# Patient Record
Sex: Male | Born: 1950 | Race: White | Hispanic: No | Marital: Married | Smoking: Former smoker
Health system: Southern US, Community
[De-identification: ages and names within clinical notes are randomized; demographics above are authoritative.]

## PROBLEM LIST (undated history)

## (undated) DIAGNOSIS — Z86010 Personal history of colonic polyps: Secondary | ICD-10-CM

## (undated) DIAGNOSIS — E785 Hyperlipidemia, unspecified: Secondary | ICD-10-CM

## (undated) DIAGNOSIS — N281 Cyst of kidney, acquired: Secondary | ICD-10-CM

## (undated) DIAGNOSIS — M1990 Unspecified osteoarthritis, unspecified site: Secondary | ICD-10-CM

## (undated) DIAGNOSIS — N401 Enlarged prostate with lower urinary tract symptoms: Secondary | ICD-10-CM

## (undated) DIAGNOSIS — R0600 Dyspnea, unspecified: Secondary | ICD-10-CM

## (undated) DIAGNOSIS — F329 Major depressive disorder, single episode, unspecified: Secondary | ICD-10-CM

## (undated) DIAGNOSIS — Q681 Congenital deformity of finger(s) and hand: Secondary | ICD-10-CM

## (undated) DIAGNOSIS — F32A Depression, unspecified: Secondary | ICD-10-CM

## (undated) DIAGNOSIS — N138 Other obstructive and reflux uropathy: Secondary | ICD-10-CM

## (undated) DIAGNOSIS — R3914 Feeling of incomplete bladder emptying: Secondary | ICD-10-CM

## (undated) DIAGNOSIS — J449 Chronic obstructive pulmonary disease, unspecified: Secondary | ICD-10-CM

## (undated) DIAGNOSIS — K5140 Inflammatory polyps of colon without complications: Secondary | ICD-10-CM

## (undated) DIAGNOSIS — Z973 Presence of spectacles and contact lenses: Secondary | ICD-10-CM

## (undated) DIAGNOSIS — F419 Anxiety disorder, unspecified: Secondary | ICD-10-CM

## (undated) DIAGNOSIS — R319 Hematuria, unspecified: Secondary | ICD-10-CM

## (undated) DIAGNOSIS — N183 Chronic kidney disease, stage 3 unspecified: Secondary | ICD-10-CM

## (undated) DIAGNOSIS — D649 Anemia, unspecified: Secondary | ICD-10-CM

## (undated) DIAGNOSIS — I10 Essential (primary) hypertension: Secondary | ICD-10-CM

## (undated) DIAGNOSIS — Z860101 Personal history of adenomatous and serrated colon polyps: Secondary | ICD-10-CM

## (undated) DIAGNOSIS — C679 Malignant neoplasm of bladder, unspecified: Secondary | ICD-10-CM

## (undated) HISTORY — DX: Depression, unspecified: F32.A

## (undated) HISTORY — DX: Benign prostatic hyperplasia with lower urinary tract symptoms: N13.8

## (undated) HISTORY — PX: NO PAST SURGERIES: SHX2092

## (undated) HISTORY — DX: Hyperlipidemia, unspecified: E78.5

## (undated) HISTORY — PX: COLONOSCOPY WITH PROPOFOL: SHX5780

## (undated) HISTORY — DX: Benign prostatic hyperplasia with lower urinary tract symptoms: N40.1

## (undated) HISTORY — DX: Anxiety disorder, unspecified: F41.9

## (undated) HISTORY — DX: Inflammatory polyps of colon without complications: K51.40

## (undated) HISTORY — DX: Major depressive disorder, single episode, unspecified: F32.9

## (undated) HISTORY — DX: Essential (primary) hypertension: I10

---

## 1998-09-09 ENCOUNTER — Emergency Department (HOSPITAL_COMMUNITY): Admission: EM | Admit: 1998-09-09 | Discharge: 1998-09-09 | Payer: Self-pay | Admitting: Emergency Medicine

## 2004-10-16 ENCOUNTER — Ambulatory Visit: Payer: Self-pay | Admitting: Family Medicine

## 2004-12-17 ENCOUNTER — Ambulatory Visit: Payer: Self-pay | Admitting: Family Medicine

## 2004-12-19 ENCOUNTER — Ambulatory Visit: Payer: Self-pay | Admitting: Internal Medicine

## 2005-01-08 ENCOUNTER — Ambulatory Visit: Payer: Self-pay | Admitting: Internal Medicine

## 2005-01-17 ENCOUNTER — Ambulatory Visit: Payer: Self-pay | Admitting: Internal Medicine

## 2005-01-18 ENCOUNTER — Ambulatory Visit: Payer: Self-pay | Admitting: Internal Medicine

## 2005-02-21 ENCOUNTER — Ambulatory Visit: Payer: Self-pay | Admitting: Family Medicine

## 2016-11-08 ENCOUNTER — Ambulatory Visit: Payer: Self-pay | Admitting: Adult Health

## 2016-11-15 ENCOUNTER — Encounter: Payer: Self-pay | Admitting: Internal Medicine

## 2016-11-15 ENCOUNTER — Encounter: Payer: Self-pay | Admitting: Adult Health

## 2016-11-15 ENCOUNTER — Ambulatory Visit (INDEPENDENT_AMBULATORY_CARE_PROVIDER_SITE_OTHER): Payer: Medicare Other | Admitting: Adult Health

## 2016-11-15 VITALS — Temp 98.6°F | Wt 130.7 lb

## 2016-11-15 DIAGNOSIS — L918 Other hypertrophic disorders of the skin: Secondary | ICD-10-CM

## 2016-11-15 DIAGNOSIS — I10 Essential (primary) hypertension: Secondary | ICD-10-CM | POA: Diagnosis not present

## 2016-11-15 DIAGNOSIS — Z23 Encounter for immunization: Secondary | ICD-10-CM | POA: Diagnosis not present

## 2016-11-15 DIAGNOSIS — F17210 Nicotine dependence, cigarettes, uncomplicated: Secondary | ICD-10-CM

## 2016-11-15 DIAGNOSIS — Z1211 Encounter for screening for malignant neoplasm of colon: Secondary | ICD-10-CM

## 2016-11-15 DIAGNOSIS — N400 Enlarged prostate without lower urinary tract symptoms: Secondary | ICD-10-CM | POA: Diagnosis not present

## 2016-11-15 DIAGNOSIS — Z7689 Persons encountering health services in other specified circumstances: Secondary | ICD-10-CM | POA: Diagnosis not present

## 2016-11-15 DIAGNOSIS — Z720 Tobacco use: Secondary | ICD-10-CM | POA: Insufficient documentation

## 2016-11-15 MED ORDER — VARENICLINE TARTRATE 1 MG PO TABS: 1.0000 mg | ORAL_TABLET | Freq: Two times a day (BID) | ORAL | 3 refills | Status: DC

## 2016-11-15 MED ORDER — TAMSULOSIN HCL 0.4 MG PO CAPS: 0.4000 mg | ORAL_CAPSULE | Freq: Every day | ORAL | 3 refills | Status: DC

## 2016-11-15 MED ORDER — FLUTICASONE FUROATE-VILANTEROL 100-25 MCG/INH IN AEPB: 1.0000 | INHALATION_SPRAY | Freq: Every day | RESPIRATORY_TRACT | 11 refills | Status: DC

## 2016-11-15 MED ORDER — LISINOPRIL 20 MG PO TABS: 20.0000 mg | ORAL_TABLET | Freq: Every day | ORAL | 3 refills | Status: DC

## 2016-11-15 MED ORDER — VARENICLINE TARTRATE 0.5 MG X 11 & 1 MG X 42 PO MISC: ORAL | 0 refills | Status: DC

## 2016-11-15 NOTE — Progress Notes (Addendum)
Patient presents to clinic today to establish care. He is a pleasant 66 year old male who  has a past medical history of Depression and Inflammatory polyps of colon ([place]).  He has not been seen by a medical profession for the last 12 years  Acute Concerns: Establish Care   He has a skin tag on his right index finger that he would like removed as it is irritated when he is dressing, bathing and working   Chronic Issues: Depression - he has never taken anything for depression. He does not feel like it is well controlled. He reports that he does not care about the things he used to care about it. He denies any SI.   LUTS associated with BPH - feels as though he is getting up to go the bathroom throughout the night. His stream is not strong and he is having incontinence    Hypertension - He has never been diagnosed with hypertension in the past. Blood pressure is 172/92 on two separate checkes today   Tobacco Use - He smokes about 1/4 pack per day. He is interested in using Chantix. He also complains of feeling short of breath due to smoking. Shortness of breath is more pronounced at night   Health Maintenance: Dental -- Does not do routine care  Vision -- Does not do routine care  Immunizations -- Not UTD  Colonoscopy -- 2006     Past Medical History:  Diagnosis Date  . Depression   . Inflammatory polyps of colon ([place])     No past surgical history on file.  No current outpatient prescriptions on file prior to visit.   No current facility-administered medications on file prior to visit.     Allergies  Allergen Reactions  . Penicillins     Family History  Problem Relation Age of Onset  . Hypertension Mother   . Stroke Mother   . Heart disease Father   . Cancer Sister   . Cancer Brother     Social History   Social History  . Marital status: Married    Spouse name: N/A  . Number of children: N/A  . Years of education: N/A   Occupational History  . Not on  file.   Social History Main Topics  . Smoking status: Current Every Day Smoker  . Smokeless tobacco: Never Used  . Alcohol use No  . Drug use: No  . Sexual activity: Not on file   Other Topics Concern  . Not on file   Social History Narrative  . No narrative on file    Review of Systems  Constitutional: Negative.   HENT: Negative.   Eyes: Negative.   Respiratory: Positive for shortness of breath and wheezing.   Cardiovascular: Negative.   Gastrointestinal: Negative.   Genitourinary: Positive for urgency.       Incontinence and decreased stream    Musculoskeletal: Negative.   Skin:       Skin tag on right index finger   Neurological: Negative.   Endo/Heme/Allergies: Negative.   Psychiatric/Behavioral: Positive for depression.    Temp 98.6 F (37 C) (Oral)   Wt 130 lb 11.2 oz (59.3 kg)   Physical Exam  Constitutional: He is oriented to person, place, and time and well-developed, well-nourished, and in no distress. No distress.  HENT:  Head: Normocephalic and atraumatic.  Right Ear: External ear normal.  Left Ear: External ear normal.  Nose: Nose normal.  Mouth/Throat: Oropharynx is clear and moist.  No oropharyngeal exudate.  Eyes: Conjunctivae and EOM are normal. Pupils are equal, round, and reactive to light. Right eye exhibits no discharge. Left eye exhibits no discharge. No scleral icterus.  Neck: Normal range of motion. Neck supple. No JVD present. No tracheal deviation present. No thyromegaly present.  Cardiovascular: Normal rate, regular rhythm, normal heart sounds and intact distal pulses.  Exam reveals no gallop and no friction rub.   No murmur heard. Pulmonary/Chest: Effort normal. No stridor. No respiratory distress. He has wheezes (trace throughout ). He has no rales. He exhibits no tenderness.  Congenital deformity of chest wall   Abdominal: Soft. Bowel sounds are normal. He exhibits no distension and no mass. There is no tenderness. There is no rebound  and no guarding.  Musculoskeletal: Normal range of motion. He exhibits no edema, tenderness or deformity.  Congenital deformity of left hand   Lymphadenopathy:    He has no cervical adenopathy.  Neurological: He is alert and oriented to person, place, and time. He displays normal reflexes. No cranial nerve deficit. He exhibits normal muscle tone. Coordination normal. GCS score is 15.  Skin: Skin is warm and dry. No rash noted. He is not diaphoretic. No erythema. No pallor.  Skin tag on right index finger.   Psychiatric: Mood, memory, affect and judgment normal.  Nursing note and vitals reviewed.  Assessment/Plan: 1. Encounter to establish care - Follow up in one month for CPE  - Follow up sooner if needed  2. Cigarette nicotine dependence without complication  - varenicline (CHANTIX CONTINUING MONTH PAK) 1 MG tablet; Take 1 tablet (1 mg total) by mouth 2 (two) times daily.  Dispense: 60 tablet; Refill: 3 - varenicline (CHANTIX STARTING MONTH PAK) 0.5 MG X 11 & 1 MG X 42 tablet; Take one 0.5 mg tablet by mouth once daily for 3 days, then increase to one 0.5 mg tablet twice daily for 4 days, then increase to one 1 mg tablet twice daily.  Dispense: 53 tablet; Refill: 0 Trial of chantix. Common side effects including rare risk of suicide ideation was discussed with the patient today.  Patient is instructed to go directly to the ED if this occurs.  We discussed that patient can continue to smoke for 1 week after starting chantix, but then must discontinue cigarettes.  He is also instructed to contact us prior to completion of the starter month pack for an rx for the continuation month pack.  5 minutes spent with patient today on tobacco cessation counseling.   - fluticasone furoate-vilanterol (BREO ELLIPTA) 100-25 MCG/INH AEPB; Inhale 1 puff into the lungs daily.  Dispense: 28 each; Refill: 11  3. Colon cancer screening  - Ambulatory referral to Gastroenterology  4. Essential hypertension -  Will start on Lisinopril 20 mg daily - lisinopril (PRINIVIL,ZESTRIL) 20 MG tablet; Take 1 tablet (20 mg total) by mouth daily.  Dispense: 90 tablet; Refill: 3 - Follow up in one month  5. Skin tag  0.4 mm Skin tag was  snipped off using Betadine for cleansing and sterile iris scissors. Local anesthesia was  used. These pathognomonic lesions are not sent for pathology.   6. Need for vaccination against Streptococcus pneumoniae using pneumococcal conjugate vaccine 13  - Pneumococcal conjugate vaccine 13-valent IM  7. BPH without urinary obstruction  - tamsulosin (FLOMAX) 0.4 MG CAPS capsule; Take 1 capsule (0.4 mg total) by mouth daily.  Dispense: 90 capsule; Refill: 3   Dorothyann Peng, NP

## 2016-11-15 NOTE — Patient Instructions (Addendum)
It was great meeting you today   1. I have sent in a prescription for lisinopril - for blood pressure. Take daily  2. I have sent in a prescription for Chantix - take this daily as directed 3. I have sent in a prescription for Breo Ellipta - use daily for shortness of breath   Please follow up with me in 1 month for your physical

## 2016-12-17 ENCOUNTER — Encounter: Payer: Self-pay | Admitting: Adult Health

## 2016-12-17 ENCOUNTER — Ambulatory Visit (INDEPENDENT_AMBULATORY_CARE_PROVIDER_SITE_OTHER): Payer: Medicare Other | Admitting: Adult Health

## 2016-12-17 ENCOUNTER — Other Ambulatory Visit: Payer: Self-pay | Admitting: Family Medicine

## 2016-12-17 VITALS — BP 164/74 | Temp 98.0°F | Ht 64.0 in | Wt 131.0 lb

## 2016-12-17 DIAGNOSIS — F32A Depression, unspecified: Secondary | ICD-10-CM | POA: Insufficient documentation

## 2016-12-17 DIAGNOSIS — Z0001 Encounter for general adult medical examination with abnormal findings: Secondary | ICD-10-CM | POA: Diagnosis not present

## 2016-12-17 DIAGNOSIS — Z125 Encounter for screening for malignant neoplasm of prostate: Secondary | ICD-10-CM

## 2016-12-17 DIAGNOSIS — I10 Essential (primary) hypertension: Secondary | ICD-10-CM

## 2016-12-17 DIAGNOSIS — Z0000 Encounter for general adult medical examination without abnormal findings: Secondary | ICD-10-CM

## 2016-12-17 DIAGNOSIS — Z720 Tobacco use: Secondary | ICD-10-CM

## 2016-12-17 DIAGNOSIS — N400 Enlarged prostate without lower urinary tract symptoms: Secondary | ICD-10-CM | POA: Diagnosis not present

## 2016-12-17 DIAGNOSIS — Z1159 Encounter for screening for other viral diseases: Secondary | ICD-10-CM | POA: Diagnosis not present

## 2016-12-17 DIAGNOSIS — F329 Major depressive disorder, single episode, unspecified: Secondary | ICD-10-CM

## 2016-12-17 DIAGNOSIS — R0602 Shortness of breath: Secondary | ICD-10-CM

## 2016-12-17 LAB — HEPATIC FUNCTION PANEL
ALK PHOS: 100 U/L (ref 39–117)
ALT: 9 U/L (ref 0–53)
AST: 9 U/L (ref 0–37)
Albumin: 4.2 g/dL (ref 3.5–5.2)
BILIRUBIN DIRECT: 0.1 mg/dL (ref 0.0–0.3)
BILIRUBIN TOTAL: 0.4 mg/dL (ref 0.2–1.2)
TOTAL PROTEIN: 6.8 g/dL (ref 6.0–8.3)

## 2016-12-17 LAB — CBC WITH DIFFERENTIAL/PLATELET
BASOS PCT: 0.9 % (ref 0.0–3.0)
Basophils Absolute: 0.1 10*3/uL (ref 0.0–0.1)
Eosinophils Absolute: 0.2 10*3/uL (ref 0.0–0.7)
Eosinophils Relative: 2.3 % (ref 0.0–5.0)
HEMATOCRIT: 47.7 % (ref 39.0–52.0)
Hemoglobin: 16 g/dL (ref 13.0–17.0)
LYMPHS PCT: 21.6 % (ref 12.0–46.0)
Lymphs Abs: 1.8 10*3/uL (ref 0.7–4.0)
MCHC: 33.5 g/dL (ref 30.0–36.0)
MCV: 88.6 fl (ref 78.0–100.0)
MONOS PCT: 7.6 % (ref 3.0–12.0)
Monocytes Absolute: 0.6 10*3/uL (ref 0.1–1.0)
NEUTROS ABS: 5.7 10*3/uL (ref 1.4–7.7)
Neutrophils Relative %: 67.6 % (ref 43.0–77.0)
Platelets: 403 10*3/uL — ABNORMAL HIGH (ref 150.0–400.0)
RBC: 5.39 Mil/uL (ref 4.22–5.81)
RDW: 15.6 % — ABNORMAL HIGH (ref 11.5–15.5)
WBC: 8.5 10*3/uL (ref 4.0–10.5)

## 2016-12-17 LAB — PSA: PSA: 3.01 ng/mL (ref 0.10–4.00)

## 2016-12-17 LAB — LIPID PANEL
CHOL/HDL RATIO: 5
Cholesterol: 239 mg/dL — ABNORMAL HIGH (ref 0–200)
HDL: 47 mg/dL (ref >39.00)
LDL CALC: 175 mg/dL — AB (ref 0–99)
NONHDL: 191.64
Triglycerides: 82 mg/dL (ref 0.0–149.0)
VLDL: 16.4 mg/dL (ref 0.0–40.0)

## 2016-12-17 LAB — BASIC METABOLIC PANEL
BUN: 23 mg/dL (ref 6–23)
CHLORIDE: 102 meq/L (ref 96–112)
CO2: 28 meq/L (ref 19–32)
Calcium: 9.6 mg/dL (ref 8.4–10.5)
Creatinine, Ser: 0.92 mg/dL (ref 0.40–1.50)
GFR: 87.23 mL/min (ref >60.00)
Glucose, Bld: 105 mg/dL — ABNORMAL HIGH (ref 70–99)
Potassium: 5 mEq/L (ref 3.5–5.1)
SODIUM: 137 meq/L (ref 135–145)

## 2016-12-17 LAB — TSH: TSH: 2.06 u[IU]/mL (ref 0.35–4.50)

## 2016-12-17 LAB — HEMOGLOBIN A1C: Hgb A1c MFr Bld: 6 % (ref 4.6–6.5)

## 2016-12-17 MED ORDER — ATORVASTATIN CALCIUM 20 MG PO TABS: 20.0000 mg | ORAL_TABLET | Freq: Every day | ORAL | 3 refills | Status: DC

## 2016-12-17 MED ORDER — BUPROPION HCL ER (SR) 150 MG PO TB12: 150.0000 mg | ORAL_TABLET | Freq: Two times a day (BID) | ORAL | 1 refills | Status: DC

## 2016-12-17 MED ORDER — TIOTROPIUM BROMIDE MONOHYDRATE 18 MCG IN CAPS: 18.0000 ug | ORAL_CAPSULE | Freq: Every day | RESPIRATORY_TRACT | 12 refills | Status: DC

## 2016-12-17 NOTE — Telephone Encounter (Signed)
Pt notified of results by telephone.  Agreed to start medication.  Per [name], send in Lipitor 20 mg to take 1 po qd.  Sent to the pharmacy by e-scribe.

## 2016-12-17 NOTE — Progress Notes (Signed)
Subjective:    Patient ID: Samuel [name], male    DOB: Jun 13, 1950, 66 y.o.   MRN: [id number]  HPI  Patient presents for yearly preventative medicine examination. 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  All immunizations and health maintenance protocols were reviewed with the patient and needed orders were placed.  Appropriate screening laboratory values were ordered for the patient including screening of hyperlipidemia, renal function and hepatic function. If indicated by BPH, a PSA was ordered.  Medication reconciliation,  past medical history, social history, problem list and allergies were reviewed in detail with the patient  Goals were established with regard to weight loss, exercise, and  diet in compliance with medications  He has an upcoming appointment in September for his colonoscopy. He does not do routine dental or vision care.   During his establish care visit it was learned that he was not seen by a medical professional for 12 years.   Hypertension - He had never been diagnosed in the past, blood pressure readings in the 209'O systolic in the office. He was started on Lisinopril 20 mg. Today in the office his blood pressure is 164/74. He took his medications   Tobacco Use - He was interested in quitting smoking during his initial visit. He was started on Chantix and today he reports that the medication was too expensive for him. He continues smoke a pack every three days.   Depression - He reported that in his last visit that he had suffered from depression and had never been prescribed any medication in the past - he did endorse that he felt as though he did not enjoy things that he once did. He was not started on any medications during his initial visit.     Review of Systems  Constitutional: Negative.     HENT: Negative.   Eyes: Negative.   Respiratory: Negative.   Cardiovascular: Negative.   Gastrointestinal: Negative.   Endocrine: Negative.   Genitourinary: Negative.   Musculoskeletal: Negative.   Skin: Negative.   Allergic/Immunologic: Negative.   Neurological: Negative.   Hematological: Negative.   Psychiatric/Behavioral: Negative.   All other systems reviewed and are negative.  Past Medical History:  Diagnosis Date  . BPH with obstruction/lower urinary tract symptoms   . Depression   . Essential hypertension   . Inflammatory polyps of colon [facility])     Social History   Social History  . Marital status: Married    Spouse name: N/A  . Number of children: N/A  . Years of education: N/A   Occupational History  . Not on file.   Social History Main Topics  . Smoking status: Current Every Day Smoker  . Smokeless tobacco: Never Used  . Alcohol use No  . Drug use: No  . Sexual activity: Not on file   Other Topics Concern  . Not on file   Social History Narrative   Retired    Married       He watches television    No past surgical history on file.  Family History  Problem Relation Age of Onset  . Hypertension Mother   . Stroke Mother   . Heart disease Father   . Breast cancer Sister   . Prostate cancer Brother   . Hypertension Sister     Allergies  Allergen Reactions  . Penicillins  Current Outpatient Prescriptions on File Prior to Visit  Medication Sig Dispense Refill  . lisinopril (PRINIVIL,ZESTRIL) 20 MG tablet Take 1 tablet (20 mg total) by mouth daily. 90 tablet 3  . tamsulosin (FLOMAX) 0.4 MG CAPS capsule Take 1 capsule (0.4 mg total) by mouth daily. 90 capsule 3  . fluticasone furoate-vilanterol (BREO ELLIPTA) 100-25 MCG/INH AEPB Inhale 1 puff into the lungs daily. (Patient not taking: Reported on 12/17/2016) 28 each 11   No current facility-administered medications on file prior to visit.     BP (!) 164/74 (BP Location: Right Arm)    Temp 98 F (36.7 C) (Oral)   Ht 5\' 4"  (1.626 m)   Wt 131 lb (59.4 kg)   BMI 22.49 kg/m       Objective:   Physical Exam  Constitutional: He is oriented to person, place, and time. He appears well-developed and well-nourished. No distress.  HENT:  Head: Normocephalic and atraumatic.  Right Ear: External ear normal.  Left Ear: External ear normal.  Nose: Nose normal.  Mouth/Throat: Oropharynx is clear and moist. No oropharyngeal exudate.  Eyes: Pupils are equal, round, and reactive to light. Conjunctivae and EOM are normal. Right eye exhibits no discharge. Left eye exhibits no discharge. No scleral icterus.  Neck: Normal range of motion. Neck supple. No JVD present. Carotid bruit is not present. No tracheal deviation present. No thyromegaly present.  Cardiovascular: Normal rate, regular rhythm, normal heart sounds and intact distal pulses.  Exam reveals no gallop and no friction rub.   No murmur heard. Pulmonary/Chest: Effort normal. No stridor. No respiratory distress. He has wheezes (throughout lung fields ). He has no rales. He exhibits no tenderness.  Congenital deformity of chest wall    Abdominal: Soft. Bowel sounds are normal. He exhibits no distension and no mass. There is no tenderness. There is no rebound and no guarding.  Musculoskeletal: Normal range of motion. He exhibits no edema, tenderness or deformity.  Congenital deformity of left hand  Lymphadenopathy:    He has no cervical adenopathy.  Neurological: He is alert and oriented to person, place, and time. He has normal reflexes. No cranial nerve deficit. Coordination normal.  Skin: Skin is warm and dry. No rash noted. He is not diaphoretic. No erythema. No pallor.  Psychiatric: He has a normal mood and affect. His behavior is normal. Judgment and thought content normal.  Nursing note and vitals reviewed.     Assessment & Plan:  1. Routine general medical examination at a health care facility  - Basic metabolic  panel - CBC with Differential/Platelet - Hemoglobin A1c - Hepatic function panel - Lipid panel - PSA - TSH  2. Depression, unspecified depression type - Will start on Wellbutrin.  - Follow up in one month  - Advised to go to the ER with any thoughts of suicide or self harm  - buPROPion (WELLBUTRIN SR) 150 MG 12 hr tablet; Take 1 tablet (150 mg total) by mouth 2 (two) times daily.  Dispense: 180 tablet; Refill: 1  3. Essential hypertension - Unknown if lisinopril is working for him. He took his meds prior to arrival and has not been monitoring at home.  - Advised to check BP at home and report readings to me  - Follow up in one month  - Basic metabolic panel - CBC with Differential/Platelet - Hemoglobin A1c - Hepatic function panel - Lipid panel - PSA - TSH  4. BPH without urinary obstruction  - Basic metabolic panel -  CBC with Differential/Platelet - Hemoglobin A1c - Hepatic function panel - Lipid panel - PSA - TSH  5. Tobacco use - Chantix was too expensive. Will start on Wellbutrin.  - Follow up in one month  - Basic metabolic panel - CBC with Differential/Platelet - Hemoglobin A1c - Hepatic function panel - Lipid panel - PSA - TSH - buPROPion (WELLBUTRIN SR) 150 MG 12 hr tablet; Take 1 tablet (150 mg total) by mouth 2 (two) times daily.  Dispense: 180 tablet; Refill: 1  6. Need for hepatitis C screening test  - Hep C Antibody  7. 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  Dispense: 30 capsule; Refill: [place], NP

## 2016-12-17 NOTE — Patient Instructions (Signed)
It was great seeing you today   I have sent in [place] to help with wheezing and shortness of breath   I have also sent in Wellbutrin to help with depression and quitting smoking.   Please let me know if these medications are too expensive   Monitor your blood pressure 3-4 times per week at various times and send me the results via mychart.   Please follow up in one month

## 2016-12-18 ENCOUNTER — Other Ambulatory Visit: Payer: Self-pay | Admitting: Adult Health

## 2016-12-18 ENCOUNTER — Encounter: Payer: Self-pay | Admitting: Adult Health

## 2016-12-18 LAB — HEPATITIS C ANTIBODY: HCV Ab: NONREACTIVE

## 2016-12-23 ENCOUNTER — Encounter: Payer: Self-pay | Admitting: Adult Health

## 2017-01-02 ENCOUNTER — Encounter: Payer: Self-pay | Admitting: Adult Health

## 2017-01-07 ENCOUNTER — Other Ambulatory Visit: Payer: Self-pay | Admitting: Family Medicine

## 2017-01-07 ENCOUNTER — Encounter: Payer: Self-pay | Admitting: Adult Health

## 2017-01-17 ENCOUNTER — Ambulatory Visit (INDEPENDENT_AMBULATORY_CARE_PROVIDER_SITE_OTHER): Payer: Medicare Other | Admitting: Adult Health

## 2017-01-17 ENCOUNTER — Encounter: Payer: Self-pay | Admitting: Adult Health

## 2017-01-17 VITALS — BP 130/70 | Temp 98.1°F | Ht 64.0 in | Wt 129.0 lb

## 2017-01-17 DIAGNOSIS — F329 Major depressive disorder, single episode, unspecified: Secondary | ICD-10-CM | POA: Diagnosis not present

## 2017-01-17 DIAGNOSIS — F32A Depression, unspecified: Secondary | ICD-10-CM

## 2017-01-17 DIAGNOSIS — I10 Essential (primary) hypertension: Secondary | ICD-10-CM | POA: Diagnosis not present

## 2017-01-17 DIAGNOSIS — Z720 Tobacco use: Secondary | ICD-10-CM | POA: Diagnosis not present

## 2017-01-17 NOTE — Progress Notes (Signed)
Subjective:    Patient ID: Samuel [name], male    DOB: 1950-05-16, 66 y.o.   MRN: [id number]  HPI  66 year old male who  has a past medical history of BPH with obstruction/lower urinary tract symptoms; Depression; Essential hypertension; and Inflammatory polyps of colon ([place]). He presents to the office today for one month follow up regarding hypertension. During his CPE ( one month ago) he had taken his anti hypertension, lisinopril 20 mg PTA. During this office visit his BP was 164/74. He was not monitoring at home. He aws asked to monitor his BP and report back to me in one month.   Today in the office he reports that he has not been checking his blood pressure at home but has not felt as though he has had any dizziness, lightheadedness, or headaches.   He did have issues with taking Wellbutrin BID, he reports that he felt more anxious and " shaky". He went down to one pill daily and his symptoms resolved. He feels as though his depression has improved and he no longer " has the taste for cigarettes". He has not had a cigarette since yesterday early afternoon.   Review of Systems See HPI   Past Medical History:  Diagnosis Date  . BPH with obstruction/lower urinary tract symptoms   . Depression   . Essential hypertension   . Inflammatory polyps of colon [facility])     Social History   Social History  . Marital status: Married    Spouse name: N/A  . Number of children: N/A  . Years of education: N/A   Occupational History  . Not on file.   Social History Main Topics  . Smoking status: Current Every Day Smoker  . Smokeless tobacco: Never Used  . Alcohol use No  . Drug use: No  . Sexual activity: Not on file   Other Topics Concern  . Not on file   Social History Narrative   Retired    Married       He watches television    No past surgical history on file.  Family History  Problem Relation Age of Onset  . Hypertension Mother   . Stroke Mother   . Heart  disease Father   . Breast cancer Sister   . Prostate cancer Brother   . Hypertension Sister     Allergies  Allergen Reactions  . Penicillins     Current Outpatient Prescriptions on File Prior to Visit  Medication Sig Dispense Refill  . atorvastatin (LIPITOR) 20 MG tablet Take 1 tablet (20 mg total) by mouth daily. 90 tablet 3  . buPROPion (WELLBUTRIN SR) 150 MG 12 hr tablet Take 1 tablet (150 mg total) by mouth 2 (two) times daily. (Patient taking differently: Take 150 mg by mouth daily. ) 180 tablet 1  . lisinopril (PRINIVIL,ZESTRIL) 20 MG tablet Take 1 tablet (20 mg total) by mouth daily. 90 tablet 3  . tamsulosin (FLOMAX) 0.4 MG CAPS capsule Take 1 capsule (0.4 mg total) by mouth daily. 90 capsule 3   No current facility-administered medications on file prior to visit.     BP 130/70 (BP Location: Right Arm)   Temp 98.1 F (36.7 C) (Oral)   Ht 5\' 4"  (1.626 m)   Wt 129 lb (58.5 kg)   BMI 22.14 kg/m       Objective:   Physical Exam  Constitutional: He is oriented to person, place, and time. He appears well-developed and well-nourished. No  distress.  Cardiovascular: Normal rate, regular rhythm, normal heart sounds and intact distal pulses.  Exam reveals no gallop and no friction rub.   No murmur heard. Pulmonary/Chest: Effort normal and breath sounds normal. No respiratory distress. He has no wheezes. He has no rales. He exhibits no tenderness.  Neurological: He is alert and oriented to person, place, and time.  Skin: Skin is warm and dry. No rash noted. He is not diaphoretic. No erythema. No pallor.  Psychiatric: He has a normal mood and affect. His behavior is normal. Judgment and thought content normal.  Nursing note and vitals reviewed.     Assessment & Plan:  1. Essential hypertension - Near goal today  - No change in medication   2. Depression, unspecified depression type - Has improved. Will keep on Wellbutrin 150 mg daily   3. Tobacco use - Continue to work  on quitting smoking . - Follow up as needed   Dorothyann Peng, NP

## 2017-01-21 ENCOUNTER — Encounter: Payer: Self-pay | Admitting: Internal Medicine

## 2017-01-21 ENCOUNTER — Ambulatory Visit (AMBULATORY_SURGERY_CENTER): Payer: Self-pay

## 2017-01-21 VITALS — Ht 64.0 in | Wt 130.0 lb

## 2017-01-21 DIAGNOSIS — Z86010 Personal history of colonic polyps: Secondary | ICD-10-CM

## 2017-01-21 MED ORDER — NA SULFATE-K SULFATE-MG SULF 17.5-3.13-1.6 GM/177ML PO SOLN: 1.0000 | Freq: Once | ORAL | 0 refills | Status: AC

## 2017-01-21 NOTE — Progress Notes (Signed)
Denies allergies to eggs or soy products. Denies complication of anesthesia or sedation. Denies use of weight loss medication. Denies use of O2.   Emmi instructions given for colonoscopy.  

## 2017-01-31 ENCOUNTER — Encounter: Payer: Self-pay | Admitting: Adult Health

## 2017-02-04 ENCOUNTER — Ambulatory Visit (AMBULATORY_SURGERY_CENTER): Payer: Medicare Other | Admitting: Internal Medicine

## 2017-02-04 ENCOUNTER — Encounter: Payer: Self-pay | Admitting: Internal Medicine

## 2017-02-04 VITALS — BP 137/86 | HR 81 | Temp 98.9°F | Resp 17 | Ht 64.0 in | Wt 130.0 lb

## 2017-02-04 DIAGNOSIS — Z86010 Personal history of colonic polyps: Secondary | ICD-10-CM | POA: Diagnosis present

## 2017-02-04 MED ORDER — SODIUM CHLORIDE 0.9 % IV SOLN: 500.0000 mL | INTRAVENOUS | Status: DC

## 2017-02-04 NOTE — Op Note (Addendum)
[facility] Patient Name: Samuel [name] Procedure Date: 02/04/2017 11:31 AM MRN: [id number] Endoscopist: Docia Chuck. Henrene Pastor , MD Age: 66 Referring MD:  Date of Birth: 07/07/1950 Gender: Male Account #: [id number] Procedure:                Colonoscopy Indications:              High risk colon cancer surveillance: Personal                            history of adenoma (10 mm or greater in size), High                            risk colon cancer surveillance: Personal history of                            adenoma with villous component, High risk colon                            cancer surveillance: Personal history of multiple                            (3 or more) adenomas. Index exam 12-2004. Well                            overdue for follow-up Medicines:                Monitored Anesthesia Care Procedure:                Pre-Anesthesia Assessment:                           - Prior to the procedure, a History and Physical                            was performed, and patient medications and                            allergies were reviewed. The patient's tolerance of                            previous anesthesia was also reviewed. The risks                            and benefits of the procedure and the sedation                            options and risks were discussed with the patient.                            All questions were answered, and informed consent                            was obtained. Prior Anticoagulants: The patient has  taken no previous anticoagulant or antiplatelet                            agents. ASA Grade Assessment: II - A patient with                            mild systemic disease. After reviewing the risks                            and benefits, the patient was deemed in                            satisfactory condition to undergo the procedure.                           After obtaining informed consent, the  colonoscope                            was passed under direct vision. Throughout the                            procedure, the patient's blood pressure, pulse, and                            oxygen saturations were monitored continuously. The                            Colonoscope was introduced through the anus and                            advanced to the the cecum, identified by                            appendiceal orifice and ileocecal valve. The                            ileocecal valve, appendiceal orifice, and rectum                            were photographed. The quality of the bowel                            preparation was excellent. The colonoscopy was                            performed without difficulty. The patient tolerated                            the procedure well. The bowel preparation used was                            SUPREP. Scope In: 11:42:14 AM Scope Out: 11:58:59 AM Scope Withdrawal Time: 0 hours 11 minutes 58 seconds  Total Procedure Duration: 0 hours 16 minutes 45  seconds  Findings:                 Multiple diverticula were found in the sigmoid                            colon.                           Internal hemorrhoids were found during retroflexion.                           The exam was otherwise without abnormality on                            direct and retroflexion views. Complications:            No immediate complications. Estimated blood loss:                            None. Estimated Blood Loss:     Estimated blood loss: none. Impression:               - Diverticulosis in the sigmoid colon.                           - Internal hemorrhoids.                           - The examination was otherwise normal on direct                            and retroflexion views.                           - No specimens collected. Recommendation:           - Repeat colonoscopy in 5 years for surveillance.                           - Patient has a  contact number available for                            emergencies. The signs and symptoms of potential                            delayed complications were discussed with the                            patient. Return to normal activities tomorrow.                            Written discharge instructions were provided to the                            patient.                           - Resume previous diet.                           -  Continue present medications. Docia Chuck. Henrene Pastor, MD 02/04/2017 12:03:49 PM This report has been signed electronically.

## 2017-02-04 NOTE — Patient Instructions (Signed)
**  Handouts given on diverticulosis and hemorrhoids**   YOU HAD AN ENDOSCOPIC PROCEDURE TODAY: Refer to the procedure report and other information in the discharge instructions given to you for any specific questions about what was found during the examination. If this information does not answer your questions, please call [place] office at [phone] to clarify.   YOU SHOULD EXPECT: Some feelings of bloating in the abdomen. Passage of more gas than usual. Walking can help get rid of the air that was put into your GI tract during the procedure and reduce the bloating. If you had a lower endoscopy (such as a colonoscopy or flexible sigmoidoscopy) you may notice spotting of blood in your stool or on the toilet paper. Some abdominal soreness may be present for a day or two, also.  DIET: Your first meal following the procedure should be a light meal and then it is ok to progress to your normal diet. A half-sandwich or bowl of soup is an example of a good first meal. Heavy or fried foods are harder to digest and may make you feel nauseous or bloated. Drink plenty of fluids but you should avoid alcoholic beverages for 24 hours. If you had a esophageal dilation, please see attached instructions for diet.    ACTIVITY: Your care partner should take you home directly after the procedure. You should plan to take it easy, moving slowly for the rest of the day. You can resume normal activity the day after the procedure however YOU SHOULD NOT DRIVE, use power tools, machinery or perform tasks that involve climbing or major physical exertion for 24 hours (because of the sedation medicines used during the test).   SYMPTOMS TO REPORT IMMEDIATELY: A gastroenterologist can be reached at any hour. Please call [phone]  for any of the following symptoms:  Following lower endoscopy (colonoscopy, flexible sigmoidoscopy) Excessive amounts of blood in the stool  Significant tenderness, worsening of abdominal pains   Swelling of the abdomen that is new, acute  Fever of 100 or higher    FOLLOW UP:  If any biopsies were taken you will be contacted by phone or by letter within the next 1-3 weeks. Call [phone]  if you have not heard about the biopsies in 3 weeks.  Please also call with any specific questions about appointments or follow up tests.  

## 2017-02-04 NOTE — Progress Notes (Signed)
Pt's states no medical or surgical changes since previsit or office visit. 

## 2017-02-04 NOTE — Progress Notes (Signed)
Report to PACU, RN, vss, BBS= Clear.  

## 2017-02-05 ENCOUNTER — Telehealth: Payer: Self-pay | Admitting: *Deleted

## 2017-02-05 NOTE — Telephone Encounter (Signed)
Message left

## 2017-02-27 ENCOUNTER — Ambulatory Visit (INDEPENDENT_AMBULATORY_CARE_PROVIDER_SITE_OTHER): Payer: Medicare Other | Admitting: *Deleted

## 2017-02-27 DIAGNOSIS — Z23 Encounter for immunization: Secondary | ICD-10-CM | POA: Diagnosis not present

## 2017-05-07 ENCOUNTER — Encounter: Payer: Self-pay | Admitting: Adult Health

## 2017-05-07 ENCOUNTER — Ambulatory Visit (INDEPENDENT_AMBULATORY_CARE_PROVIDER_SITE_OTHER): Payer: Medicare Other | Admitting: Adult Health

## 2017-05-07 VITALS — BP 130/60 | Temp 97.5°F | Wt 141.0 lb

## 2017-05-07 DIAGNOSIS — L659 Nonscarring hair loss, unspecified: Secondary | ICD-10-CM

## 2017-05-07 DIAGNOSIS — N401 Enlarged prostate with lower urinary tract symptoms: Secondary | ICD-10-CM | POA: Diagnosis not present

## 2017-05-07 DIAGNOSIS — R3914 Feeling of incomplete bladder emptying: Secondary | ICD-10-CM | POA: Diagnosis not present

## 2017-05-07 LAB — VITAMIN B12: Vitamin B-12: 587 pg/mL (ref 211–911)

## 2017-05-07 LAB — VITAMIN D 25 HYDROXY (VIT D DEFICIENCY, FRACTURES): VITD: 31.48 ng/mL (ref 30.00–100.00)

## 2017-05-07 MED ORDER — DUTASTERIDE 0.5 MG PO CAPS: 0.5000 mg | ORAL_CAPSULE | Freq: Every day | ORAL | 3 refills | Status: DC

## 2017-05-07 NOTE — Progress Notes (Signed)
Subjective:    Patient ID: Samuel [name], male    DOB: 1950-12-10, 66 y.o.   MRN: [id number]  HPI  66 year old male who  has a past medical history of Anxiety, BPH with obstruction/lower urinary tract symptoms, Depression, Essential hypertension, Hyperlipidemia, and Inflammatory polyps of colon ([place]).   He presents with his wife to this visit.   He reports that over the last month he is having to start urinating more frequently at night ( 3-4 times a night.), he is also experiencing frequency and incomplete bladder emptying with some "dribbling" as well as decreased stream.   He denies any hematuria or dysuria, nausea, vomiting, or diarrhea.   His wife reports that she feels as though his hair is thinning as well. He has not noticed this and has not noticed any increase of hair in the sink or shower.    Review of Systems See HPI   Past Medical History:  Diagnosis Date  . Anxiety   . BPH with obstruction/lower urinary tract symptoms   . Depression   . Essential hypertension   . Hyperlipidemia   . Inflammatory polyps of colon [facility])     Social History   Socioeconomic History  . Marital status: Married    Spouse name: Not on file  . Number of children: Not on file  . Years of education: Not on file  . Highest education level: Not on file  Social Needs  . Financial resource strain: Not on file  . Food insecurity - worry: Not on file  . Food insecurity - inability: Not on file  . Transportation needs - medical: Not on file  . Transportation needs - non-medical: Not on file  Occupational History  . Not on file  Tobacco Use  . Smoking status: Former Smoker    Last attempt to quit: 01/16/2017    Years since quitting: 0.3  . Smokeless tobacco: Never Used  . Tobacco comment: Trying to quit.   Substance and Sexual Activity  . Alcohol use: No  . Drug use: No  . Sexual activity: Not on file  Other Topics Concern  . Not on file  Social History Narrative   Retired    Married       He watches television    History reviewed. No pertinent surgical history.  Family History  Problem Relation Age of Onset  . Hypertension Mother   . Stroke Mother   . Heart disease Father   . Breast cancer Sister   . Prostate cancer Brother   . Hypertension Sister   . Colon cancer Neg Hx   . Esophageal cancer Neg Hx   . Pancreatic cancer Neg Hx   . Rectal cancer Neg Hx   . Stomach cancer Neg Hx     Allergies  Allergen Reactions  . Penicillins     Current Outpatient Medications on File Prior to Visit  Medication Sig Dispense Refill  . atorvastatin (LIPITOR) 20 MG tablet Take 1 tablet (20 mg total) by mouth daily. 90 tablet 3  . BREO ELLIPTA 100-25 MCG/INH AEPB INL 1 PUFF ITL D  11  . ibuprofen (ADVIL,MOTRIN) 100 MG/5ML suspension Take 200 mg by mouth every 4 (four) hours as needed.    Marland Kitchen lisinopril (PRINIVIL,ZESTRIL) 20 MG tablet Take 1 tablet (20 mg total) by mouth daily. 90 tablet 3  . tamsulosin (FLOMAX) 0.4 MG CAPS capsule Take 1 capsule (0.4 mg total) by mouth daily. 90 capsule 3  . buPROPion [facility]  SR) 150 MG 12 hr tablet Take 1 tablet (150 mg total) by mouth 2 (two) times daily. (Patient taking differently: Take 150 mg by mouth daily. ) 180 tablet 1   Current Facility-Administered Medications on File Prior to Visit  Medication Dose Route Frequency Provider Last Rate Last Dose  . 0.9 %  sodium chloride infusion  500 mL Intravenous Continuous Irene Shipper, MD        BP 130/60 (BP Location: Right Arm)   Temp (!) 97.5 F (36.4 C) (Oral)   Wt 141 lb (64 kg)   BMI 24.20 kg/m       Objective:   Physical Exam  Constitutional: He is oriented to person, place, and time. He appears well-developed and well-nourished. No distress.  HENT:  No thinning hair noted, no bald spots   Cardiovascular: Normal rate, regular rhythm, normal heart sounds and intact distal pulses. Exam reveals no gallop.  No murmur heard. Pulmonary/Chest: Effort  normal and breath sounds normal. No respiratory distress. He has no wheezes. He has no rales. He exhibits no tenderness.  Neurological: He is alert and oriented to person, place, and time.  Skin: Skin is warm and dry. No rash noted. He is not diaphoretic. No erythema. No pallor.  Psychiatric: He has a normal mood and affect. His behavior is normal. Judgment and thought content normal.  Vitals reviewed.     Assessment & Plan:  1. Thinning hair - Thyroid and CBC were checked in August and were within normal limits  - Vitamin B12 - Vitamin D, 25-hydroxy  2. Benign prostatic hyperplasia with incomplete bladder emptying - Will add Avodart to Flomax - dutasteride (AVODART) 0.5 MG capsule; Take 1 capsule (0.5 mg total) by mouth daily.  Dispense: 30 capsule; Refill: 3 - Follow up if no improvement in a month   Dorothyann Peng, NP

## 2017-06-06 ENCOUNTER — Other Ambulatory Visit: Payer: Self-pay | Admitting: Adult Health

## 2017-06-06 DIAGNOSIS — R3914 Feeling of incomplete bladder emptying: Principal | ICD-10-CM

## 2017-06-06 DIAGNOSIS — N401 Enlarged prostate with lower urinary tract symptoms: Secondary | ICD-10-CM

## 2017-06-10 NOTE — Telephone Encounter (Signed)
Denied.  Filled on 05/07/17 for 4 months.  Message sent to the pharmacy to check file.

## 2017-06-13 ENCOUNTER — Other Ambulatory Visit: Payer: Self-pay | Admitting: Adult Health

## 2017-06-13 DIAGNOSIS — N401 Enlarged prostate with lower urinary tract symptoms: Secondary | ICD-10-CM

## 2017-06-13 DIAGNOSIS — R3914 Feeling of incomplete bladder emptying: Principal | ICD-10-CM

## 2017-06-16 NOTE — Telephone Encounter (Signed)
FILLED ON 05/07/17 FOR 4 MONTHS.  REQUEST IS TOO EARLY.  DENIED.  MESSAGE SENT TO THE PHARMACY.

## 2017-06-17 ENCOUNTER — Other Ambulatory Visit: Payer: Self-pay | Admitting: Adult Health

## 2017-06-17 ENCOUNTER — Encounter: Payer: Self-pay | Admitting: Adult Health

## 2017-06-17 DIAGNOSIS — N401 Enlarged prostate with lower urinary tract symptoms: Secondary | ICD-10-CM

## 2017-06-17 DIAGNOSIS — R3914 Feeling of incomplete bladder emptying: Principal | ICD-10-CM

## 2017-06-17 NOTE — Telephone Encounter (Signed)
Spoke to the pharmacy and advised that refills were sent in on 05/07/17.  Somehow, refills were deleted.  Pharmacy has put them back in their system.  No further action required.

## 2017-09-12 ENCOUNTER — Other Ambulatory Visit: Payer: Self-pay | Admitting: Adult Health

## 2017-09-12 DIAGNOSIS — N401 Enlarged prostate with lower urinary tract symptoms: Secondary | ICD-10-CM

## 2017-09-12 DIAGNOSIS — R3914 Feeling of incomplete bladder emptying: Principal | ICD-10-CM

## 2017-09-12 NOTE — Telephone Encounter (Signed)
Sent to the pharmacy by e-scribe for 90 days.  Due in Aug for annual.

## 2017-11-15 ENCOUNTER — Other Ambulatory Visit: Payer: Self-pay | Admitting: Adult Health

## 2017-11-15 DIAGNOSIS — R3914 Feeling of incomplete bladder emptying: Secondary | ICD-10-CM

## 2017-11-15 DIAGNOSIS — I10 Essential (primary) hypertension: Secondary | ICD-10-CM

## 2017-11-15 DIAGNOSIS — N401 Enlarged prostate with lower urinary tract symptoms: Secondary | ICD-10-CM

## 2017-11-15 DIAGNOSIS — F17210 Nicotine dependence, cigarettes, uncomplicated: Secondary | ICD-10-CM

## 2017-11-15 DIAGNOSIS — N400 Enlarged prostate without lower urinary tract symptoms: Secondary | ICD-10-CM

## 2017-11-18 NOTE — Telephone Encounter (Signed)
Sent to the pharmacy by e-scribe.  Pt now scheduled.

## 2017-11-18 NOTE — Telephone Encounter (Signed)
Scheduled patient for  12/19/2017 Status: Sch  Time: 7:30 AM    With [name].

## 2017-11-18 NOTE — Telephone Encounter (Signed)
Left a message for a return call.  Pt needs cpx on or after 12/18/17

## 2017-12-19 ENCOUNTER — Other Ambulatory Visit: Payer: Self-pay | Admitting: Adult Health

## 2017-12-19 ENCOUNTER — Ambulatory Visit (INDEPENDENT_AMBULATORY_CARE_PROVIDER_SITE_OTHER): Payer: Medicare Other | Admitting: Adult Health

## 2017-12-19 ENCOUNTER — Encounter: Payer: Self-pay | Admitting: Adult Health

## 2017-12-19 VITALS — BP 140/80 | Temp 97.7°F | Ht 64.0 in | Wt 147.0 lb

## 2017-12-19 DIAGNOSIS — F329 Major depressive disorder, single episode, unspecified: Secondary | ICD-10-CM | POA: Diagnosis not present

## 2017-12-19 DIAGNOSIS — Z0000 Encounter for general adult medical examination without abnormal findings: Secondary | ICD-10-CM | POA: Diagnosis not present

## 2017-12-19 DIAGNOSIS — E785 Hyperlipidemia, unspecified: Secondary | ICD-10-CM | POA: Insufficient documentation

## 2017-12-19 DIAGNOSIS — I10 Essential (primary) hypertension: Secondary | ICD-10-CM | POA: Diagnosis not present

## 2017-12-19 DIAGNOSIS — N400 Enlarged prostate without lower urinary tract symptoms: Secondary | ICD-10-CM

## 2017-12-19 DIAGNOSIS — E782 Mixed hyperlipidemia: Secondary | ICD-10-CM | POA: Diagnosis not present

## 2017-12-19 DIAGNOSIS — F32A Depression, unspecified: Secondary | ICD-10-CM

## 2017-12-19 DIAGNOSIS — Z23 Encounter for immunization: Secondary | ICD-10-CM

## 2017-12-19 DIAGNOSIS — Z720 Tobacco use: Secondary | ICD-10-CM

## 2017-12-19 LAB — LIPID PANEL
Cholesterol: 182 mg/dL (ref 0–200)
HDL: 53.5 mg/dL (ref >39.00)
LDL CALC: 111 mg/dL — AB (ref 0–99)
NONHDL: 128.66
Total CHOL/HDL Ratio: 3
Triglycerides: 90 mg/dL (ref 0.0–149.0)
VLDL: 18 mg/dL (ref 0.0–40.0)

## 2017-12-19 LAB — CBC WITH DIFFERENTIAL/PLATELET
BASOS ABS: 0.1 10*3/uL (ref 0.0–0.1)
Basophils Relative: 1 % (ref 0.0–3.0)
Eosinophils Absolute: 0.2 10*3/uL (ref 0.0–0.7)
Eosinophils Relative: 3 % (ref 0.0–5.0)
HCT: 45.2 % (ref 39.0–52.0)
HEMOGLOBIN: 15.3 g/dL (ref 13.0–17.0)
LYMPHS ABS: 1.8 10*3/uL (ref 0.7–4.0)
Lymphocytes Relative: 25.5 % (ref 12.0–46.0)
MCHC: 33.9 g/dL (ref 30.0–36.0)
MCV: 87.5 fl (ref 78.0–100.0)
MONO ABS: 0.6 10*3/uL (ref 0.1–1.0)
MONOS PCT: 9.2 % (ref 3.0–12.0)
NEUTROS PCT: 61.3 % (ref 43.0–77.0)
Neutro Abs: 4.2 10*3/uL (ref 1.4–7.7)
Platelets: 375 10*3/uL (ref 150.0–400.0)
RBC: 5.16 Mil/uL (ref 4.22–5.81)
RDW: 14.8 % (ref 11.5–15.5)
WBC: 6.9 10*3/uL (ref 4.0–10.5)

## 2017-12-19 LAB — BASIC METABOLIC PANEL
BUN: 23 mg/dL (ref 6–23)
CALCIUM: 10 mg/dL (ref 8.4–10.5)
CO2: 30 mEq/L (ref 19–32)
Chloride: 102 mEq/L (ref 96–112)
Creatinine, Ser: 1.13 mg/dL (ref 0.40–1.50)
GFR: 68.6 mL/min (ref >60.00)
GLUCOSE: 113 mg/dL — AB (ref 70–99)
POTASSIUM: 5.2 meq/L — AB (ref 3.5–5.1)
SODIUM: 139 meq/L (ref 135–145)

## 2017-12-19 LAB — HEPATIC FUNCTION PANEL
ALK PHOS: 119 U/L — AB (ref 39–117)
ALT: 30 U/L (ref 0–53)
AST: 20 U/L (ref 0–37)
Albumin: 4.2 g/dL (ref 3.5–5.2)
BILIRUBIN TOTAL: 0.5 mg/dL (ref 0.2–1.2)
Bilirubin, Direct: 0.1 mg/dL (ref 0.0–0.3)
Total Protein: 7 g/dL (ref 6.0–8.3)

## 2017-12-19 LAB — HEMOGLOBIN A1C: Hgb A1c MFr Bld: 6.2 % (ref 4.6–6.5)

## 2017-12-19 LAB — TSH: TSH: 2.69 u[IU]/mL (ref 0.35–4.50)

## 2017-12-19 LAB — PSA: PSA: 0.92 ng/mL (ref 0.10–4.00)

## 2017-12-19 MED ORDER — BUPROPION HCL ER (SR) 150 MG PO TB12: 150.0000 mg | ORAL_TABLET | Freq: Every day | ORAL | 1 refills | Status: DC

## 2017-12-19 MED ORDER — LISINOPRIL 20 MG PO TABS: ORAL_TABLET | ORAL | 3 refills | Status: DC

## 2017-12-19 MED ORDER — TAMSULOSIN HCL 0.4 MG PO CAPS: 0.8000 mg | ORAL_CAPSULE | Freq: Every day | ORAL | 3 refills | Status: AC

## 2017-12-19 NOTE — Progress Notes (Signed)
Subjective:    Patient ID: Samuel [name], male    DOB: Nov 21, 1950, 67 y.o.   MRN: [id number]  HPI  Patient presents for yearly preventative medicine examination. He is a pleasant 67 year old male who  has a past medical history of Anxiety, BPH with obstruction/lower urinary tract symptoms, Depression, Essential hypertension, Hyperlipidemia, and Inflammatory polyps of colon ([place]).  Hypertension - Takes lisinopril 20 mg daily.  He denies any headaches, blurred vision, lightheadedness or syncopal episodes and he does not monitor his blood pressure at home on a regular basis. He did not take his BP medication this morning  BP Readings from Last 3 Encounters:  12/19/17 140/80  05/07/17 130/60  02/04/17 137/86   Hyperlipidemia -currently takes Lipitor 20 mg daily Lab Results  Component Value Date   CHOL 239 (H) 12/17/2016   HDL 47.00 12/17/2016   LDLCALC 175 (H) 12/17/2016   TRIG 82.0 12/17/2016   CHOLHDL 5 12/17/2016   BPH -currently on dual therapy with Flomax and Avodart. He does not feel as though Avodart is working for him. He continues to have nocturia, getting up every 2 hours to urinate. Continues to complain of urgency as well. Feels as though stream is fine.    Depression -controlled on Wellbutrin 150 mg daily  COPD -feels controlled on Brio Ellipta.  All immunizations and health maintenance protocols were reviewed with the patient and needed orders were placed.  He is due for tetanus booster and Pneumovax 23  Appropriate screening laboratory values were ordered for the patient including screening of hyperlipidemia, renal function and hepatic function. If indicated by BPH, a PSA was ordered.  Medication reconciliation,  past medical history, social history, problem list and allergies were reviewed in detail with the patient  Goals were established with regard to weight loss, exercise, and  diet in compliance with medications Wt Readings from Last 3 Encounters:   12/19/17 147 lb (66.7 kg)  05/07/17 141 lb (64 kg)  02/04/17 130 lb (59 kg)   End of life planning was discussed.  He is up-to-date on his routine colonoscopy.  Does not participate in routine dental or vision screens  Review of Systems  Constitutional: Negative.   HENT: Negative.   Eyes: Negative.   Respiratory: Negative.   Cardiovascular: Negative.   Gastrointestinal: Negative.   Endocrine: Negative.   Genitourinary: Positive for frequency and urgency. Negative for dysuria and hematuria.  Musculoskeletal: Negative.   Skin: Negative.   Allergic/Immunologic: Negative.   Neurological: Negative.   Hematological: Negative.   Psychiatric/Behavioral: Negative.   All other systems reviewed and are negative.  Past Medical History:  Diagnosis Date  . Anxiety   . BPH with obstruction/lower urinary tract symptoms   . Depression   . Essential hypertension   . Hyperlipidemia   . Inflammatory polyps of colon [facility])     Social History   Socioeconomic History  . Marital status: Married    Spouse name: Not on file  . Number of children: Not on file  . Years of education: Not on file  . Highest education level: Not on file  Occupational History  . Not on file  Social Needs  . Financial resource strain: Not on file  . Food insecurity:    Worry: Not on file    Inability: Not on file  . Transportation needs:    Medical: Not on file    Non-medical: Not on file  Tobacco Use  . Smoking status: Former Smoker  Last attempt to quit: 01/16/2017    Years since quitting: 0.9  . Smokeless tobacco: Never Used  . Tobacco comment: Trying to quit.   Substance and Sexual Activity  . Alcohol use: No  . Drug use: No  . Sexual activity: Not on file  Lifestyle  . Physical activity:    Days per week: Not on file    Minutes per session: Not on file  . Stress: Not on file  Relationships  . Social connections:    Talks on phone: Not on file    Gets together: Not on file    Attends  religious service: Not on file    Active member of club or organization: Not on file    Attends meetings of clubs or organizations: Not on file    Relationship status: Not on file  . Intimate partner violence:    Fear of current or ex partner: Not on file    Emotionally abused: Not on file    Physically abused: Not on file    Forced sexual activity: Not on file  Other Topics Concern  . Not on file  Social History Narrative   Retired    Married       He watches television    History reviewed. No pertinent surgical history.  Family History  Problem Relation Age of Onset  . Hypertension Mother   . Stroke Mother   . Heart disease Father   . Breast cancer Sister   . Prostate cancer Brother   . Hypertension Sister   . Colon cancer Neg Hx   . Esophageal cancer Neg Hx   . Pancreatic cancer Neg Hx   . Rectal cancer Neg Hx   . Stomach cancer Neg Hx     Allergies  Allergen Reactions  . Penicillins     Current Outpatient Medications on File Prior to Visit  Medication Sig Dispense Refill  . atorvastatin (LIPITOR) 20 MG tablet Take 1 tablet (20 mg total) by mouth daily. 90 tablet 3  . BREO ELLIPTA 100-25 MCG/INH AEPB INL 1 PUFF ITL D  11  . BREO ELLIPTA 100-25 MCG/INH AEPB INHALE 1 PUFF INTO THE LUNGS DAILY 60 each 0  . dutasteride (AVODART) 0.5 MG capsule TAKE ONE CAPSULE BY MOUTH EVERY DAY 90 capsule 0  . ibuprofen (ADVIL,MOTRIN) 100 MG/5ML suspension Take 200 mg by mouth every 4 (four) hours as needed.    Marland Kitchen lisinopril (PRINIVIL,ZESTRIL) 20 MG tablet TAKE 1 TABLET(20 MG) BY MOUTH DAILY 90 tablet 0  . tamsulosin (FLOMAX) 0.4 MG CAPS capsule TAKE 1 CAPSULE(0.4 MG) BY MOUTH DAILY 90 capsule 0  . buPROPion (WELLBUTRIN SR) 150 MG 12 hr tablet Take 1 tablet (150 mg total) by mouth 2 (two) times daily. (Patient taking differently: Take 150 mg by mouth daily. ) 180 tablet 1   No current facility-administered medications on file prior to visit.     BP 140/80   Temp 97.7 F (36.5 C)  (Oral)   Ht 5\' 4"  (1.626 m)   Wt 147 lb (66.7 kg)   BMI 25.23 kg/m       Objective:   Physical Exam  Constitutional: He is oriented to person, place, and time. He appears well-developed and well-nourished. No distress.  HENT:  Head: Normocephalic and atraumatic.  Right Ear: External ear normal.  Left Ear: External ear normal.  Nose: Nose normal.  Mouth/Throat: Oropharynx is clear and moist. Abnormal dentition. No oropharyngeal exudate.  Eyes: Pupils are equal, round, and reactive  to light. Conjunctivae and EOM are normal. Right eye exhibits no discharge. Left eye exhibits no discharge. No scleral icterus.  Neck: Normal range of motion. Neck supple. No JVD present. No tracheal deviation present. No thyromegaly present.  Cardiovascular: Normal rate, regular rhythm, normal heart sounds and intact distal pulses. Exam reveals no gallop and no friction rub.  No murmur heard. Pulmonary/Chest: Effort normal and breath sounds normal. No stridor. No respiratory distress. He has no wheezes. He has no rales. He exhibits deformity (congenital ). He exhibits no tenderness.  Abdominal: Soft. Bowel sounds are normal. He exhibits no distension and no mass. There is no tenderness. There is no rebound and no guarding. No hernia.  Genitourinary:  Genitourinary Comments: Will do PSA  Musculoskeletal: Normal range of motion. He exhibits no edema, tenderness or deformity.  Genital deformity of left hand  Lymphadenopathy:    He has no cervical adenopathy.  Neurological: He is alert and oriented to person, place, and time. He displays normal reflexes. No cranial nerve deficit or sensory deficit. He exhibits normal muscle tone. Coordination normal.  Skin: Skin is warm and dry. No rash noted. He is not diaphoretic. No erythema. No pallor.  Psychiatric: He has a normal mood and affect. His behavior is normal. Judgment and thought content normal.  Nursing note and vitals reviewed.     Assessment & Plan:  1.  Routine general medical examination at a health care facility - Follow up in one year or sooner if needed - Encouraged heart healthy diet and frequent exercise  - Basic metabolic panel - CBC with Differential/Platelet - Hepatic function panel - Lipid panel - TSH - Hemoglobin A1c  2. Depression, unspecified depression type - Well controlled on Wellbutrin  - buPROPion (WELLBUTRIN SR) 150 MG 12 hr tablet; Take 1 tablet (150 mg total) by mouth daily.  Dispense: 90 tablet; Refill: 1  3. BPH without urinary obstruction - Will increase Flomax to 0.8 mg daily. Advised follow up if no improvement in the next month - PSA - tamsulosin (FLOMAX) 0.4 MG CAPS capsule; Take 2 capsules (0.8 mg total) by mouth daily after breakfast.  Dispense: 180 capsule; Refill: 3  4. Essential hypertension - Has been controlled with lisinopril. BP up today bu he did not take his medication - Will continue to monitor  - Basic metabolic panel - CBC with Differential/Platelet - Hepatic function panel - Lipid panel - TSH - Hemoglobin A1c - lisinopril (PRINIVIL,ZESTRIL) 20 MG tablet; TAKE 1 TABLET(20 MG) BY MOUTH DAILY  Dispense: 90 tablet; Refill: 3  5. Mixed hyperlipidemia - Consider increase in statin  - Basic metabolic panel - CBC with Differential/Platelet - Hepatic function panel - Lipid panel - TSH - Hemoglobin A1c  6. Need for vaccination against Streptococcus pneumoniae  - Pneumococcal polysaccharide vaccine 23-valent greater than or equal to 2yo subcutaneous/IM  7. Tobacco use - Continues to be smoke free - buPROPion (WELLBUTRIN SR) 150 MG 12 hr tablet; Take 1 tablet (150 mg total) by mouth daily.  Dispense: 90 tablet; Refill: 1  Dorothyann Peng, NP

## 2017-12-25 ENCOUNTER — Encounter: Payer: Self-pay | Admitting: Adult Health

## 2017-12-25 MED ORDER — ATORVASTATIN CALCIUM 20 MG PO TABS: 20.0000 mg | ORAL_TABLET | Freq: Every day | ORAL | 3 refills | Status: DC

## 2018-02-17 ENCOUNTER — Encounter: Payer: Self-pay | Admitting: Adult Health

## 2018-02-18 ENCOUNTER — Encounter: Payer: Self-pay | Admitting: Adult Health

## 2018-02-18 NOTE — Telephone Encounter (Signed)
Please advice [name], thanks.

## 2018-03-03 ENCOUNTER — Encounter: Payer: Self-pay | Admitting: Adult Health

## 2018-03-03 DIAGNOSIS — N401 Enlarged prostate with lower urinary tract symptoms: Secondary | ICD-10-CM

## 2018-03-03 DIAGNOSIS — R3914 Feeling of incomplete bladder emptying: Principal | ICD-10-CM

## 2018-04-03 ENCOUNTER — Other Ambulatory Visit: Payer: Self-pay | Admitting: Adult Health

## 2018-04-03 DIAGNOSIS — F17210 Nicotine dependence, cigarettes, uncomplicated: Secondary | ICD-10-CM

## 2018-04-06 NOTE — Telephone Encounter (Signed)
Sent to the pharmacy by e-scribe. 

## 2018-06-24 ENCOUNTER — Other Ambulatory Visit: Payer: Self-pay | Admitting: Adult Health

## 2018-06-24 DIAGNOSIS — Z720 Tobacco use: Secondary | ICD-10-CM

## 2018-06-24 DIAGNOSIS — F329 Major depressive disorder, single episode, unspecified: Secondary | ICD-10-CM

## 2018-06-24 DIAGNOSIS — F32A Depression, unspecified: Secondary | ICD-10-CM

## 2018-06-25 NOTE — Telephone Encounter (Signed)
Sent to the pharmacy by e-scribe. 

## 2018-08-06 ENCOUNTER — Other Ambulatory Visit: Payer: Self-pay | Admitting: Adult Health

## 2018-08-06 DIAGNOSIS — F17210 Nicotine dependence, cigarettes, uncomplicated: Secondary | ICD-10-CM

## 2018-08-06 NOTE — Telephone Encounter (Signed)
Sent to the pharmacy by e-scribe. 

## 2018-12-28 ENCOUNTER — Other Ambulatory Visit: Payer: Self-pay | Admitting: Adult Health

## 2018-12-28 DIAGNOSIS — F32A Depression, unspecified: Secondary | ICD-10-CM

## 2018-12-28 DIAGNOSIS — F329 Major depressive disorder, single episode, unspecified: Secondary | ICD-10-CM

## 2018-12-28 DIAGNOSIS — Z720 Tobacco use: Secondary | ICD-10-CM

## 2018-12-28 DIAGNOSIS — I10 Essential (primary) hypertension: Secondary | ICD-10-CM

## 2018-12-30 ENCOUNTER — Encounter: Payer: Self-pay | Admitting: Family Medicine

## 2018-12-30 NOTE — Telephone Encounter (Signed)
Sent to the pharmacy by e-scribe for 90 days.  Letter released to [place] notifying him that he is due for cpx and fasting lab work.

## 2019-01-03 ENCOUNTER — Encounter: Payer: Self-pay | Admitting: Adult Health

## 2019-01-05 ENCOUNTER — Other Ambulatory Visit: Payer: Self-pay | Admitting: Adult Health

## 2019-01-05 DIAGNOSIS — I10 Essential (primary) hypertension: Secondary | ICD-10-CM

## 2019-01-05 MED ORDER — LISINOPRIL 10 MG PO TABS: 20.0000 mg | ORAL_TABLET | Freq: Every day | ORAL | 0 refills | Status: DC

## 2019-02-19 ENCOUNTER — Encounter: Payer: Self-pay | Admitting: Adult Health

## 2019-02-19 ENCOUNTER — Other Ambulatory Visit: Payer: Self-pay | Admitting: Adult Health

## 2019-02-19 ENCOUNTER — Other Ambulatory Visit: Payer: Self-pay

## 2019-02-19 ENCOUNTER — Ambulatory Visit (INDEPENDENT_AMBULATORY_CARE_PROVIDER_SITE_OTHER): Payer: Medicare Other | Admitting: Adult Health

## 2019-02-19 VITALS — BP 152/84 | Temp 98.0°F | Ht 64.0 in | Wt 153.0 lb

## 2019-02-19 DIAGNOSIS — I10 Essential (primary) hypertension: Secondary | ICD-10-CM

## 2019-02-19 DIAGNOSIS — R3914 Feeling of incomplete bladder emptying: Secondary | ICD-10-CM

## 2019-02-19 DIAGNOSIS — F32A Depression, unspecified: Secondary | ICD-10-CM

## 2019-02-19 DIAGNOSIS — F329 Major depressive disorder, single episode, unspecified: Secondary | ICD-10-CM

## 2019-02-19 DIAGNOSIS — N401 Enlarged prostate with lower urinary tract symptoms: Secondary | ICD-10-CM | POA: Diagnosis not present

## 2019-02-19 DIAGNOSIS — N289 Disorder of kidney and ureter, unspecified: Secondary | ICD-10-CM

## 2019-02-19 DIAGNOSIS — Z0000 Encounter for general adult medical examination without abnormal findings: Secondary | ICD-10-CM | POA: Diagnosis not present

## 2019-02-19 DIAGNOSIS — R7309 Other abnormal glucose: Secondary | ICD-10-CM

## 2019-02-19 DIAGNOSIS — E782 Mixed hyperlipidemia: Secondary | ICD-10-CM | POA: Diagnosis not present

## 2019-02-19 DIAGNOSIS — J449 Chronic obstructive pulmonary disease, unspecified: Secondary | ICD-10-CM

## 2019-02-19 LAB — CBC WITH DIFFERENTIAL/PLATELET
Basophils Absolute: 0.1 10*3/uL (ref 0.0–0.1)
Basophils Relative: 1.3 % (ref 0.0–3.0)
Eosinophils Absolute: 0.3 10*3/uL (ref 0.0–0.7)
Eosinophils Relative: 2.6 % (ref 0.0–5.0)
HCT: 40 % (ref 39.0–52.0)
Hemoglobin: 13.3 g/dL (ref 13.0–17.0)
Lymphocytes Relative: 13.6 % (ref 12.0–46.0)
Lymphs Abs: 1.4 10*3/uL (ref 0.7–4.0)
MCHC: 33.2 g/dL (ref 30.0–36.0)
MCV: 88.8 fl (ref 78.0–100.0)
Monocytes Absolute: 0.9 10*3/uL (ref 0.1–1.0)
Monocytes Relative: 8.8 % (ref 3.0–12.0)
Neutro Abs: 7.5 10*3/uL (ref 1.4–7.7)
Neutrophils Relative %: 73.7 % (ref 43.0–77.0)
Platelets: 442 10*3/uL — ABNORMAL HIGH (ref 150.0–400.0)
RBC: 4.51 Mil/uL (ref 4.22–5.81)
RDW: 15.7 % — ABNORMAL HIGH (ref 11.5–15.5)
WBC: 10.1 10*3/uL (ref 4.0–10.5)

## 2019-02-19 LAB — COMPREHENSIVE METABOLIC PANEL
ALT: 20 U/L (ref 0–53)
AST: 14 U/L (ref 0–37)
Albumin: 4.5 g/dL (ref 3.5–5.2)
Alkaline Phosphatase: 138 U/L — ABNORMAL HIGH (ref 39–117)
BUN: 34 mg/dL — ABNORMAL HIGH (ref 6–23)
CO2: 26 mEq/L (ref 19–32)
Calcium: 10.4 mg/dL (ref 8.4–10.5)
Chloride: 104 mEq/L (ref 96–112)
Creatinine, Ser: 1.75 mg/dL — ABNORMAL HIGH (ref 0.40–1.50)
GFR: 38.83 mL/min — ABNORMAL LOW (ref >60.00)
Glucose, Bld: 115 mg/dL — ABNORMAL HIGH (ref 70–99)
Potassium: 5.3 mEq/L — ABNORMAL HIGH (ref 3.5–5.1)
Sodium: 140 mEq/L (ref 135–145)
Total Bilirubin: 0.6 mg/dL (ref 0.2–1.2)
Total Protein: 7.4 g/dL (ref 6.0–8.3)

## 2019-02-19 LAB — LIPID PANEL
Cholesterol: 193 mg/dL (ref 0–200)
HDL: 54 mg/dL (ref >39.00)
LDL Cholesterol: 120 mg/dL — ABNORMAL HIGH (ref 0–99)
NonHDL: 139.26
Total CHOL/HDL Ratio: 4
Triglycerides: 97 mg/dL (ref 0.0–149.0)
VLDL: 19.4 mg/dL (ref 0.0–40.0)

## 2019-02-19 LAB — TSH: TSH: 1.93 u[IU]/mL (ref 0.35–4.50)

## 2019-02-19 LAB — PSA: PSA: 1.84 ng/mL (ref 0.10–4.00)

## 2019-02-19 MED ORDER — LISINOPRIL 20 MG PO TABS: 20.0000 mg | ORAL_TABLET | Freq: Every day | ORAL | 3 refills | Status: DC

## 2019-02-19 MED ORDER — BUPROPION HCL ER (SR) 150 MG PO TB12: 150.0000 mg | ORAL_TABLET | Freq: Every day | ORAL | 1 refills | Status: DC

## 2019-02-19 MED ORDER — ATORVASTATIN CALCIUM 20 MG PO TABS: 20.0000 mg | ORAL_TABLET | Freq: Every day | ORAL | 3 refills | Status: DC

## 2019-02-19 MED ORDER — TRELEGY ELLIPTA 100-62.5-25 MCG/INH IN AEPB: 1.0000 | INHALATION_SPRAY | Freq: Every day | RESPIRATORY_TRACT | 11 refills | Status: DC

## 2019-02-19 NOTE — Patient Instructions (Signed)
It was great seeing you today   I have prescribed a new inhaler called Trelegy - this will replace Breo Ellipta to see if we can get you better controlled. Please let me know if this is too expensive   We will follow up with you regarding your labs

## 2019-02-19 NOTE — Progress Notes (Signed)
Subjective:    Patient ID: Samuel [name], male    DOB: 1951/02/11, 68 y.o.   MRN: [id number]  HPI  Patient presents for yearly preventative medicine examination. He is a pleasant 68 year old male who  has a past medical history of Anxiety, BPH with obstruction/lower urinary tract symptoms, Depression, Essential hypertension, Hyperlipidemia, and Inflammatory polyps of colon ([place]).  Hypertension -currently prescribed lisinopril 10 mg daily.  He denies headaches, blurred vision, lightheadedness, chest pain, shortness of breath, or syncopal episodes.  He does not monitor his blood pressures at home on a regular basis. He did not take his medication prior to arrival.  BP Readings from Last 3 Encounters:  02/19/19 (!) 152/84  12/19/17 140/80  05/07/17 130/60   Hyperlipidemia - Takes lipitor 20 mg daily.  He denies myalgia or fatigue Lab Results  Component Value Date   CHOL 182 12/19/2017   HDL 53.50 12/19/2017   LDLCALC 111 (H) 12/19/2017   TRIG 90.0 12/19/2017   CHOLHDL 3 12/19/2017   Depression -controlled with Wellbutrin 150 mg daily  COPD -Currently prescribed Breo Ellipta. He has had some wheezing and shortness of breath, especially when the weather changes.   BPH - used to be on Proscar and Flomax. He did not respond to this. His urologist wanted to do surgery but he refused to have it done   All immunizations and health maintenance protocols were reviewed with the patient and needed orders were placed. He is due for seasonal flu vaccination- refused  Appropriate screening laboratory values were ordered for the patient including screening of hyperlipidemia, renal function and hepatic function. If indicated by BPH, a PSA was ordered.  Medication reconciliation,  past medical history, social history, problem list and allergies were reviewed in detail with the patient  Goals were established with regard to weight loss, exercise, and  diet in compliance with  medications  He is up-to-date on routine screening colonoscopy.  He does not participate in routine dental or vision screens  Review of Systems  Constitutional: Negative.   HENT: Negative.   Eyes: Negative.   Respiratory: Positive for shortness of breath and wheezing.   Cardiovascular: Negative.   Gastrointestinal: Negative.   Endocrine: Negative.   Genitourinary: Negative.   Musculoskeletal: Negative.   Skin: Negative.   Allergic/Immunologic: Negative.   Neurological: Negative.   Hematological: Negative.   Psychiatric/Behavioral: Negative.   All other systems reviewed and are negative.  Past Medical History:  Diagnosis Date  . Anxiety   . BPH with obstruction/lower urinary tract symptoms   . Depression   . Essential hypertension   . Hyperlipidemia   . Inflammatory polyps of colon [facility])     Social History   Socioeconomic History  . Marital status: Married    Spouse name: Not on file  . Number of children: Not on file  . Years of education: Not on file  . Highest education level: Not on file  Occupational History  . Not on file  Social Needs  . Financial resource strain: Not on file  . Food insecurity    Worry: Not on file    Inability: Not on file  . Transportation needs    Medical: Not on file    Non-medical: Not on file  Tobacco Use  . Smoking status: Former Smoker    Quit date: 01/16/2017    Years since quitting: 2.0  . Smokeless tobacco: Never Used  . Tobacco comment: Trying to quit.   Substance and Sexual  Activity  . Alcohol use: No  . Drug use: No  . Sexual activity: Not on file  Lifestyle  . Physical activity    Days per week: Not on file    Minutes per session: Not on file  . Stress: Not on file  Relationships  . Social Herbalist on phone: Not on file    Gets together: Not on file    Attends religious service: Not on file    Active member of club or organization: Not on file    Attends meetings of clubs or organizations: Not on  file    Relationship status: Not on file  . Intimate partner violence    Fear of current or ex partner: Not on file    Emotionally abused: Not on file    Physically abused: Not on file    Forced sexual activity: Not on file  Other Topics Concern  . Not on file  Social History Narrative   Retired    Married       He watches television    No past surgical history on file.  Family History  Problem Relation Age of Onset  . Hypertension Mother   . Stroke Mother   . Heart disease Father   . Breast cancer Sister   . Prostate cancer Brother   . Hypertension Sister   . Colon cancer Neg Hx   . Esophageal cancer Neg Hx   . Pancreatic cancer Neg Hx   . Rectal cancer Neg Hx   . Stomach cancer Neg Hx     Allergies  Allergen Reactions  . Penicillins     Current Outpatient Medications on File Prior to Visit  Medication Sig Dispense Refill  . atorvastatin (LIPITOR) 20 MG tablet TAKE 1 TABLET BY MOUTH DAILY 90 tablet 0  . BREO ELLIPTA 100-25 MCG/INH AEPB INL 1 PUFF ITL D  11  . BREO ELLIPTA 100-25 MCG/INH AEPB INHALE 1 PUFF BY MOUTH DAILY 60 each 5  . buPROPion (WELLBUTRIN SR) 150 MG 12 hr tablet TAKE 1 TABLET BY MOUTH EVERY DAY 90 tablet 0  . ibuprofen (ADVIL,MOTRIN) 100 MG/5ML suspension Take 200 mg by mouth every 4 (four) hours as needed.    Marland Kitchen lisinopril (ZESTRIL) 10 MG tablet Take 2 tablets (20 mg total) by mouth daily. TAKE 1 TABLET BY MOUTH DAILY 180 tablet 0   No current facility-administered medications on file prior to visit.     BP (!) 152/84   Temp 98 F (36.7 C) (Temporal)   Ht 5\' 4"  (1.626 m)   Wt 153 lb (69.4 kg)   BMI 26.26 kg/m       Objective:   Physical Exam Vitals signs and nursing note reviewed.  Constitutional:      General: He is not in acute distress.    Appearance: Normal appearance. He is not diaphoretic.  HENT:     Head: Normocephalic and atraumatic.     Right Ear: Tympanic membrane, ear canal and external ear normal. There is no impacted  cerumen.     Left Ear: Tympanic membrane, ear canal and external ear normal. There is no impacted cerumen.     Nose: Nose normal. No congestion or rhinorrhea.     Mouth/Throat:     Mouth: Mucous membranes are moist.     Dentition: Abnormal dentition.     Pharynx: Oropharynx is clear. No oropharyngeal exudate or posterior oropharyngeal erythema.  Eyes:     General: No scleral icterus.  Right eye: No discharge.        Left eye: No discharge.     Extraocular Movements: Extraocular movements intact.     Conjunctiva/sclera: Conjunctivae normal.     Pupils: Pupils are equal, round, and reactive to light.  Neck:     Musculoskeletal: Normal range of motion and neck supple.     Thyroid: No thyromegaly.     Vascular: No JVD.     Trachea: No tracheal deviation.  Cardiovascular:     Rate and Rhythm: Normal rate and regular rhythm.     Pulses: Normal pulses.     Heart sounds: Normal heart sounds. No murmur. No friction rub. No gallop.   Pulmonary:     Effort: Pulmonary effort is normal. No respiratory distress.     Breath sounds: No stridor. Wheezing (trace throughout ) present. No rhonchi or rales.  Chest:     Chest wall: No tenderness.  Abdominal:     General: Bowel sounds are normal. There is no distension.     Palpations: Abdomen is soft. There is no mass.     Tenderness: There is no abdominal tenderness. There is no right CVA tenderness, left CVA tenderness, guarding or rebound.     Hernia: No hernia is present.  Musculoskeletal: Normal range of motion.        General: Deformity (congenital deformity of left hand) present. No swelling, tenderness or signs of injury.     Right lower leg: No edema.     Left lower leg: No edema.  Lymphadenopathy:     Cervical: No cervical adenopathy.  Skin:    General: Skin is warm and dry.     Capillary Refill: Capillary refill takes less than 2 seconds.     Coloration: Skin is not jaundiced or pale.     Findings: No bruising, erythema, lesion  or rash.  Neurological:     General: No focal deficit present.     Mental Status: He is alert and oriented to person, place, and time. Mental status is at baseline.     Cranial Nerves: No cranial nerve deficit.     Sensory: No sensory deficit.     Motor: No weakness or abnormal muscle tone.     Coordination: Coordination normal.     Gait: Gait normal.     Deep Tendon Reflexes: Reflexes are normal and symmetric. Reflexes normal.  Psychiatric:        Mood and Affect: Mood normal.        Behavior: Behavior normal.        Thought Content: Thought content normal.        Judgment: Judgment normal.        Assessment & Plan:  1. Routine general medical examination at a health care facility  - CBC with Differential/Platelet - Comprehensive metabolic panel - Lipid panel - PSA - TSH  2. Benign prostatic hyperplasia with incomplete bladder emptying  - PSA  3. Essential hypertension - Will increase lisinopril to 20 mg for better BP control  - Encouraged to monitor BP at home and report readings in the next two weeks  - CBC with Differential/Platelet - Comprehensive metabolic panel - Lipid panel - PSA - TSH - lisinopril (ZESTRIL) 20 MG tablet; Take 1 tablet (20 mg total) by mouth daily. TAKE 1 TABLET BY MOUTH DAILY  Dispense: 90 tablet; Refill: 3  4. Depression, unspecified depression type - No change in medications  - CBC with Differential/Platelet - Comprehensive metabolic panel - Lipid  panel - PSA - TSH - buPROPion (WELLBUTRIN SR) 150 MG 12 hr tablet; Take 1 tablet (150 mg total) by mouth daily.  Dispense: 90 tablet; Refill: 1  5. Mixed hyperlipidemia - Continue with statin  - CBC with Differential/Platelet - Comprehensive metabolic panel - Lipid panel - PSA - TSH - atorvastatin (LIPITOR) 20 MG tablet; Take 1 tablet (20 mg total) by mouth daily.  Dispense: 90 tablet; Refill: 3  6. Chronic obstructive pulmonary disease, unspecified COPD type ([place]) - Will trial him on  Trelegy for better management  - Fluticasone-Umeclidin-Vilant (TRELEGY ELLIPTA) 100-62.5-25 MCG/INH AEPB; Inhale 1 puff into the lungs daily.  Dispense: 28 each; Refill: [facility]

## 2019-04-16 ENCOUNTER — Other Ambulatory Visit: Payer: Self-pay | Admitting: Adult Health

## 2019-04-16 ENCOUNTER — Encounter: Payer: Self-pay | Admitting: Adult Health

## 2019-04-16 MED ORDER — BREO ELLIPTA 100-25 MCG/INH IN AEPB: 1.0000 | INHALATION_SPRAY | Freq: Once | RESPIRATORY_TRACT | 6 refills | Status: AC

## 2019-09-26 ENCOUNTER — Other Ambulatory Visit: Payer: Self-pay | Admitting: Adult Health

## 2019-09-26 DIAGNOSIS — F32A Depression, unspecified: Secondary | ICD-10-CM

## 2019-09-29 NOTE — Telephone Encounter (Signed)
SENT TO THE PHARMACY BY E-SCRIBE. 

## 2020-03-20 ENCOUNTER — Encounter: Payer: Self-pay | Admitting: Adult Health

## 2020-03-23 ENCOUNTER — Ambulatory Visit (INDEPENDENT_AMBULATORY_CARE_PROVIDER_SITE_OTHER): Payer: Medicare Other | Admitting: Adult Health

## 2020-03-23 ENCOUNTER — Encounter: Payer: Self-pay | Admitting: Adult Health

## 2020-03-23 ENCOUNTER — Other Ambulatory Visit: Payer: Self-pay

## 2020-03-23 VITALS — BP 132/80 | HR 107 | Temp 97.8°F | Ht 64.0 in | Wt 153.0 lb

## 2020-03-23 DIAGNOSIS — N401 Enlarged prostate with lower urinary tract symptoms: Secondary | ICD-10-CM

## 2020-03-23 DIAGNOSIS — E7439 Other disorders of intestinal carbohydrate absorption: Secondary | ICD-10-CM

## 2020-03-23 DIAGNOSIS — F32A Depression, unspecified: Secondary | ICD-10-CM

## 2020-03-23 DIAGNOSIS — I10 Essential (primary) hypertension: Secondary | ICD-10-CM

## 2020-03-23 DIAGNOSIS — Z0000 Encounter for general adult medical examination without abnormal findings: Secondary | ICD-10-CM

## 2020-03-23 DIAGNOSIS — E782 Mixed hyperlipidemia: Secondary | ICD-10-CM

## 2020-03-23 DIAGNOSIS — J449 Chronic obstructive pulmonary disease, unspecified: Secondary | ICD-10-CM

## 2020-03-23 DIAGNOSIS — R3914 Feeling of incomplete bladder emptying: Secondary | ICD-10-CM

## 2020-03-23 MED ORDER — BUPROPION HCL ER (XL) 300 MG PO TB24: 300.0000 mg | ORAL_TABLET | Freq: Every day | ORAL | 1 refills | Status: DC

## 2020-03-23 MED ORDER — LISINOPRIL 20 MG PO TABS: 20.0000 mg | ORAL_TABLET | Freq: Every day | ORAL | 3 refills | Status: DC

## 2020-03-23 MED ORDER — ATORVASTATIN CALCIUM 20 MG PO TABS: 20.0000 mg | ORAL_TABLET | Freq: Every day | ORAL | 3 refills | Status: DC

## 2020-03-23 NOTE — Patient Instructions (Signed)
It was great seeing you today   I have increased your wellbutrin to 300 mg to see if he can get you feeling better  Please follow up for fasting lab work - you can call back to make a lab appointment   I will let you know what I find in regards to an inhaler your insurance company will cover

## 2020-03-23 NOTE — Progress Notes (Signed)
Subjective:    Patient ID: Samuel [name], male    DOB: 01-01-1951, 69 y.o.   MRN: [id number]  HPI  Patient presents for yearly preventative medicine examination. He is a pleasant 69 year old male who  has a past medical history of Anxiety, BPH with obstruction/lower urinary tract symptoms, Depression, Essential hypertension, Hyperlipidemia, and Inflammatory polyps of colon ([place]).  Essential hypertension-is prescribed lisinopril 20 mg daily.  He denies headaches, blurred vision, lightheadedness, chest pain, shortness of breath, or syncopal episodes.  He does not monitor his blood pressures at home on a regular basis  BP Readings from Last 3 Encounters:  03/23/20 132/80  02/19/19 (!) 152/84  12/19/17 140/80   Hyperlipidemia - takes Lipitor 20 mg daily. He denies myalgia or fatigue  Lab Results  Component Value Date   CHOL 193 02/19/2019   HDL 54.00 02/19/2019   LDLCALC 120 (H) 02/19/2019   TRIG 97.0 02/19/2019   CHOLHDL 4 02/19/2019    Depression -currently prescribed Wellbutrin 150 mg daily.  He reports that for the most part this works well for him but he still continues to have episodes of depression and anxiety.  He is wondering if he can try going up on the Wellbutrin to see if he can get better control  COPD - is currently prescribed Trelegy ellipta - this is pricey for him.  Has been on Brio Ellipta in the past and this did not work well.  He continues to have episodes of wheezing and shortness of breath  BPH - Has trouble with nocturia and decreased stream.  Has been prescribed Flomax and Proscar which did not help.  Urology wanted to do surgery and he refused.  He does not want to go on anything additional or have the surgery at this time  Glucose Intolerance - not on any medication currently.   All immunizations and health maintenance protocols were reviewed with the patient and needed orders were placed.  Appropriate screening laboratory values were ordered  for the patient including screening of hyperlipidemia, renal function and hepatic function. If indicated by BPH, a PSA was ordered.  Medication reconciliation,  past medical history, social history, problem list and allergies were reviewed in detail with the patient  Goals were established with regard to weight loss, exercise, and  diet in compliance with medications Wt Readings from Last 3 Encounters:  03/23/20 153 lb (69.4 kg)  02/19/19 153 lb (69.4 kg)  12/19/17 147 lb (66.7 kg)   Review of Systems  Constitutional: Negative.   HENT: Negative.   Eyes: Negative.   Respiratory: Positive for shortness of breath and wheezing.   Cardiovascular: Negative.   Gastrointestinal: Negative.   Endocrine: Negative.   Genitourinary: Negative.   Musculoskeletal: Negative.   Skin: Negative.   Allergic/Immunologic: Negative.   Neurological: Negative.   Hematological: Negative.   Psychiatric/Behavioral: Positive for dysphoric mood. Negative for suicidal ideas. The patient is nervous/anxious.   All other systems reviewed and are negative.  Past Medical History:  Diagnosis Date  . Anxiety   . BPH with obstruction/lower urinary tract symptoms   . Depression   . Essential hypertension   . Hyperlipidemia   . Inflammatory polyps of colon [facility])     Social History   Socioeconomic History  . Marital status: Married    Spouse name: Not on file  . Number of children: Not on file  . Years of education: Not on file  . Highest education level: Not on file  Occupational  History  . Not on file  Tobacco Use  . Smoking status: Former Smoker    Quit date: 01/16/2017    Years since quitting: 3.1  . Smokeless tobacco: Never Used  . Tobacco comment: Trying to quit.   Vaping Use  . Vaping Use: Never used  Substance and Sexual Activity  . Alcohol use: No  . Drug use: No  . Sexual activity: Not on file  Other Topics Concern  . Not on file  Social History Narrative   Retired    Married       He  watches television   Social Determinants of Health   Financial Resource Strain:   . Difficulty of Paying Living Expenses: Not on file  Food Insecurity:   . Worried About Charity fundraiser in the Last Year: Not on file  . Ran Out of Food in the Last Year: Not on file  Transportation Needs:   . Lack of Transportation (Medical): Not on file  . Lack of Transportation (Non-Medical): Not on file  Physical Activity:   . Days of Exercise per Week: Not on file  . Minutes of Exercise per Session: Not on file  Stress:   . Feeling of Stress : Not on file  Social Connections:   . Frequency of Communication with Friends and Family: Not on file  . Frequency of Social Gatherings with Friends and Family: Not on file  . Attends Religious Services: Not on file  . Active Member of Clubs or Organizations: Not on file  . Attends Archivist Meetings: Not on file  . Marital Status: Not on file  Intimate Partner Violence:   . Fear of Current or Ex-Partner: Not on file  . Emotionally Abused: Not on file  . Physically Abused: Not on file  . Sexually Abused: Not on file    History reviewed. No pertinent surgical history.  Family History  Problem Relation Age of Onset  . Hypertension Mother   . Stroke Mother   . Heart disease Father   . Breast cancer Sister   . Prostate cancer Brother   . Hypertension Sister   . Colon cancer Neg Hx   . Esophageal cancer Neg Hx   . Pancreatic cancer Neg Hx   . Rectal cancer Neg Hx   . Stomach cancer Neg Hx     Allergies  Allergen Reactions  . Penicillins     Current Outpatient Medications on File Prior to Visit  Medication Sig Dispense Refill  . ibuprofen (ADVIL,MOTRIN) 100 MG/5ML suspension Take 200 mg by mouth every 4 (four) hours as needed.    . vitamin B-12 (CYANOCOBALAMIN) 1000 MCG tablet Take 1,000 mcg by mouth daily.     No current facility-administered medications on file prior to visit.    BP 132/80 (Patient Position: Sitting, Cuff  Size: Normal)   Pulse (!) 107   Temp 97.8 F (36.6 C) (Oral)   Ht '5\' 4"'  (1.626 m)   Wt 153 lb (69.4 kg)   BMI 26.26 kg/m       Objective:   Physical Exam Vitals and nursing note reviewed.  Constitutional:      General: He is not in acute distress.    Appearance: Normal appearance. He is well-developed and normal weight.  HENT:     Head: Normocephalic and atraumatic.     Right Ear: Tympanic membrane, ear canal and external ear normal. There is no impacted cerumen.     Left Ear: Tympanic membrane, ear  canal and external ear normal. There is no impacted cerumen.     Nose: Nose normal. No congestion or rhinorrhea.     Mouth/Throat:     Mouth: Mucous membranes are moist.     Pharynx: Oropharynx is clear. No oropharyngeal exudate or posterior oropharyngeal erythema.  Eyes:     General:        Right eye: No discharge.        Left eye: No discharge.     Extraocular Movements: Extraocular movements intact.     Conjunctiva/sclera: Conjunctivae normal.     Pupils: Pupils are equal, round, and reactive to light.  Neck:     Vascular: No carotid bruit.     Trachea: No tracheal deviation.  Cardiovascular:     Rate and Rhythm: Normal rate and regular rhythm.     Pulses: Normal pulses.     Heart sounds: Normal heart sounds. No murmur heard.  No friction rub. No gallop.   Pulmonary:     Effort: Pulmonary effort is normal. No respiratory distress.     Breath sounds: No stridor. Wheezing (trace throughout ) present. No rhonchi or rales.  Chest:     Chest wall: No tenderness.  Abdominal:     General: Bowel sounds are normal. There is no distension.     Palpations: Abdomen is soft. There is no mass.     Tenderness: There is no abdominal tenderness. There is no right CVA tenderness, left CVA tenderness, guarding or rebound.     Hernia: No hernia is present.  Musculoskeletal:        General: Deformity (congential deformity of left hand ) present. No swelling, tenderness or signs of injury.  Normal range of motion.     Right lower leg: No edema.     Left lower leg: No edema.  Lymphadenopathy:     Cervical: No cervical adenopathy.  Skin:    General: Skin is warm and dry.     Capillary Refill: Capillary refill takes less than 2 seconds.     Coloration: Skin is not jaundiced or pale.     Findings: No bruising, erythema, lesion or rash.  Neurological:     General: No focal deficit present.     Mental Status: He is alert and oriented to person, place, and time.     Cranial Nerves: No cranial nerve deficit.     Sensory: No sensory deficit.     Motor: No weakness.     Coordination: Coordination normal.     Gait: Gait normal.     Deep Tendon Reflexes: Reflexes normal.  Psychiatric:        Mood and Affect: Mood normal.        Behavior: Behavior normal.        Thought Content: Thought content normal.        Judgment: Judgment normal.       Assessment & Plan:  1. Routine general medical examination at a health care facility - Encouraged heart healthy diet and exercise - Follow up in one year or sooner if needed - CBC with Differential/Platelet; Future - Hemoglobin A1c; Future - Lipid panel; Future - TSH; Future - CMP with eGFR([facility]); Future  2. Glucose intolerance - Consider metformin  - CBC with Differential/Platelet; Future - Hemoglobin A1c; Future - Lipid panel; Future - TSH; Future - CMP with eGFR([facility]); Future  3. Benign prostatic hyperplasia with incomplete bladder emptying  - PSA; Future  4. Essential hypertension - No change - lisinopril (ZESTRIL)  20 MG tablet; Take 1 tablet (20 mg total) by mouth daily. TAKE 1 TABLET BY MOUTH DAILY  Dispense: 90 tablet; Refill: 3 - CBC with Differential/Platelet; Future - Hemoglobin A1c; Future - Lipid panel; Future - TSH; Future - CMP with eGFR([facility]); Future  5. Depression, unspecified depression type - Will increase Wellbutrin to 300 mg XR to see if he has better symptom management. Advised follow up in one  month if no better - buPROPion (WELLBUTRIN XL) 300 MG 24 hr tablet; Take 1 tablet (300 mg total) by mouth daily.  Dispense: 90 tablet; Refill: 1  6. Mixed hyperlipidemia  - atorvastatin (LIPITOR) 20 MG tablet; Take 1 tablet (20 mg total) by mouth daily.  Dispense: 90 tablet; Refill: 3 - CBC with Differential/Platelet; Future - Hemoglobin A1c; Future - Lipid panel; Future - TSH; Future - CMP with eGFR([facility]); Future  7. Chronic obstructive pulmonary disease, unspecified COPD type ([place]) - Will look at his insurance plan and see if there is a cheaper inhaler    Dorothyann Peng, NP

## 2020-03-24 ENCOUNTER — Other Ambulatory Visit: Payer: Self-pay | Admitting: Adult Health

## 2020-03-24 DIAGNOSIS — E782 Mixed hyperlipidemia: Secondary | ICD-10-CM

## 2020-03-24 DIAGNOSIS — F32A Depression, unspecified: Secondary | ICD-10-CM

## 2020-03-27 ENCOUNTER — Other Ambulatory Visit: Payer: Self-pay | Admitting: Adult Health

## 2020-03-27 DIAGNOSIS — I10 Essential (primary) hypertension: Secondary | ICD-10-CM

## 2020-04-04 ENCOUNTER — Other Ambulatory Visit: Payer: Self-pay

## 2020-04-04 ENCOUNTER — Other Ambulatory Visit (INDEPENDENT_AMBULATORY_CARE_PROVIDER_SITE_OTHER): Payer: Medicare Other

## 2020-04-04 DIAGNOSIS — Z0000 Encounter for general adult medical examination without abnormal findings: Secondary | ICD-10-CM | POA: Diagnosis not present

## 2020-04-04 DIAGNOSIS — N401 Enlarged prostate with lower urinary tract symptoms: Secondary | ICD-10-CM

## 2020-04-04 DIAGNOSIS — E7439 Other disorders of intestinal carbohydrate absorption: Secondary | ICD-10-CM

## 2020-04-04 DIAGNOSIS — I10 Essential (primary) hypertension: Secondary | ICD-10-CM | POA: Diagnosis not present

## 2020-04-04 DIAGNOSIS — R3914 Feeling of incomplete bladder emptying: Secondary | ICD-10-CM | POA: Diagnosis not present

## 2020-04-04 DIAGNOSIS — E782 Mixed hyperlipidemia: Secondary | ICD-10-CM

## 2020-04-04 DIAGNOSIS — Z131 Encounter for screening for diabetes mellitus: Secondary | ICD-10-CM | POA: Diagnosis not present

## 2020-04-05 ENCOUNTER — Other Ambulatory Visit: Payer: Self-pay

## 2020-04-05 DIAGNOSIS — E782 Mixed hyperlipidemia: Secondary | ICD-10-CM

## 2020-04-05 DIAGNOSIS — N289 Disorder of kidney and ureter, unspecified: Secondary | ICD-10-CM

## 2020-04-05 LAB — COMPLETE METABOLIC PANEL WITH GFR
AG Ratio: 1.4 (calc) (ref 1.0–2.5)
ALT: 15 U/L (ref 9–46)
AST: 13 U/L (ref 10–35)
Albumin: 4.3 g/dL (ref 3.6–5.1)
Alkaline phosphatase (APISO): 141 U/L (ref 35–144)
BUN/Creatinine Ratio: 22 (calc) (ref 6–22)
BUN: 48 mg/dL — ABNORMAL HIGH (ref 7–25)
CO2: 27 mmol/L (ref 20–32)
Calcium: 10.3 mg/dL (ref 8.6–10.3)
Chloride: 105 mmol/L (ref 98–110)
Creat: 2.14 mg/dL — ABNORMAL HIGH (ref 0.70–1.18)
GFR, Est African American: 35 mL/min/{1.73_m2} — ABNORMAL LOW (ref >=60)
GFR, Est Non African American: 30 mL/min/{1.73_m2} — ABNORMAL LOW (ref >=60)
Globulin: 3.1 g/dL (calc) (ref 1.9–3.7)
Glucose, Bld: 113 mg/dL — ABNORMAL HIGH (ref 65–99)
Potassium: 5.4 mmol/L — ABNORMAL HIGH (ref 3.5–5.3)
Sodium: 140 mmol/L (ref 135–146)
Total Bilirubin: 0.5 mg/dL (ref 0.2–1.2)
Total Protein: 7.4 g/dL (ref 6.1–8.1)

## 2020-04-05 LAB — CBC WITH DIFFERENTIAL/PLATELET
Absolute Monocytes: 798 cells/uL (ref 200–950)
Basophils Absolute: 61 cells/uL (ref 0–200)
Basophils Relative: 0.6 %
Eosinophils Absolute: 263 cells/uL (ref 15–500)
Eosinophils Relative: 2.6 %
HCT: 43.6 % (ref 38.5–50.0)
Hemoglobin: 14.4 g/dL (ref 13.2–17.1)
Lymphs Abs: 1364 cells/uL (ref 850–3900)
MCH: 28.7 pg (ref 27.0–33.0)
MCHC: 33 g/dL (ref 32.0–36.0)
MCV: 87 fL (ref 80.0–100.0)
MPV: 10 fL (ref 7.5–12.5)
Monocytes Relative: 7.9 %
Neutro Abs: 7615 cells/uL (ref 1500–7800)
Neutrophils Relative %: 75.4 %
Platelets: 437 10*3/uL — ABNORMAL HIGH (ref 140–400)
RBC: 5.01 10*6/uL (ref 4.20–5.80)
RDW: 13.4 % (ref 11.0–15.0)
Total Lymphocyte: 13.5 %
WBC: 10.1 10*3/uL (ref 3.8–10.8)

## 2020-04-05 LAB — LIPID PANEL
Cholesterol: 208 mg/dL — ABNORMAL HIGH (ref <200)
HDL: 58 mg/dL (ref >=40)
LDL Cholesterol (Calc): 129 mg/dL (calc) — ABNORMAL HIGH
Non-HDL Cholesterol (Calc): 150 mg/dL (calc) — ABNORMAL HIGH (ref <130)
Total CHOL/HDL Ratio: 3.6 (calc) (ref <5.0)
Triglycerides: 99 mg/dL (ref <150)

## 2020-04-05 LAB — HEMOGLOBIN A1C
Hgb A1c MFr Bld: 5.9 % of total Hgb — ABNORMAL HIGH (ref <5.7)
Mean Plasma Glucose: 123 (calc)
eAG (mmol/L): 6.8 (calc)

## 2020-04-05 LAB — TSH: TSH: 1.89 mIU/L (ref 0.40–4.50)

## 2020-04-05 LAB — PSA: PSA: 3.13 ng/mL (ref <=4.0)

## 2020-04-05 MED ORDER — ATORVASTATIN CALCIUM 40 MG PO TABS: 20.0000 mg | ORAL_TABLET | Freq: Every day | ORAL | 3 refills | Status: DC

## 2020-04-21 DIAGNOSIS — E875 Hyperkalemia: Secondary | ICD-10-CM | POA: Diagnosis not present

## 2020-04-21 DIAGNOSIS — R319 Hematuria, unspecified: Secondary | ICD-10-CM | POA: Diagnosis not present

## 2020-04-21 DIAGNOSIS — N1832 Chronic kidney disease, stage 3b: Secondary | ICD-10-CM | POA: Diagnosis not present

## 2020-04-21 DIAGNOSIS — N179 Acute kidney failure, unspecified: Secondary | ICD-10-CM | POA: Diagnosis not present

## 2020-04-21 DIAGNOSIS — I129 Hypertensive chronic kidney disease with stage 1 through stage 4 chronic kidney disease, or unspecified chronic kidney disease: Secondary | ICD-10-CM | POA: Diagnosis not present

## 2020-04-25 ENCOUNTER — Other Ambulatory Visit: Payer: Self-pay | Admitting: Nephrology

## 2020-04-25 DIAGNOSIS — N179 Acute kidney failure, unspecified: Secondary | ICD-10-CM

## 2020-04-25 DIAGNOSIS — N1832 Chronic kidney disease, stage 3b: Secondary | ICD-10-CM

## 2020-05-15 DIAGNOSIS — R310 Gross hematuria: Secondary | ICD-10-CM | POA: Diagnosis not present

## 2020-05-15 DIAGNOSIS — R3915 Urgency of urination: Secondary | ICD-10-CM | POA: Diagnosis not present

## 2020-05-17 ENCOUNTER — Ambulatory Visit
Admission: RE | Admit: 2020-05-17 | Discharge: 2020-05-17 | Disposition: A | Discharge status: Home / Self Care | Payer: Medicare Other | Source: Ambulatory Visit | Attending: Nephrology | Admitting: Nephrology

## 2020-05-17 DIAGNOSIS — N261 Atrophy of kidney (terminal): Secondary | ICD-10-CM | POA: Diagnosis not present

## 2020-05-17 DIAGNOSIS — N189 Chronic kidney disease, unspecified: Secondary | ICD-10-CM | POA: Diagnosis not present

## 2020-05-17 DIAGNOSIS — N1832 Chronic kidney disease, stage 3b: Secondary | ICD-10-CM

## 2020-05-17 DIAGNOSIS — N179 Acute kidney failure, unspecified: Secondary | ICD-10-CM

## 2020-05-17 DIAGNOSIS — N281 Cyst of kidney, acquired: Secondary | ICD-10-CM | POA: Diagnosis not present

## 2020-05-30 ENCOUNTER — Ambulatory Visit: Payer: Medicare Other | Admitting: Adult Health

## 2020-05-31 DIAGNOSIS — N1832 Chronic kidney disease, stage 3b: Secondary | ICD-10-CM | POA: Diagnosis not present

## 2020-06-05 DIAGNOSIS — N179 Acute kidney failure, unspecified: Secondary | ICD-10-CM | POA: Diagnosis not present

## 2020-06-05 DIAGNOSIS — R319 Hematuria, unspecified: Secondary | ICD-10-CM | POA: Diagnosis not present

## 2020-06-05 DIAGNOSIS — E559 Vitamin D deficiency, unspecified: Secondary | ICD-10-CM | POA: Diagnosis not present

## 2020-06-05 DIAGNOSIS — N1832 Chronic kidney disease, stage 3b: Secondary | ICD-10-CM | POA: Diagnosis not present

## 2020-06-05 DIAGNOSIS — E875 Hyperkalemia: Secondary | ICD-10-CM | POA: Diagnosis not present

## 2020-06-05 DIAGNOSIS — I129 Hypertensive chronic kidney disease with stage 1 through stage 4 chronic kidney disease, or unspecified chronic kidney disease: Secondary | ICD-10-CM | POA: Diagnosis not present

## 2020-06-05 DIAGNOSIS — J449 Chronic obstructive pulmonary disease, unspecified: Secondary | ICD-10-CM | POA: Diagnosis not present

## 2020-06-06 ENCOUNTER — Encounter: Payer: Self-pay | Admitting: Adult Health

## 2020-06-07 ENCOUNTER — Other Ambulatory Visit: Payer: Self-pay

## 2020-06-08 ENCOUNTER — Encounter: Payer: Self-pay | Admitting: Adult Health

## 2020-06-08 ENCOUNTER — Ambulatory Visit (INDEPENDENT_AMBULATORY_CARE_PROVIDER_SITE_OTHER): Payer: Medicare Other | Admitting: Adult Health

## 2020-06-08 ENCOUNTER — Ambulatory Visit (INDEPENDENT_AMBULATORY_CARE_PROVIDER_SITE_OTHER): Payer: Medicare Other

## 2020-06-08 VITALS — BP 132/82 | Temp 97.6°F | Wt 149.0 lb

## 2020-06-08 DIAGNOSIS — J449 Chronic obstructive pulmonary disease, unspecified: Secondary | ICD-10-CM | POA: Diagnosis not present

## 2020-06-08 DIAGNOSIS — D1779 Benign lipomatous neoplasm of other sites: Secondary | ICD-10-CM

## 2020-06-08 DIAGNOSIS — R0602 Shortness of breath: Secondary | ICD-10-CM | POA: Diagnosis not present

## 2020-06-08 NOTE — Patient Instructions (Signed)
Try the new inhaler - two puff twice a day for 10 days and let me know how you are doing   I will get the xray today and let you know what that shows.

## 2020-06-08 NOTE — Progress Notes (Signed)
Subjective:    Patient ID: Samuel [name], male    DOB: 01/31/1951, 70 y.o.   MRN: [id number]  HPI  70 year old male who  has a past medical history of Anxiety, BPH with obstruction/lower urinary tract symptoms, Depression, Essential hypertension, Hyperlipidemia, and Inflammatory polyps of colon ([place]).   Known COPD due to smoking history.  Currently using Trelegy inhaler.  In the past he has been well controlled on this inhaler but over the last few months he has had more shortness of breath and wheezing during the day.  Shortness of breath and wheezing is pretty constant and is apparent with rest and exertion.  When he uses his Trelegy in the evening his symptoms improve.  Denies chest pain, lower extremity edema, or productive cough   Additionally, he has a mass on his right upper back that he would like to have evaluated   Review of Systems See HPI   Past Medical History:  Diagnosis Date  . Anxiety   . BPH with obstruction/lower urinary tract symptoms   . Depression   . Essential hypertension   . Hyperlipidemia   . Inflammatory polyps of colon [facility])     Social History   Socioeconomic History  . Marital status: Married    Spouse name: Not on file  . Number of children: Not on file  . Years of education: Not on file  . Highest education level: Not on file  Occupational History  . Not on file  Tobacco Use  . Smoking status: Former Smoker    Quit date: 01/16/2017    Years since quitting: 3.3  . Smokeless tobacco: Never Used  . Tobacco comment: Trying to quit.   Vaping Use  . Vaping Use: Never used  Substance and Sexual Activity  . Alcohol use: No  . Drug use: No  . Sexual activity: Not on file  Other Topics Concern  . Not on file  Social History Narrative   Retired    Married       He watches television   Social Determinants of Radio broadcast assistant Strain: Not on file  Food Insecurity: Not on file  Transportation Needs: Not on file   Physical Activity: Not on file  Stress: Not on file  Social Connections: Not on file  Intimate Partner Violence: Not on file    History reviewed. No pertinent surgical history.  Family History  Problem Relation Age of Onset  . Hypertension Mother   . Stroke Mother   . Heart disease Father   . Breast cancer Sister   . Prostate cancer Brother   . Hypertension Sister   . Colon cancer Neg Hx   . Esophageal cancer Neg Hx   . Pancreatic cancer Neg Hx   . Rectal cancer Neg Hx   . Stomach cancer Neg Hx     Allergies  Allergen Reactions  . Penicillins     Current Outpatient Medications on File Prior to Visit  Medication Sig Dispense Refill  . amLODipine (NORVASC) 10 MG tablet Take 10 mg by mouth daily.    Marland Kitchen atorvastatin (LIPITOR) 40 MG tablet Take 0.5 tablets (20 mg total) by mouth daily. Pt still has 20 mg, advised to take 2 tablets until finished then start 40 mg 1 tab daily 90 tablet 3  . buPROPion (WELLBUTRIN XL) 300 MG 24 hr tablet Take 1 tablet (300 mg total) by mouth daily. 90 tablet 1  . finasteride (PROSCAR) 5 MG tablet Take  5 mg by mouth daily.    . Fluticasone-Umeclidin-Vilant (TRELEGY ELLIPTA) 100-62.5-25 MCG/INH AEPB Inhale 1 puff into the lungs daily.    . tamsulosin (FLOMAX) 0.4 MG CAPS capsule Take 2 capsules by mouth at bedtime.    . vitamin B-12 (CYANOCOBALAMIN) 1000 MCG tablet Take 1,000 mcg by mouth daily.    Marland Kitchen VITAMIN D PO Take 1,000 Units by mouth daily.     No current facility-administered medications on file prior to visit.    BP 132/82   Temp 97.6 F (36.4 C)   Wt 149 lb (67.6 kg)   BMI 25.58 kg/m       Objective:   Physical Exam Vitals and nursing note reviewed.  Constitutional:      Appearance: Normal appearance.  Cardiovascular:     Rate and Rhythm: Normal rate and regular rhythm.     Pulses: Normal pulses.     Heart sounds: Normal heart sounds.  Pulmonary:     Effort: Pulmonary effort is normal. No respiratory distress.     Breath  sounds: Decreased air movement present. No stridor. No decreased breath sounds, wheezing, rhonchi or rales.  Abdominal:     General: Abdomen is flat. Bowel sounds are normal.     Palpations: Abdomen is soft.  Musculoskeletal:     Right lower leg: No edema.     Left lower leg: No edema.  Skin:    General: Skin is warm and dry.     Comments: Lipoma noted on right upper back   Psychiatric:        Mood and Affect: Mood normal.        Behavior: Behavior normal.        Thought Content: Thought content normal.        Judgment: Judgment normal.       Assessment & Plan:  1. Chronic obstructive pulmonary disease, unspecified COPD type ([place]) -I am wondering if the Trelegy is not lasting long enough.  Samples of Breztri inhaler were given.  Advised 2 puffs twice daily.  We will follow-up and let me know how he is doing in the next week to 10 days. - DG Chest 2 View; Future -If no improvement then can consider low-dose daily prednisone and/or send to pulmonary for further evaluation   2. Lipoma of other specified sites -No pain with palpation  Advise watchful waiting at this time   Dorothyann Peng, NP\

## 2020-06-14 ENCOUNTER — Encounter: Payer: Self-pay | Admitting: Family Medicine

## 2020-06-19 ENCOUNTER — Other Ambulatory Visit: Payer: Self-pay | Admitting: Adult Health

## 2020-06-20 NOTE — Telephone Encounter (Signed)
Can we see how he is doing with the new inhaler prior to sending this one in?  thanks

## 2020-06-20 NOTE — Telephone Encounter (Signed)
Assessment & Plan:  1. Chronic obstructive pulmonary disease, unspecified COPD type (Samuel [name]) -I am wondering if the Trelegy is not lasting long enough.  Samples of Breztri inhaler were given.  Advised 2 puffs twice daily.  We will follow-up and let me know how he is doing in the next week to 10 days. - DG Chest 2 View; Future -If no improvement then can consider low-dose daily prednisone and/or send to pulmonary for further evaluation

## 2020-06-20 NOTE — Telephone Encounter (Signed)
Pt is currently using Breztri.  Will continue with that medication.  Trelegy denied.  Nothing further needed.

## 2020-06-23 ENCOUNTER — Encounter: Payer: Self-pay | Admitting: Adult Health

## 2020-06-29 ENCOUNTER — Telehealth: Payer: Self-pay | Admitting: Adult Health

## 2020-06-29 NOTE — Progress Notes (Signed)
  Chronic Care Management   Note  06/29/2020 Name: Clarkson Rosselli MRN: [id number] DOB: 06-Dec-1950  Lothar Prehn is a 70 y.o. year old male who is a primary care patient of Dorothyann Peng, NP. I reached out to [place] by phone today in response to a referral sent by Mr. Burlene Arnt D'Annunzio's PCP, Dorothyann Peng, NP.   Mr. Marsa Aris was given information about Chronic Care Management services today including:  1. CCM service includes personalized support from designated clinical staff supervised by his physician, including individualized plan of care and coordination with other care providers 2. 24/7 contact phone numbers for assistance for urgent and routine care needs. 3. Service will only be billed when office clinical staff spend 20 minutes or more in a month to coordinate care. 4. Only one practitioner may furnish and bill the service in a calendar month. 5. The patient may stop CCM services at any time (effective at the end of the month) by phone call to the office staff.   Patient agreed to services and verbal consent obtained.   Follow up plan:   Carley Perdue [employer] Scheduler

## 2020-07-20 ENCOUNTER — Encounter: Payer: Self-pay | Admitting: Adult Health

## 2020-07-20 ENCOUNTER — Other Ambulatory Visit: Payer: Self-pay | Admitting: Adult Health

## 2020-07-20 MED ORDER — TRELEGY ELLIPTA 100-62.5-25 MCG/INH IN AEPB
1.0000 | INHALATION_SPRAY | Freq: Every day | RESPIRATORY_TRACT | 5 refills | Status: DC
Start: 2020-07-20 — End: 2020-10-04

## 2020-07-21 ENCOUNTER — Other Ambulatory Visit: Payer: Self-pay | Admitting: Adult Health

## 2020-07-21 DIAGNOSIS — J449 Chronic obstructive pulmonary disease, unspecified: Secondary | ICD-10-CM

## 2020-08-08 ENCOUNTER — Telehealth: Payer: Self-pay | Admitting: Adult Health

## 2020-08-08 NOTE — Progress Notes (Signed)
  Chronic Care Management   Note  08/08/2020 Name: Samuel [name] MRN: [id number] DOB: 1950/12/25  [name] is a 70 y.o. year old male who is a primary care patient of Dorothyann Peng, NP. I reached out to [place] by phone today in response to a referral sent by Samuel [name] PCP, Dorothyann Peng, NP.   Samuel [name] was given information about Chronic Care Management services today including:  1. CCM service includes personalized support from designated clinical staff supervised by his physician, including individualized plan of care and coordination with other care providers 2. 24/7 contact phone numbers for assistance for urgent and routine care needs. 3. Service will only be billed when office clinical staff spend 20 minutes or more in a month to coordinate care. 4. Only one practitioner may furnish and bill the service in a calendar month. 5. The patient may stop CCM services at any time (effective at the end of the month) by phone call to the office staff.   Patient agreed to services and verbal consent obtained.   Follow up plan:   Carley Perdue [employer] Scheduler

## 2020-08-14 DIAGNOSIS — K5730 Diverticulosis of large intestine without perforation or abscess without bleeding: Secondary | ICD-10-CM | POA: Diagnosis not present

## 2020-08-14 DIAGNOSIS — R310 Gross hematuria: Secondary | ICD-10-CM | POA: Diagnosis not present

## 2020-08-14 DIAGNOSIS — I700 Atherosclerosis of aorta: Secondary | ICD-10-CM | POA: Diagnosis not present

## 2020-08-14 DIAGNOSIS — Z87448 Personal history of other diseases of urinary system: Secondary | ICD-10-CM | POA: Diagnosis not present

## 2020-08-15 NOTE — Progress Notes (Signed)
Subjective:   Samuel [name] is a 70 y.o. male who presents for an Initial Medicare Annual Wellness Visit.  Review of Systems    N/A Cardiac Risk Factors include: advanced age (>74men, >50 women);dyslipidemia;hypertension;male gender;smoking/ tobacco exposure     Objective:    Today's Vitals   08/16/20 0826  BP: 122/70  Pulse: 74  Temp: 97.7 F (36.5 C)  SpO2: 95%  Weight: 153 lb (69.4 kg)   Body mass index is 26.26 kg/m.  Advanced Directives 02/04/2017 01/21/2017  Does Patient Have a Medical Advance Directive? No No    Current Medications (verified) Outpatient Encounter Medications as of 08/16/2020  Medication Sig  . amLODipine (NORVASC) 10 MG tablet Take 10 mg by mouth daily.  Marland Kitchen atorvastatin (LIPITOR) 40 MG tablet Take 0.5 tablets (20 mg total) by mouth daily. Pt still has 20 mg, advised to take 2 tablets until finished then start 40 mg 1 tab daily  . buPROPion (WELLBUTRIN XL) 300 MG 24 hr tablet Take 1 tablet (300 mg total) by mouth daily.  . finasteride (PROSCAR) 5 MG tablet Take 5 mg by mouth daily.  . Fluticasone-Umeclidin-Vilant (TRELEGY ELLIPTA) 100-62.5-25 MCG/INH AEPB Inhale 1 puff into the lungs daily.  . tamsulosin (FLOMAX) 0.4 MG CAPS capsule Take 2 capsules by mouth at bedtime.  . vitamin B-12 (CYANOCOBALAMIN) 1000 MCG tablet Take 1,000 mcg by mouth daily.  Marland Kitchen VITAMIN D PO Take 1,000 Units by mouth daily.   No facility-administered encounter medications on file as of 08/16/2020.    Allergies (verified) Penicillins   History: Past Medical History:  Diagnosis Date  . Anxiety   . BPH with obstruction/lower urinary tract symptoms   . Depression   . Essential hypertension   . Hyperlipidemia   . Inflammatory polyps of colon ([place])    History reviewed. No pertinent surgical history. Family History  Problem Relation Age of Onset  . Hypertension Mother   . Stroke Mother   . Heart disease Father   . Breast cancer Sister   . Prostate cancer  Brother   . Hypertension Sister   . Colon cancer Neg Hx   . Esophageal cancer Neg Hx   . Pancreatic cancer Neg Hx   . Rectal cancer Neg Hx   . Stomach cancer Neg Hx    Social History   Socioeconomic History  . Marital status: Married    Spouse name: Not on file  . Number of children: Not on file  . Years of education: Not on file  . Highest education level: Not on file  Occupational History  . Not on file  Tobacco Use  . Smoking status: Former Smoker    Quit date: 01/16/2017    Years since quitting: 3.5  . Smokeless tobacco: Never Used  . Tobacco comment: Trying to quit.   Vaping Use  . Vaping Use: Never used  Substance and Sexual Activity  . Alcohol use: No  . Drug use: No  . Sexual activity: Not on file  Other Topics Concern  . Not on file  Social History Narrative   Retired    Married       He watches television   Social Determinants of Health   Financial Resource Strain: Medium Risk  . Difficulty of Paying Living Expenses: Somewhat hard  Food Insecurity: No Food Insecurity  . Worried About Charity fundraiser in the Last Year: Never true  . Ran Out of Food in the Last Year: Never true  Transportation Needs:  No Transportation Needs  . Lack of Transportation (Medical): No  . Lack of Transportation (Non-Medical): No  Physical Activity: Inactive  . Days of Exercise per Week: 0 days  . Minutes of Exercise per Session: 0 min  Stress: No Stress Concern Present  . Feeling of Stress : Not at all  Social Connections: Moderately Isolated  . Frequency of Communication with Friends and Family: More than three times a week  . Frequency of Social Gatherings with Friends and Family: More than three times a week  . Attends Religious Services: Never  . Active Member of Clubs or Organizations: No  . Attends Archivist Meetings: Never  . Marital Status: Married    Tobacco Counseling Counseling given: Not Answered Comment: Trying to quit.    Clinical  Intake:  Pre-visit preparation completed: Yes  Pain : No/denies pain     Nutritional Risks: None Diabetes: No  How often do you need to have someone help you when you read instructions, pamphlets, or other written materials from your doctor or pharmacy?: 1 - Never What is the last grade level you completed in school?: highschool  Diabetic?no  Interpreter Needed?: No  Information entered by :: l.Keslyn Teater,Lpn   Activities of Daily Living In your present state of health, do you have any difficulty performing the following activities: 08/16/2020  Hearing? N  Vision? N  Difficulty concentrating or making decisions? N  Walking or climbing stairs? N  Dressing or bathing? N  Doing errands, shopping? N  Preparing Food and eating ? N  In the past six months, have you accidently leaked urine? N  Do you have problems with loss of bowel control? N  Managing your Medications? N  Managing your Finances? N  Housekeeping or managing your Housekeeping? N  Some recent data might be hidden    Patient Care Team: Dorothyann Peng, NP as PCP - General (Family Medicine) [place], [place] (Nephrology) Viona Gilmore, [facility] as Pharmacist (Pharmacist)  Indicate any recent Medical Services you may have received from other than [facility] providers in the past year (date may be approximate).     Assessment:   This is a routine wellness examination for [name].  Hearing/Vision screen  Hearing Screening   125Hz  250Hz  500Hz  1000Hz  2000Hz  3000Hz  4000Hz  6000Hz  8000Hz   Right ear:           Left ear:           Vision Screening Comments: Does not but referral sent wears glasses  Dietary issues and exercise activities discussed: Current Exercise Habits: The patient does not participate in regular exercise at present, Exercise limited by: respiratory conditions(s)  Goals    . Exercise 3x per week (30 min per time)      Depression Screen PHQ 2/9 Scores 08/16/2020 03/23/2020 12/19/2017  PHQ - 2 Score 0 0 0     Fall Risk Fall Risk  08/16/2020 03/23/2020 12/19/2017  Falls in the past year? 0 0 No  Number falls in past yr: - 0 -  Injury with Fall? 0 0 -  Follow up Falls evaluation completed - -    FALL RISK PREVENTION PERTAINING TO THE HOME:  Any stairs in or around the home? No  If so, are there any without handrails? No  Home free of loose throw rugs in walkways, pet beds, electrical cords, etc? Yes  Adequate lighting in your home to reduce risk of falls? Yes   ASSISTIVE DEVICES UTILIZED TO PREVENT FALLS:  Life alert? No  Use of a cane, walker or w/c? No  Grab bars in the bathroom? Yes  Shower chair or bench in shower? Yes  Elevated toilet seat or a handicapped toilet? Yes   TIMED UP AND GO:  Was the test performed? Yes .  Length of time to ambulate 10 feet: 7 sec.   Gait steady and fast with assistive device  Cognitive Function:     6CIT score 13    6CIT Screen 08/16/2020  What Year? 0 points  What month? 0 points  What time? 3 points  Count back from 20 0 points  Months in reverse 2 points  Repeat phrase 8 points  Total Score 13              6CIT Screen 08/16/2020  What Year? 0 points  What month? 0 points  What time? 3 points  Count back from 20 0 points  Months in reverse 2 points  Repeat phrase 8 points  Total Score 13        6CIT Screen 08/16/2020  What Year? 0 points  What month? 0 points  What time? 3 points  Count back from 20 0 points  Months in reverse 2 points  Repeat phrase 8 points  Total Score 13    Immunizations Immunization History  Administered Date(s) Administered  . Influenza, High Dose Seasonal PF 02/27/2017  . Pneumococcal Conjugate-13 11/15/2016  . Pneumococcal Polysaccharide-23 12/19/2017    TDAP status: Due, Education has been provided regarding the importance of this vaccine. Advised may receive this vaccine at local pharmacy or Health Dept. Aware to provide a copy of the vaccination record if obtained from local pharmacy or  Health Dept. Verbalized acceptance and understanding.  Flu Vaccine status: Due, Education has been provided regarding the importance of this vaccine. Advised may receive this vaccine at local pharmacy or Health Dept. Aware to provide a copy of the vaccination record if obtained from local pharmacy or Health Dept. Verbalized acceptance and understanding.  Pneumococcal vaccine status: Completed during today's visit.  Covid-19 vaccine status: Information provided on how to obtain vaccines.   Qualifies for Shingles Vaccine? Yes   Zostavax completed No   Shingrix Completed?: No.    Education has been provided regarding the importance of this vaccine. Patient has been advised to call insurance company to determine out of pocket expense if they have not yet received this vaccine. Advised may also receive vaccine at local pharmacy or Health Dept. Verbalized acceptance and understanding.  Screening Tests Health Maintenance  Topic Date Due  . COVID-19 Vaccine (1) Never done  . INFLUENZA VACCINE  12/11/2020  . COLONOSCOPY (Pts 45-78yrs Insurance coverage will need to be confirmed)  02/04/2022  . Hepatitis C Screening  Completed  . PNA vac Low Risk Adult  Completed  . HPV VACCINES  Aged Out  . TETANUS/TDAP  Discontinued    Health Maintenance  Health Maintenance Due  Topic Date Due  . COVID-19 Vaccine (1) Never done    Colorectal cancer screening: Type of screening: Colonoscopy. Completed 02/04/2017. Repeat every 5 years  Lung Cancer Screening: (Low Dose CT Chest recommended if Age 38-80 years, 30 pack-year currently smoking OR have quit w/in 15years.) does qualify.   Lung Cancer Screening Referral: yes   Additional Screening:  Hepatitis C Screening: does qualify  Vision Screening: Recommended annual ophthalmology exams for early detection of glaucoma and other disorders of the eye. Is the patient up to date with their annual eye exam?  No  Who is the provider or what is the name of  the office in which the patient attends annual eye exams? Referral If pt is not established with a provider, would they like to be referred to a provider to establish care? Yes .   Dental Screening: Recommended annual dental exams for proper oral hygiene  Community Resource Referral / Chronic Care Management: CRR required this visit?  No   CCM required this visit?  No      Plan:     I have personally reviewed and noted the following in the patient's chart:   . Medical and social history . Use of alcohol, tobacco or illicit drugs  . Current medications and supplements . Functional ability and status . Nutritional status . Physical activity . Advanced directives . List of other physicians . Hospitalizations, surgeries, and ER visits in previous 12 months . Vitals . Screenings to include cognitive, depression, and falls . Referrals and appointments  In addition, I have reviewed and discussed with patient certain preventive protocols, quality metrics, and best practice recommendations. A written personalized care plan for preventive services as well as general preventive health recommendations were provided to patient.     Randel Pigg, LPN   01/15/1739   Nurse Notes: None

## 2020-08-16 ENCOUNTER — Ambulatory Visit (INDEPENDENT_AMBULATORY_CARE_PROVIDER_SITE_OTHER): Payer: Medicare Other

## 2020-08-16 ENCOUNTER — Other Ambulatory Visit: Payer: Self-pay

## 2020-08-16 VITALS — BP 122/70 | HR 74 | Temp 97.7°F | Wt 153.0 lb

## 2020-08-16 DIAGNOSIS — Z0000 Encounter for general adult medical examination without abnormal findings: Secondary | ICD-10-CM | POA: Diagnosis not present

## 2020-08-16 DIAGNOSIS — Z122 Encounter for screening for malignant neoplasm of respiratory organs: Secondary | ICD-10-CM | POA: Diagnosis not present

## 2020-08-16 DIAGNOSIS — Z0100 Encounter for examination of eyes and vision without abnormal findings: Secondary | ICD-10-CM

## 2020-08-16 NOTE — Patient Instructions (Signed)
[name] , Thank you for taking time to come for your Medicare Wellness Visit. I appreciate your ongoing commitment to your health goals. Please review the following plan we discussed and let me know if I can assist you in the future.   Screening recommendations/referrals: Colonoscopy: Current due 02/04/2022 Recommended yearly ophthalmology/optometry visit for glaucoma screening and checkup Recommended yearly dental visit for hygiene and checkup  Vaccinations: Influenza vaccine: declined Pneumococcal vaccine: Completed series  Tdap vaccine: declined Shingles vaccine: declined  Advanced directives: Will bring in copies with next office visit   Conditions/risks identified: cognitive test 6CIT  13   Next appointment: none   Preventive Care 65 Years and Older, Male Preventive care refers to lifestyle choices and visits with your health care provider that can promote health and wellness. What does preventive care include?  A yearly physical exam. This is also called an annual well check.  Dental exams once or twice a year.  Routine eye exams. Ask your health care provider how often you should have your eyes checked.  Personal lifestyle choices, including:  Daily care of your teeth and gums.  Regular physical activity.  Eating a healthy diet.  Avoiding tobacco and drug use.  Limiting alcohol use.  Practicing safe sex.  Taking low doses of aspirin every day.  Taking vitamin and mineral supplements as recommended by your health care provider. What happens during an annual well check? The services and screenings done by your health care provider during your annual well check will depend on your age, overall health, lifestyle risk factors, and family history of disease. Counseling  Your health care provider may ask you questions about your:  Alcohol use.  Tobacco use.  Drug use.  Emotional well-being.  Home and relationship well-being.  Sexual  activity.  Eating habits.  History of falls.  Memory and ability to understand (cognition).  Work and work Statistician. Screening  You may have the following tests or measurements:  Height, weight, and BMI.  Blood pressure.  Lipid and cholesterol levels. These may be checked every 5 years, or more frequently if you are over 20 years old.  Skin check.  Lung cancer screening. You may have this screening every year starting at age 69 if you have a 30-pack-year history of smoking and currently smoke or have quit within the past 15 years.  Fecal occult blood test (FOBT) of the stool. You may have this test every year starting at age 107.  Flexible sigmoidoscopy or colonoscopy. You may have a sigmoidoscopy every 5 years or a colonoscopy every 10 years starting at age 65.  Prostate cancer screening. Recommendations will vary depending on your family history and other risks.  Hepatitis C blood test.  Hepatitis B blood test.  Sexually transmitted disease (STD) testing.  Diabetes screening. This is done by checking your blood sugar (glucose) after you have not eaten for a while (fasting). You may have this done every 1-3 years.  Abdominal aortic aneurysm (AAA) screening. You may need this if you are a current or former smoker.  Osteoporosis. You may be screened starting at age 52 if you are at high risk. Talk with your health care provider about your test results, treatment options, and if necessary, the need for more tests. Vaccines  Your health care provider may recommend certain vaccines, such as:  Influenza vaccine. This is recommended every year.  Tetanus, diphtheria, and acellular pertussis (Tdap, Td) vaccine. You may need a Td booster every 10 years.  Zoster  vaccine. You may need this after age 63.  Pneumococcal 13-valent conjugate (PCV13) vaccine. One dose is recommended after age 72.  Pneumococcal polysaccharide (PPSV23) vaccine. One dose is recommended after age  62. Talk to your health care provider about which screenings and vaccines you need and how often you need them. This information is not intended to replace advice given to you by your health care provider. Make sure you discuss any questions you have with your health care provider. Document Released: 05/26/2015 Document Revised: 01/17/2016 Document Reviewed: 02/28/2015 Elsevier Interactive Patient Education  2017 [place] Prevention in the Home Falls can cause injuries. They can happen to people of all ages. There are many things you can do to make your home safe and to help prevent falls. What can I do on the outside of my home?  Regularly fix the edges of walkways and driveways and fix any cracks.  Remove anything that might make you trip as you walk through a door, such as a raised step or threshold.  Trim any bushes or trees on the path to your home.  Use bright outdoor lighting.  Clear any walking paths of anything that might make someone trip, such as rocks or tools.  Regularly check to see if handrails are loose or broken. Make sure that both sides of any steps have handrails.  Any raised decks and porches should have guardrails on the edges.  Have any leaves, snow, or ice cleared regularly.  Use sand or salt on walking paths during winter.  Clean up any spills in your garage right away. This includes oil or grease spills. What can I do in the bathroom?  Use night lights.  Install grab bars by the toilet and in the tub and shower. Do not use towel bars as grab bars.  Use non-skid mats or decals in the tub or shower.  If you need to sit down in the shower, use a plastic, non-slip stool.  Keep the floor dry. Clean up any water that spills on the floor as soon as it happens.  Remove soap buildup in the tub or shower regularly.  Attach bath mats securely with double-sided non-slip rug tape.  Do not have throw rugs and other things on the floor that can make  you trip. What can I do in the bedroom?  Use night lights.  Make sure that you have a light by your bed that is easy to reach.  Do not use any sheets or blankets that are too big for your bed. They should not hang down onto the floor.  Have a firm chair that has side arms. You can use this for support while you get dressed.  Do not have throw rugs and other things on the floor that can make you trip. What can I do in the kitchen?  Clean up any spills right away.  Avoid walking on wet floors.  Keep items that you use a lot in easy-to-reach places.  If you need to reach something above you, use a strong step stool that has a grab bar.  Keep electrical cords out of the way.  Do not use floor polish or wax that makes floors slippery. If you must use wax, use non-skid floor wax.  Do not have throw rugs and other things on the floor that can make you trip. What can I do with my stairs?  Do not leave any items on the stairs.  Make sure that there are handrails  on both sides of the stairs and use them. Fix handrails that are broken or loose. Make sure that handrails are as long as the stairways.  Check any carpeting to make sure that it is firmly attached to the stairs. Fix any carpet that is loose or worn.  Avoid having throw rugs at the top or bottom of the stairs. If you do have throw rugs, attach them to the floor with carpet tape.  Make sure that you have a light switch at the top of the stairs and the bottom of the stairs. If you do not have them, ask someone to add them for you. What else can I do to help prevent falls?  Wear shoes that:  Do not have high heels.  Have rubber bottoms.  Are comfortable and fit you well.  Are closed at the toe. Do not wear sandals.  If you use a stepladder:  Make sure that it is fully opened. Do not climb a closed stepladder.  Make sure that both sides of the stepladder are locked into place.  Ask someone to hold it for you, if  possible.  Clearly mark and make sure that you can see:  Any grab bars or handrails.  First and last steps.  Where the edge of each step is.  Use tools that help you move around (mobility aids) if they are needed. These include:  Canes.  Walkers.  Scooters.  Crutches.  Turn on the lights when you go into a dark area. Replace any light bulbs as soon as they burn out.  Set up your furniture so you have a clear path. Avoid moving your furniture around.  If any of your floors are uneven, fix them.  If there are any pets around you, be aware of where they are.  Review your medicines with your doctor. Some medicines can make you feel dizzy. This can increase your chance of falling. Ask your doctor what other things that you can do to help prevent falls. This information is not intended to replace advice given to you by your health care provider. Make sure you discuss any questions you have with your health care provider. Document Released: 02/23/2009 Document Revised: 10/05/2015 Document Reviewed: 06/03/2014 Elsevier Interactive Patient Education  2017 [employer].

## 2020-08-21 ENCOUNTER — Other Ambulatory Visit: Payer: Self-pay | Admitting: *Deleted

## 2020-08-21 DIAGNOSIS — Z87891 Personal history of nicotine dependence: Secondary | ICD-10-CM

## 2020-08-24 DIAGNOSIS — C678 Malignant neoplasm of overlapping sites of bladder: Secondary | ICD-10-CM | POA: Diagnosis not present

## 2020-08-24 DIAGNOSIS — N281 Cyst of kidney, acquired: Secondary | ICD-10-CM | POA: Diagnosis not present

## 2020-08-24 DIAGNOSIS — R310 Gross hematuria: Secondary | ICD-10-CM | POA: Diagnosis not present

## 2020-08-24 DIAGNOSIS — R3915 Urgency of urination: Secondary | ICD-10-CM | POA: Diagnosis not present

## 2020-08-29 ENCOUNTER — Other Ambulatory Visit: Payer: Self-pay | Admitting: Urology

## 2020-09-01 ENCOUNTER — Ambulatory Visit: Payer: Medicare Other

## 2020-09-05 ENCOUNTER — Encounter: Payer: Self-pay | Admitting: Adult Health

## 2020-09-11 ENCOUNTER — Encounter (HOSPITAL_BASED_OUTPATIENT_CLINIC_OR_DEPARTMENT_OTHER): Payer: Self-pay | Admitting: Urology

## 2020-09-11 ENCOUNTER — Other Ambulatory Visit: Payer: Self-pay

## 2020-09-11 NOTE — Progress Notes (Signed)
Spoke w/ via phone for pre-op interview--- PT Lab needs dos----  Istat and EKG            Lab results------ current cxr in epic/ chart COVID test ------ 09-12-2020 @ 0845 Arrive at ------- 0630 on 09-15-2020 NPO after MN NO Solid Food.  Clear liquids from MN until--- 0530 Med rec completed Medications to take morning of surgery ----- Wellbutrin, Metoprolol, Lipitor, Norvasc, Finasteride Diabetic medication ----- n/a Patient instructed to bring photo id and insurance card day of surgery Patient aware to have Driver (ride ) / caregiver    for 24 hours after surgery -- wife, Dorothy Patient Special Instructions ----- n/a Pre-Op special Istructions ----- n/a Patient verbalized understanding of instructions that were given at this phone interview. Patient denies shortness of breath, chest pain, fever, cough at this phone interview.  Anesthesia:  HTN:  COPD;  Pt stated sob w/ exertion, no issue laying down or sitting, occasional wheezing, and stated has never had URI or issue with his copd.  Current CXR in epic 06-08-2020. Pt hx smoking for 58 yrs , quit 2018.

## 2020-09-12 ENCOUNTER — Other Ambulatory Visit (HOSPITAL_COMMUNITY)
Admission: RE | Admit: 2020-09-12 | Discharge: 2020-09-12 | Disposition: A | Discharge status: Home / Self Care | Payer: Medicare Other | Source: Ambulatory Visit | Attending: Urology | Admitting: Urology

## 2020-09-12 DIAGNOSIS — Z20822 Contact with and (suspected) exposure to covid-19: Secondary | ICD-10-CM | POA: Diagnosis not present

## 2020-09-12 DIAGNOSIS — Z01812 Encounter for preprocedural laboratory examination: Secondary | ICD-10-CM | POA: Diagnosis not present

## 2020-09-12 LAB — SARS CORONAVIRUS 2 (TAT 6-24 HRS): SARS Coronavirus 2: NEGATIVE

## 2020-09-14 NOTE — Anesthesia Preprocedure Evaluation (Addendum)
Anesthesia Evaluation  Patient identified by MRN, date of birth, ID band Patient awake    Reviewed: Allergy & Precautions, NPO status , Patient's Chart, lab work & pertinent test results, reviewed documented beta blocker date and time   History of Anesthesia Complications Negative for: history of anesthetic complications  Airway Mallampati: II  TM Distance: >3 FB Neck ROM: Full    Dental  (+) Poor Dentition, Loose, Dental Advisory Given   Pulmonary COPD, former smoker,    Pulmonary exam normal        Cardiovascular hypertension, Pt. on medications and Pt. on home beta blockers Normal cardiovascular exam     Neuro/Psych negative neurological ROS     GI/Hepatic negative GI ROS, Neg liver ROS,   Endo/Other  negative endocrine ROS  Renal/GU Renal InsufficiencyRenal disease     Musculoskeletal negative musculoskeletal ROS (+)   Abdominal   Peds  Hematology negative hematology ROS (+)   Anesthesia Other Findings   Reproductive/Obstetrics                           Anesthesia Physical Anesthesia Plan  ASA: III  Anesthesia Plan: General   Post-op Pain Management:    Induction: Intravenous  PONV Risk Score and Plan: 4 or greater and Treatment may vary due to age or medical condition, Ondansetron and Dexamethasone  Airway Management Planned: LMA  Additional Equipment:   Intra-op Plan:   Post-operative Plan: Extubation in OR  Informed Consent: I have reviewed the patients History and Physical, chart, labs and discussed the procedure including the risks, benefits and alternatives for the proposed anesthesia with the patient or authorized representative who has indicated his/her understanding and acceptance.     Dental advisory given  Plan Discussed with: Anesthesiologist and CRNA  Anesthesia Plan Comments:        Anesthesia Quick Evaluation

## 2020-09-15 ENCOUNTER — Ambulatory Visit (HOSPITAL_BASED_OUTPATIENT_CLINIC_OR_DEPARTMENT_OTHER): Payer: Medicare Other | Admitting: Anesthesiology

## 2020-09-15 ENCOUNTER — Encounter (HOSPITAL_BASED_OUTPATIENT_CLINIC_OR_DEPARTMENT_OTHER): Payer: Self-pay | Admitting: Urology

## 2020-09-15 ENCOUNTER — Ambulatory Visit (HOSPITAL_BASED_OUTPATIENT_CLINIC_OR_DEPARTMENT_OTHER)
Admission: RE | Admit: 2020-09-15 | Discharge: 2020-09-15 | Disposition: A | Discharge status: Home / Self Care | Payer: Medicare Other | Attending: Urology | Admitting: Urology

## 2020-09-15 ENCOUNTER — Other Ambulatory Visit: Payer: Self-pay

## 2020-09-15 ENCOUNTER — Encounter (HOSPITAL_BASED_OUTPATIENT_CLINIC_OR_DEPARTMENT_OTHER)
Admission: RE | Disposition: A | Discharge status: Home / Self Care | Payer: Self-pay | Source: Home / Self Care | Attending: Urology

## 2020-09-15 DIAGNOSIS — E785 Hyperlipidemia, unspecified: Secondary | ICD-10-CM | POA: Diagnosis not present

## 2020-09-15 DIAGNOSIS — I10 Essential (primary) hypertension: Secondary | ICD-10-CM | POA: Insufficient documentation

## 2020-09-15 DIAGNOSIS — N3289 Other specified disorders of bladder: Secondary | ICD-10-CM | POA: Diagnosis not present

## 2020-09-15 DIAGNOSIS — R310 Gross hematuria: Secondary | ICD-10-CM | POA: Insufficient documentation

## 2020-09-15 DIAGNOSIS — F419 Anxiety disorder, unspecified: Secondary | ICD-10-CM | POA: Insufficient documentation

## 2020-09-15 DIAGNOSIS — Z87891 Personal history of nicotine dependence: Secondary | ICD-10-CM | POA: Insufficient documentation

## 2020-09-15 DIAGNOSIS — D090 Carcinoma in situ of bladder: Secondary | ICD-10-CM | POA: Diagnosis not present

## 2020-09-15 DIAGNOSIS — J449 Chronic obstructive pulmonary disease, unspecified: Secondary | ICD-10-CM | POA: Diagnosis not present

## 2020-09-15 DIAGNOSIS — C679 Malignant neoplasm of bladder, unspecified: Secondary | ICD-10-CM | POA: Diagnosis present

## 2020-09-15 DIAGNOSIS — N138 Other obstructive and reflux uropathy: Secondary | ICD-10-CM | POA: Diagnosis not present

## 2020-09-15 HISTORY — DX: Feeling of incomplete bladder emptying: R39.14

## 2020-09-15 HISTORY — DX: Malignant neoplasm of bladder, unspecified: C67.9

## 2020-09-15 HISTORY — DX: Chronic obstructive pulmonary disease, unspecified: J44.9

## 2020-09-15 HISTORY — DX: Chronic kidney disease, stage 3 unspecified: N18.30

## 2020-09-15 HISTORY — DX: Dyspnea, unspecified: R06.00

## 2020-09-15 HISTORY — PX: TRANSURETHRAL RESECTION OF BLADDER TUMOR: SHX2575

## 2020-09-15 HISTORY — DX: Cyst of kidney, acquired: N28.1

## 2020-09-15 HISTORY — DX: Presence of spectacles and contact lenses: Z97.3

## 2020-09-15 HISTORY — DX: Congenital deformity of finger(s) and hand: Q68.1

## 2020-09-15 HISTORY — DX: Hematuria, unspecified: R31.9

## 2020-09-15 HISTORY — DX: Personal history of adenomatous and serrated colon polyps: Z86.0101

## 2020-09-15 HISTORY — PX: CYSTOSCOPY W/ RETROGRADES: SHX1426

## 2020-09-15 HISTORY — DX: Personal history of colonic polyps: Z86.010

## 2020-09-15 LAB — POCT I-STAT, CHEM 8
BUN: 42 mg/dL — ABNORMAL HIGH (ref 8–23)
Calcium, Ion: 1.28 mmol/L (ref 1.15–1.40)
Chloride: 107 mmol/L (ref 98–111)
Creatinine, Ser: 2.1 mg/dL — ABNORMAL HIGH (ref 0.61–1.24)
Glucose, Bld: 120 mg/dL — ABNORMAL HIGH (ref 70–99)
HCT: 41 % (ref 39.0–52.0)
Hemoglobin: 13.9 g/dL (ref 13.0–17.0)
Potassium: 4.4 mmol/L (ref 3.5–5.1)
Sodium: 141 mmol/L (ref 135–145)
TCO2: 23 mmol/L (ref 22–32)

## 2020-09-15 SURGERY — TURBT (TRANSURETHRAL RESECTION OF BLADDER TUMOR)
Anesthesia: General

## 2020-09-15 MED ORDER — ONDANSETRON HCL 4 MG/2ML IJ SOLN
INTRAMUSCULAR | Status: DC | PRN
  Administered 2020-09-15: 4 mg via INTRAVENOUS

## 2020-09-15 MED ORDER — SODIUM CHLORIDE 0.9 % IV SOLN
1.0000 g | INTRAVENOUS | Status: AC
  Administered 2020-09-15: 1 g via INTRAVENOUS
  Filled 2020-09-15 (×2): qty 10

## 2020-09-15 MED ORDER — ACETAMINOPHEN 500 MG PO TABS
ORAL_TABLET | ORAL | Status: AC
  Filled 2020-09-15: qty 2

## 2020-09-15 MED ORDER — SODIUM CHLORIDE 0.9 % IR SOLN
Status: DC | PRN
  Administered 2020-09-15: 6000 mL via INTRAVESICAL

## 2020-09-15 MED ORDER — FENTANYL CITRATE (PF) 100 MCG/2ML IJ SOLN
INTRAMUSCULAR | Status: AC
  Filled 2020-09-15: qty 2

## 2020-09-15 MED ORDER — DEXAMETHASONE SODIUM PHOSPHATE 10 MG/ML IJ SOLN
INTRAMUSCULAR | Status: AC
  Filled 2020-09-15: qty 1

## 2020-09-15 MED ORDER — TRAMADOL HCL 50 MG PO TABS: 50.0000 mg | ORAL_TABLET | Freq: Four times a day (QID) | ORAL | 0 refills | Status: DC | PRN

## 2020-09-15 MED ORDER — CEFTRIAXONE SODIUM 2 G IJ SOLR
INTRAMUSCULAR | Status: AC
  Filled 2020-09-15: qty 20

## 2020-09-15 MED ORDER — FENTANYL CITRATE (PF) 100 MCG/2ML IJ SOLN
INTRAMUSCULAR | Status: DC | PRN
  Administered 2020-09-15 (×2): 50 ug via INTRAVENOUS

## 2020-09-15 MED ORDER — ACETAMINOPHEN 500 MG PO TABS
1000.0000 mg | ORAL_TABLET | Freq: Once | ORAL | Status: AC
  Administered 2020-09-15: 1000 mg via ORAL

## 2020-09-15 MED ORDER — EPHEDRINE SULFATE-NACL 50-0.9 MG/10ML-% IV SOSY
PREFILLED_SYRINGE | INTRAVENOUS | Status: DC | PRN
  Administered 2020-09-15: 5 mg via INTRAVENOUS
  Administered 2020-09-15 (×4): 10 mg via INTRAVENOUS
  Administered 2020-09-15: 5 mg via INTRAVENOUS

## 2020-09-15 MED ORDER — SODIUM CHLORIDE 0.9 % IV SOLN
INTRAVENOUS | Status: AC
  Filled 2020-09-15: qty 100

## 2020-09-15 MED ORDER — SENNOSIDES-DOCUSATE SODIUM 8.6-50 MG PO TABS: 1.0000 | ORAL_TABLET | Freq: Two times a day (BID) | ORAL | 0 refills | Status: DC

## 2020-09-15 MED ORDER — CEPHALEXIN 500 MG PO CAPS: 500.0000 mg | ORAL_CAPSULE | Freq: Two times a day (BID) | ORAL | 0 refills | Status: DC

## 2020-09-15 MED ORDER — SODIUM CHLORIDE 0.9 % IV SOLN: INTRAVENOUS | Status: DC

## 2020-09-15 MED ORDER — CEFTRIAXONE SODIUM 1 G IJ SOLR
INTRAMUSCULAR | Status: AC
  Filled 2020-09-15: qty 10

## 2020-09-15 MED ORDER — PROPOFOL 10 MG/ML IV BOLUS
INTRAVENOUS | Status: DC | PRN
  Administered 2020-09-15: 150 mg via INTRAVENOUS
  Administered 2020-09-15: 30 mg via INTRAVENOUS

## 2020-09-15 MED ORDER — ONDANSETRON HCL 4 MG/2ML IJ SOLN
INTRAMUSCULAR | Status: AC
  Filled 2020-09-15: qty 2

## 2020-09-15 MED ORDER — EPHEDRINE 5 MG/ML INJ
INTRAVENOUS | Status: AC
  Filled 2020-09-15: qty 10

## 2020-09-15 MED ORDER — LIDOCAINE 2% (20 MG/ML) 5 ML SYRINGE
INTRAMUSCULAR | Status: AC
  Filled 2020-09-15: qty 5

## 2020-09-15 MED ORDER — PROPOFOL 10 MG/ML IV BOLUS
INTRAVENOUS | Status: AC
  Filled 2020-09-15: qty 20

## 2020-09-15 MED ORDER — DEXAMETHASONE SODIUM PHOSPHATE 4 MG/ML IJ SOLN
INTRAMUSCULAR | Status: DC | PRN
  Administered 2020-09-15: 10 mg via INTRAVENOUS

## 2020-09-15 MED ORDER — LIDOCAINE HCL (CARDIAC) PF 50 MG/5ML IV SOSY
PREFILLED_SYRINGE | INTRAVENOUS | Status: DC | PRN
  Administered 2020-09-15: 100 mg via INTRAVENOUS

## 2020-09-15 SURGICAL SUPPLY — 32 items
BAG DRAIN URO-CYSTO SKYTR STRL (DRAIN) ×3 IMPLANT
BAG DRN RND TRDRP ANRFLXCHMBR (UROLOGICAL SUPPLIES) ×2
BAG DRN UROCATH (DRAIN) ×2
BAG URINE DRAIN 2000ML AR STRL (UROLOGICAL SUPPLIES) ×1 IMPLANT
BAG URINE LEG 500ML (DRAIN) IMPLANT
CATH FOLEY 2WAY SLVR  5CC 22FR (CATHETERS)
CATH FOLEY 2WAY SLVR 30CC 20FR (CATHETERS) IMPLANT
CATH FOLEY 2WAY SLVR 5CC 22FR (CATHETERS) IMPLANT
CATH FOLEY 3WAY 30CC 24FR (CATHETERS) ×3
CATH INTERMIT  6FR 70CM (CATHETERS) ×3 IMPLANT
CATH URTH STD 24FR FL 3W 2 (CATHETERS) IMPLANT
CLOTH BEACON ORANGE TIMEOUT ST (SAFETY) ×3 IMPLANT
ELECT REM PT RETURN 9FT ADLT (ELECTROSURGICAL) ×3
ELECTRODE REM PT RTRN 9FT ADLT (ELECTROSURGICAL) ×2 IMPLANT
EVACUATOR MICROVAS BLADDER (UROLOGICAL SUPPLIES) IMPLANT
GLOVE SURG ENC MOIS LTX SZ7.5 (GLOVE) ×3 IMPLANT
GOWN STRL REUS W/TWL LRG LVL3 (GOWN DISPOSABLE) ×3 IMPLANT
GUIDEWIRE ANG ZIPWIRE 038X150 (WIRE) IMPLANT
GUIDEWIRE STR DUAL SENSOR (WIRE) IMPLANT
IV NS 1000ML (IV SOLUTION) ×3
IV NS 1000ML BAXH (IV SOLUTION) ×2 IMPLANT
IV NS IRRIG 3000ML ARTHROMATIC (IV SOLUTION) ×6 IMPLANT
KIT TURNOVER CYSTO (KITS) ×3 IMPLANT
LOOP CUT BIPOLAR 24F LRG (ELECTROSURGICAL) IMPLANT
MANIFOLD NEPTUNE II (INSTRUMENTS) ×3 IMPLANT
NS IRRIG 500ML POUR BTL (IV SOLUTION) ×3 IMPLANT
PACK CYSTO (CUSTOM PROCEDURE TRAY) ×3 IMPLANT
SYR 10ML LL (SYRINGE) IMPLANT
SYR 30ML LL (SYRINGE) ×1 IMPLANT
SYR TOOMEY IRRIG 70ML (MISCELLANEOUS)
SYRINGE TOOMEY IRRIG 70ML (MISCELLANEOUS) IMPLANT
TUBE CONNECTING 12X1/4 (SUCTIONS) IMPLANT

## 2020-09-15 NOTE — Brief Op Note (Signed)
09/15/2020  8:44 AM  PATIENT:  Samuel [name]  69 y.o. male  PRE-OPERATIVE DIAGNOSIS:  BLADDER CANCER  POST-OPERATIVE DIAGNOSIS:  BLADDER CANCER  PROCEDURE:  Procedure(s) with comments: BLADDER BIOPSY FULGERATION (N/A) - 8 MINS CYSTOSCOPY WITH RETROGRADE PYELOGRAM (Bilateral)  SURGEON:  Surgeon(s) and Role:    * Alexis Frock, MD - Primary  PHYSICIAN ASSISTANT:   ASSISTANTS: none   ANESTHESIA:   general  EBL:  7mL   BLOOD ADMINISTERED:none  DRAINS: none   LOCAL MEDICATIONS USED:  NONE  SPECIMEN:  Source of Specimen:  bladder erythema  DISPOSITION OF SPECIMEN:  PATHOLOGY  COUNTS:  YES  TOURNIQUET:  * No tourniquets in log *  DICTATION: .Other Dictation: Dictation Number [id number]  PLAN OF CARE: Discharge to home after PACU  PATIENT DISPOSITION:  PACU - hemodynamically stable.   Delay start of Pharmacological VTE agent (>24hrs) due to surgical blood loss or risk of bleeding: yes

## 2020-09-15 NOTE — Transfer of Care (Signed)
Immediate Anesthesia Transfer of Care Note  Patient: Samuel [name]  Procedure(s) Performed: BLADDER BIOPSY FULGERATION (N/A ) CYSTOSCOPY WITH RETROGRADE PYELOGRAM (Bilateral )  Patient Location: PACU  Anesthesia Type:General  Level of Consciousness: drowsy, patient cooperative and responds to stimulation  Airway & Oxygen Therapy: Patient Spontanous Breathing and Patient connected to face mask oxygen  Post-op Assessment: Report given to RN and Post -op Vital signs reviewed and stable  Post vital signs: Reviewed and stable  Last Vitals:  Vitals Value Taken Time  BP    Temp    Pulse    Resp    SpO2      Last Pain:  Vitals:   09/15/20 0705  TempSrc: Oral  PainSc: 0-No pain      Patients Stated Pain Goal: 5 (27/07/86 7544)  Complications: No complications documented.

## 2020-09-15 NOTE — H&P (Signed)
Samuel [name] is an 70 y.o. male.    Chief Complaint: Pre-Op Transurethral Resection of Bladder Tumor  HPI:   1 - Lower Urinary Tract Symptoms / Urinary Urgency / Nocturia / Incomplete Emptying- Progressive bother from mix of irritative and obstructive symptoms with weak stream, post=void dribble and urinary urgency. Prostate Vol 40mL (NOT enlarged) by CT ellipsoid calculation 08/2020. PVR 05/2020 "343mL" (moderate elevatio). On tamsulosin at baseline. 50PY smoker. Restarting finasteride and double dose tamsulosin 05/2020.   2 - Gross Hematuria / Bladder Cancer -gross blood noted by wife late 2021 and x several. Prior 30 PY smoker. Cytology 2019 non-specific. Non-con CT 08/2020 with few equivocal cystic areas. Cysto 08/2020 wit likely early bladder cancer / and carcinoma in situ.   4 - Stage 3b Renal Insuficinecy - Cr 1.8's at baseline. CT 2022 no hdyro.   PMH sig for HTN, HLD, Anxiety/SNRI, COPD/Inhailers (no O2, not limiting), congenital Left arm defect. His PCP is Dorothyann Peng NP with [facility].   Today " Samuel [name] " is seen for cysto to complete hematuria eval. Interval non-con Ct with scattered small renal cysts, few with some complexity. Cysto today with small voluem bladder cancer. C19 screnn negative. Most recent UCX negative.    Past Medical History:  Diagnosis Date  . Anxiety   . Bladder cancer [facility])    urologist--- dr Tresa Moore  . BPH with obstruction/lower urinary tract symptoms   . CKD (chronic kidney disease), stage III ([place])   . Congenital deformity of left hand   . COPD (chronic obstructive pulmonary disease) ([place])    followed by pcp  . Depression   . DOE (dyspnea on exertion)   . Essential hypertension    followed by pcp  . Feeling of incomplete bladder emptying   . Hematuria   . History of adenomatous polyp of colon   . Hyperlipidemia   . Renal cysts, acquired, bilateral   . Wears glasses     Past Surgical History:  Procedure Laterality Date  . COLONOSCOPY WITH  PROPOFOL  last one 02-04-2017  dr Henrene Pastor  . NO PAST SURGERIES      Family History  Problem Relation Age of Onset  . Hypertension Mother   . Stroke Mother   . Heart disease Father   . Breast cancer Sister   . Prostate cancer Brother   . Hypertension Sister   . Colon cancer Neg Hx   . Esophageal cancer Neg Hx   . Pancreatic cancer Neg Hx   . Rectal cancer Neg Hx   . Stomach cancer Neg Hx    Social History:  reports that he quit smoking about 3 years ago. His smoking use included cigarettes. He quit after 58.00 years of use. He has never used smokeless tobacco. He reports that he does not drink alcohol and does not use drugs.  Allergies:  Allergies  Allergen Reactions  . Penicillins Nausea And Vomiting    Medications Prior to Admission  Medication Sig Dispense Refill  . amLODipine (NORVASC) 10 MG tablet Take 10 mg by mouth daily.    Marland Kitchen atorvastatin (LIPITOR) 40 MG tablet Take 0.5 tablets (20 mg total) by mouth daily. Pt still has 20 mg, advised to take 2 tablets until finished then start 40 mg 1 tab daily 90 tablet 3  . buPROPion (WELLBUTRIN XL) 300 MG 24 hr tablet Take 1 tablet (300 mg total) by mouth daily. (Patient taking differently: Take 300 mg by mouth daily.) 90 tablet 1  . cephALEXin (KEFLEX)  500 MG capsule Take 500 mg by mouth as directed. Per pt to start taking 2 days prior to surgery on 09-15-2020    . Cholecalciferol (VITAMIN D3) 25 MCG (1000 UT) CAPS Take 1 capsule by mouth daily.    . finasteride (PROSCAR) 5 MG tablet Take 5 mg by mouth daily.    . Fluticasone-Umeclidin-Vilant (TRELEGY ELLIPTA) 100-62.5-25 MCG/INH AEPB Inhale 1 puff into the lungs daily. 28 each 5  . metoprolol succinate (TOPROL-XL) 25 MG 24 hr tablet Take 25 mg by mouth daily.    . tamsulosin (FLOMAX) 0.4 MG CAPS capsule Take 2 capsules by mouth at bedtime.    . vitamin B-12 (CYANOCOBALAMIN) 1000 MCG tablet Take 1,000 mcg by mouth daily.    Marland Kitchen acetaminophen (TYLENOL) 500 MG tablet Take 500 mg by mouth  every 6 (six) hours as needed.      Results for orders placed or performed during the hospital encounter of 09/15/20 (from the past 48 hour(s))  I-STAT, chem 8     Status: Abnormal   Collection Time: 09/15/20  6:59 AM  Result Value Ref Range   Sodium 141 135 - 145 mmol/L   Potassium 4.4 3.5 - 5.1 mmol/L   Chloride 107 98 - 111 mmol/L   BUN 42 (H) 8 - 23 mg/dL   Creatinine, Ser 2.10 (H) 0.61 - 1.24 mg/dL   Glucose, Bld 120 (H) 70 - 99 mg/dL    Comment: Glucose reference range applies only to samples taken after fasting for at least 8 hours.   Calcium, Ion 1.28 1.15 - 1.40 mmol/L   TCO2 23 22 - 32 mmol/L   Hemoglobin 13.9 13.0 - 17.0 g/dL   HCT 41.0 39.0 - 52.0 %   No results found.  Review of Systems  Constitutional: Negative for chills and fever.  Genitourinary: Positive for hematuria and urgency.    Blood pressure 134/68, pulse 77, temperature 97.7 F (36.5 C), temperature source Oral, resp. rate 16, height 5\' 4"  (1.626 m), weight 67.5 kg, SpO2 99 %. Physical Exam Vitals reviewed.  HENT:     Head: Normocephalic.     Mouth/Throat:     Mouth: Mucous membranes are moist.  Cardiovascular:     Rate and Rhythm: Normal rate.  Abdominal:     General: Abdomen is flat.  Genitourinary:    Comments: No CVAT at present Musculoskeletal:     Cervical back: Normal range of motion.     Comments: stabel LUE hypoplasia  Skin:    General: Skin is warm.     Capillary Refill: Capillary refill takes less than 2 seconds.  Neurological:     General: No focal deficit present.     Mental Status: He is alert.      Assessment/Plan  Proceed as planned with cysto, bilateral retrogrades, TURBT for diagnostic and therapeutic intent. Risks, benefits, alternatives, expected peri-op course discussed previously and reiterated today.   Alexis Frock, MD 09/15/2020, 7:16 AM

## 2020-09-15 NOTE — Discharge Instructions (Signed)
1 - You may have urinary urgency (bladder spasms) and bloody urine on / off for up to 2 weeks and with catheter in place. This is normal.  2 - Call MD or go to ER for fever >102, severe pain / nausea / vomiting not relieved by medications, or acute change in medical status   Post Anesthesia Home Care Instructions  Activity: Get plenty of rest for the remainder of the day. A responsible individual must stay with you for 24 hours following the procedure.  For the next 24 hours, DO NOT: -Drive a car -Paediatric nurse -Drink alcoholic beverages -Take any medication unless instructed by your physician -Make any legal decisions or sign important papers.  Meals: Start with liquid foods such as gelatin or soup. Progress to regular foods as tolerated. Avoid greasy, spicy, heavy foods. If nausea and/or vomiting occur, drink only clear liquids until the nausea and/or vomiting subsides. Call your physician if vomiting continues.  Special Instructions/Symptoms: Your throat may feel dry or sore from the anesthesia or the breathing tube placed in your throat during surgery. If this causes discomfort, gargle with warm salt water. The discomfort should disappear within 24 hours.

## 2020-09-15 NOTE — Anesthesia Postprocedure Evaluation (Signed)
Anesthesia Post Note  Patient: Samuel [name]  Procedure(s) Performed: BLADDER BIOPSY FULGERATION (N/A ) CYSTOSCOPY WITH RETROGRADE PYELOGRAM (Bilateral )     Patient location during evaluation: PACU Anesthesia Type: General Level of consciousness: sedated Pain management: pain level controlled Vital Signs Assessment: post-procedure vital signs reviewed and stable Respiratory status: spontaneous breathing and respiratory function stable Cardiovascular status: stable Postop Assessment: no apparent nausea or vomiting Anesthetic complications: no   No complications documented.  Last Vitals:  Vitals:   09/15/20 0915 09/15/20 0930  BP: 114/71 119/72  Pulse: 70 67  Resp: 14 17  Temp:  36.4 C  SpO2: 99% 95%    Last Pain:  Vitals:   09/15/20 0930  TempSrc:   PainSc: 0-No pain                 Katelinn Justice DANIEL

## 2020-09-15 NOTE — Anesthesia Procedure Notes (Signed)
Procedure Name: LMA Insertion Date/Time: 09/15/2020 8:06 AM Performed by: Rogers Blocker, CRNA Pre-anesthesia Checklist: Patient identified, Emergency Drugs available, Suction available and Patient being monitored Patient Re-evaluated:Patient Re-evaluated prior to induction Oxygen Delivery Method: Circle System Utilized Preoxygenation: Pre-oxygenation with 100% oxygen Induction Type: IV induction Ventilation: Mask ventilation without difficulty LMA: LMA inserted LMA Size: 5.0 Number of attempts: 1 Placement Confirmation: positive ETCO2 Tube secured with: Tape Dental Injury: Teeth and Oropharynx as per pre-operative assessment

## 2020-09-16 NOTE — Op Note (Signed)
NAMEJOSEAN, LYCAN MEDICAL RECORD NO: [id number] ACCOUNT NO: [id number] DATE OF BIRTH: 1950/08/13 FACILITY: [facility] LOCATION: [facility] PHYSICIAN: Alexis Frock, MD  Operative Report   DATE OF PROCEDURE: 09/15/2020  PREOPERATIVE DIAGNOSIS:  Gross hematuria significant bladder erythema, worrisome for carcinoma in situ.  POSTOPERATIVE DIAGNOSES:  Gross hematuria, significant bladder erythema, worrisome for carcinoma in situ.  PROCEDURES PERFORMED:   1.  Cystoscopy, right retrograde pyelogram interpretation. 2.  Bladder biopsy, fulguration.  ESTIMATED BLOOD LOSS:  50 mL  COMPLICATIONS:  None.  SPECIMENS:  Bladder biopsy erythema for permanent pathology.  PREOPERATIVE FINDINGS:   1.  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 2.  Unremarkable retrograde pyelogram. 3.  Left retrograde pyelogram not performed as incredibly poor visualization; however, the left UO was visualized after fulguration and visually patent.   DRAINS:  One 24-French 3-way Foley catheter to straight drain irrigation port plugged.  INDICATIONS:  The patient is a 70 year old man who was found on workup of gross hematuria, found to have significant bladder erythema in a patchy distribution was highly concerning for possible carcinoma in situ.  Options were discussed for management  including recommended path of operative biopsy and fulguration, retrogrades.  She wished to proceed.  Otherwise, excellent imaging was unremarkable for worrisome etiologies.  Informed consent was obtained and placed in the medical record.  PROCEDURE IN DETAIL:  The patient being verified, procedure being cystoscopy, bilateral retrogrades, and bladder biopsy, fulguration was confirmed.  Procedure timeout was performed.  Intravenous antibiotics were administered.  General LMA anesthesia  induced.  The patient was placed into a low lithotomy position.  Sterile field was created,  prepped and draped the patient's the penis and perineum and proximal thighs using iodine.  Cystourethroscopy was performed using a 21-French rigid cystoscope with  offset lens.  Inspection of anterior and posterior urethra was unremarkable.  Inspection of the bladder revealed a very large amount of patchy erythema in all quadrants.  There were no obvious intraluminal papillary tumors.  Ureteral orifices were  singleton but incredibly difficult to visualize as the erythema was very friable and bled with any manipulation whatsoever.  Next, cold cup biopsy forceps was used to obtain relative biopsy of this.  The areas of erythema on the posterior wall were set  aside for permanent pathology, labeled as bladder erythema.  Next, using resectoscope loop and fulguration current was applied to all areas of erythema which resulted in much, much better hemostasis, but still overall bladder was very friable.  Bilateral  orifices were visualized during and after fulguration.  The right distal ureter was cannulated with a 6-French renal catheter and a right retrograde pyelogram was obtained.  Right retrograde pyelogram demonstrated a single right ureter, single system right kidney.  No filling defects were noted.  The left ureteral orifice was visualized only on continuous flow resectoscope, but very difficult to visualize a single flow  therefore left retrograde pyelogram was not obtained.  Once again, a cystoscope was used to fulgurate all areas of erythema, bleeding and a ureteral orifice running fashion.  Given the very large surface area of fulguration I felt a brief catheterization  would be warranted and a new 24-French 3-way Foley catheter was placed free for straight drain and irrigation port plugged.  The procedure was terminated.  The patient tolerated the procedure well, no immediate complications.  The  patient was taken to postanesthesia care unit in stable condition.  Plan for discharge home.  Elián.Darby D:  09/15/2020 8:49:46 am T: 09/16/2020 2:30:00 am  JOB: [id number]/ [id number]

## 2020-09-18 ENCOUNTER — Encounter (HOSPITAL_BASED_OUTPATIENT_CLINIC_OR_DEPARTMENT_OTHER): Payer: Self-pay | Admitting: Urology

## 2020-09-18 LAB — SURGICAL PATHOLOGY

## 2020-09-20 ENCOUNTER — Encounter: Payer: Self-pay | Admitting: Adult Health

## 2020-09-22 DIAGNOSIS — C678 Malignant neoplasm of overlapping sites of bladder: Secondary | ICD-10-CM | POA: Diagnosis not present

## 2020-09-24 ENCOUNTER — Other Ambulatory Visit: Payer: Self-pay | Admitting: Adult Health

## 2020-09-24 DIAGNOSIS — F32A Depression, unspecified: Secondary | ICD-10-CM

## 2020-09-25 ENCOUNTER — Telehealth: Payer: Self-pay | Admitting: Pharmacist

## 2020-09-25 NOTE — Chronic Care Management (AMB) (Signed)
Chronic Care Management Pharmacy Assistant   Name: Samuel [name]  MRN: [id number] DOB: 1950-06-06  Reason for Encounter: Chart Prep for CPP Visit on 09/28/2020   Conditions to be addressed/monitored: HTN, HLD, COPD and Depression  Primary concerns for visit include: Hypertension  Recent office visits:  . 01.27.2022 Dorothyann Peng, NP - Follow-up for COPD, depression and hypertension  Recent consult visits:  . 01.25.2022 Phone call: patient informed PCP Amlodipine was increased from 5 mg to 10 mg and Tamsulosin increased to capsules at night . 01.24.2022 Harrie Jeans C Nephrology follow-up for chronic kidney disease . 01.03.2022 Alexis Frock Urology follow-up see for gross hematuria . 12.10.2021 Harrie Jeans C Nephrology follow-up for chronic kidney disease Hospital visits:  Medication Reconciliation was completed by comparing discharge summary, patient's EMR and Pharmacy list, and upon discussion with patient.  Admitted to the hospital on 09/15/2020 due to Bladder Biopsy. Discharge date was 09/15/2020. Discharged from [facility].    New?Medications Started at [facility] Discharge:?? -started the following medications . Senna-docusate (Senokot-S) Take 1 tablet by mouth 2 (two) times daily. While taking strong pain meds to prevent constipation . Tramadol (Ultram) Take 1-2 tablets (50-100 mg total) by mouth every 6 (six)hours as needed for moderate pain or severe pain. Medication Changes at Hospital Discharge: . Cephalexin (KEFLEX) Take 1 capsule (500 mg total) by mouth 2 (two) times daily 3 days. Begin day before next Urology nurse practitioner visit. Medications Discontinued at Hospital Discharge: -None  Medications that remain the same after Hospital Discharge:??  -All other medications will remain the same.    Patient Questions:   1. Have you seen any other providers since your last visit? No 2. Any changes in your medications or health?  No 3. Any side effects from any medications? No 4. Any concerns about your health right now? No 5. Has your provider asked that you check blood pressure, blood sugar, or follow special diet at home?  a. He checks his blood pressure daily 6. Do you get any type of exercise on a regular basis? No 7. Can you think of a Walks goal you would like to reach for your health? No 8. Do you have any problems getting your medications? No 9. Is there anything that you would like to discuss during the appointment? No  The patient was asked to please bring medications, blood pressure/ blood sugar log, and supplements to his appointment.  Medication Dispensed  Quantity  Bupropion (WELLBUTRIN XL) 300 MG 24 hr Daily 02.08.2022 90  Fluticasone-Umeclidin-Vilant (TRELEGY ELLIPTA) 100-62.5-25 MCG/INH AEPB: Daily 12.24.2021 60  Amlodipine (NORVASC) 10 mg: daily 04.22.2022 90  Finasteride (PROSCAR) 5 MG: two capsules at bedtime 03.28.2022 90  Atorvastatin (LIPITOR) 40 mg: daily 02.22.2022 45  acetaminophen (TYLENOL) 500 MG OTC   Cholecalciferol (VITAMIN D3) 25 MCG (1000 UT) CAPS    metoprolol succinate (TOPROL-XL) 25 MG 24 hr tablet 04.26.2022 90  vitamin B-12 (CYANOCOBALAMIN) 1000 MCG tablet      Medications: Outpatient Encounter Medications as of 09/25/2020  Medication Sig  . acetaminophen (TYLENOL) 500 MG tablet Take 500 mg by mouth every 6 (six) hours as needed.  Marland Kitchen amLODipine (NORVASC) 10 MG tablet Take 10 mg by mouth daily.  Marland Kitchen atorvastatin (LIPITOR) 40 MG tablet Take 0.5 tablets (20 mg total) by mouth daily. Pt still has 20 mg, advised to take 2 tablets until finished then start 40 mg 1 tab daily  . buPROPion (WELLBUTRIN XL) 300 MG 24 hr tablet TAKE 1  TABLET(300 MG) BY MOUTH DAILY  . cephALEXin (KEFLEX) 500 MG capsule Take 1 capsule (500 mg total) by mouth 2 (two) times daily. X 3 days. Begin day before next Urology nurse practioner visit.  . Cholecalciferol (VITAMIN D3) 25 MCG (1000 UT) CAPS Take 1  capsule by mouth daily.  . finasteride (PROSCAR) 5 MG tablet Take 5 mg by mouth daily.  . Fluticasone-Umeclidin-Vilant (TRELEGY ELLIPTA) 100-62.5-25 MCG/INH AEPB Inhale 1 puff into the lungs daily.  . metoprolol succinate (TOPROL-XL) 25 MG 24 hr tablet Take 25 mg by mouth daily.  Marland Kitchen senna-docusate (SENOKOT-S) 8.6-50 MG tablet Take 1 tablet by mouth 2 (two) times daily. While taking strong pain meds to prevent constipation  . tamsulosin (FLOMAX) 0.4 MG CAPS capsule Take 2 capsules by mouth at bedtime.  . traMADol (ULTRAM) 50 MG tablet Take 1-2 tablets (50-100 mg total) by mouth every 6 (six) hours as needed for moderate pain or severe pain. Post-operatively  . vitamin B-12 (CYANOCOBALAMIN) 1000 MCG tablet Take 1,000 mcg by mouth daily.   No facility-administered encounter medications on file as of 09/25/2020.    Star Rating Drugs:  Dispensed Quantity Pharmacy  Atorvastatin 10 mg 02.22.2022 [address] [place]) [place], [place] [phone]

## 2020-09-27 ENCOUNTER — Ambulatory Visit (INDEPENDENT_AMBULATORY_CARE_PROVIDER_SITE_OTHER): Payer: Medicare Other | Admitting: Acute Care

## 2020-09-27 ENCOUNTER — Encounter: Payer: Self-pay | Admitting: Acute Care

## 2020-09-27 ENCOUNTER — Ambulatory Visit
Admission: RE | Admit: 2020-09-27 | Discharge: 2020-09-27 | Disposition: A | Discharge status: Home / Self Care | Payer: Medicare Other | Source: Ambulatory Visit | Attending: Acute Care | Admitting: Acute Care

## 2020-09-27 ENCOUNTER — Other Ambulatory Visit: Payer: Self-pay

## 2020-09-27 VITALS — BP 122/64 | HR 64 | Temp 98.0°F | Ht 64.0 in | Wt 148.8 lb

## 2020-09-27 DIAGNOSIS — S2241XA Multiple fractures of ribs, right side, initial encounter for closed fracture: Secondary | ICD-10-CM | POA: Diagnosis not present

## 2020-09-27 DIAGNOSIS — Z87891 Personal history of nicotine dependence: Secondary | ICD-10-CM

## 2020-09-27 DIAGNOSIS — J432 Centrilobular emphysema: Secondary | ICD-10-CM | POA: Diagnosis not present

## 2020-09-27 DIAGNOSIS — I2510 Atherosclerotic heart disease of native coronary artery without angina pectoris: Secondary | ICD-10-CM | POA: Diagnosis not present

## 2020-09-27 NOTE — Patient Instructions (Signed)
Thank you for participating in the [facility]. It was our pleasure to meet you today. We will call you with the results of your scan within the next few days. Your scan will be assigned a Lung RADS category score by the physicians reading the scans.  This Lung RADS score determines follow up scanning.  See below for description of categories, and follow up screening recommendations. We will be in touch to schedule your follow up screening annually or based on recommendations of our providers. We will fax a copy of your scan results to your Primary Care Physician, or the physician who referred you to the program, to ensure they have the results. Please call the office if you have any questions or concerns regarding your scanning experience or results.  Our office number is [phone]. Please speak with Denise Phelps, RN. She is our Lung Cancer Screening RN. If she is unavailable when you call, please have the office staff send her a message. She will return your call at her earliest convenience. Remember, if your scan is normal, we will scan you annually as long as you continue to meet the criteria for the program. (Age 55-77, Current smoker or smoker who has quit within the last 15 years). If you are a smoker, remember, quitting is the single most powerful action that you can take to decrease your risk of lung cancer and other pulmonary, breathing related problems. We know quitting is hard, and we are here to help.  Please let us know if there is anything we can do to help you meet your goal of quitting. If you are a former smoker, congratulations. We are proud of you! Remain smoke free! Remember you can refer friends or family members through the number above.  We will screen them to make sure they meet criteria for the program. Thank you for helping us take better care of you by participating in Lung Screening.  Lung RADS Categories:  Lung RADS 1: no nodules  or definitely non-concerning nodules.  Recommendation is for a repeat annual scan in 12 months.  Lung RADS 2:  nodules that are non-concerning in appearance and behavior with a very low likelihood of becoming an active cancer. Recommendation is for a repeat annual scan in 12 months.  Lung RADS 3: nodules that are probably non-concerning , includes nodules with a low likelihood of becoming an active cancer.  Recommendation is for a 6-month repeat screening scan. Often noted after an upper respiratory illness. We will be in touch to make sure you have no questions, and to schedule your 6-month scan.  Lung RADS 4 A: nodules with concerning findings, recommendation is most often for a follow up scan in 3 months or additional testing based on our provider's assessment of the scan. We will be in touch to make sure you have no questions and to schedule the recommended 3 month follow up scan.  Lung RADS 4 B:  indicates findings that are concerning. We will be in touch with you to schedule additional diagnostic testing based on our provider's  assessment of the scan.   

## 2020-09-27 NOTE — Progress Notes (Signed)
Shared Decision Making Visit Lung Cancer Screening Program [phone])   Eligibility:  Age 70 y.o.  Pack Years Smoking History Calculation 50 pack year smoking history (# packs/per year x # years smoked)  Recent History of coughing up blood  no  Unexplained weight loss? no ( >Than 15 pounds within the last 6 months )  Prior History Lung / other cancer no (Diagnosis within the last 5 years already requiring surveillance chest CT Scans).  Smoking Status Former Smoker  Former Smokers: Years since quit: 4 years  Quit Date: 2018  Visit Components:  Discussion included one or more decision making aids. yes  Discussion included risk/benefits of screening. yes  Discussion included potential follow up diagnostic testing for abnormal scans. yes  Discussion included meaning and risk of over diagnosis. yes  Discussion included meaning and risk of False Positives. yes  Discussion included meaning of total radiation exposure. yes  Counseling Included:  Importance of adherence to annual lung cancer LDCT screening. yes  Impact of comorbidities on ability to participate in the program. yes  Ability and willingness to under diagnostic treatment. yes  Smoking Cessation Counseling:  Current Smokers:   Discussed importance of smoking cessation. yes  Information about tobacco cessation classes and interventions provided to patient. yes  Patient provided with "ticket" for LDCT Scan. yes  Symptomatic Patient. no  Counseling  Diagnosis Code: Tobacco Use Z72.0  Asymptomatic Patient yes  Counseling (Intermediate counseling: > three minutes counseling) [id number]  Former Smokers:   Discussed the importance of maintaining cigarette abstinence. yes  Diagnosis Code: Personal History of Nicotine Dependence. O03.704  Information about tobacco cessation classes and interventions provided to patient. Yes  Patient provided with "ticket" for LDCT Scan. yes  Written Order for Lung Cancer  Screening with LDCT placed in Epic. Yes (CT Chest Lung Cancer Screening Low Dose W/O CM) [id number] Z12.2-Screening of respiratory organs Z87.891-Personal history of nicotine dependence  I spent 25 minutes of face to face time with Samuel [name] discussing the risks and benefits of lung cancer screening. We viewed a power point together that explained in detail the above noted topics. We took the time to pause the power point at intervals to allow for questions to be asked and answered to ensure understanding. We discussed that he had taken the single most powerful action possible to decrease his risk of developing lung cancer when he quit smoking. I counseled him to remain smoke free, and to contact me if he ever had the desire to smoke again so that I can provide resources and tools to help support the effort to remain smoke free. We discussed the time and location of the scan, and that either  Doroteo Glassman RN or I will call with the results within  24-48 hours of receiving them. He has my card and contact information in the event he needs to speak with me, in addition to a copy of the power point we reviewed as a resource. He verbalized understanding of all of the above and had no further questions upon leaving the office.     I explained to the patient that there has been a high incidence of coronary artery disease noted on these exams. I explained that this is a non-gated exam therefore degree or severity cannot be determined. This patient is currently on statin therapy. I have asked the patient to follow-up with their PCP regarding any incidental finding of coronary artery disease and management with diet or medication as they feel is clinically  indicated. The patient verbalized understanding of the above and had no further questions.     Samuel Spatz, NP 09/27/2020

## 2020-09-28 ENCOUNTER — Ambulatory Visit (INDEPENDENT_AMBULATORY_CARE_PROVIDER_SITE_OTHER): Payer: Medicare Other | Admitting: Pharmacist

## 2020-09-28 VITALS — BP 118/68

## 2020-09-28 DIAGNOSIS — J449 Chronic obstructive pulmonary disease, unspecified: Secondary | ICD-10-CM | POA: Diagnosis not present

## 2020-09-28 DIAGNOSIS — I10 Essential (primary) hypertension: Secondary | ICD-10-CM

## 2020-09-28 NOTE — Progress Notes (Signed)
Chronic Care Management Pharmacy Note  10/04/2020 Name:  Samuel [name] MRN:  [id number] DOB:  [birth year]  Subjective: Samuel [name] is an 70 y.o. year old male who is a primary patient of Dorothyann Peng, NP.  The CCM team was consulted for assistance with disease management and care coordination needs.    Engaged with patient by telephone for initial visit in response to provider referral for pharmacy case management and/or care coordination services.   Consent to Services:  The patient was given the following information about Chronic Care Management services today, agreed to services, and gave verbal consent: 1. CCM service includes personalized support from designated clinical staff supervised by the primary care provider, including individualized plan of care and coordination with other care providers 2. 24/7 contact phone numbers for assistance for urgent and routine care needs. 3. Service will only be billed when office clinical staff spend 20 minutes or more in a month to coordinate care. 4. Only one practitioner may furnish and bill the service in a calendar month. 5.The patient may stop CCM services at any time (effective at the end of the month) by phone call to the office staff. 6. The patient will be responsible for cost sharing (co-pay) of up to 20% of the service fee (after annual deductible is met). Patient agreed to services and consent obtained.  Patient Care Team: Dorothyann Peng, NP as PCP - General (Family Medicine) Kidney, [place] (Nephrology) Viona Gilmore, [facility] as Pharmacist (Pharmacist)  Recent office visits:  01.27.2022 Dorothyann Peng, NP: presented for follow-up for COPD, depression and hypertension.   08/16/20 Randel Pigg, LPN: Patient presented for AWV.  Recent consult visits:   01.24.2022 Claudia Desanctis Nephrology: presented for follow-up for chronic kidney disease.  Amlodipine was increased from 5 mg to 10 mg and Tamsulosin  increased to 2 capsules at night.   01.03.2022 Alexis Frock Urology: presented for follow-up for gross hematuria.   12.10.2021 Harrie Jeans C Nephrology: presented for follow-up for chronic kidney disease.   Hospital visits: Admitted to the hospital on 09/15/2020 due to Bladder Biopsy. Discharge date was 09/15/2020. Discharged from [facility].    New?Medications Started at [facility] Discharge:?? -started the following medications  Senna-docusate (Senokot-S) Take 1 tablet by mouth 2 (two) times daily. While taking strong pain meds to prevent constipation  Tramadol (Ultram) Take 1-2 tablets (50-100 mg total) by mouth every 6 (six)hours as needed for moderate pain or severe pain. Medication Changes at Hospital Discharge:  Cephalexin [facility]) Take 1 capsule (500 mg total) by mouth 2 (two) times daily 3 days. Begin day before next Urology nurse practitioner visit.   Objective:  Lab Results  Component Value Date   CREATININE 2.10 (H) 09/15/2020   BUN 42 (H) 09/15/2020   GFR 38.83 (L) 02/19/2019   GFRNONAA 30 (L) 04/04/2020   GFRAA 35 (L) 04/04/2020   NA 141 09/15/2020   K 4.4 09/15/2020   CALCIUM 10.3 04/04/2020   CO2 27 04/04/2020   GLUCOSE 120 (H) 09/15/2020    Lab Results  Component Value Date/Time   HGBA1C 5.9 (H) 04/04/2020 07:48 AM   HGBA1C 6.2 12/19/2017 07:49 AM   GFR 38.83 (L) 02/19/2019 07:21 AM   GFR 68.60 12/19/2017 07:49 AM    Last diabetic Eye exam: No results found for: HMDIABEYEEXA  Last diabetic Foot exam: No results found for: HMDIABFOOTEX   Lab Results  Component Value Date   CHOL 208 (H) 04/04/2020   HDL 58 04/04/2020  LDLCALC 129 (H) 04/04/2020   TRIG 99 04/04/2020   CHOLHDL 3.6 04/04/2020    Hepatic Function Latest Ref Rng & Units 04/04/2020 02/19/2019 12/19/2017  Total Protein 6.1 - 8.1 g/dL 7.4 7.4 7.0  Albumin 3.5 - 5.2 g/dL - 4.5 4.2  AST 10 - 35 U/L _0 ALT 9 - 46 U/L _1 Alk Phosphatase 39 - 117 U/L -  138(H) 119(H)  Total Bilirubin 0.2 - 1.2 mg/dL 0.5 0.6 0.5  Bilirubin, Direct 0.0 - 0.3 mg/dL - - 0.1    Lab Results  Component Value Date/Time   TSH 1.89 04/04/2020 07:48 AM   TSH 1.93 02/19/2019 07:21 AM    CBC Latest Ref Rng & Units 09/15/2020 04/04/2020 02/19/2019  WBC 3.8 - 10.8 Thousand/uL - 10.1 10.1  Hemoglobin 13.0 - 17.0 g/dL 13.9 14.4 13.3  Hematocrit 39.0 - 52.0 % 41.0 43.6 40.0  Platelets 140 - 400 Thousand/uL - 437(H) 442.0(H)    Lab Results  Component Value Date/Time   VD25OH 31.48 05/07/2017 01:43 PM    Clinical ASCVD: No  The 10-year ASCVD risk score Mikey Bussing DC Jr., et al., 2013) is: 17.5%   Values used to calculate the score:     Age: 41 years     Sex: Male     Is Non-Hispanic African American: No     Diabetic: No     Tobacco smoker: No     Systolic Blood Pressure: 371 mmHg     Is BP treated: Yes     HDL Cholesterol: 58 mg/dL     Total Cholesterol: 208 mg/dL    Depression screen [facility] 2/9 08/16/2020 03/23/2020 12/19/2017  Decreased Interest 0 0 0  Down, Depressed, Hopeless 0 0 0  PHQ - 2 Score 0 0 0     No flowsheet data found.   No flowsheet data found.    Social History   Tobacco Use  Smoking Status Former Smoker  . Years: 58.00  . Types: Cigarettes  . Quit date: 01/16/2017  . Years since quitting: 3.7  Smokeless Tobacco Never Used   BP Readings from Last 3 Encounters:  09/28/20 118/68  09/27/20 122/64  09/15/20 122/77   Pulse Readings from Last 3 Encounters:  09/27/20 64  09/15/20 79  08/16/20 74   Wt Readings from Last 3 Encounters:  09/27/20 148 lb 12.8 oz (67.5 kg)  09/15/20 148 lb 12.8 oz (67.5 kg)  08/16/20 153 lb (69.4 kg)   BMI Readings from Last 3 Encounters:  09/27/20 25.54 kg/m  09/15/20 25.54 kg/m  08/16/20 26.26 kg/m    Assessment/Interventions: Review of patient past medical history, allergies, medications, health status, including review of consultants reports, laboratory and other test data, was performed as  part of comprehensive evaluation and provision of chronic care management services.   SDOH:  (Social Determinants of Health) assessments and interventions performed: Yes SDOH Interventions   Flowsheet Row Most Recent Value  SDOH Interventions   Financial Strain Interventions Other (Comment)  [working on [employer] patient assistance]  Transportation Interventions Intervention Not Indicated     SDOH Screenings   Alcohol Screen: Low Risk   . Last Alcohol Screening Score (AUDIT): 0  Depression (PHQ2-9): Low Risk   . PHQ-2 Score: 0  Financial Resource Strain: Medium Risk  . Difficulty of Paying Living Expenses: Somewhat hard  Food Insecurity: No Food Insecurity  . Worried About Charity fundraiser in the Last Year: Never true  . Ran Out of  Food in the Last Year: Never true  Housing: Low Risk   . Last Housing Risk Score: 0  Physical Activity: Inactive  . Days of Exercise per Week: 0 days  . Minutes of Exercise per Session: 0 min  Social Connections: Moderately Isolated  . Frequency of Communication with Friends and Family: More than three times a week  . Frequency of Social Gatherings with Friends and Family: More than three times a week  . Attends Religious Services: Never  . Active Member of Clubs or Organizations: No  . Attends Archivist Meetings: Never  . Marital Status: Married  Stress: No Stress Concern Present  . Feeling of Stress : Not at all  Tobacco Use: Medium Risk  . Smoking Tobacco Use: Former Smoker  . Smokeless Tobacco Use: Never Used  Transportation Needs: No Transportation Needs  . Lack of Transportation (Medical): No  . Lack of Transportation (Non-Medical): No   Patient lives with his wife and he doesn't do much throughout the day. Wife describes him as a couch potato. He is not interested in exercise and doesn't enjoy walking.  Patient is supposed to be follwing low potassium diet but doesn't think he does. He did cut down on milk because of his  kidney function. He usually eats hamburgers and hot dogs, chicken, spaghetti and not many fruits and vegetables.He drinks regular soda, sweet tea, and powerade and not much water. He does have a sweet tooth and frequently eats sweets.  Patient does not sleep well which has been ongoing for a long time. He gets up several times during the night to go to the bathroom and has been getting up more frequently since surgery. He also takes naps during the day.  Patient denies problems with his medications but does feel as though anxiety is getting worse and wasn't sure if he needed to continue with the Wellbutrin given he stopped smoking 4 years ago.  CCM Care Plan  Allergies  Allergen Reactions  . Penicillins Nausea And Vomiting    Medications Reviewed Today    Reviewed by Viona Gilmore, [facility] (Pharmacist) on 10/04/20 at 1146  Med List Status: <None>  Medication Order Taking? Sig Documenting Provider Last Dose Status Informant  acetaminophen (TYLENOL) 500 MG tablet [id number] Yes Take 500 mg by mouth every 6 (six) hours as needed. [provider] Taking Active   amLODipine (NORVASC) 10 MG tablet [id number] Yes Take 10 mg by mouth daily. [provider] Taking Active Self  atorvastatin (LIPITOR) 40 MG tablet [id number] Yes Take 0.5 tablets (20 mg total) by mouth daily. Pt still has 20 mg, advised to take 2 tablets until finished then start 40 mg 1 tab daily [name], [name], NP Taking Active Self  Budeson-Glycopyrrol-Formoterol (BREZTRI AEROSPHERE) 160-9-4.8 MCG/ACT AERO [id number]  Use as directed.  GXQ#[id number] Exp 8/23  4 boxes [name], [name], NP  Active   buPROPion (WELLBUTRIN XL) 300 MG 24 hr tablet [id number] Yes TAKE 1 TABLET(300 MG) BY MOUTH DAILY [name], [name], NP Taking Active   Cholecalciferol (VITAMIN D3) 25 MCG (1000 UT) CAPS [id number] Yes Take 1 capsule by mouth daily. [provider] Taking Active Self  finasteride (PROSCAR) 5 MG tablet [id number] Yes Take 5 mg  by mouth daily. Alexis Frock, MD Taking Active Self  Fluticasone-Umeclidin-Vilant (TRELEGY ELLIPTA) 100-62.5-25 MCG/INH AEPB [id number] Yes Inhale 1 puff into the lungs daily. [name], [name], NP Taking Active   metoprolol succinate (TOPROL-XL) 25 MG 24 hr tablet [id number] Yes Take 25 mg by mouth  daily. [provider] Taking Active Self  tamsulosin (FLOMAX) 0.4 MG CAPS capsule [id number] Yes Take 2 capsules by mouth at bedtime. [provider] Taking Active Self  vitamin B-12 (CYANOCOBALAMIN) 500 MCG tablet [id number] Yes Take 1,000 mcg by mouth daily. [provider] Taking Active           Patient Active Problem List   Diagnosis Date Noted  . Hyperlipidemia 12/19/2017  . Depression   . Essential hypertension 11/15/2016  . Tobacco use 11/15/2016  . BPH without urinary obstruction 11/15/2016    Immunization History  Administered Date(s) Administered  . Influenza, High Dose Seasonal PF 02/27/2017  . Pneumococcal Conjugate-13 11/15/2016  . Pneumococcal Polysaccharide-23 12/19/2017    Conditions to be addressed/monitored:  Hypertension, Hyperlipidemia, COPD, Depression and BPH  Care Plan : CCM Pharmacy Care Plan  Updates made by Viona Gilmore, [place] since 10/04/2020 12:00 AM    Problem: Problem: Hypertension, Hyperlipidemia, COPD, Depression, and BPH     Long-Range Goal: Patient-Specific Goal   Start Date: 09/28/2020  Expected End Date: 09/28/2021  This Visit's Progress: On track  Priority: High  Note:   Current Barriers:  . Unable to independently afford treatment regimen . Unable to independently monitor therapeutic efficacy  Pharmacist Clinical Goal(s):  Marland Kitchen Patient will verbalize ability to afford treatment regimen . achieve adherence to monitoring guidelines and medication adherence to achieve therapeutic efficacy through collaboration with PharmD and provider.   Interventions: . 1:1 collaboration with Dorothyann Peng, NP regarding development  and update of comprehensive plan of care as evidenced by provider attestation and co-signature . Inter-disciplinary care team collaboration (see longitudinal plan of care) . Comprehensive medication review performed; medication list updated in electronic medical record  Hypertension (BP goal <140/90) -Controlled -Current treatment: . Amlodipine 10 mg 1 tablet daily - in AM . Metoprolol succinate 25 mg 1 tablet daily - in AM -Medications previously tried: none  -Current home readings: 123/78 (this morning), 145-150s  -Current dietary habits: uses salt to season -Current exercise habits: does not have any interest -Denies hypotensive/hypertensive symptoms -Educated on Daily salt intake goal < 2300 mg; Exercise goal of 150 minutes per week; Importance of home blood pressure monitoring; Proper BP monitoring technique; -Counseled to monitor BP at home weekly, document, and provide log at future appointments -Counseled on diet and exercise extensively Recommended to continue current medication Recommended moving amlodipine to evening for better 24 hour coverage and ASCVD benefits  Hyperlipidemia: (LDL goal < 100) -Uncontrolled -Current treatment: . Atorvastatin 40 mg 1 tablet daily -Medications previously tried: none  -Current dietary patterns: does not eat many fruits and vegetables -Current exercise habits: not interested -Educated on Cholesterol goals;  Benefits of statin for ASCVD risk reduction; Importance of limiting foods high in cholesterol; Exercise goal of 150 minutes per week; -Counseled on diet and exercise extensively Recommended to continue current medication Recommended repeat lipid panel  COPD (Goal: control symptoms and prevent exacerbations) -Not ideally controlled -Current treatment  . Trelegy 200-6.25-25 mcg/act inhaler 1 puff daily -Medications previously tried: Librarian, academic (cost)  -Gold Grade: n/a -Current COPD Classification:  B (high sx, <2  exacerbations/yr) -MMRC/CAT score: n/a -Pulmonary function testing: none -Exacerbations requiring treatment in last 6 months: none -Patient reports consistent use of maintenance inhaler -Frequency of rescue inhaler use: n/a -Counseled on Proper inhaler technique; Benefits of consistent maintenance inhaler use When to use rescue inhaler -Assessed patient finances. Filled out patient assistance paperwork for [employer].  Depression/Anxiety (Goal: minimize symptoms) -Uncontrolled -Current treatment: .  Bupropion XL 300 mg 1 tablet daily -Medications previously tried/failed: none -PHQ9: 0 -GAD7: 10 -Educated on Benefits of medication for symptom control -Recommended to continue current medication Recommended on following up with PCP to discuss further. Discussed recommendations with PCP.  BPH (Goal: minimize symptoms) -Uncontrolled -Current treatment  . tamsulosin 0.4 mg 2 capsules daily . Finasteride 5 mg 1 tablet daily -Medications previously tried: none  -Recommended to follow up with Dr. Tresa Moore for continued symptoms  Health Maintenance -Vaccine gaps: shingles, COVID, shingles -Current therapy:  Marland Kitchen Vitamin B12 500 mcg 1 tablet daily . Vitamin D 1000 units 1 capsule daily . Tylenol as needed -Educated on Cost vs benefit of each product must be carefully weighed by individual consumer -Patient is satisfied with current therapy and denies issues -Recommended to continue current medication  Patient Goals/Self-Care Activities . Patient will:  - take medications as prescribed check blood pressure weekly, document, and provide at future appointments target a minimum of 150 minutes of moderate intensity exercise weekly engage in dietary modifications by limiting salt intake  Follow Up Plan: Telephone follow up appointment with care management team member scheduled for: Face to Face appointment with care management team member scheduled for:  6 months       Medication  Assistance: Application for [facility]  medication assistance program. in process.  Anticipated assistance start date 11/04/20.  See plan of care for additional detail.  Patient's preferred pharmacy is:  [facility] [place], [place] - [address] AT [address] [place] [address] Phone: [phone] Fax: [phone]  Uses pill box? Yes - weekly (wife supplements) Pt endorses 99% compliance   We discussed: Current pharmacy is preferred with insurance plan and patient is satisfied with pharmacy services Patient decided to: Continue current medication management strategy  Care Plan and Follow Up Patient Decision:  Patient agrees to Care Plan and Follow-up.  Plan: Face to Face appointment with care management team member scheduled for: 6 months  [name], PharmD [place] Pharmacist [place] [facility] [place] [phone]

## 2020-09-29 NOTE — Progress Notes (Signed)
Please call patient and let them  know their  low dose Ct was read as a Lung RADS 2: nodules that are benign in appearance and behavior with a very low likelihood of becoming a clinically active cancer due to size or lack of growth. Recommendation per radiology is for a repeat LDCT in 12 months. .Please let them  know we will order and schedule their  annual screening scan for 09/2021. Please let them  know there was notation of CAD on their  scan.  Please remind the patient  that this is a non-gated exam therefore degree or severity of disease  cannot be determined. Please have them  follow up with their PCP regarding potential risk factor modification, dietary therapy or pharmacologic therapy if clinically indicated. Pt.  is  currently on statin therapy. Please place order for annual  screening scan for  09/2021 and fax results to PCP. Thanks so much. 

## 2020-10-02 DIAGNOSIS — N281 Cyst of kidney, acquired: Secondary | ICD-10-CM | POA: Diagnosis not present

## 2020-10-02 DIAGNOSIS — C678 Malignant neoplasm of overlapping sites of bladder: Secondary | ICD-10-CM | POA: Diagnosis not present

## 2020-10-02 DIAGNOSIS — R3915 Urgency of urination: Secondary | ICD-10-CM | POA: Diagnosis not present

## 2020-10-03 ENCOUNTER — Other Ambulatory Visit: Payer: Self-pay

## 2020-10-03 ENCOUNTER — Other Ambulatory Visit: Payer: Self-pay | Admitting: *Deleted

## 2020-10-03 MED ORDER — BREZTRI AEROSPHERE 160-9-4.8 MCG/ACT IN AERO: INHALATION_SPRAY | RESPIRATORY_TRACT | 0 refills | Status: DC

## 2020-10-04 ENCOUNTER — Ambulatory Visit (INDEPENDENT_AMBULATORY_CARE_PROVIDER_SITE_OTHER): Payer: Medicare Other | Admitting: Adult Health

## 2020-10-04 ENCOUNTER — Encounter: Payer: Self-pay | Admitting: Adult Health

## 2020-10-04 VITALS — BP 100/68 | HR 89 | Temp 98.0°F | Ht 64.0 in | Wt 149.0 lb

## 2020-10-04 DIAGNOSIS — F419 Anxiety disorder, unspecified: Secondary | ICD-10-CM

## 2020-10-04 MED ORDER — CITALOPRAM HYDROBROMIDE 10 MG PO TABS: 10.0000 mg | ORAL_TABLET | Freq: Every day | ORAL | 0 refills | Status: DC

## 2020-10-04 NOTE — Patient Instructions (Addendum)
Start the celexa tonight at bedtime   Skip Wellbutrin tomorrow. Then take every other day for 3 doses. If you withdrawal symptoms then take an a dose of wellbutrin. If you do not have any symptoms then stop the wellbutrin completely.   Follow up in 1 month or sooner if needed

## 2020-10-04 NOTE — Progress Notes (Signed)
Subjective:    Patient ID: Samuel [name], male    DOB: [birth year], 70 y.o.   MRN: [id number]  HPI 70 year old male who  has a past medical history of Anxiety, Bladder cancer ([place]), BPH with obstruction/lower urinary tract symptoms, CKD (chronic kidney disease), stage III ([place]), Congenital deformity of left hand, COPD (chronic obstructive pulmonary disease) ([place]), Depression, DOE (dyspnea on exertion), Essential hypertension, Feeling of incomplete bladder emptying, Hematuria, History of adenomatous polyp of colon, Hyperlipidemia, Renal cysts, acquired, bilateral, and Wears glasses.  He presents to the office today with his wife with worsening anxiety.  Currently prescribed Wellbutrin 300 mg, was originally placed on this to help him quit smoking which she was successful in doing.  He does not feel as though the Wellbutrin is helping with his anxiety and he does not feel depressed.  Recently was diagnosed with bladder cancer and had surgical removal of the mass.  He will start 6 weeks of BCG treatment.  He and his wife feel as though her anxiety has increased since the diagnosis, but thankfully not experiencing panic attacks.  Wife reports that the patient seems more angry and has "short fuse".  Is interested in coming off Wellbutrin and starting something else to help with his symptoms  GAD 7 : Generalized Anxiety Score 09/28/2020  Nervous, Anxious, on Edge 3  Control/stop worrying 2  Worry too much - different things 1  Trouble relaxing 0  Restless 1  Easily annoyed or irritable 3  Afraid - awful might happen 0  Total GAD 7 Score 10  Anxiety Difficulty Somewhat difficult    Review of Systems See HPI   Past Medical History:  Diagnosis Date  . Anxiety   . Bladder cancer [facility])    urologist--- dr Tresa Moore  . BPH with obstruction/lower urinary tract symptoms   . CKD (chronic kidney disease), stage III ([place])   . Congenital deformity of left hand   . COPD (chronic obstructive  pulmonary disease) ([place])    followed by pcp  . Depression   . DOE (dyspnea on exertion)   . Essential hypertension    followed by pcp  . Feeling of incomplete bladder emptying   . Hematuria   . History of adenomatous polyp of colon   . Hyperlipidemia   . Renal cysts, acquired, bilateral   . Wears glasses     Social History   Socioeconomic History  . Marital status: Married    Spouse name: Not on file  . Number of children: Not on file  . Years of education: Not on file  . Highest education level: Not on file  Occupational History  . Not on file  Tobacco Use  . Smoking status: Former Smoker    Years: 58.00    Types: Cigarettes    Quit date: 01/16/2017    Years since quitting: 3.7  . Smokeless tobacco: Never Used  Vaping Use  . Vaping Use: Never used  Substance and Sexual Activity  . Alcohol use: No  . Drug use: Never  . Sexual activity: Not on file  Other Topics Concern  . Not on file  Social History Narrative   Retired    Married       He watches television   Social Determinants of Health   Financial Resource Strain: Medium Risk  . Difficulty of Paying Living Expenses: Somewhat hard  Food Insecurity: No Food Insecurity  . Worried About Charity fundraiser in the Last Year: Never  true  . Ran Out of Food in the Last Year: Never true  Transportation Needs: No Transportation Needs  . Lack of Transportation (Medical): No  . Lack of Transportation (Non-Medical): No  Physical Activity: Inactive  . Days of Exercise per Week: 0 days  . Minutes of Exercise per Session: 0 min  Stress: No Stress Concern Present  . Feeling of Stress : Not at all  Social Connections: Moderately Isolated  . Frequency of Communication with Friends and Family: More than three times a week  . Frequency of Social Gatherings with Friends and Family: More than three times a week  . Attends Religious Services: Never  . Active Member of Clubs or Organizations: No  . Attends Theatre manager Meetings: Never  . Marital Status: Married  Human resources officer Violence: Not At Risk  . Fear of Current or Ex-Partner: No  . Emotionally Abused: No  . Physically Abused: No  . Sexually Abused: No    Past Surgical History:  Procedure Laterality Date  . COLONOSCOPY WITH PROPOFOL  last one 02-04-2017  dr Henrene Pastor  . CYSTOSCOPY W/ RETROGRADES Bilateral 09/15/2020   Procedure: CYSTOSCOPY WITH RETROGRADE PYELOGRAM;  Surgeon: Alexis Frock, MD;  Location: [facility];  Service: Urology;  Laterality: Bilateral;  . NO PAST SURGERIES    . TRANSURETHRAL RESECTION OF BLADDER TUMOR N/A 09/15/2020   Procedure: BLADDER BIOPSY FULGERATION;  Surgeon: Alexis Frock, MD;  Location: [facility];  Service: Urology;  Laterality: N/A;  69 MINS    Family History  Problem Relation Age of Onset  . Hypertension Mother   . Stroke Mother   . Heart disease Father   . Breast cancer Sister   . Prostate cancer Brother   . Hypertension Sister   . Colon cancer Neg Hx   . Esophageal cancer Neg Hx   . Pancreatic cancer Neg Hx   . Rectal cancer Neg Hx   . Stomach cancer Neg Hx     Allergies  Allergen Reactions  . Penicillins Nausea And Vomiting    Current Outpatient Medications on File Prior to Visit  Medication Sig Dispense Refill  . acetaminophen (TYLENOL) 500 MG tablet Take 500 mg by mouth every 6 (six) hours as needed.    Marland Kitchen amLODipine (NORVASC) 10 MG tablet Take 10 mg by mouth daily.    Marland Kitchen atorvastatin (LIPITOR) 40 MG tablet Take 0.5 tablets (20 mg total) by mouth daily. Pt still has 20 mg, advised to take 2 tablets until finished then start 40 mg 1 tab daily (Patient taking differently: Take 20 mg by mouth daily. 40 mg 1 tab daily) 90 tablet 3  . Cholecalciferol (VITAMIN D3) 25 MCG (1000 UT) CAPS Take 1 capsule by mouth daily.    . finasteride (PROSCAR) 5 MG tablet Take 5 mg by mouth daily.    . metoprolol succinate (TOPROL-XL) 25 MG 24 hr tablet Take 25 mg by mouth  daily.    . tamsulosin (FLOMAX) 0.4 MG CAPS capsule Take 2 capsules by mouth at bedtime.    . vitamin B-12 (CYANOCOBALAMIN) 500 MCG tablet Take 1,000 mcg by mouth daily.    . Budeson-Glycopyrrol-Formoterol (BREZTRI AEROSPHERE) 160-9-4.8 MCG/ACT AERO Use as directed.  IRW#[id number] Exp 8/23  4 boxes (Patient not taking: Reported on 10/04/2020) 5.9 g 0   No current facility-administered medications on file prior to visit.    BP 100/68   Pulse 89   Temp 98 F (36.7 C) (Oral)   Ht 5\' 4"  (  1.626 m)   Wt 149 lb (67.6 kg)   SpO2 94%   BMI 25.58 kg/m       Objective:   Physical Exam Vitals and nursing note reviewed.  Constitutional:      Appearance: Normal appearance.  Skin:    General: Skin is warm and dry.  Neurological:     General: No focal deficit present.     Mental Status: He is alert and oriented to person, place, and time.  Psychiatric:        Mood and Affect: Mood normal.        Behavior: Behavior normal.        Thought Content: Thought content normal.        Judgment: Judgment normal.       Assessment & Plan:  1. Anxiety -Weaning instructions given for Wellbutrin.  We will start him on Celexa 10 mg daily, can take at bedtime.  Side effects reviewed.  We will have him follow-up in 1 month or sooner if needed - citalopram (CELEXA) 10 MG tablet; Take 1 tablet (10 mg total) by mouth daily.  Dispense: 90 tablet; Refill: 0   Dorothyann Peng, NP

## 2020-10-04 NOTE — Patient Instructions (Addendum)
Hi Samuel [name],   It was great to get to meet you in person! Below is a summary of some of the topics we discussed. Keep working on making some of those changes to your diet and exercise as we discussed to keep your heart and kidneys as healthy as possible.  Please reach out to me if you have any questions or need anything before our follow up!  Best, Maddie  [name], PharmD, [facility] [phone]  Visit Information  Goals Addressed            This Visit's Progress   . Track and Manage My Blood Pressure-Hypertension       Timeframe:  Short-Term Goal Priority:  Medium Start Date:                             Expected End Date:                       Follow Up Date 04/06/2021    - check blood pressure weekly - choose a place to take my blood pressure (home, clinic or office, retail store) - write blood pressure results in a log or diary    Why is this important?    You won't feel high blood pressure, but it can still hurt your blood vessels.   High blood pressure can cause heart or kidney problems. It can also cause a stroke.   Making lifestyle changes like losing a little weight or eating less salt will help.   Checking your blood pressure at home and at different times of the day can help to control blood pressure.   If the doctor prescribes medicine remember to take it the way the doctor ordered.   Call the office if you cannot afford the medicine or if there are questions about it.     Notes:       Patient Care Plan: CCM Pharmacy Care Plan    Problem Identified: Problem: Hypertension, Hyperlipidemia, COPD, Depression, and BPH     Long-Range Goal: Patient-Specific Goal   Start Date: 09/28/2020  Expected End Date: 09/28/2021  This Visit's Progress: On track  Priority: High  Note:   Current Barriers:  . Unable to independently afford treatment regimen . Unable to independently monitor therapeutic  efficacy  Pharmacist Clinical Goal(s):  Marland Kitchen Patient will verbalize ability to afford treatment regimen . achieve adherence to monitoring guidelines and medication adherence to achieve therapeutic efficacy through collaboration with PharmD and provider.   Interventions: . 1:1 collaboration with Dorothyann Peng, NP regarding development and update of comprehensive plan of care as evidenced by provider attestation and co-signature . Inter-disciplinary care team collaboration (see longitudinal plan of care) . Comprehensive medication review performed; medication list updated in electronic medical record  Hypertension (BP goal <140/90) -Controlled -Current treatment: . Amlodipine 10 mg 1 tablet daily - in AM . Metoprolol succinate 25 mg 1 tablet daily - in AM -Medications previously tried: none  -Current home readings: 123/78 (this morning), 145-150s  -Current dietary habits: uses salt to season -Current exercise habits: does not have any interest -Denies hypotensive/hypertensive symptoms -Educated on Daily salt intake goal < 2300 mg; Exercise goal of 150 minutes per week; Importance of home blood pressure monitoring; Proper BP monitoring technique; -Counseled to monitor BP at home weekly, document, and provide log at future appointments -Counseled on diet and exercise extensively Recommended to continue current  medication Recommended moving amlodipine to evening for better 24 hour coverage and ASCVD benefits  Hyperlipidemia: (LDL goal < 100) -Uncontrolled -Current treatment: . Atorvastatin 40 mg 1 tablet daily -Medications previously tried: none  -Current dietary patterns: does not eat many fruits and vegetables -Current exercise habits: not interested -Educated on Cholesterol goals;  Benefits of statin for ASCVD risk reduction; Importance of limiting foods high in cholesterol; Exercise goal of 150 minutes per week; -Counseled on diet and exercise extensively Recommended to  continue current medication Recommended repeat lipid panel  COPD (Goal: control symptoms and prevent exacerbations) -Not ideally controlled -Current treatment  . Trelegy 200-6.25-25 mcg/act inhaler 1 puff daily -Medications previously tried: Librarian, academic (cost)  -Gold Grade: n/a -Current COPD Classification:  B (high sx, <2 exacerbations/yr) -MMRC/CAT score: n/a -Pulmonary function testing: none -Exacerbations requiring treatment in last 6 months: none -Patient reports consistent use of maintenance inhaler -Frequency of rescue inhaler use: n/a -Counseled on Proper inhaler technique; Benefits of consistent maintenance inhaler use When to use rescue inhaler -Assessed patient finances. Filled out patient assistance paperwork for [employer].  Depression/Anxiety (Goal: minimize symptoms) -Uncontrolled -Current treatment: . Bupropion XL 300 mg 1 tablet daily -Medications previously tried/failed: none -PHQ9: 0 -GAD7: 10 -Educated on Benefits of medication for symptom control -Recommended to continue current medication Recommended on following up with PCP to discuss further. Discussed recommendations with PCP.  BPH (Goal: minimize symptoms) -Uncontrolled -Current treatment  . tamsulosin 0.4 mg 2 capsules daily . Finasteride 5 mg 1 tablet daily -Medications previously tried: none  -Recommended to follow up with Dr. Tresa Moore for continued symptoms  Health Maintenance -Vaccine gaps: shingles, COVID, shingles -Current therapy:  Marland Kitchen Vitamin B12 500 mcg 1 tablet daily . Vitamin D 1000 units 1 capsule daily . Tylenol as needed -Educated on Cost vs benefit of each product must be carefully weighed by individual consumer -Patient is satisfied with current therapy and denies issues -Recommended to continue current medication  Patient Goals/Self-Care Activities . Patient will:  - take medications as prescribed check blood pressure weekly, document, and provide at future appointments target a  minimum of 150 minutes of moderate intensity exercise weekly engage in dietary modifications by limiting salt intake  Follow Up Plan: Telephone follow up appointment with care management team member scheduled for: Face to Face appointment with care management team member scheduled for:  6 months      Samuel [name] was given information about Chronic Care Management services today including:  1. CCM service includes personalized support from designated clinical staff supervised by his physician, including individualized plan of care and coordination with other care providers 2. 24/7 contact phone numbers for assistance for urgent and routine care needs. 3. Standard insurance, coinsurance, copays and deductibles apply for chronic care management only during months in which we provide at least 20 minutes of these services. Most insurances cover these services at 100%, however patients may be responsible for any copay, coinsurance and/or deductible if applicable. This service may help you avoid the need for more expensive face-to-face services. 4. Only one practitioner may furnish and bill the service in a calendar month. 5. The patient may stop CCM services at any time (effective at the end of the month) by phone call to the office staff.  Patient agreed to services and verbal consent obtained.   The patient verbalized understanding of instructions, educational materials, and care plan provided today and agreed to receive a mailed copy of patient instructions, educational materials, and care plan.  The  pharmacy team will reach out to the patient again over the next 90 days.   Viona Gilmore, [facility]  [email or web address]">  DASH Eating Plan DASH stands for Dietary Approaches to Stop Hypertension. The DASH eating plan is a healthy eating plan that has been shown to:  Reduce high blood pressure (hypertension).  Reduce your risk for type 2 diabetes,  heart disease, and stroke.  Help with weight loss. What are tips for following this plan? Reading food labels  Check food labels for the amount of salt (sodium) per serving. Choose foods with less than 5 percent of the Daily Value of sodium. Generally, foods with less than 300 milligrams (mg) of sodium per serving fit into this eating plan.  To find whole grains, look for the word "whole" as the first word in the ingredient list. Shopping  Buy products labeled as "low-sodium" or "no salt added."  Buy fresh foods. Avoid canned foods and pre-made or frozen meals. Cooking  Avoid adding salt when cooking. Use salt-free seasonings or herbs instead of table salt or sea salt. Check with your health care provider or pharmacist before using salt substitutes.  Do not fry foods. Cook foods using healthy methods such as baking, boiling, grilling, roasting, and broiling instead.  Cook with heart-healthy oils, such as olive, canola, avocado, soybean, or sunflower oil. Meal planning  Eat a balanced diet that includes: ? 4 or more servings of fruits and 4 or more servings of vegetables each day. Try to fill one-half of your plate with fruits and vegetables. ? 6-8 servings of whole grains each day. ? Less than 6 oz (170 g) of lean meat, poultry, or fish each day. A 3-oz (85-g) serving of meat is about the same size as a deck of cards. One egg equals 1 oz (28 g). ? 2-3 servings of low-fat dairy each day. One serving is 1 cup (237 mL). ? 1 serving of nuts, seeds, or beans 5 times each week. ? 2-3 servings of heart-healthy fats. Healthy fats called omega-3 fatty acids are found in foods such as walnuts, flaxseeds, fortified milks, and eggs. These fats are also found in cold-water fish, such as sardines, salmon, and mackerel.  Limit how much you eat of: ? Canned or prepackaged foods. ? Food that is high in trans fat, such as some fried foods. ? Food that is high in saturated fat, such as fatty  meat. ? Desserts and other sweets, sugary drinks, and other foods with added sugar. ? Full-fat dairy products.  Do not salt foods before eating.  Do not eat more than 4 egg yolks a week.  Try to eat at least 2 vegetarian meals a week.  Eat more home-cooked food and less restaurant, buffet, and fast food.   Lifestyle  When eating at a restaurant, ask that your food be prepared with less salt or no salt, if possible.  If you drink alcohol: ? Limit how much you use to:  0-1 drink a day for women who are not pregnant.  0-2 drinks a day for men. ? Be aware of how much alcohol is in your drink. In the [place] one drink equals one 12 oz bottle of beer (355 mL), one 5 oz glass of wine (148 mL), or one 1 oz glass of hard liquor (44 mL). General information  Avoid eating more than 2,300 mg of salt a day. If you have hypertension, you may need to reduce your sodium intake to 1,500 mg a day.  Work  with your health care provider to maintain a healthy body weight or to lose weight. Ask what an ideal weight is for you.  Get at least 30 minutes of exercise that causes your heart to beat faster (aerobic exercise) most days of the week. Activities may include walking, swimming, or biking.  Work with your health care provider or dietitian to adjust your eating plan to your individual calorie needs. What foods should I eat? Fruits All fresh, dried, or frozen fruit. Canned fruit in natural juice (without added sugar). Vegetables Fresh or frozen vegetables (raw, steamed, roasted, or grilled). Low-sodium or reduced-sodium tomato and vegetable juice. Low-sodium or reduced-sodium tomato sauce and tomato paste. Low-sodium or reduced-sodium canned vegetables. Grains Whole-grain or whole-wheat bread. Whole-grain or whole-wheat pasta. Brown rice. [place]. Bulgur. Whole-grain and low-sodium cereals. Pita bread. Low-fat, low-sodium crackers. Whole-wheat flour tortillas. Meats and other  proteins Skinless chicken or [place]. Ground chicken or [place]. Pork with fat trimmed off. Fish and seafood. Egg whites. Dried beans, peas, or lentils. Unsalted nuts, nut butters, and seeds. Unsalted canned beans. Lean cuts of beef with fat trimmed off. Low-sodium, lean precooked or cured meat, such as sausages or meat loaves. Dairy Low-fat (1%) or fat-free (skim) milk. Reduced-fat, low-fat, or fat-free cheeses. Nonfat, low-sodium ricotta or cottage cheese. Low-fat or nonfat yogurt. Low-fat, low-sodium cheese. Fats and oils Soft margarine without trans fats. Vegetable oil. Reduced-fat, low-fat, or light mayonnaise and salad dressings (reduced-sodium). Canola, safflower, olive, avocado, soybean, and sunflower oils. Avocado. Seasonings and condiments Herbs. Spices. Seasoning mixes without salt. Other foods Unsalted popcorn and pretzels. Fat-free sweets. The items listed above may not be a complete list of foods and beverages you can eat. Contact a dietitian for more information. What foods should I avoid? Fruits Canned fruit in a light or heavy syrup. Fried fruit. Fruit in cream or butter sauce. Vegetables Creamed or fried vegetables. Vegetables in a cheese sauce. Regular canned vegetables (not low-sodium or reduced-sodium). Regular canned tomato sauce and paste (not low-sodium or reduced-sodium). Regular tomato and vegetable juice (not low-sodium or reduced-sodium). Angie Fava. Olives. Grains Baked goods made with fat, such as croissants, muffins, or some breads. Dry pasta or rice meal packs. Meats and other proteins Fatty cuts of meat. Ribs. Fried meat. Berniece Salines. Bologna, salami, and other precooked or cured meats, such as sausages or meat loaves. Fat from the back of a pig (fatback). Bratwurst. Salted nuts and seeds. Canned beans with added salt. Canned or smoked fish. Whole eggs or egg yolks. Chicken or [place] with skin. Dairy Whole or 2% milk, cream, and half-and-half. Whole or full-fat cream  cheese. Whole-fat or sweetened yogurt. Full-fat cheese. Nondairy creamers. Whipped toppings. Processed cheese and cheese spreads. Fats and oils Butter. Stick margarine. Lard. Shortening. Ghee. Bacon fat. Tropical oils, such as coconut, palm kernel, or palm oil. Seasonings and condiments Onion salt, garlic salt, seasoned salt, table salt, and sea salt. Worcestershire sauce. Tartar sauce. Barbecue sauce. Teriyaki sauce. Soy sauce, including reduced-sodium. Steak sauce. Canned and packaged gravies. Fish sauce. Oyster sauce. Cocktail sauce. Store-bought horseradish. Ketchup. Mustard. Meat flavorings and tenderizers. Bouillon cubes. Hot sauces. Pre-made or packaged marinades. Pre-made or packaged taco seasonings. Relishes. Regular salad dressings. Other foods Salted popcorn and pretzels. The items listed above may not be a complete list of foods and beverages you should avoid. Contact a dietitian for more information. Where to find more information  National Heart, Lung, and Blood Institute: https://wilson-eaton.com/  American Heart Association: www.heart.org  Academy of Nutrition and Dietetics:  www.eatright.[place]: www.kidney.org Summary  The DASH eating plan is a healthy eating plan that has been shown to reduce high blood pressure (hypertension). It may also reduce your risk for type 2 diabetes, heart disease, and stroke.  When on the DASH eating plan, aim to eat more fresh fruits and vegetables, whole grains, lean proteins, low-fat dairy, and heart-healthy fats.  With the DASH eating plan, you should limit salt (sodium) intake to 2,300 mg a day. If you have hypertension, you may need to reduce your sodium intake to 1,500 mg a day.  Work with your health care provider or dietitian to adjust your eating plan to your individual calorie needs. This information is not intended to replace advice given to you by your health care provider. Make sure you discuss any questions you  have with your health care provider. Document Revised: 04/02/2019 Document Reviewed: 04/02/2019 Elsevier Patient Education  2021 [employer].

## 2020-10-05 ENCOUNTER — Encounter: Payer: Self-pay | Admitting: Adult Health

## 2020-10-05 DIAGNOSIS — D3132 Benign neoplasm of left choroid: Secondary | ICD-10-CM | POA: Diagnosis not present

## 2020-10-05 DIAGNOSIS — H40013 Open angle with borderline findings, low risk, bilateral: Secondary | ICD-10-CM | POA: Diagnosis not present

## 2020-10-05 DIAGNOSIS — H2513 Age-related nuclear cataract, bilateral: Secondary | ICD-10-CM | POA: Diagnosis not present

## 2020-10-06 ENCOUNTER — Encounter: Payer: Self-pay | Admitting: *Deleted

## 2020-10-06 DIAGNOSIS — Z87891 Personal history of nicotine dependence: Secondary | ICD-10-CM

## 2020-10-20 DIAGNOSIS — C678 Malignant neoplasm of overlapping sites of bladder: Secondary | ICD-10-CM | POA: Diagnosis not present

## 2020-11-03 DIAGNOSIS — Z5111 Encounter for antineoplastic chemotherapy: Secondary | ICD-10-CM | POA: Diagnosis not present

## 2020-11-03 DIAGNOSIS — C678 Malignant neoplasm of overlapping sites of bladder: Secondary | ICD-10-CM | POA: Diagnosis not present

## 2020-11-08 ENCOUNTER — Ambulatory Visit: Payer: Medicare Other | Admitting: Pulmonary Disease

## 2020-11-08 ENCOUNTER — Encounter: Payer: Self-pay | Admitting: Pulmonary Disease

## 2020-11-08 ENCOUNTER — Other Ambulatory Visit: Payer: Self-pay

## 2020-11-08 VITALS — BP 118/74 | HR 74 | Temp 98.3°F | Ht 63.0 in | Wt 153.2 lb

## 2020-11-08 DIAGNOSIS — J432 Centrilobular emphysema: Secondary | ICD-10-CM

## 2020-11-08 MED ORDER — ALBUTEROL SULFATE (2.5 MG/3ML) 0.083% IN NEBU: 2.5000 mg | INHALATION_SOLUTION | Freq: Four times a day (QID) | RESPIRATORY_TRACT | 1 refills | Status: DC | PRN

## 2020-11-08 MED ORDER — BREZTRI AEROSPHERE 160-9-4.8 MCG/ACT IN AERO: 2.0000 | INHALATION_SPRAY | Freq: Two times a day (BID) | RESPIRATORY_TRACT | 0 refills | Status: DC

## 2020-11-08 MED ORDER — BREZTRI AEROSPHERE 160-9-4.8 MCG/ACT IN AERO
2.0000 | INHALATION_SPRAY | Freq: Two times a day (BID) | RESPIRATORY_TRACT | 6 refills | Status: DC
Start: 2020-11-08 — End: 2021-03-27

## 2020-11-08 MED ORDER — ALBUTEROL SULFATE HFA 108 (90 BASE) MCG/ACT IN AERS: 2.0000 | INHALATION_SPRAY | Freq: Four times a day (QID) | RESPIRATORY_TRACT | 6 refills | Status: AC | PRN

## 2020-11-08 NOTE — Patient Instructions (Addendum)
We will schedule you for an overnight oximetry test  We will refer you to pulmonary rehab  We will schedule you for pulmonary function tests in 3 months with follow up visit  Continue breztri inhaler 2 puffs twice daily  Use albuterol inhaler 1-2 puffs as needed every 4-6 hours as needed for shortness of breath or wheezing  We will order you a nebulizer machine and albuterol solution to be used as needed every 4-6 hours for shortness of breath or wheezing

## 2020-11-08 NOTE — Progress Notes (Signed)
Synopsis: Referred in June 2022 for COPD by Dorothyann Peng, NP  Subjective:   PATIENT ID: Samuel [name] GENDER: male DOB: [birth year], MRN: [id number]  HPI  Chief Complaint  Patient presents with   Consult    Patient reports he has some short of breath due to his COPD. This is about the same as his visit with the PCP.    Samuel [name] is a 70 year old male, former smoker with CKDIII, hyperlipidemia and hypertension who is referred to pulmonary clinic for evaluation of COPD.  He reports very limited physical activity due to limitations from his shortness of breath.  He lives a fairly sedentary life at this time.  He is accompanied by his wife today.  He is currently on Breztri 2 puffs twice daily where he transitioned from Trelegy back and January 2022 with notable improvement in his wheezing.  He does report increased frequency of wheezing in the evening that he notices when going to bed.  He had lung cancer screening CT chest 09/27/2020 with findings of diffuse centrilobular emphysema and no concerning nodules.  He does not have a nebulizer machine at home.  Past Medical History:  Diagnosis Date   Anxiety    Bladder cancer [facility])    urologist--- dr Tresa Moore   BPH with obstruction/lower urinary tract symptoms    CKD (chronic kidney disease), stage III ([place])    Congenital deformity of left hand    COPD (chronic obstructive pulmonary disease) ([place])    followed by pcp   Depression    DOE (dyspnea on exertion)    Essential hypertension    followed by pcp   Feeling of incomplete bladder emptying    Hematuria    History of adenomatous polyp of colon    Hyperlipidemia    Renal cysts, acquired, bilateral    Wears glasses      Family History  Problem Relation Age of Onset   Hypertension Mother    Stroke Mother    Heart disease Father    Breast cancer Sister    Prostate cancer Brother    Hypertension Sister    Colon cancer Neg Hx    Esophageal cancer Neg Hx     Pancreatic cancer Neg Hx    Rectal cancer Neg Hx    Stomach cancer Neg Hx      Social History   Socioeconomic History   Marital status: Married    Spouse name: Not on file   Number of children: Not on file   Years of education: Not on file   Highest education level: Not on file  Occupational History   Not on file  Tobacco Use   Smoking status: Former    Years: 58.00    Pack years: 0.00    Types: Cigarettes    Quit date: 01/16/2017    Years since quitting: 3.8   Smokeless tobacco: Never  Vaping Use   Vaping Use: Never used  Substance and Sexual Activity   Alcohol use: No   Drug use: Never   Sexual activity: Not on file  Other Topics Concern   Not on file  Social History Narrative   Retired    Married       He watches television   Social Determinants of Health   Financial Resource Strain: Medium Risk   Difficulty of Paying Living Expenses: Somewhat hard  Food Insecurity: No Food Insecurity   Worried About Charity fundraiser in the Last Year: Never true  Ran Out of Food in the Last Year: Never true  Transportation Needs: No Transportation Needs   Lack of Transportation (Medical): No   Lack of Transportation (Non-Medical): No  Physical Activity: Inactive   Days of Exercise per Week: 0 days   Minutes of Exercise per Session: 0 min  Stress: No Stress Concern Present   Feeling of Stress : Not at all  Social Connections: Moderately Isolated   Frequency of Communication with Friends and Family: More than three times a week   Frequency of Social Gatherings with Friends and Family: More than three times a week   Attends Religious Services: Never   Marine scientist or Organizations: No   Attends Music therapist: Never   Marital Status: Married  Human resources officer Violence: Not At Risk   Fear of Current or Ex-Partner: No   Emotionally Abused: No   Physically Abused: No   Sexually Abused: No     Allergies  Allergen Reactions   Penicillins Nausea  And Vomiting     Outpatient Medications Prior to Visit  Medication Sig Dispense Refill   acetaminophen (TYLENOL) 500 MG tablet Take 500 mg by mouth every 6 (six) hours as needed.     amLODipine (NORVASC) 10 MG tablet Take 10 mg by mouth daily.     atorvastatin (LIPITOR) 40 MG tablet Take 0.5 tablets (20 mg total) by mouth daily. Pt still has 20 mg, advised to take 2 tablets until finished then start 40 mg 1 tab daily (Patient taking differently: Take 20 mg by mouth daily. 40 mg 1 tab daily) 90 tablet 3   Budeson-Glycopyrrol-Formoterol (BREZTRI AEROSPHERE) 160-9-4.8 MCG/ACT AERO Use as directed.  [id number] Exp 8/23  4 boxes (Patient taking differently: Use as directed.  [id number] Exp 8/23  4 boxes) 5.9 g 0   Cholecalciferol (VITAMIN D3) 25 MCG (1000 UT) CAPS Take 1 capsule by mouth daily.     citalopram (CELEXA) 10 MG tablet Take 1 tablet (10 mg total) by mouth daily. 90 tablet 0   finasteride (PROSCAR) 5 MG tablet Take 5 mg by mouth daily.     metoprolol succinate (TOPROL-XL) 25 MG 24 hr tablet Take 25 mg by mouth daily.     tamsulosin (FLOMAX) 0.4 MG CAPS capsule Take 2 capsules by mouth at bedtime.     VALIUM 5 MG tablet Take 5 mg by mouth as needed.     vitamin B-12 (CYANOCOBALAMIN) 500 MCG tablet Take 1,000 mcg by mouth daily.     No facility-administered medications prior to visit.    Review of Systems  Constitutional:  Negative for chills, fever, malaise/fatigue and weight loss.  HENT:  Negative for congestion, sinus pain and sore throat.   Eyes: Negative.   Respiratory:  Positive for shortness of breath and wheezing. Negative for cough, hemoptysis and sputum production.   Cardiovascular:  Negative for chest pain, palpitations, orthopnea, claudication and leg swelling.  Gastrointestinal:  Negative for abdominal pain, heartburn, nausea and vomiting.  Genitourinary: Negative.   Musculoskeletal:  Negative for joint pain and myalgias.  Skin:  Negative for rash.   Neurological:  Negative for weakness.  Endo/Heme/Allergies: Negative.   Psychiatric/Behavioral: Negative.       Objective:   Vitals:   11/08/20 0923  BP: 118/74  Pulse: 74  Temp: 98.3 F (36.8 C)  TempSrc: Oral  SpO2: 98%  Weight: 153 lb 3.2 oz (69.5 kg)  Height: 5\' 3"  (1.6 m)     Physical Exam Constitutional:  General: He is not in acute distress.    Appearance: Normal appearance.  HENT:     Head: Normocephalic and atraumatic.  Eyes:     Extraocular Movements: Extraocular movements intact.     Conjunctiva/sclera: Conjunctivae normal.     Pupils: Pupils are equal, round, and reactive to light.  Cardiovascular:     Rate and Rhythm: Normal rate and regular rhythm.     Pulses: Normal pulses.     Heart sounds: Normal heart sounds. No murmur heard. Pulmonary:     Effort: Pulmonary effort is normal.     Breath sounds: Normal breath sounds. No wheezing, rhonchi or rales.  Abdominal:     General: Bowel sounds are normal.     Palpations: Abdomen is soft.  Musculoskeletal:        General: Deformity (LUE) present.     Right lower leg: No edema.     Left lower leg: No edema.  Lymphadenopathy:     Cervical: No cervical adenopathy.  Skin:    General: Skin is warm and dry.  Neurological:     General: No focal deficit present.     Mental Status: He is alert.  Psychiatric:        Mood and Affect: Mood normal.        Behavior: Behavior normal.        Thought Content: Thought content normal.        Judgment: Judgment normal.      CBC    Component Value Date/Time   WBC 10.1 04/04/2020 0748   RBC 5.01 04/04/2020 0748   HGB 13.9 09/15/2020 0659   HCT 41.0 09/15/2020 0659   PLT 437 (H) 04/04/2020 0748   MCV 87.0 04/04/2020 0748   MCH 28.7 04/04/2020 0748   MCHC 33.0 04/04/2020 0748   RDW 13.4 04/04/2020 0748   LYMPHSABS 1,364 04/04/2020 0748   MONOABS 0.9 02/19/2019 0721   EOSABS 263 04/04/2020 0748   BASOSABS 61 04/04/2020 0748   BMP Latest Ref Rng &  Units 09/15/2020 04/04/2020 02/19/2019  Glucose 70 - 99 mg/dL 120(H) 113(H) 115(H)  BUN 8 - 23 mg/dL 42(H) 48(H) 34(H)  Creatinine 0.61 - 1.24 mg/dL 2.10(H) 2.14(H) 1.75(H)  BUN/Creat Ratio 6 - 22 (calc) - 22 -  Sodium 135 - 145 mmol/L 141 140 140  Potassium 3.5 - 5.1 mmol/L 4.4 5.4(H) 5.3 No hemolysis seen(H)  Chloride 98 - 111 mmol/L 107 105 104  CO2 20 - 32 mmol/L - 27 26  Calcium 8.6 - 10.3 mg/dL - 10.3 10.4   Chest imaging: CT Lung Cancer Screening 09/27/20 1. Lung-RADS 2, benign appearance or behavior. Continue annual screening with low-dose chest CT without contrast in 12 months. 2.  Emphysema  PFT: No flowsheet data found.  EKG 09/15/20 Normal Sinus rhythm, normal intervals, possible LVH      Assessment & Plan:   Centrilobular emphysema ([place]) - Plan: Pulmonary function test, Budeson-Glycopyrrol-Formoterol (BREZTRI AEROSPHERE) 160-9-4.8 MCG/ACT AERO, albuterol (VENTOLIN HFA) 108 (90 Base) MCG/ACT inhaler, Pulse oximetry, overnight, AMB referral to pulmonary rehabilitation, Ambulatory Referral for DME, CANCELED: Pulse oximetry, overnight  Discussion: Samuel [name] is a 70 year old male, former smoker with CKDIII, hyperlipidemia and hypertension who is referred to pulmonary clinic for evaluation of COPD.  He has diffuse centrilobular emphysema as demonstrated on recent CT chest lung cancer screening scan.  We will obtain pulmonary function tests at follow-up visit in 3 months.  He is to continue Breztri inhaler therapy, 2 puffs twice daily.  He can use albuterol inhaler 1 to 2 puffs every 4-6 hours as needed for shortness of breath and wheezing.  We will order him a nebulizer machine and albuterol nebulizer solution to use as needed as well.  Due to his significant limitations in activity due to his respiratory status we will refer him to pulmonary rehab.  Follow-up in 3 months.  Freda Jackson, MD [facility] Office: [phone]    Current  Outpatient Medications:    acetaminophen (TYLENOL) 500 MG tablet, Take 500 mg by mouth every 6 (six) hours as needed., Disp: , Rfl:    albuterol (PROVENTIL) (2.5 MG/3ML) 0.083% nebulizer solution, Take 3 mLs (2.5 mg total) by nebulization every 6 (six) hours as needed for wheezing or shortness of breath., Disp: 1080 mL, Rfl: 1   albuterol (VENTOLIN HFA) 108 (90 Base) MCG/ACT inhaler, Inhale 2 puffs into the lungs every 6 (six) hours as needed for wheezing or shortness of breath., Disp: 8 g, Rfl: 6   amLODipine (NORVASC) 10 MG tablet, Take 10 mg by mouth daily., Disp: , Rfl:    atorvastatin (LIPITOR) 40 MG tablet, Take 0.5 tablets (20 mg total) by mouth daily. Pt still has 20 mg, advised to take 2 tablets until finished then start 40 mg 1 tab daily (Patient taking differently: Take 20 mg by mouth daily. 40 mg 1 tab daily), Disp: 90 tablet, Rfl: 3   Budeson-Glycopyrrol-Formoterol (BREZTRI AEROSPHERE) 160-9-4.8 MCG/ACT AERO, Use as directed.  [id number] Exp 8/23  4 boxes (Patient taking differently: Use as directed.  [id number] Exp 8/23  4 boxes), Disp: 5.9 g, Rfl: 0   Budeson-Glycopyrrol-Formoterol (BREZTRI AEROSPHERE) 160-9-4.8 MCG/ACT AERO, Inhale 2 puffs into the lungs in the morning and at bedtime., Disp: 10.7 g, Rfl: 6   Budeson-Glycopyrrol-Formoterol (BREZTRI AEROSPHERE) 160-9-4.8 MCG/ACT AERO, Inhale 2 puffs into the lungs in the morning and at bedtime., Disp: 11.8 g, Rfl: 0   Cholecalciferol (VITAMIN D3) 25 MCG (1000 UT) CAPS, Take 1 capsule by mouth daily., Disp: , Rfl:    citalopram (CELEXA) 10 MG tablet, Take 1 tablet (10 mg total) by mouth daily., Disp: 90 tablet, Rfl: 0   finasteride (PROSCAR) 5 MG tablet, Take 5 mg by mouth daily., Disp: , Rfl:    metoprolol succinate (TOPROL-XL) 25 MG 24 hr tablet, Take 25 mg by mouth daily., Disp: , Rfl:    tamsulosin (FLOMAX) 0.4 MG CAPS capsule, Take 2 capsules by mouth at bedtime., Disp: , Rfl:    VALIUM 5 MG tablet, Take 5 mg by mouth as  needed., Disp: , Rfl:    vitamin B-12 (CYANOCOBALAMIN) 500 MCG tablet, Take 1,000 mcg by mouth daily., Disp: , Rfl:

## 2020-11-09 ENCOUNTER — Ambulatory Visit (INDEPENDENT_AMBULATORY_CARE_PROVIDER_SITE_OTHER): Payer: Medicare Other | Admitting: Adult Health

## 2020-11-09 VITALS — BP 120/68 | HR 87 | Temp 98.5°F | Ht 63.0 in | Wt 154.0 lb

## 2020-11-09 DIAGNOSIS — F419 Anxiety disorder, unspecified: Secondary | ICD-10-CM | POA: Diagnosis not present

## 2020-11-09 NOTE — Progress Notes (Signed)
Subjective:    Patient ID: Samuel [name], male    DOB: [birth year], 70 y.o.   MRN: [id number]  HPI 70 year old male who  has a past medical history of Anxiety, Bladder cancer ([place]), BPH with obstruction/lower urinary tract symptoms, CKD (chronic kidney disease), stage III ([place]), Congenital deformity of left hand, COPD (chronic obstructive pulmonary disease) ([place]), Depression, DOE (dyspnea on exertion), Essential hypertension, Feeling of incomplete bladder emptying, Hematuria, History of adenomatous polyp of colon, Hyperlipidemia, Renal cysts, acquired, bilateral, and Wears glasses.  He presents to the office today for 1 month follow-up regarding anxiety.  When he was last seen he reported worsening anxiety.  He was recently diagnosed with bladder cancer and had surgical removal of the mass.  He has started 6 weeks of BCG treatment.  He and his wife both felt as though his anxiety had increased since the diagnosis.  At this time he was on Wellbutrin but this was originally started to help him quit smoking and he did not feel as though it was helping much with his anxiety.  We weaned him off Wellbutrin and started him on Celexa 10 mg daily.  He and his wife report that his mood and anxiety is much improved since starting Celexa. He has not experienced any side effects of the medication. He is no longer having outbursts, is making jokes and doing pranks again.   Review of Systems See HPI   Past Medical History:  Diagnosis Date   Anxiety    Bladder cancer [facility])    urologist--- dr Tresa Moore   BPH with obstruction/lower urinary tract symptoms    CKD (chronic kidney disease), stage III ([place])    Congenital deformity of left hand    COPD (chronic obstructive pulmonary disease) ([place])    followed by pcp   Depression    DOE (dyspnea on exertion)    Essential hypertension    followed by pcp   Feeling of incomplete bladder emptying    Hematuria    History of adenomatous polyp of colon     Hyperlipidemia    Renal cysts, acquired, bilateral    Wears glasses     Social History   Socioeconomic History   Marital status: Married    Spouse name: Not on file   Number of children: Not on file   Years of education: Not on file   Highest education level: Not on file  Occupational History   Not on file  Tobacco Use   Smoking status: Former    Years: 58.00    Pack years: 0.00    Types: Cigarettes    Quit date: 01/16/2017    Years since quitting: 3.8   Smokeless tobacco: Never  Vaping Use   Vaping Use: Never used  Substance and Sexual Activity   Alcohol use: No   Drug use: Never   Sexual activity: Not on file  Other Topics Concern   Not on file  Social History Narrative   Retired    Married       He watches television   Social Determinants of Health   Financial Resource Strain: Medium Risk   Difficulty of Paying Living Expenses: Somewhat hard  Food Insecurity: No Food Insecurity   Worried About Charity fundraiser in the Last Year: Never true   Arboriculturist in the Last Year: Never true  Transportation Needs: No Transportation Needs   Lack of Transportation (Medical): No   Lack of Transportation (Non-Medical): No  Physical Activity: Inactive   Days of Exercise per Week: 0 days   Minutes of Exercise per Session: 0 min  Stress: No Stress Concern Present   Feeling of Stress : Not at all  Social Connections: Moderately Isolated   Frequency of Communication with Friends and Family: More than three times a week   Frequency of Social Gatherings with Friends and Family: More than three times a week   Attends Religious Services: Never   Marine scientist or Organizations: No   Attends Archivist Meetings: Never   Marital Status: Married  Human resources officer Violence: Not At Risk   Fear of Current or Ex-Partner: No   Emotionally Abused: No   Physically Abused: No   Sexually Abused: No    Past Surgical History:  Procedure Laterality Date    COLONOSCOPY WITH PROPOFOL  last one 02-04-2017  dr Henrene Pastor   CYSTOSCOPY W/ RETROGRADES Bilateral 09/15/2020   Procedure: CYSTOSCOPY WITH RETROGRADE PYELOGRAM;  Surgeon: Alexis Frock, MD;  [facility];  Service: Urology;  Laterality: Bilateral;   NO PAST SURGERIES     TRANSURETHRAL RESECTION OF BLADDER TUMOR N/A 09/15/2020   Procedure: BLADDER BIOPSY FULGERATION;  Surgeon: Alexis Frock, MD;  Location: [facility];  Service: Urology;  Laterality: N/A;  43 MINS    Family History  Problem Relation Age of Onset   Hypertension Mother    Stroke Mother    Heart disease Father    Breast cancer Sister    Prostate cancer Brother    Hypertension Sister    Colon cancer Neg Hx    Esophageal cancer Neg Hx    Pancreatic cancer Neg Hx    Rectal cancer Neg Hx    Stomach cancer Neg Hx     Allergies  Allergen Reactions   Penicillins Nausea And Vomiting    Current Outpatient Medications on File Prior to Visit  Medication Sig Dispense Refill   acetaminophen (TYLENOL) 500 MG tablet Take 500 mg by mouth every 6 (six) hours as needed.     albuterol (PROVENTIL) (2.5 MG/3ML) 0.083% nebulizer solution Take 3 mLs (2.5 mg total) by nebulization every 6 (six) hours as needed for wheezing or shortness of breath. 1080 mL 1   albuterol (VENTOLIN HFA) 108 (90 Base) MCG/ACT inhaler Inhale 2 puffs into the lungs every 6 (six) hours as needed for wheezing or shortness of breath. 8 g 6   amLODipine (NORVASC) 10 MG tablet Take 10 mg by mouth daily.     atorvastatin (LIPITOR) 40 MG tablet Take 0.5 tablets (20 mg total) by mouth daily. Pt still has 20 mg, advised to take 2 tablets until finished then start 40 mg 1 tab daily (Patient taking differently: Take 20 mg by mouth daily. 40 mg 1 tab daily) 90 tablet 3   Budeson-Glycopyrrol-Formoterol (BREZTRI AEROSPHERE) 160-9-4.8 MCG/ACT AERO Use as directed.  [id number] Exp 8/23  4 boxes (Patient taking differently: Use as directed.   [id number] Exp 8/23  4 boxes) 5.9 g 0   Budeson-Glycopyrrol-Formoterol (BREZTRI AEROSPHERE) 160-9-4.8 MCG/ACT AERO Inhale 2 puffs into the lungs in the morning and at bedtime. 10.7 g 6   Budeson-Glycopyrrol-Formoterol (BREZTRI AEROSPHERE) 160-9-4.8 MCG/ACT AERO Inhale 2 puffs into the lungs in the morning and at bedtime. 11.8 g 0   Cholecalciferol (VITAMIN D3) 25 MCG (1000 UT) CAPS Take 1 capsule by mouth daily.     citalopram (CELEXA) 10 MG tablet Take 1 tablet (10 mg total) by mouth daily. [place]  tablet 0   finasteride (PROSCAR) 5 MG tablet Take 5 mg by mouth daily.     metoprolol succinate (TOPROL-XL) 25 MG 24 hr tablet Take 25 mg by mouth daily.     tamsulosin (FLOMAX) 0.4 MG CAPS capsule Take 2 capsules by mouth at bedtime.     VALIUM 5 MG tablet Take 5 mg by mouth as needed.     vitamin B-12 (CYANOCOBALAMIN) 500 MCG tablet Take 1,000 mcg by mouth daily.     No current facility-administered medications on file prior to visit.    There were no vitals taken for this visit.      Objective:   Physical Exam Vitals and nursing note reviewed.  Constitutional:      Appearance: Normal appearance.  Musculoskeletal:        General: Normal range of motion.  Skin:    General: Skin is warm and dry.  Neurological:     General: No focal deficit present.     Mental Status: He is alert and oriented to person, place, and time.  Psychiatric:        Mood and Affect: Mood normal.        Behavior: Behavior normal.      Assessment & Plan:  1. Anxiety - Much improved - Continue with Celexa 10 mg  - Follow up as needed  Dorothyann Peng, NP

## 2020-11-10 DIAGNOSIS — C678 Malignant neoplasm of overlapping sites of bladder: Secondary | ICD-10-CM | POA: Diagnosis not present

## 2020-11-14 DIAGNOSIS — J432 Centrilobular emphysema: Secondary | ICD-10-CM | POA: Diagnosis not present

## 2020-11-16 ENCOUNTER — Telehealth (HOSPITAL_COMMUNITY): Payer: Self-pay

## 2020-11-16 NOTE — Telephone Encounter (Signed)
Called and spoke with pt in regards to PR, pt stated he would like to attend the PR at [facility]. Adv pt I will fax PR referral to them.   Closed referral

## 2020-11-17 DIAGNOSIS — C678 Malignant neoplasm of overlapping sites of bladder: Secondary | ICD-10-CM | POA: Diagnosis not present

## 2020-11-17 DIAGNOSIS — Z5111 Encounter for antineoplastic chemotherapy: Secondary | ICD-10-CM | POA: Diagnosis not present

## 2020-11-20 ENCOUNTER — Encounter: Payer: Self-pay | Admitting: Adult Health

## 2020-11-20 DIAGNOSIS — E782 Mixed hyperlipidemia: Secondary | ICD-10-CM

## 2020-11-21 MED ORDER — ATORVASTATIN CALCIUM 40 MG PO TABS: 40.0000 mg | ORAL_TABLET | Freq: Every day | ORAL | 0 refills | Status: DC

## 2020-11-22 DIAGNOSIS — J432 Centrilobular emphysema: Secondary | ICD-10-CM | POA: Diagnosis not present

## 2020-11-24 DIAGNOSIS — C678 Malignant neoplasm of overlapping sites of bladder: Secondary | ICD-10-CM | POA: Diagnosis not present

## 2020-11-24 DIAGNOSIS — Z5111 Encounter for antineoplastic chemotherapy: Secondary | ICD-10-CM | POA: Diagnosis not present

## 2020-11-26 ENCOUNTER — Other Ambulatory Visit: Payer: Self-pay | Admitting: Adult Health

## 2020-11-26 DIAGNOSIS — E782 Mixed hyperlipidemia: Secondary | ICD-10-CM

## 2020-11-28 DIAGNOSIS — N1832 Chronic kidney disease, stage 3b: Secondary | ICD-10-CM | POA: Diagnosis not present

## 2020-12-01 DIAGNOSIS — Z5111 Encounter for antineoplastic chemotherapy: Secondary | ICD-10-CM | POA: Diagnosis not present

## 2020-12-01 DIAGNOSIS — C678 Malignant neoplasm of overlapping sites of bladder: Secondary | ICD-10-CM | POA: Diagnosis not present

## 2020-12-05 DIAGNOSIS — C679 Malignant neoplasm of bladder, unspecified: Secondary | ICD-10-CM | POA: Diagnosis not present

## 2020-12-05 DIAGNOSIS — I129 Hypertensive chronic kidney disease with stage 1 through stage 4 chronic kidney disease, or unspecified chronic kidney disease: Secondary | ICD-10-CM | POA: Diagnosis not present

## 2020-12-05 DIAGNOSIS — J449 Chronic obstructive pulmonary disease, unspecified: Secondary | ICD-10-CM | POA: Diagnosis not present

## 2020-12-05 DIAGNOSIS — E559 Vitamin D deficiency, unspecified: Secondary | ICD-10-CM | POA: Diagnosis not present

## 2020-12-05 DIAGNOSIS — E875 Hyperkalemia: Secondary | ICD-10-CM | POA: Diagnosis not present

## 2020-12-07 ENCOUNTER — Telehealth: Payer: Self-pay | Admitting: Pulmonary Disease

## 2020-12-07 NOTE — Telephone Encounter (Signed)
Please let the patient know he does qualify for supplemental oxygen at night when sleeping based on his overnight oximetry testing. Also based on the results of the overnight oxygen test he did have an increased amount of events per hour in his oxygen desaturations that could warrant an in-lab sleep study for further assessment of sleep disordered breathing for possible sleep apnea.   If he would like, we can schedule him for an in lab sleep study to further investigate this concern or we can send an order for 2L supplemental oxygen to be used at night when sleeping.   Thanks, Wille Glaser

## 2020-12-08 ENCOUNTER — Encounter: Payer: Self-pay | Admitting: Adult Health

## 2020-12-08 DIAGNOSIS — Z5111 Encounter for antineoplastic chemotherapy: Secondary | ICD-10-CM | POA: Diagnosis not present

## 2020-12-08 DIAGNOSIS — C678 Malignant neoplasm of overlapping sites of bladder: Secondary | ICD-10-CM | POA: Diagnosis not present

## 2020-12-11 NOTE — Telephone Encounter (Signed)
Called patient but he did not answer. Left message for him to call back.  

## 2020-12-15 DIAGNOSIS — Z5111 Encounter for antineoplastic chemotherapy: Secondary | ICD-10-CM | POA: Diagnosis not present

## 2020-12-15 DIAGNOSIS — C678 Malignant neoplasm of overlapping sites of bladder: Secondary | ICD-10-CM | POA: Diagnosis not present

## 2020-12-24 ENCOUNTER — Other Ambulatory Visit: Payer: Self-pay | Admitting: Adult Health

## 2020-12-24 DIAGNOSIS — F419 Anxiety disorder, unspecified: Secondary | ICD-10-CM

## 2020-12-31 ENCOUNTER — Other Ambulatory Visit: Payer: Self-pay | Admitting: Adult Health

## 2020-12-31 DIAGNOSIS — F419 Anxiety disorder, unspecified: Secondary | ICD-10-CM

## 2021-01-10 ENCOUNTER — Telehealth: Payer: Self-pay | Admitting: Pulmonary Disease

## 2021-01-10 DIAGNOSIS — J9611 Chronic respiratory failure with hypoxia: Secondary | ICD-10-CM

## 2021-01-10 NOTE — Telephone Encounter (Signed)
See 01/10/21 encounter.

## 2021-01-10 NOTE — Telephone Encounter (Signed)
Called and spoke with Patient's Wife Earlie Server (DPR).  Dr. August Albino ONO results given.  Understanding stated.  Earlie Server stated she would discuss with Patient later today and call office back with Patient's decision.  Will hold message open until Patient call back.    Freddi Starr, MD     10:03 AM Note Please let the patient know he does qualify for supplemental oxygen at night when sleeping based on his overnight oximetry testing. Also based on the results of the overnight oxygen test he did have an increased amount of events per hour in his oxygen desaturations that could warrant an in-lab sleep study for further assessment of sleep disordered breathing for possible sleep apnea.    If he would like, we can schedule him for an in lab sleep study to further investigate this concern or we can send an order for 2L supplemental oxygen to be used at night when sleeping.    Thanks, Wille Glaser

## 2021-01-12 DIAGNOSIS — J9611 Chronic respiratory failure with hypoxia: Secondary | ICD-10-CM | POA: Diagnosis not present

## 2021-01-12 NOTE — Telephone Encounter (Signed)
Spoke with the pt's spouse  He does not want the in lab study, but does want to have the o2 2lpm with sleep  I have sent order for this  Nothing further needed

## 2021-01-30 ENCOUNTER — Telehealth: Payer: Self-pay | Admitting: Pharmacist

## 2021-01-30 NOTE — Chronic Care Management (AMB) (Signed)
Chronic Care Management Pharmacy Assistant   Name: Samuel [name]  MRN: [id number] DOB: 08-26-1950  Reason for Encounter: General Assessment Call    Conditions to be addressed/monitored: HTN, HLD  Recent office visits:  11-09-2020 Dorothyann Peng, NP - Patient presented for anxiety. No medication changes.   10-04-2020 - Patient presented for Anxiety. Stopped Bupropion and Prescribed Citalopram.  Recent consult visits:  11-08-2020 Freddi Starr, MD (Pulmonology) - Patient presented for Centrilobular emphysema. Prescribed Albuterol Sulfate, and [facility] visits:  Medication Reconciliation was completed by comparing discharge summary, patient's EMR and Pharmacy list, and upon discussion with patient.   Admitted to the hospital on 09/15/2020 due to Bladder Biopsy. Discharge date was 09/15/2020. Discharged from [facility].     New?Medications Started at [facility] Discharge:?? -started the following medications Senna-docusate (Senokot-S) Take 1 tablet by mouth 2 (two) times daily. While taking strong pain meds to prevent constipation Tramadol (Ultram) Take 1-2 tablets (50-100 mg total) by mouth every 6 (six)hours as needed for moderate pain or severe pain. Medication Changes at Hospital Discharge: Cephalexin [facility]) Take 1 capsule (500 mg total) by mouth 2 (two) times daily 3 days. Begin day before next Urology nurse practitioner visit. Medications Discontinued at Hospital Discharge: -None   Medications that remain the same after Hospital Discharge:??  -All other medications will remain the same.      Medications: Outpatient Encounter Medications as of 01/30/2021  Medication Sig   acetaminophen (TYLENOL) 500 MG tablet Take 500 mg by mouth every 6 (six) hours as needed.   albuterol (PROVENTIL) (2.5 MG/3ML) 0.083% nebulizer solution Take 3 mLs (2.5 mg total) by nebulization every 6 (six) hours as needed for wheezing or shortness of breath.    albuterol (VENTOLIN HFA) 108 (90 Base) MCG/ACT inhaler Inhale 2 puffs into the lungs every 6 (six) hours as needed for wheezing or shortness of breath.   amLODipine (NORVASC) 10 MG tablet Take 10 mg by mouth daily.   atorvastatin (LIPITOR) 40 MG tablet Take 1 tablet (40 mg total) by mouth daily. Take 1 tab daily   Budeson-Glycopyrrol-Formoterol (BREZTRI AEROSPHERE) 160-9-4.8 MCG/ACT AERO Use as directed.  [id number] Exp 8/23  4 boxes (Patient taking differently: Use as directed.  [id number]#[id number] Exp 8/23  4 boxes)   Budeson-Glycopyrrol-Formoterol (BREZTRI AEROSPHERE) 160-9-4.8 MCG/ACT AERO Inhale 2 puffs into the lungs in the morning and at bedtime.   Budeson-Glycopyrrol-Formoterol (BREZTRI AEROSPHERE) 160-9-4.8 MCG/ACT AERO Inhale 2 puffs into the lungs in the morning and at bedtime.   Cholecalciferol (VITAMIN D3) 25 MCG (1000 UT) CAPS Take 1 capsule by mouth daily.   citalopram (CELEXA) 10 MG tablet TAKE 1 TABLET(10 MG) BY MOUTH DAILY   finasteride (PROSCAR) 5 MG tablet Take 5 mg by mouth daily.   metoprolol succinate (TOPROL-XL) 25 MG 24 hr tablet Take 25 mg by mouth daily.   tamsulosin (FLOMAX) 0.4 MG CAPS capsule Take 2 capsules by mouth at bedtime.   VALIUM 5 MG tablet Take 5 mg by mouth as needed.   vitamin B-12 (CYANOCOBALAMIN) 500 MCG tablet Take 1,000 mcg by mouth daily.   No facility-administered encounter medications on file as of 01/30/2021.  Reviewed chart prior to disease state call. Spoke with patient regarding BP  Recent Office Vitals: BP Readings from Last 3 Encounters:  11/09/20 120/68  11/08/20 118/74  10/04/20 100/68   Pulse Readings from Last 3 Encounters:  11/09/20 87  11/08/20 74  10/04/20 89    Wt Readings from Last  3 Encounters:  11/09/20 154 lb (69.9 kg)  11/08/20 153 lb 3.2 oz (69.5 kg)  10/04/20 149 lb (67.6 kg)     Kidney Function Lab Results  Component Value Date/Time   CREATININE 2.10 (H) 09/15/2020 06:59 AM   CREATININE 2.14 (H) 04/04/2020  07:48 AM   CREATININE 1.75 (H) 02/19/2019 07:21 AM   GFR 38.83 (L) 02/19/2019 07:21 AM   GFRNONAA 30 (L) 04/04/2020 07:48 AM   GFRAA 35 (L) 04/04/2020 07:48 AM    BMP Latest Ref Rng & Units 09/15/2020 04/04/2020 02/19/2019  Glucose 70 - 99 mg/dL 120(H) 113(H) 115(H)  BUN 8 - 23 mg/dL 42(H) 48(H) 34(H)  Creatinine 0.61 - 1.24 mg/dL 2.10(H) 2.14(H) 1.75(H)  BUN/Creat Ratio 6 - 22 (calc) - 22 -  Sodium 135 - 145 mmol/L 141 140 140  Potassium 3.5 - 5.1 mmol/L 4.4 5.4(H) 5.3 No hemolysis seen(H)  Chloride 98 - 111 mmol/L 107 105 104  CO2 20 - 32 mmol/L - 27 26  Calcium 8.6 - 10.3 mg/dL - 10.3 10.4   Spoke to patients wife for the call   Current antihypertensive regimen:  Amlodipine 10 mg 1 tablet daily - in AM Metoprolol succinate 25 mg 1 tablet daily - in AM How often are you checking your Blood Pressure? weekly Current home BP readings: 130/75 What recent interventions/DTPs have been made by any provider to improve Blood Pressure control since last CPP Visit: Wife reports no changes Any recent hospitalizations or ED visits since last visit with CPP? None What diet changes have been made to improve Blood Pressure Control?  Wife reports he does not always eat breakfast and they are eating most meals at home and watching the sodium. For lunch and dinner they will have soups salads and sometimes meat and vegetable What exercise is being done to improve your Blood Pressure Control?  Wife reports he is getting around well in the home.  Adherence Review: Is the patient currently on ACE/ARB medication? No Does the patient have >5 day gap between last estimated fill dates? No   01/30/2021 Name: Samuel [name] MRN: [id number] DOB: 31-Dec-1950 Samuel [name] is a 70 y.o. year old male who is a primary care patient of Dorothyann Peng, NP.  Comprehensive medication review performed; Spoke to patient regarding cholesterol  Lipid Panel    Component Value Date/Time   CHOL 208  (H) 04/04/2020 0748   TRIG 99 04/04/2020 0748   HDL 58 04/04/2020 0748   LDLCALC 129 (H) 04/04/2020 0748    10-year ASCVD risk score: The 10-year ASCVD risk score (Arnett DK, et al., 2019) is: 19.3%   Values used to calculate the score:     Age: 70 years     Sex: Male     Is Non-Hispanic African American: No     Diabetic: No     Tobacco smoker: No     Systolic Blood Pressure: 425 mmHg     Is BP treated: Yes     HDL Cholesterol: 58 mg/dL     Total Cholesterol: 208 mg/dL  Current antihyperlipidemic regimen:  Atorvastatin 40 mg 1 tablet daily ASCVD risk enhancing conditions: age >7 and HTN What recent interventions/DTPs have been made by any provider to improve Cholesterol control since last CPP Visit: None Any recent hospitalizations or ED visits since last visit with CPP? None  Adherence Review: Does the patient have >5 day gap between last estimated fill dates? No   Care Gaps: COVID Vaccines - Overdue Zoster  Vaccines - Overdue Flu Vaccines - Overdue AWV - Done 08-16-20 CCM - 03-17-21  Star Rating Drugs: Atorvastatin (Lipitor) 40 mg - Last filled 11-21-2020 90 DS at [facility]  Notes:  Spoke to Wife for the call she reports all is well , no concerns or issues she confirmed he is taking medication as prescribed no side effects. Advised her of follow up appointment in November and she was in agreement.  [place] Clinical Pharmacist Assistant [phone]

## 2021-02-11 DIAGNOSIS — J9611 Chronic respiratory failure with hypoxia: Secondary | ICD-10-CM | POA: Diagnosis not present

## 2021-02-19 DIAGNOSIS — C678 Malignant neoplasm of overlapping sites of bladder: Secondary | ICD-10-CM | POA: Diagnosis not present

## 2021-02-19 DIAGNOSIS — R3915 Urgency of urination: Secondary | ICD-10-CM | POA: Diagnosis not present

## 2021-03-14 DIAGNOSIS — J9611 Chronic respiratory failure with hypoxia: Secondary | ICD-10-CM | POA: Diagnosis not present

## 2021-03-26 ENCOUNTER — Telehealth: Payer: Self-pay | Admitting: Pharmacist

## 2021-03-26 NOTE — Chronic Care Management (AMB) (Signed)
    Chronic Care Management Pharmacy Assistant   Name: Samuel [name]  MRN: [id number] DOB: 11/27/1950  03/26/21 APPOINTMENT REMINDER    Patients wife was asked to remind husband to bring bp cuff,  all medications, supplements and any blood glucose and blood pressure readings for review with [name], Pharm. D, for office visit on 03/27/21 at 9.   Care Gaps: COVID Vaccines - Overdue Zoster Vaccines - Overdue Flu Vaccines - Overdue AWV -  4/22 BP- 120/68 (11/09/20)  Star Rating Drug: Atorvastatin (Lipitor) 40 mg - Last filled 10/102022 45 DS at [facility]  Any gaps in medications fill history? None  Medications: Outpatient Encounter Medications as of 03/26/2021  Medication Sig   acetaminophen (TYLENOL) 500 MG tablet Take 500 mg by mouth every 6 (six) hours as needed.   albuterol (PROVENTIL) (2.5 MG/3ML) 0.083% nebulizer solution Take 3 mLs (2.5 mg total) by nebulization every 6 (six) hours as needed for wheezing or shortness of breath.   albuterol (VENTOLIN HFA) 108 (90 Base) MCG/ACT inhaler Inhale 2 puffs into the lungs every 6 (six) hours as needed for wheezing or shortness of breath.   amLODipine (NORVASC) 10 MG tablet Take 10 mg by mouth daily.   atorvastatin (LIPITOR) 40 MG tablet Take 1 tablet (40 mg total) by mouth daily. Take 1 tab daily   Budeson-Glycopyrrol-Formoterol (BREZTRI AEROSPHERE) 160-9-4.8 MCG/ACT AERO Use as directed.  ZJQ#[id number] Exp 8/23  4 boxes (Patient taking differently: Use as directed.  IOX#[id number] Exp 8/23  4 boxes)   Budeson-Glycopyrrol-Formoterol (BREZTRI AEROSPHERE) 160-9-4.8 MCG/ACT AERO Inhale 2 puffs into the lungs in the morning and at bedtime.   Budeson-Glycopyrrol-Formoterol (BREZTRI AEROSPHERE) 160-9-4.8 MCG/ACT AERO Inhale 2 puffs into the lungs in the morning and at bedtime.   Cholecalciferol (VITAMIN D3) 25 MCG (1000 UT) CAPS Take 1 capsule by mouth daily.   citalopram (CELEXA) 10 MG tablet TAKE 1 TABLET(10 MG) BY  MOUTH DAILY   finasteride (PROSCAR) 5 MG tablet Take 5 mg by mouth daily.   metoprolol succinate (TOPROL-XL) 25 MG 24 hr tablet Take 25 mg by mouth daily.   tamsulosin (FLOMAX) 0.4 MG CAPS capsule Take 2 capsules by mouth at bedtime.   VALIUM 5 MG tablet Take 5 mg by mouth as needed.   vitamin B-12 (CYANOCOBALAMIN) 500 MCG tablet Take 1,000 mcg by mouth daily.   No facility-administered encounter medications on file as of 03/26/2021.    [place] Clinical Pharmacist Assistant [phone]

## 2021-03-27 ENCOUNTER — Encounter: Payer: Self-pay | Admitting: Adult Health

## 2021-03-27 ENCOUNTER — Other Ambulatory Visit: Payer: Self-pay | Admitting: Adult Health

## 2021-03-27 ENCOUNTER — Other Ambulatory Visit: Payer: Self-pay | Admitting: *Deleted

## 2021-03-27 ENCOUNTER — Ambulatory Visit (INDEPENDENT_AMBULATORY_CARE_PROVIDER_SITE_OTHER): Payer: Medicare Other | Admitting: Pharmacist

## 2021-03-27 VITALS — BP 136/70

## 2021-03-27 DIAGNOSIS — I10 Essential (primary) hypertension: Secondary | ICD-10-CM

## 2021-03-27 DIAGNOSIS — F419 Anxiety disorder, unspecified: Secondary | ICD-10-CM

## 2021-03-27 DIAGNOSIS — J449 Chronic obstructive pulmonary disease, unspecified: Secondary | ICD-10-CM

## 2021-03-27 MED ORDER — CITALOPRAM HYDROBROMIDE 20 MG PO TABS: 20.0000 mg | ORAL_TABLET | Freq: Every day | ORAL | 1 refills | Status: DC

## 2021-03-27 MED ORDER — BREZTRI AEROSPHERE 160-9-4.8 MCG/ACT IN AERO
INHALATION_SPRAY | RESPIRATORY_TRACT | 0 refills | Status: DC
Start: 2021-03-27 — End: 2021-03-30

## 2021-03-27 MED ORDER — BREZTRI AEROSPHERE 160-9-4.8 MCG/ACT IN AERO: 2.0000 | INHALATION_SPRAY | Freq: Two times a day (BID) | RESPIRATORY_TRACT | 11 refills | Status: DC

## 2021-03-27 NOTE — Patient Instructions (Signed)
Hi [name] and Samuel [name],  It was great to catch up with you again! I will let you know what [name] says once I have a chance to talk to him. In the meantime, try some of those exercises to help strengthen your bladder muscle.  Don't forget to call Dr. Erin Fulling and Dr. T[facility] to see what they say about his current symptoms.   Please reach out to me if you have any questions or need anything before our follow up!  Best, Samuel [name]  [name], PharmD, [name] [facility]   Visit Information   Goals Addressed   None    Patient Care Plan: CCM Pharmacy Care Plan     Problem Identified: Problem: Hypertension, Hyperlipidemia, COPD, Depression, and BPH      Long-Range Goal: Patient-Specific Goal   Start Date: 09/28/2020  Expected End Date: 09/28/2021  Recent Progress: On track  Priority: High  Note:   Current Barriers:  Unable to independently afford treatment regimen Unable to independently monitor therapeutic efficacy  Pharmacist Clinical Goal(s):  Patient will verbalize ability to afford treatment regimen achieve adherence to monitoring guidelines and medication adherence to achieve therapeutic efficacy through collaboration with PharmD and provider.   Interventions: 1:1 collaboration with Dorothyann Peng, NP regarding development and update of comprehensive plan of care as evidenced by provider attestation and co-signature Inter-disciplinary care team collaboration (see longitudinal plan of care) Comprehensive medication review performed; medication list updated in electronic medical record  Hypertension (BP goal <140/90) -Controlled -Current treatment: Amlodipine 10 mg 1 tablet daily - in AM Metoprolol succinate 25 mg 1 tablet daily - in AM -Medications previously tried: none  -Current home readings: 133/68, 136/65, 141/71, 154/68, 134/70, 145/74, 143/74, 132/68, 121/63, 133/63, 129/64 (in office) -Current dietary habits:  uses salt to season -Current exercise habits: does not have any interest -Denies hypotensive/hypertensive symptoms -Educated on Daily salt intake goal < 2300 mg; Exercise goal of 150 minutes per week; Importance of home blood pressure monitoring; Proper BP monitoring technique; -Counseled to monitor BP at home weekly, document, and provide log at future appointments -Counseled on diet and exercise extensively Recommended to continue current medication  Hyperlipidemia: (LDL goal < 100) -Uncontrolled -Current treatment: Atorvastatin 40 mg 1 tablet daily -Medications previously tried: none  -Current dietary patterns: does not eat many fruits and vegetables -Current exercise habits: not interested -Educated on Cholesterol goals;  Benefits of statin for ASCVD risk reduction; Importance of limiting foods high in cholesterol; Exercise goal of 150 minutes per week; -Counseled on diet and exercise extensively Recommended to continue current medication Recommended repeat lipid panel  COPD (Goal: control symptoms and prevent exacerbations) -Not ideally controlled -Current treatment  Breztri 2 puffs twice daily Albuterol HFA as needed Albuterol nebulizer as needed -Medications previously tried: Librarian, academic (cost)  -Gold Grade: n/a -Current COPD Classification:  B (high sx, <2 exacerbations/yr) -MMRC/CAT score: n/a -Pulmonary function testing: none -Exacerbations requiring treatment in last 6 months: none -Patient reports consistent use of maintenance inhaler -Frequency of rescue inhaler use: 2-3 times daily -Counseled on Proper inhaler technique; Benefits of consistent maintenance inhaler use When to use rescue inhaler -Recommended follow up with pulmonary. Assessed patient finances. Filled out patient assistance paperwork for [employer].  Depression/Anxiety (Goal: minimize symptoms) -Uncontrolled -Current treatment: Citalopram 10 mg 1 tablet daily -Medications previously tried/failed:  Bupropion -PHQ9: 0 -GAD7: n/a -Educated on Benefits of medication for symptom control -Recommended to continue current medication Recommended increasing citalopram to 20 mg daily  BPH (Goal:  minimize symptoms) -Uncontrolled -Current treatment  tamsulosin 0.4 mg 2 capsules daily Finasteride 5 mg 1 tablet daily -Medications previously tried: none  -Recommended to follow up with Dr. Tresa [facility] for continued symptoms  Health Maintenance -Vaccine gaps: shingles, COVID, shingles -Current therapy:  Vitamin B12 500 mcg 1 tablet daily Vitamin D 1000 units 1 capsule daily Tylenol as needed -Educated on Cost vs benefit of each product must be carefully weighed by individual consumer -Patient is satisfied with current therapy and denies issues -Recommended to continue current medication  Patient Goals/Self-Care Activities Patient will:  - take medications as prescribed check blood pressure weekly, document, and provide at future appointments target a minimum of 150 minutes of moderate intensity exercise weekly engage in dietary modifications by limiting salt intake  Follow Up Plan: The care management team will reach out to the patient again over the next 30 days.         Patient verbalizes understanding of instructions provided today and agrees to view in [place].  The pharmacy team will reach out to the patient again over the next 30 days.   Viona Gilmore, [facility]

## 2021-03-27 NOTE — Telephone Encounter (Signed)
FYI

## 2021-03-27 NOTE — Progress Notes (Signed)
Chronic Care Management Pharmacy Note  03/27/2021 Name:  Samuel [name] MRN:  [id number] DOB:  October 22, 1950  Summary: BP is at goal < 140/90 per office readings Pt is more irritable    Recommendations/Changes made from today's visit: -Recommend increasing citalopram to 20 mg daily -Requested incontinence supplies -Recommended purchasing a pulse oximeter to check oxygen sat at home   Plan: Apply for PAP for Breztri Follow up GAD7 in 1-2 months   Subjective: Samuel [name] is an 70 y.o. year old male who is a primary patient of Dorothyann Peng, NP.  The CCM team was consulted for assistance with disease management and care coordination needs.    Engaged with patient face to face for follow up visit in response to provider referral for pharmacy case management and/or care coordination services.   Consent to Services:  The patient was given information about Chronic Care Management services, agreed to services, and gave verbal consent prior to initiation of services.  Please see initial visit note for detailed documentation.   Patient Care Team: Dorothyann Peng, NP as PCP - General (Family Medicine) [place], [place] (Nephrology) Viona Gilmore, [facility] as Pharmacist (Pharmacist)  Recent office visits: 11-09-2020 Dorothyann Peng, NP - Patient presented for anxiety. No medication changes.    10-04-2020 - Patient presented for Anxiety. Stopped Bupropion and Prescribed Citalopram.  Recent consult visits: 12/08/20 Harold Barban (urology): Patient presented for bladder chemotherapy. Unable to access notes.  12/05/20 Harrie Jeans (nephrology): Patient presented for CKD follow up. Unable to access notes.  11-08-2020 Freddi Starr, MD (Pulmonology) - Patient presented for Centrilobular emphysema. Prescribed Albuterol Sulfate, and [facility] visits: Admitted to the hospital on 09/15/2020 due to Bladder Biopsy. Discharge date was 09/15/2020. Discharged from  [facility].     New?Medications Started at [facility] Discharge:?? -started the following medications Senna-docusate (Senokot-S) Take 1 tablet by mouth 2 (two) times daily. While taking strong pain meds to prevent constipation Tramadol (Ultram) Take 1-2 tablets (50-100 mg total) by mouth every 6 (six)hours as needed for moderate pain or severe pain. Medication Changes at Hospital Discharge: Cephalexin [facility]) Take 1 capsule (500 mg total) by mouth 2 (two) times daily 3 days. Begin day before next Urology nurse practitioner visit.   Objective:  Lab Results  Component Value Date   CREATININE 2.10 (H) 09/15/2020   BUN 42 (H) 09/15/2020   GFR 38.83 (L) 02/19/2019   GFRNONAA 30 (L) 04/04/2020   GFRAA 35 (L) 04/04/2020   NA 141 09/15/2020   K 4.4 09/15/2020   CALCIUM 10.3 04/04/2020   CO2 27 04/04/2020   GLUCOSE 120 (H) 09/15/2020    Lab Results  Component Value Date/Time   HGBA1C 5.9 (H) 04/04/2020 07:48 AM   HGBA1C 6.2 12/19/2017 07:49 AM   GFR 38.83 (L) 02/19/2019 07:21 AM   GFR 68.60 12/19/2017 07:49 AM    Last diabetic Eye exam: No results found for: HMDIABEYEEXA  Last diabetic Foot exam: No results found for: HMDIABFOOTEX   Lab Results  Component Value Date   CHOL 208 (H) 04/04/2020   HDL 58 04/04/2020   LDLCALC 129 (H) 04/04/2020   TRIG 99 04/04/2020   CHOLHDL 3.6 04/04/2020    Hepatic Function Latest Ref Rng & Units 04/04/2020 02/19/2019 12/19/2017  Total Protein 6.1 - 8.1 g/dL 7.4 7.4 7.0  Albumin 3.5 - 5.2 g/dL - 4.5 4.2  AST 10 - 35 U/L '13 14 20  ' ALT 9 - 46 U/L 15 20 30  Alk Phosphatase 39 - 117 U/L - 138(H) 119(H)  Total Bilirubin 0.2 - 1.2 mg/dL 0.5 0.6 0.5  Bilirubin, Direct 0.0 - 0.3 mg/dL - - 0.1    Lab Results  Component Value Date/Time   TSH 1.89 04/04/2020 07:48 AM   TSH 1.93 02/19/2019 07:21 AM    CBC Latest Ref Rng & Units 09/15/2020 04/04/2020 02/19/2019  WBC 3.8 - 10.8 Thousand/uL - 10.1 10.1  Hemoglobin 13.0 - 17.0 g/dL 13.9  14.4 13.3  Hematocrit 39.0 - 52.0 % 41.0 43.6 40.0  Platelets 140 - 400 Thousand/uL - 437(H) 442.0(H)    Lab Results  Component Value Date/Time   VD25OH 31.48 05/07/2017 01:43 PM    Clinical ASCVD: No  The 10-year ASCVD risk score (Arnett DK, et al., 2019) is: 19.3%   Values used to calculate the score:     Age: 70 years     Sex: Male     Is Non-Hispanic African American: No     Diabetic: No     Tobacco smoker: No     Systolic Blood Pressure: 992 mmHg     Is BP treated: Yes     HDL Cholesterol: 58 mg/dL     Total Cholesterol: 208 mg/dL    Depression screen [facility] 2/9 08/16/2020 03/23/2020 12/19/2017  Decreased Interest 0 0 0  Down, Depressed, Hopeless 0 0 0  PHQ - 2 Score 0 0 0     No flowsheet data found.   No flowsheet data found.    Social History   Tobacco Use  Smoking Status Former   Years: 58.00   Types: Cigarettes   Quit date: 01/16/2017   Years since quitting: 4.1  Smokeless Tobacco Never   BP Readings from Last 3 Encounters:  11/09/20 120/68  11/08/20 118/74  10/04/20 100/68   Pulse Readings from Last 3 Encounters:  11/09/20 87  11/08/20 74  10/04/20 89   Wt Readings from Last 3 Encounters:  11/09/20 154 lb (69.9 kg)  11/08/20 153 lb 3.2 oz (69.5 kg)  10/04/20 149 lb (67.6 kg)   BMI Readings from Last 3 Encounters:  11/09/20 27.28 kg/m  11/08/20 27.14 kg/m  10/04/20 25.58 kg/m    Assessment/Interventions: Review of patient past medical history, allergies, medications, health status, including review of consultants reports, laboratory and other test data, was performed as part of comprehensive evaluation and provision of chronic care management services.   SDOH:  (Social Determinants of Health) assessments and interventions performed: Yes   SDOH Screenings   Alcohol Screen: Low Risk    Last Alcohol Screening Score (AUDIT): 0  Depression (PHQ2-9): Low Risk    PHQ-2 Score: 0  Financial Resource Strain: Medium Risk   Difficulty of Paying  Living Expenses: Somewhat hard  Food Insecurity: No Food Insecurity   Worried About Charity fundraiser in the Last Year: Never true   Ran Out of Food in the Last Year: Never true  Housing: Low Risk    Last Housing Risk Score: 0  Physical Activity: Inactive   Days of Exercise per Week: 0 days   Minutes of Exercise per Session: 0 min  Social Connections: Moderately Isolated   Frequency of Communication with Friends and Family: More than three times a week   Frequency of Social Gatherings with Friends and Family: More than three times a week   Attends Religious Services: Never   Marine scientist or Organizations: No   Attends Archivist Meetings: Never   Marital  Status: Married  Stress: No Stress Concern Present   Feeling of Stress : Not at all  Tobacco Use: Medium Risk   Smoking Tobacco Use: Former   Smokeless Tobacco Use: Never   Passive Exposure: Not on file  Transportation Needs: No Transportation Needs   Lack of Transportation (Medical): No   Lack of Transportation (Non-Medical): No    CCM Care Plan  Allergies  Allergen Reactions   Penicillins Nausea And Vomiting    Medications Reviewed Today     Reviewed by Viona Gilmore, [facility] (Pharmacist) on 03/27/21 at 0926  Med List Status: <None>   Medication Order Taking? Sig Documenting Provider Last Dose Status Informant  acetaminophen (TYLENOL) 500 MG tablet [id number]  Take 500 mg by mouth every 6 (six) hours as needed. [provider]  Active   albuterol (PROVENTIL) (2.5 MG/3ML) 0.083% nebulizer solution [id number] Yes Take 3 mLs (2.5 mg total) by nebulization every 6 (six) hours as needed for wheezing or shortness of breath. Freddi Starr, MD Taking Active   albuterol (VENTOLIN HFA) 108 (90 Base) MCG/ACT inhaler [id number]  Inhale 2 puffs into the lungs every 6 (six) hours as needed for wheezing or shortness of breath. Freddi Starr, MD  Active   amLODipine (NORVASC) 10 MG tablet [id number]  Yes Take 10 mg by mouth daily. [provider] Taking Active Self  atorvastatin (LIPITOR) 40 MG tablet [id number] Yes Take 1 tablet (40 mg total) by mouth daily. Take 1 tab daily [name], [name], NP Taking Active   Budeson-Glycopyrrol-Formoterol (BREZTRI AEROSPHERE) 160-9-4.8 MCG/ACT AERO [id number] Yes Use as directed.  DGL#[id number] Exp 8/23  4 boxes  Patient taking differently: Use as directed.  JOA#[id number] Exp 8/23  4 boxes   [name], [name], NP Taking Active   Budeson-Glycopyrrol-Formoterol (BREZTRI AEROSPHERE) 160-9-4.8 MCG/ACT AERO [id number]  Inhale 2 puffs into the lungs in the morning and at bedtime. Freddi Starr, MD  Active   Cholecalciferol (VITAMIN D3) 25 MCG (1000 UT) CAPS [id number] Yes Take 1 capsule by mouth daily. [provider] Taking Active Self  citalopram (CELEXA) 10 MG tablet [id number] Yes TAKE 1 TABLET(10 MG) BY MOUTH DAILY [name], [name], NP Taking Active   finasteride (PROSCAR) 5 MG tablet [id number] Yes Take 5 mg by mouth daily. Alexis Frock, MD Taking Active Self  metoprolol succinate (TOPROL-XL) 25 MG 24 hr tablet [id number] Yes Take 25 mg by mouth daily. [provider] Taking Active Self  tamsulosin (FLOMAX) 0.4 MG CAPS capsule [id number] Yes Take 2 capsules by mouth at bedtime. [provider] Taking Active Self  vitamin B-12 (CYANOCOBALAMIN) 500 MCG tablet [id number] Yes Take 1,000 mcg by mouth daily. [provider] Taking Active             Patient Active Problem List   Diagnosis Date Noted   Hyperlipidemia 12/19/2017   Depression    Essential hypertension 11/15/2016   Tobacco use 11/15/2016   BPH without urinary obstruction 11/15/2016    Immunization History  Administered Date(s) Administered   Influenza, High Dose Seasonal PF 02/27/2017   Pneumococcal Conjugate-13 11/15/2016   Pneumococcal Polysaccharide-23 12/19/2017   Patient reported his biggest concern right now is that he feels more short  winded lately. He is using his albuterol inhaler and nebulizer more often.   He also had an episode over the weekend in the bathroom in which he came out shaking and lightheaded and short winded and needed to sit down for a while.   Patient reports he is also  not sleeping well because he is having constant accidents while he is sleeping. He is going to start using depends on something similar because of the frequency of accidents.   Conditions to be addressed/monitored:  Hypertension, Hyperlipidemia, COPD, Depression and BPH  Conditions addressed this visit: COPD, hypertension, irritability/depression  Care Plan : CCM Pharmacy Care Plan  Updates made by Viona Gilmore, [place] since 03/27/2021 12:00 AM     Problem: Problem: Hypertension, Hyperlipidemia, COPD, Depression, and BPH      Long-Range Goal: Patient-Specific Goal   Start Date: 09/28/2020  Expected End Date: 09/28/2021  Recent Progress: On track  Priority: High  Note:   Current Barriers:  Unable to independently afford treatment regimen Unable to independently monitor therapeutic efficacy  Pharmacist Clinical Goal(s):  Patient will verbalize ability to afford treatment regimen achieve adherence to monitoring guidelines and medication adherence to achieve therapeutic efficacy through collaboration with PharmD and provider.   Interventions: 1:1 collaboration with Dorothyann Peng, NP regarding development and update of comprehensive plan of care as evidenced by provider attestation and co-signature Inter-disciplinary care team collaboration (see longitudinal plan of care) Comprehensive medication review performed; medication list updated in electronic medical record  Hypertension (BP goal <140/90) -Controlled -Current treatment: Amlodipine 10 mg 1 tablet daily - in AM Metoprolol succinate 25 mg 1 tablet daily - in AM -Medications previously tried: none  -Current home readings: 133/68, 136/65, 141/71, 154/68, 134/70,  145/74, 143/74, 132/68, 121/63, 133/63, 129/64 (in office) -Current dietary habits: uses salt to season -Current exercise habits: does not have any interest -Denies hypotensive/hypertensive symptoms -Educated on Daily salt intake goal < 2300 mg; Exercise goal of 150 minutes per week; Importance of home blood pressure monitoring; Proper BP monitoring technique; -Counseled to monitor BP at home weekly, document, and provide log at future appointments -Counseled on diet and exercise extensively Recommended to continue current medication  Hyperlipidemia: (LDL goal < 100) -Uncontrolled -Current treatment: Atorvastatin 40 mg 1 tablet daily -Medications previously tried: none  -Current dietary patterns: does not eat many fruits and vegetables -Current exercise habits: not interested -Educated on Cholesterol goals;  Benefits of statin for ASCVD risk reduction; Importance of limiting foods high in cholesterol; Exercise goal of 150 minutes per week; -Counseled on diet and exercise extensively Recommended to continue current medication Recommended repeat lipid panel  COPD (Goal: control symptoms and prevent exacerbations) -Not ideally controlled -Current treatment  Breztri 2 puffs twice daily Albuterol HFA as needed Albuterol nebulizer as needed -Medications previously tried: Librarian, academic (cost)  -Gold Grade: n/a -Current COPD Classification:  B (high sx, <2 exacerbations/yr) -MMRC/CAT score: n/a -Pulmonary function testing: none -Exacerbations requiring treatment in last 6 months: none -Patient reports consistent use of maintenance inhaler -Frequency of rescue inhaler use: 2-3 times daily -Counseled on Proper inhaler technique; Benefits of consistent maintenance inhaler use When to use rescue inhaler -Recommended follow up with pulmonary. Assessed patient finances. Filled out patient assistance paperwork for [employer].  Depression/Anxiety (Goal: minimize  symptoms) -Uncontrolled -Current treatment: Citalopram 10 mg 1 tablet daily -Medications previously tried/failed: Bupropion -PHQ9: 0 -GAD7: n/a -Educated on Benefits of medication for symptom control -Recommended to continue current medication Recommended increasing citalopram to 20 mg daily  BPH (Goal: minimize symptoms) -Uncontrolled -Current treatment  tamsulosin 0.4 mg 2 capsules daily Finasteride 5 mg 1 tablet daily -Medications previously tried: none  -Recommended to follow up with Dr. Tresa Moore for continued symptoms  Health Maintenance -Vaccine gaps: shingles, COVID, shingles -Current therapy:  Vitamin B12 500 mcg 1 tablet  daily Vitamin D 1000 units 1 capsule daily Tylenol as needed -Educated on Cost vs benefit of each product must be carefully weighed by individual consumer -Patient is satisfied with current therapy and denies issues -Recommended to continue current medication  Patient Goals/Self-Care Activities Patient will:  - take medications as prescribed check blood pressure weekly, document, and provide at future appointments target a minimum of 150 minutes of moderate intensity exercise weekly engage in dietary modifications by limiting salt intake  Follow Up Plan: The care management team will reach out to the patient again over the next 30 days.          Medication Assistance:  Breztri obtained through [place] medication assistance program.  Enrollment ends 05/12/21  Compliance/Adherence/Medication fill history: Care Gaps: Shingrix, COVID booster, influenza BP- 120/68 (11/09/20)   Star-Rating Drugs: Atorvastatin (Lipitor) 40 mg - Last filled 02/19/2021 45 DS at [facility]  Patient's preferred pharmacy is:  [facility] [place], [place] - [address] [place] [address] Phone: [phone] Fax: [phone]  Uses pill box? Yes - weekly (wife  supplements) Pt endorses 99% compliance   We discussed: Current pharmacy is preferred with insurance plan and patient is satisfied with pharmacy services Patient decided to: Continue current medication management strategy  Care Plan and Follow Up Patient Decision:  Patient agrees to Care Plan and Follow-up.  Plan: The care management team will reach out to the patient again over the next 30 days.  [name], PharmD [facility] Clinical Pharmacist [place] [facility] [place]

## 2021-03-28 ENCOUNTER — Telehealth: Payer: Self-pay | Admitting: Pharmacist

## 2021-03-28 NOTE — Telephone Encounter (Signed)
Called patient to follow up on changes from CCM visit yesterday. Left a voicemail requesting a call back.

## 2021-03-28 NOTE — Telephone Encounter (Signed)
JD please advise. Thanks  This is Samuel [name]'s wife, Samuel [name].   To bring you up to date, [name] had an episode recently, where he got really hot and shaky and short of breath and felt like he was going to pass out.  After sitting for a while, he was okay.  An hour ago, the same thing happened.  To rule out a few things, I took his blood pressure and it was 131/66 with his pulse at 102.  I then checked his sugar and that was at 123.  He is now lying down with oxygen.  I'm not sure what to do next.  Thank you, Samuel [name]

## 2021-03-28 NOTE — Telephone Encounter (Signed)
Patient called back. Made him aware of the dose increase for citalopram and that he can take 2 of the 10 mg tablets until he runs out. He is also aware to schedule a follow up with his PCP in 4-6 weeks to see how the medication is working.  Let patient know he was approved for [facility] [employer]Me application and will be getting a telephone call from them scheduling a delivery in the next few weeks.

## 2021-03-29 ENCOUNTER — Inpatient Hospital Stay (HOSPITAL_COMMUNITY)
Admission: EM | Admit: 2021-03-29 | Discharge: 2021-04-03 | DRG: 683 | Disposition: A | Discharge status: Home Health | Payer: Medicare Other | Attending: Internal Medicine | Admitting: Internal Medicine

## 2021-03-29 ENCOUNTER — Telehealth: Payer: Self-pay | Admitting: Internal Medicine

## 2021-03-29 ENCOUNTER — Ambulatory Visit (INDEPENDENT_AMBULATORY_CARE_PROVIDER_SITE_OTHER): Payer: Medicare Other

## 2021-03-29 ENCOUNTER — Encounter (HOSPITAL_COMMUNITY): Payer: Self-pay | Admitting: Emergency Medicine

## 2021-03-29 ENCOUNTER — Telehealth (HOSPITAL_COMMUNITY): Payer: Self-pay

## 2021-03-29 ENCOUNTER — Ambulatory Visit: Payer: Medicare Other | Admitting: Nurse Practitioner

## 2021-03-29 ENCOUNTER — Other Ambulatory Visit: Payer: Self-pay

## 2021-03-29 ENCOUNTER — Encounter: Payer: Self-pay | Admitting: Nurse Practitioner

## 2021-03-29 VITALS — BP 128/68 | HR 88 | Temp 98.2°F | Ht 63.0 in | Wt 149.6 lb

## 2021-03-29 DIAGNOSIS — M25439 Effusion, unspecified wrist: Secondary | ICD-10-CM

## 2021-03-29 DIAGNOSIS — F32A Depression, unspecified: Secondary | ICD-10-CM | POA: Diagnosis present

## 2021-03-29 DIAGNOSIS — J449 Chronic obstructive pulmonary disease, unspecified: Secondary | ICD-10-CM | POA: Insufficient documentation

## 2021-03-29 DIAGNOSIS — N1832 Acute kidney failure, unspecified: Secondary | ICD-10-CM | POA: Diagnosis present

## 2021-03-29 DIAGNOSIS — F419 Anxiety disorder, unspecified: Secondary | ICD-10-CM | POA: Diagnosis present

## 2021-03-29 DIAGNOSIS — I129 Hypertensive chronic kidney disease with stage 1 through stage 4 chronic kidney disease, or unspecified chronic kidney disease: Secondary | ICD-10-CM | POA: Diagnosis present

## 2021-03-29 DIAGNOSIS — C679 Malignant neoplasm of bladder, unspecified: Secondary | ICD-10-CM | POA: Diagnosis present

## 2021-03-29 DIAGNOSIS — J432 Centrilobular emphysema: Secondary | ICD-10-CM

## 2021-03-29 DIAGNOSIS — B964 Proteus (mirabilis) (morganii) as the cause of diseases classified elsewhere: Secondary | ICD-10-CM | POA: Diagnosis present

## 2021-03-29 DIAGNOSIS — Z66 Do not resuscitate: Secondary | ICD-10-CM | POA: Diagnosis present

## 2021-03-29 DIAGNOSIS — J439 Emphysema, unspecified: Secondary | ICD-10-CM | POA: Diagnosis present

## 2021-03-29 DIAGNOSIS — G4736 Sleep related hypoventilation in conditions classified elsewhere: Secondary | ICD-10-CM

## 2021-03-29 DIAGNOSIS — M7989 Other specified soft tissue disorders: Secondary | ICD-10-CM

## 2021-03-29 DIAGNOSIS — Z823 Family history of stroke: Secondary | ICD-10-CM

## 2021-03-29 DIAGNOSIS — D631 Anemia in chronic kidney disease: Secondary | ICD-10-CM | POA: Diagnosis present

## 2021-03-29 DIAGNOSIS — N179 Acute kidney failure, unspecified: Secondary | ICD-10-CM | POA: Diagnosis not present

## 2021-03-29 DIAGNOSIS — Z803 Family history of malignant neoplasm of breast: Secondary | ICD-10-CM

## 2021-03-29 DIAGNOSIS — I10 Essential (primary) hypertension: Secondary | ICD-10-CM | POA: Diagnosis present

## 2021-03-29 DIAGNOSIS — Z9981 Dependence on supplemental oxygen: Secondary | ICD-10-CM

## 2021-03-29 DIAGNOSIS — E872 Acidosis, unspecified: Secondary | ICD-10-CM | POA: Diagnosis present

## 2021-03-29 DIAGNOSIS — J9611 Chronic respiratory failure with hypoxia: Secondary | ICD-10-CM

## 2021-03-29 DIAGNOSIS — E871 Hypo-osmolality and hyponatremia: Secondary | ICD-10-CM | POA: Diagnosis present

## 2021-03-29 DIAGNOSIS — R42 Dizziness and giddiness: Secondary | ICD-10-CM

## 2021-03-29 DIAGNOSIS — R0602 Shortness of breath: Secondary | ICD-10-CM | POA: Diagnosis not present

## 2021-03-29 DIAGNOSIS — E785 Hyperlipidemia, unspecified: Secondary | ICD-10-CM | POA: Diagnosis present

## 2021-03-29 DIAGNOSIS — N390 Urinary tract infection, site not specified: Secondary | ICD-10-CM | POA: Diagnosis present

## 2021-03-29 DIAGNOSIS — N136 Pyonephrosis: Secondary | ICD-10-CM | POA: Diagnosis present

## 2021-03-29 DIAGNOSIS — Z8042 Family history of malignant neoplasm of prostate: Secondary | ICD-10-CM

## 2021-03-29 DIAGNOSIS — Z20822 Contact with and (suspected) exposure to covid-19: Secondary | ICD-10-CM | POA: Diagnosis present

## 2021-03-29 DIAGNOSIS — Z8249 Family history of ischemic heart disease and other diseases of the circulatory system: Secondary | ICD-10-CM

## 2021-03-29 DIAGNOSIS — N139 Obstructive and reflux uropathy, unspecified: Secondary | ICD-10-CM | POA: Diagnosis present

## 2021-03-29 DIAGNOSIS — Z86010 Personal history of colonic polyps: Secondary | ICD-10-CM

## 2021-03-29 DIAGNOSIS — M19031 Primary osteoarthritis, right wrist: Secondary | ICD-10-CM | POA: Diagnosis present

## 2021-03-29 DIAGNOSIS — R319 Hematuria, unspecified: Secondary | ICD-10-CM

## 2021-03-29 DIAGNOSIS — R32 Unspecified urinary incontinence: Secondary | ICD-10-CM | POA: Diagnosis present

## 2021-03-29 DIAGNOSIS — Z87891 Personal history of nicotine dependence: Secondary | ICD-10-CM

## 2021-03-29 DIAGNOSIS — R310 Gross hematuria: Secondary | ICD-10-CM | POA: Diagnosis present

## 2021-03-29 LAB — CBC WITH DIFFERENTIAL/PLATELET
Abs Immature Granulocytes: 0.03 10*3/uL (ref 0.00–0.07)
Basophils Absolute: 0 10*3/uL (ref 0.0–0.1)
Basophils Absolute: 0 10*3/uL (ref 0.0–0.1)
Basophils Relative: 0.4 % (ref 0.0–3.0)
Basophils Relative: 0 %
Eosinophils Absolute: 0.3 10*3/uL (ref 0.0–0.7)
Eosinophils Absolute: 0.4 10*3/uL (ref 0.0–0.5)
Eosinophils Relative: 3.2 % (ref 0.0–5.0)
Eosinophils Relative: 4 %
HCT: 30.7 % — ABNORMAL LOW (ref 39.0–52.0)
HCT: 30.3 % — ABNORMAL LOW (ref 39.0–52.0)
Hemoglobin: 10.3 g/dL — ABNORMAL LOW (ref 13.0–17.0)
Hemoglobin: 9.7 g/dL — ABNORMAL LOW (ref 13.0–17.0)
Immature Granulocytes: 0 %
Lymphocytes Relative: 7.6 % — ABNORMAL LOW (ref 12.0–46.0)
Lymphocytes Relative: 7 %
Lymphs Abs: 0.7 10*3/uL (ref 0.7–4.0)
Lymphs Abs: 0.8 10*3/uL (ref 0.7–4.0)
MCH: 26.4 pg (ref 26.0–34.0)
MCHC: 33.4 g/dL (ref 30.0–36.0)
MCHC: 32 g/dL (ref 30.0–36.0)
MCV: 80.2 fl (ref 78.0–100.0)
MCV: 82.3 fL (ref 80.0–100.0)
Monocytes Absolute: 0.8 10*3/uL (ref 0.1–1.0)
Monocytes Absolute: 1 10*3/uL (ref 0.1–1.0)
Monocytes Relative: 9 % (ref 3.0–12.0)
Monocytes Relative: 9 %
Neutro Abs: 7.4 10*3/uL (ref 1.4–7.7)
Neutro Abs: 8.3 10*3/uL — ABNORMAL HIGH (ref 1.7–7.7)
Neutrophils Relative %: 79.8 % — ABNORMAL HIGH (ref 43.0–77.0)
Neutrophils Relative %: 80 %
Platelets: 444 10*3/uL — ABNORMAL HIGH (ref 150.0–400.0)
Platelets: 408 10*3/uL — ABNORMAL HIGH (ref 150–400)
RBC: 3.83 Mil/uL — ABNORMAL LOW (ref 4.22–5.81)
RBC: 3.68 MIL/uL — ABNORMAL LOW (ref 4.22–5.81)
RDW: 18.1 % — ABNORMAL HIGH (ref 11.5–15.5)
RDW: 17.2 % — ABNORMAL HIGH (ref 11.5–15.5)
WBC: 9.2 10*3/uL (ref 4.0–10.5)
WBC: 10.4 10*3/uL (ref 4.0–10.5)
nRBC: 0 % (ref 0.0–0.2)

## 2021-03-29 LAB — URINALYSIS, ROUTINE W REFLEX MICROSCOPIC
Bilirubin Urine: NEGATIVE
Glucose, UA: 50 mg/dL — AB
Ketones, ur: NEGATIVE mg/dL
Nitrite: NEGATIVE
Protein, ur: 30 mg/dL — AB
Specific Gravity, Urine: 1.009 (ref 1.005–1.030)
WBC, UA: >50 WBC/hpf — ABNORMAL HIGH (ref 0–5)
pH: 6 (ref 5.0–8.0)

## 2021-03-29 LAB — BASIC METABOLIC PANEL
Anion gap: 14 (ref 5–15)
BUN: 90 mg/dL (ref 6–23)
BUN: 95 mg/dL — ABNORMAL HIGH (ref 8–23)
CO2: 19 mEq/L (ref 19–32)
CO2: 14 mmol/L — ABNORMAL LOW (ref 22–32)
Calcium: 9.5 mg/dL (ref 8.4–10.5)
Calcium: 8.8 mg/dL — ABNORMAL LOW (ref 8.9–10.3)
Chloride: 105 mEq/L (ref 96–112)
Chloride: 106 mmol/L (ref 98–111)
Creatinine, Ser: 5.9 mg/dL (ref 0.40–1.50)
Creatinine, Ser: 6.06 mg/dL — ABNORMAL HIGH (ref 0.61–1.24)
GFR, Estimated: 9 mL/min — ABNORMAL LOW (ref >60)
GFR: 9.02 mL/min — CL (ref >60.00)
Glucose, Bld: 97 mg/dL (ref 70–99)
Glucose, Bld: 110 mg/dL — ABNORMAL HIGH (ref 70–99)
Potassium: 5.1 mEq/L (ref 3.5–5.1)
Potassium: 4.8 mmol/L (ref 3.5–5.1)
Sodium: 137 mEq/L (ref 135–145)
Sodium: 134 mmol/L — ABNORMAL LOW (ref 135–145)

## 2021-03-29 LAB — BRAIN NATRIURETIC PEPTIDE: Pro B Natriuretic peptide (BNP): 61 pg/mL (ref 0.0–100.0)

## 2021-03-29 LAB — D-DIMER, QUANTITATIVE: D-Dimer, Quant: 0.56 mcg/mL FEU — ABNORMAL HIGH (ref <0.50)

## 2021-03-29 NOTE — Progress Notes (Addendum)
@Patient  ID: Samuel [name], male    DOB: 1951-05-09, 70 y.o.   MRN: [id number]  Chief Complaint  Patient presents with   Follow-up    SOB increased over last weekend     Referring provider: Dorothyann Peng, NP  HPI: 70 year old male, former smoker. PMH significant for centrilobular emphysema, transient noctural desaturation on nighttime O2 therapy, CKD and HTN. Patient of Dr. Erin Fulling, seen on 11/08/20 for consult COPD. Maintained on [employer] and prn Albuterol. Referred to Pulmonary rehab.  Tests/Events: 06/08/2020 Chest xray 2 view: COPD hyperinflation of the lungs.  Heart and mediastinal contours are within normal limits.  No focal opacities or effusions 09/15/2020 EKG: Normal sinus rhythm with normal PR interval and QTc. 09/27/2020 CT chest lung ca screen: Centrilobular and paraseptal emphysema evident.  Tiny left apical nodule identified with volume derived equivalent diameter of 3.4 mm.  Several additional tiny pulmonary nodules noted, but no overtly suspicious pulmonary nodule or mass.  No focal airspace consolidation.  No pleural effusion. Coronary artery calcification is evident.  Atherosclerotic calcification is noted in the wall of the thoracic aorta.  Lung-RADS 2, benign appearance or behavior continue annual screening with low-dose chest CT.  Emphysema and aortic arthrosclerosis. 11/15/2020: Overnight pulse oximetry.  Showed patient spent 46.6 minutes less than 88%.  Lowest SPO2 was 82%.  Showed 128 ODI events with an average of 13 events per hour.   11/08/20- OV with Dr. Erin Fulling. Limited physical activity d/t limitations from SOB.  Lives a fairly sedentary lifestyle.  On Breztri 2 puffs twice daily.  Previously on Trelegy and transitioned in January 2022 with notable improvement in his wheezing.  Reported increased frequency of wheezing in the evening when going to bed.  Previous lung cancer screening CT chest 09/27/2020 with findings of diffuse centrilobular emphysema,  tiny left apical nodule, several additional tiny pulmonary nodules noted, but no overtly suspicious pulmonary nodule or mass.  PFTs ordered as well as overnight oximetry test.  Nebulizer machine and albuterol nebulizer solution ordered.  Referred to pulmonary rehab.  Follow-up in 3 months.  01/10/2021: Telephone encounter to discuss ONO results. Pt notified he qualifies for supplemental oxygen at night when sleeping based on his overnight oximetry testing.  He was encouraged to do an in lab sleep study for further assessment of sleep disordered breathing for possible sleep apnea due to increased amounts of events per hour and his oxygen desaturations.  Patient declined in lab sleep study but agreed to overnight oxygen use.  03/29/2021- Today - acute sick visit Patient presents today with wife for reports of persistent shortness of breath, mostly upon exertion, over the past few months. He denies worsening of his symptoms but has not had any improvement since he was seen in June. He cannot walk more than a few steps without becoming short of breath and requiring to rest or use his rescue device. This has resulted in a relatively sedentary lifestyle. His wife reports episodes of wheezing, which are relieved with rescue inhaler or nebulizer. He denies cough, hemoptysis, fever or chills. He continues to wear  his oxygen at night, which he feels helps. He does have fatigue symptoms but denies narcolepsy or drowsy driving. He was supposed to being pulmonary rehab but reports they never contacted him to schedule this.   Two days ago, he had a reported pre-syncopal episode of "feeling like he is going to pass out" and "being really hot and shaky". He did not lose consciousness or fall at  the time. No association to postural change noted. His wife checked his blood pressure, heart rate, and sugar, which were all stable. He has not experienced this since. He denies any chest pain, leg swelling, palpitations, orthopnea,  PND. He has never been evaluated by cardiology for potential underlying cardiac etiology. Overall, he feels better today than when experiencing the previously mentioned episode but his breathing status is unchanged and not improving.    Allergies  Allergen Reactions   Penicillins Nausea And Vomiting    Immunization History  Administered Date(s) Administered   Influenza, High Dose Seasonal PF 02/27/2017   Pneumococcal Conjugate-13 11/15/2016   Pneumococcal Polysaccharide-23 12/19/2017    Past Medical History:  Diagnosis Date   Anxiety    Bladder cancer [facility])    urologist--- dr Tresa Moore   BPH with obstruction/lower urinary tract symptoms    CKD (chronic kidney disease), stage III ([place])    Congenital deformity of left hand    COPD (chronic obstructive pulmonary disease) ([place])    followed by pcp   Depression    DOE (dyspnea on exertion)    Essential hypertension    followed by pcp   Feeling of incomplete bladder emptying    Hematuria    History of adenomatous polyp of colon    Hyperlipidemia    Renal cysts, acquired, bilateral    Wears glasses     Tobacco History: Social History   Tobacco Use  Smoking Status Former   Years: 58.00   Types: Cigarettes   Quit date: 01/16/2017   Years since quitting: 4.2  Smokeless Tobacco Never   Counseling given: Not Answered   Outpatient Medications Prior to Visit  Medication Sig Dispense Refill   albuterol (VENTOLIN HFA) 108 (90 Base) MCG/ACT inhaler Inhale 2 puffs into the lungs every 6 (six) hours as needed for wheezing or shortness of breath. 8 g 6   amLODipine (NORVASC) 10 MG tablet Take 10 mg by mouth daily.     atorvastatin (LIPITOR) 40 MG tablet Take 1 tablet (40 mg total) by mouth daily. Take 1 tab daily 90 tablet 0   Budeson-Glycopyrrol-Formoterol (BREZTRI AEROSPHERE) 160-9-4.8 MCG/ACT AERO Inhale 2 puffs into the lungs in the morning and at bedtime. 11.8 g 11   citalopram (CELEXA) 20 MG tablet Take 1 tablet (20 mg total) by  mouth daily. 90 tablet 1   finasteride (PROSCAR) 5 MG tablet Take 5 mg by mouth daily.     metoprolol succinate (TOPROL-XL) 25 MG 24 hr tablet Take 25 mg by mouth daily.     tamsulosin (FLOMAX) 0.4 MG CAPS capsule Take 2 capsules by mouth at bedtime.     vitamin B-12 (CYANOCOBALAMIN) 500 MCG tablet Take 1,000 mcg by mouth daily.     acetaminophen (TYLENOL) 500 MG tablet Take 500 mg by mouth every 6 (six) hours as needed. (Patient not taking: Reported on 03/29/2021)     albuterol (PROVENTIL) (2.5 MG/3ML) 0.083% nebulizer solution Take 3 mLs (2.5 mg total) by nebulization every 6 (six) hours as needed for wheezing or shortness of breath. (Patient not taking: Reported on 03/29/2021) 1080 mL 1   Budeson-Glycopyrrol-Formoterol (BREZTRI AEROSPHERE) 160-9-4.8 MCG/ACT AERO Use as directed.  VCB#[id number] c00 Exp 1/25  4 boxes (Patient not taking: Reported on 03/29/2021) 5.9 g 0   Cholecalciferol (VITAMIN D3) 25 MCG (1000 UT) CAPS Take 1 capsule by mouth daily. (Patient not taking: Reported on 03/29/2021)     No facility-administered medications prior to visit.      Review of Systems  Review of Systems  Constitutional:  Positive for fatigue. Negative for diaphoresis, fever and unexpected weight change.  HENT: Negative.    Eyes: Negative.  Negative for visual disturbance.  Respiratory:  Positive for shortness of breath and wheezing. Negative for cough and chest tightness.   Cardiovascular: Negative.  Negative for chest pain, palpitations and leg swelling.  Gastrointestinal: Negative.   Genitourinary: Negative.  Negative for flank pain, frequency and hematuria.  Musculoskeletal: Negative.  Negative for joint swelling.  Skin: Negative.   Neurological:  Positive for light-headedness (two days ago; resolved now). Negative for syncope (pre-syncopal event two days ago).  Psychiatric/Behavioral: Negative.      Physical Exam  BP 128/68 (BP Location: Right Arm, Patient Position: Sitting, Cuff Size:  Normal)   Pulse 88   Temp 98.2 F (36.8 C) (Oral)   Ht 5\' 3"  (1.6 m)   Wt 149 lb 9.6 oz (67.9 kg)   SpO2 98%   BMI 26.50 kg/m  Physical Exam Constitutional:      General: He is not in acute distress.    Appearance: Normal appearance. He is normal weight. He is not toxic-appearing.  HENT:     Head: Normocephalic and atraumatic.     Nose: Nose normal.     Mouth/Throat:     Mouth: Mucous membranes are moist.     Pharynx: Oropharynx is clear.  Eyes:     Extraocular Movements: Extraocular movements intact.     Conjunctiva/sclera: Conjunctivae normal.     Pupils: Pupils are equal, round, and reactive to light.  Cardiovascular:     Rate and Rhythm: Normal rate and regular rhythm.     Pulses: Normal pulses.     Heart sounds: Normal heart sounds. No murmur heard.   No friction rub. No gallop.  Pulmonary:     Effort: Pulmonary effort is normal. No accessory muscle usage or respiratory distress.     Breath sounds: Decreased breath sounds (bilaterally) present. No wheezing, rhonchi or rales.  Abdominal:     General: Abdomen is flat. Bowel sounds are normal.     Palpations: Abdomen is soft.  Musculoskeletal:        General: No swelling. Normal range of motion.     Cervical back: Normal range of motion and neck supple.     Right lower leg: No edema.     Left lower leg: No edema.  Skin:    General: Skin is warm and dry.     Capillary Refill: Capillary refill takes less than 2 seconds.  Neurological:     General: No focal deficit present.     Mental Status: He is alert and oriented to person, place, and time. Mental status is at baseline.     Gait: Gait normal.  Psychiatric:        Mood and Affect: Mood normal.        Behavior: Behavior normal.        Thought Content: Thought content normal.        Judgment: Judgment normal.     Lab Results:  CBC    Component Value Date/Time   WBC 10.1 04/04/2020 0748   RBC 5.01 04/04/2020 0748   HGB 13.9 09/15/2020 0659   HCT 41.0  09/15/2020 0659   PLT 437 (H) 04/04/2020 0748   MCV 87.0 04/04/2020 0748   MCH 28.7 04/04/2020 0748   MCHC 33.0 04/04/2020 0748   RDW 13.4 04/04/2020 0748   LYMPHSABS 1,364 04/04/2020 0748   MONOABS 0.9 02/19/2019 0721  EOSABS 263 04/04/2020 0748   BASOSABS 61 04/04/2020 0748    BMET    Component Value Date/Time   NA 141 09/15/2020 0659   K 4.4 09/15/2020 0659   CL 107 09/15/2020 0659   CO2 27 04/04/2020 0748   GLUCOSE 120 (H) 09/15/2020 0659   BUN 42 (H) 09/15/2020 0659   CREATININE 2.10 (H) 09/15/2020 0659   CREATININE 2.14 (H) 04/04/2020 0748   CALCIUM 10.3 04/04/2020 0748   GFRNONAA 30 (L) 04/04/2020 0748   GFRAA 35 (L) 04/04/2020 0748    BNP No results found for: BNP  ProBNP No results found for: PROBNP  Imaging: No results found.   Assessment & Plan:   COPD (chronic obstructive pulmonary disease) ([place]) Continues to experience high symptom burden. PFTs previously ordered but never completed. Reordered today.   Patient Instructions  Continue Breztri 2 puffs Twice daily, rinse after Continue albuterol inhaler 2 puffs or nebulizer 3 mL every 6 hours as needed for wheezing or shortness of breath Continue oxygen at night at 2L/min Labs obtained today to assess for any other possible underlying illness/disease. We will notify you of results. Walking oximetry today with low of 92% on RA  Referral to cardiology for persistent shortness of breath despite treatment and recent pre-syncopal episode  Pulmonary rehab referral placed. Follow up if you do not hear back from them for scheduling.  Your overnight oximetry study showed that you are having multiple episodes of oxygen saturation at night. This is an indication for evaluation of sleep apnea. Split night in-lab sleep study discussed today and referral placed.   Chest x ray today to assess for possible underlying illness/infection. We will notify you of your results.  If you develop worsening shortness of  breath, fever, chills, chest pain, or swelling in your legs, notify immediately and/or seek emergency care.   Follow up in one month with Dr. Erin Fulling or APP. If symptoms do not improve or worsen, please contact office for sooner follow up or seek emergency care.    Nocturnal hypoxemia due to emphysema [facility]) See above plan.  Shortness of breath on exertion See above plan.  Lightheadedness See above plan     Clayton Bibles, NP 03/29/2021

## 2021-03-29 NOTE — Assessment & Plan Note (Signed)
Continues to experience high symptom burden. PFTs previously ordered but never completed. Reordered today.   Patient Instructions  Continue Breztri 2 puffs Twice daily, rinse after Continue albuterol inhaler 2 puffs or nebulizer 3 mL every 6 hours as needed for wheezing or shortness of breath Continue oxygen at night at 2L/min Labs obtained today to assess for any other possible underlying illness/disease. We will notify you of results. Walking oximetry today with low of 92% on RA  Referral to cardiology for persistent shortness of breath despite treatment and recent pre-syncopal episode  Pulmonary rehab referral placed. Follow up if you do not hear back from them for scheduling.  Your overnight oximetry study showed that you are having multiple episodes of oxygen saturation at night. This is an indication for evaluation of sleep apnea. Split night in-lab sleep study discussed today and referral placed.   Chest x ray today to assess for possible underlying illness/infection. We will notify you of your results.  If you develop worsening shortness of breath, fever, chills, chest pain, or swelling in your legs, notify immediately and/or seek emergency care.   Follow up in one month with Dr. Erin Fulling or APP. If symptoms do not improve or worsen, please contact office for sooner follow up or seek emergency care.

## 2021-03-29 NOTE — ED Triage Notes (Signed)
Pt presents to ED POV. Pt c/o hematuria intermittently for a few days. Pt had blood work earlier at pulmonologist and was called later that his kidney function was very high

## 2021-03-29 NOTE — Assessment & Plan Note (Signed)
See above plan. 

## 2021-03-29 NOTE — ED Provider Notes (Signed)
Emergency Medicine Provider Triage Evaluation Note  Samuel [name] , a 70 y.o. male  was evaluated in triage.  Pt complains of hematuria. Seen by pulm and had labs drawn which showed an aki. Sent here for further eval.  Review of Systems  Positive: Hematuria, abnormal labs Negative: fever  Physical Exam  BP (!) 155/80 (BP Location: Right Arm)   Pulse 99   Temp 98.3 F (36.8 C)   Resp (!) 22   SpO2 98%  Gen:   Awake, no distress   Resp:  Normal effort  MSK:   Moves extremities without difficulty   Medical Decision Making  Medically screening exam initiated at 6:43 PM.  Appropriate orders placed.  Samuel [name] was informed that the remainder of the evaluation will be completed by another provider, this initial triage assessment does not replace that evaluation, and the importance of remaining in the ED until their evaluation is complete.    Rodney Booze, PA-C 03/29/21 1843    Pattricia Boss, MD 04/01/21 [phone]

## 2021-03-29 NOTE — Telephone Encounter (Signed)
On call- Wife had called answering service about notification to go to ER. When I returned call, Mr Samuel [name] answered and indicated they were on the way to ER now- marked decline in renal function.

## 2021-03-29 NOTE — Progress Notes (Signed)
Spoke with pt and notified of results per Dr. Marland Kitchen, NP. Pt verbalized understanding and denied any questions.

## 2021-03-29 NOTE — Telephone Encounter (Signed)
Pt lives in [place] and would prefer to attend the [facility]. Faxed referral over to them. Closed referral

## 2021-03-29 NOTE — Patient Instructions (Addendum)
Continue Breztri 2 puffs Twice daily, rinse after Continue albuterol inhaler 2 puffs or nebulizer 3 mL every 6 hours as needed for wheezing or shortness of breath Continue oxygen at night at 2L/min Labs obtained today to assess for any other possible underlying illness/disease. We will notify you of results. Walking oximetry today with low of 92% on RA  Referral to cardiology for persistent shortness of breath despite treatment and recent pre-syncopal episode  Pulmonary rehab referral placed. Follow up if you do not hear back from them for scheduling.  Your overnight oximetry study showed that you are having multiple episodes of oxygen saturation at night. This is an indication for evaluation of sleep apnea. Split night in-lab sleep study discussed today and referral placed.   Chest x ray today to assess for possible underlying illness/infection. We will notify you of your results.  If you develop worsening shortness of breath, fever, chills, chest pain, or swelling in your legs, notify immediately and/or seek emergency care.   Follow up in one month with Samuel [name] or APP. If symptoms do not improve or worsen, please contact office for sooner follow up or seek emergency care.

## 2021-03-29 NOTE — Assessment & Plan Note (Deleted)
Patient Instructions  Continue Breztri 2 puffs Twice daily, rinse after Continue albuterol inhaler 2 puffs or nebulizer 3 mL every 6 hours as needed for wheezing or shortness of breath Continue oxygen at night at 2L/min Labs obtained today to assess for any other possible underlying illness/disease. We will notify you of results. Walking oximetry today with low of 92% on RA  Referral to cardiology for persistent shortness of breath despite treatment and recent pre-syncopal episode  Pulmonary rehab referral placed. Follow up if you do not hear back from them for scheduling.  Your overnight oximetry study showed that you are having multiple episodes of oxygen saturation at night. This is an indication for evaluation of sleep apnea. Split night in-lab sleep study discussed today and referral placed.   Chest x ray today to assess for possible underlying illness/infection. We will notify you of your results.  If you develop worsening shortness of breath, fever, chills, chest pain, or swelling in your legs, notify immediately and/or seek emergency care.   Follow up in one month with Dr. Erin Fulling or APP. If symptoms do not improve or worsen, please contact office for sooner follow up or seek emergency care.

## 2021-03-29 NOTE — Progress Notes (Signed)
I contacted patient to notify of critical values for kidney function (creatinine 5.9, BUN 90, GFR 9.02). I spoke to he and his wife, per his approval, and advised they go to the ED now for further evaluation and workup. The wife noted upon discussion that he had recently had hematuria, which was not mentioned during previous exam.

## 2021-03-30 ENCOUNTER — Inpatient Hospital Stay (HOSPITAL_COMMUNITY): Payer: Medicare Other

## 2021-03-30 ENCOUNTER — Encounter (HOSPITAL_COMMUNITY): Payer: Self-pay | Admitting: Internal Medicine

## 2021-03-30 ENCOUNTER — Emergency Department (HOSPITAL_COMMUNITY): Payer: Medicare Other

## 2021-03-30 DIAGNOSIS — Z8042 Family history of malignant neoplasm of prostate: Secondary | ICD-10-CM | POA: Diagnosis not present

## 2021-03-30 DIAGNOSIS — N136 Pyonephrosis: Secondary | ICD-10-CM | POA: Diagnosis present

## 2021-03-30 DIAGNOSIS — Z86010 Personal history of colonic polyps: Secondary | ICD-10-CM | POA: Diagnosis not present

## 2021-03-30 DIAGNOSIS — Z9981 Dependence on supplemental oxygen: Secondary | ICD-10-CM | POA: Diagnosis not present

## 2021-03-30 DIAGNOSIS — F419 Anxiety disorder, unspecified: Secondary | ICD-10-CM | POA: Diagnosis present

## 2021-03-30 DIAGNOSIS — N139 Obstructive and reflux uropathy, unspecified: Secondary | ICD-10-CM | POA: Diagnosis present

## 2021-03-30 DIAGNOSIS — N179 Acute kidney failure, unspecified: Secondary | ICD-10-CM | POA: Diagnosis present

## 2021-03-30 DIAGNOSIS — D631 Anemia in chronic kidney disease: Secondary | ICD-10-CM | POA: Diagnosis present

## 2021-03-30 DIAGNOSIS — B964 Proteus (mirabilis) (morganii) as the cause of diseases classified elsewhere: Secondary | ICD-10-CM | POA: Diagnosis present

## 2021-03-30 DIAGNOSIS — Z823 Family history of stroke: Secondary | ICD-10-CM | POA: Diagnosis not present

## 2021-03-30 DIAGNOSIS — C679 Malignant neoplasm of bladder, unspecified: Secondary | ICD-10-CM | POA: Diagnosis present

## 2021-03-30 DIAGNOSIS — E872 Acidosis, unspecified: Secondary | ICD-10-CM | POA: Diagnosis present

## 2021-03-30 DIAGNOSIS — E871 Hypo-osmolality and hyponatremia: Secondary | ICD-10-CM | POA: Diagnosis present

## 2021-03-30 DIAGNOSIS — N1832 Chronic kidney disease, stage 3b: Secondary | ICD-10-CM | POA: Diagnosis present

## 2021-03-30 DIAGNOSIS — J439 Emphysema, unspecified: Secondary | ICD-10-CM | POA: Diagnosis present

## 2021-03-30 DIAGNOSIS — M19031 Primary osteoarthritis, right wrist: Secondary | ICD-10-CM | POA: Diagnosis present

## 2021-03-30 DIAGNOSIS — Z20822 Contact with and (suspected) exposure to covid-19: Secondary | ICD-10-CM | POA: Diagnosis present

## 2021-03-30 DIAGNOSIS — I129 Hypertensive chronic kidney disease with stage 1 through stage 4 chronic kidney disease, or unspecified chronic kidney disease: Secondary | ICD-10-CM | POA: Diagnosis present

## 2021-03-30 DIAGNOSIS — F32A Depression, unspecified: Secondary | ICD-10-CM | POA: Diagnosis present

## 2021-03-30 DIAGNOSIS — E785 Hyperlipidemia, unspecified: Secondary | ICD-10-CM | POA: Diagnosis present

## 2021-03-30 DIAGNOSIS — N390 Urinary tract infection, site not specified: Secondary | ICD-10-CM | POA: Diagnosis not present

## 2021-03-30 DIAGNOSIS — R32 Unspecified urinary incontinence: Secondary | ICD-10-CM | POA: Diagnosis present

## 2021-03-30 DIAGNOSIS — R310 Gross hematuria: Secondary | ICD-10-CM | POA: Diagnosis present

## 2021-03-30 DIAGNOSIS — Z66 Do not resuscitate: Secondary | ICD-10-CM | POA: Diagnosis present

## 2021-03-30 DIAGNOSIS — Z803 Family history of malignant neoplasm of breast: Secondary | ICD-10-CM | POA: Diagnosis not present

## 2021-03-30 DIAGNOSIS — Z87891 Personal history of nicotine dependence: Secondary | ICD-10-CM | POA: Diagnosis not present

## 2021-03-30 HISTORY — PX: IR NEPHROSTOMY PLACEMENT LEFT: IMG6063

## 2021-03-30 LAB — TROPONIN I (HIGH SENSITIVITY): Troponin I (High Sensitivity): 5 ng/L (ref <18)

## 2021-03-30 LAB — PROTIME-INR
INR: 1.1 (ref 0.8–1.2)
Prothrombin Time: 14.4 seconds (ref 11.4–15.2)

## 2021-03-30 MED ORDER — FINASTERIDE 5 MG PO TABS
5.0000 mg | ORAL_TABLET | Freq: Every day | ORAL | Status: DC
  Administered 2021-03-30 – 2021-04-03 (×5): 5 mg via ORAL
  Filled 2021-03-30 (×5): qty 1

## 2021-03-30 MED ORDER — LIDOCAINE HCL (PF) 1 % IJ SOLN
INTRAMUSCULAR | Status: AC
  Filled 2021-03-30: qty 30

## 2021-03-30 MED ORDER — METOPROLOL SUCCINATE ER 25 MG PO TB24
25.0000 mg | ORAL_TABLET | Freq: Every day | ORAL | Status: DC
  Administered 2021-03-30 – 2021-04-03 (×5): 25 mg via ORAL
  Filled 2021-03-30 (×6): qty 1

## 2021-03-30 MED ORDER — ACETAMINOPHEN 650 MG RE SUPP: 650.0000 mg | Freq: Four times a day (QID) | RECTAL | Status: DC | PRN

## 2021-03-30 MED ORDER — MIDAZOLAM HCL 2 MG/2ML IJ SOLN
INTRAMUSCULAR | Status: AC | PRN
  Administered 2021-03-30: .5 mg via INTRAVENOUS
  Administered 2021-03-30: 1 mg via INTRAVENOUS
  Administered 2021-03-30: .5 mg via INTRAVENOUS

## 2021-03-30 MED ORDER — BUDESON-GLYCOPYRROL-FORMOTEROL 160-9-4.8 MCG/ACT IN AERO: 2.0000 | INHALATION_SPRAY | Freq: Two times a day (BID) | RESPIRATORY_TRACT | Status: DC

## 2021-03-30 MED ORDER — ONDANSETRON HCL 4 MG/2ML IJ SOLN: 4.0000 mg | Freq: Four times a day (QID) | INTRAMUSCULAR | Status: DC | PRN

## 2021-03-30 MED ORDER — BISACODYL 5 MG PO TBEC: 5.0000 mg | DELAYED_RELEASE_TABLET | Freq: Every day | ORAL | Status: DC | PRN

## 2021-03-30 MED ORDER — ALBUTEROL SULFATE (2.5 MG/3ML) 0.083% IN NEBU: 2.5000 mg | INHALATION_SOLUTION | Freq: Four times a day (QID) | RESPIRATORY_TRACT | Status: DC | PRN

## 2021-03-30 MED ORDER — IOHEXOL 300 MG/ML  SOLN
100.0000 mL | Freq: Once | INTRAMUSCULAR | Status: AC | PRN
  Administered 2021-03-30: 20 mL

## 2021-03-30 MED ORDER — FLUTICASONE FUROATE-VILANTEROL 100-25 MCG/ACT IN AEPB
1.0000 | INHALATION_SPRAY | Freq: Every day | RESPIRATORY_TRACT | Status: DC
  Administered 2021-03-31 – 2021-04-03 (×4): 1 via RESPIRATORY_TRACT
  Filled 2021-03-30 (×2): qty 28

## 2021-03-30 MED ORDER — CITALOPRAM HYDROBROMIDE 20 MG PO TABS
20.0000 mg | ORAL_TABLET | Freq: Every day | ORAL | Status: DC
  Administered 2021-03-30 – 2021-04-03 (×5): 20 mg via ORAL
  Filled 2021-03-30 (×3): qty 1
  Filled 2021-03-30: qty 2
  Filled 2021-03-30: qty 1

## 2021-03-30 MED ORDER — LEVOFLOXACIN IN D5W 500 MG/100ML IV SOLN: 500.0000 mg | Freq: Once | INTRAVENOUS | Status: DC

## 2021-03-30 MED ORDER — SODIUM CHLORIDE 0.9 % IV SOLN
1.0000 g | Freq: Once | INTRAVENOUS | Status: AC
  Administered 2021-03-30: 1 g via INTRAVENOUS
  Filled 2021-03-30: qty 10

## 2021-03-30 MED ORDER — POLYETHYLENE GLYCOL 3350 17 G PO PACK: 17.0000 g | PACK | Freq: Every day | ORAL | Status: DC | PRN

## 2021-03-30 MED ORDER — ONDANSETRON HCL 4 MG PO TABS: 4.0000 mg | ORAL_TABLET | Freq: Four times a day (QID) | ORAL | Status: DC | PRN

## 2021-03-30 MED ORDER — MORPHINE SULFATE (PF) 2 MG/ML IV SOLN: 2.0000 mg | INTRAVENOUS | Status: DC | PRN

## 2021-03-30 MED ORDER — AMLODIPINE BESYLATE 10 MG PO TABS
10.0000 mg | ORAL_TABLET | Freq: Every day | ORAL | Status: DC
  Administered 2021-03-30 – 2021-04-03 (×5): 10 mg via ORAL
  Filled 2021-03-30: qty 2
  Filled 2021-03-30 (×4): qty 1

## 2021-03-30 MED ORDER — ACETAMINOPHEN 325 MG PO TABS
650.0000 mg | ORAL_TABLET | Freq: Four times a day (QID) | ORAL | Status: DC | PRN
  Administered 2021-04-02: 650 mg via ORAL
  Filled 2021-03-30: qty 2

## 2021-03-30 MED ORDER — FENTANYL CITRATE (PF) 100 MCG/2ML IJ SOLN
INTRAMUSCULAR | Status: AC
  Filled 2021-03-30: qty 2

## 2021-03-30 MED ORDER — FENTANYL CITRATE (PF) 100 MCG/2ML IJ SOLN
INTRAMUSCULAR | Status: AC | PRN
  Administered 2021-03-30 (×3): 25 ug via INTRAVENOUS

## 2021-03-30 MED ORDER — SODIUM CHLORIDE 0.9 % IV BOLUS
1000.0000 mL | Freq: Once | INTRAVENOUS | Status: AC
  Administered 2021-03-30: 1000 mL via INTRAVENOUS

## 2021-03-30 MED ORDER — TAMSULOSIN HCL 0.4 MG PO CAPS
0.8000 mg | ORAL_CAPSULE | Freq: Every day | ORAL | Status: DC
  Administered 2021-03-30 – 2021-04-02 (×4): 0.8 mg via ORAL
  Filled 2021-03-30 (×4): qty 2

## 2021-03-30 MED ORDER — LACTATED RINGERS IV SOLN: INTRAVENOUS | Status: DC

## 2021-03-30 MED ORDER — MIDAZOLAM HCL 2 MG/2ML IJ SOLN
INTRAMUSCULAR | Status: AC
  Filled 2021-03-30: qty 4

## 2021-03-30 MED ORDER — OXYCODONE HCL 5 MG PO TABS
5.0000 mg | ORAL_TABLET | ORAL | Status: DC | PRN
  Administered 2021-04-02 – 2021-04-03 (×3): 5 mg via ORAL
  Filled 2021-03-30 (×3): qty 1

## 2021-03-30 MED ORDER — ATORVASTATIN CALCIUM 40 MG PO TABS
40.0000 mg | ORAL_TABLET | Freq: Every day | ORAL | Status: DC
  Administered 2021-03-30 – 2021-04-03 (×5): 40 mg via ORAL
  Filled 2021-03-30 (×5): qty 1

## 2021-03-30 MED ORDER — UMECLIDINIUM BROMIDE 62.5 MCG/ACT IN AEPB
1.0000 | INHALATION_SPRAY | Freq: Every day | RESPIRATORY_TRACT | Status: DC
  Administered 2021-03-31 – 2021-04-03 (×4): 1 via RESPIRATORY_TRACT
  Filled 2021-03-30 (×2): qty 7

## 2021-03-30 MED ORDER — HYDRALAZINE HCL 20 MG/ML IJ SOLN: 5.0000 mg | INTRAMUSCULAR | Status: DC | PRN

## 2021-03-30 MED ORDER — DOCUSATE SODIUM 100 MG PO CAPS
100.0000 mg | ORAL_CAPSULE | Freq: Two times a day (BID) | ORAL | Status: DC
  Administered 2021-03-30 – 2021-04-03 (×9): 100 mg via ORAL
  Filled 2021-03-30 (×9): qty 1

## 2021-03-30 MED ORDER — LIDOCAINE HCL (PF) 1 % IJ SOLN
INTRAMUSCULAR | Status: AC | PRN
  Administered 2021-03-30: 20 mL

## 2021-03-30 NOTE — Consult Note (Addendum)
Chief Complaint: Patient was seen in consultation today for bilateral percutaneous nephrostomies Chief Complaint  Patient presents with   Hematuria    Referring Physician(s): [place]  Supervising Physician: Juliet Rude  Patient Status: [facility] - ED  History of Present Illness: Samuel [name] is a 70 y.o. male with PMH sig for tobacco use, anxiety/depression, BPH, CKD, congenital deformity of left hand, COPD, HTN, HLD, hematuria, previous bladder cancer with TURBT in May 2022. He was being seen for recheck of his COPD by pulmonologist and a recent episode of near syncope.  Several days ago he had episode of hot flashes with nausea and had them for few minutes before resolving.  Never did have chest pain.  He states his breathing is at baseline.He was told by his pulmonologist that his kidney function was significantly elevated and he needed to come to the ED. He has had intermittent blood in the urine for the past several days but no flank pain or abdominal pain.  Believes he is urinating normally.  Does have some incontinence from time to time which is unchanged.Denies any flank pain.  Denies abdominal pain.  No history of kidney stones.  No recent medication changes. CT A/P done today revealed:   1. Large posterior bladder obstructing both distal ureters at the UVJs. 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 Associated pelvic and retroperitoneal lymphadenopathy highly suspicious for nodal metastatic disease. And note also additional nodularity of the posterior bladder fundus.   2. Severe bilateral hydronephrosis and hydroureter due to #1.   3. Aortic Atherosclerosis (ICD10-I70.0) and Emphysema   Current labs include: creat 6.06, WBC 10.4, hgb 9.7, plts 408k, PT/INR pending. Request now received from urology for bilateral percutaneous nephrostomies.    Past Medical History:  Diagnosis Date   Anxiety    Bladder cancer [facility])    urologist--- dr Tresa Moore   BPH  with obstruction/lower urinary tract symptoms    CKD (chronic kidney disease), stage III ([place])    Congenital deformity of left hand    COPD (chronic obstructive pulmonary disease) ([place])    followed by pcp   Depression    Essential hypertension    followed by pcp   History of adenomatous polyp of colon    Hyperlipidemia    Renal cysts, acquired, bilateral    Wears glasses     Past Surgical History:  Procedure Laterality Date   COLONOSCOPY WITH PROPOFOL  last one 02-04-2017  dr Henrene Pastor   CYSTOSCOPY W/ RETROGRADES Bilateral 09/15/2020   Procedure: CYSTOSCOPY WITH RETROGRADE PYELOGRAM;  Surgeon: Alexis Frock, MD;  Location: [facility];  Service: Urology;  Laterality: Bilateral;   NO PAST SURGERIES     TRANSURETHRAL RESECTION OF BLADDER TUMOR N/A 09/15/2020   Procedure: BLADDER BIOPSY FULGERATION;  Surgeon: Alexis Frock, MD;  Location: [facility];  Service: Urology;  Laterality: N/A;  72 MINS    Allergies: Penicillins  Medications: Prior to Admission medications   Medication Sig Start Date End Date Taking? Authorizing Provider  acetaminophen (TYLENOL) 500 MG tablet Take 500 mg by mouth every 6 (six) hours as needed. Patient not taking: Reported on 03/29/2021    [provider]  albuterol (PROVENTIL) (2.5 MG/3ML) 0.083% nebulizer solution Take 3 mLs (2.5 mg total) by nebulization every 6 (six) hours as needed for wheezing or shortness of breath. Patient not taking: Reported on 03/29/2021 11/08/20   Freddi Starr, MD  albuterol (VENTOLIN HFA) 108 (90 Base) MCG/ACT inhaler Inhale  2 puffs into the lungs every 6 (six) hours as needed for wheezing or shortness of breath. 11/08/20   Freddi Starr, MD  amLODipine (NORVASC) 10 MG tablet Take 10 mg by mouth daily. 06/05/20   [provider]  atorvastatin (LIPITOR) 40 MG tablet Take 1 tablet (40 mg total) by mouth daily. Take 1 tab daily 11/21/20   [name], [name], NP   Budeson-Glycopyrrol-Formoterol (BREZTRI AEROSPHERE) 160-9-4.8 MCG/ACT AERO Inhale 2 puffs into the lungs in the morning and at bedtime. 03/27/21   [name], [name], NP  Budeson-Glycopyrrol-Formoterol (BREZTRI AEROSPHERE) 160-9-4.8 MCG/ACT AERO Use as directed.  KZL#[id number] c00 Exp 1/25  4 boxes Patient not taking: Reported on 03/29/2021 03/27/21   Dorothyann Peng, NP  Cholecalciferol (VITAMIN D3) 25 MCG (1000 UT) CAPS Take 1 capsule by mouth daily. Patient not taking: Reported on 03/29/2021    [provider]  citalopram (CELEXA) 20 MG tablet Take 1 tablet (20 mg total) by mouth daily. 03/27/21   [name], [name], NP  finasteride (PROSCAR) 5 MG tablet Take 5 mg by mouth daily. 05/15/20   Alexis Frock, MD  metoprolol succinate (TOPROL-XL) 25 MG 24 hr tablet Take 25 mg by mouth daily.    [provider]  tamsulosin (FLOMAX) 0.4 MG CAPS capsule Take 2 capsules by mouth at bedtime. 04/21/20   [provider]  vitamin B-12 (CYANOCOBALAMIN) 500 MCG tablet Take 1,000 mcg by mouth daily.    [provider]     Family History  Problem Relation Age of Onset   Hypertension Mother    Stroke Mother    Heart disease Father    Breast cancer Sister    Prostate cancer Brother    Hypertension Sister    Colon cancer Neg Hx    Esophageal cancer Neg Hx    Pancreatic cancer Neg Hx    Rectal cancer Neg Hx    Stomach cancer Neg Hx     Social History   Socioeconomic History   Marital status: Married    Spouse name: Not on file   Number of children: Not on file   Years of education: Not on file   Highest education level: Not on file  Occupational History   Not on file  Tobacco Use   Smoking status: Former    Years: 58.00    Types: Cigarettes    Quit date: 01/16/2017    Years since quitting: 4.2   Smokeless tobacco: Never  Vaping Use   Vaping Use: Never used  Substance and Sexual Activity   Alcohol use: No   Drug use: Never   Sexual activity: Not on file  Other  Topics Concern   Not on file  Social History Narrative   Retired    Married       He watches television   Social Determinants of Health   Financial Resource Strain: Medium Risk   Difficulty of Paying Living Expenses: Somewhat hard  Food Insecurity: No Food Insecurity   Worried About Charity fundraiser in the Last Year: Never true   Arboriculturist in the Last Year: Never true  Transportation Needs: No Transportation Needs   Lack of Transportation (Medical): No   Lack of Transportation (Non-Medical): No  Physical Activity: Inactive   Days of Exercise per Week: 0 days   Minutes of Exercise per Session: 0 min  Stress: No Stress Concern Present   Feeling of Stress : Not at all  Social Connections: Moderately Isolated   Frequency of Communication  with Friends and Family: More than three times a week   Frequency of Social Gatherings with Friends and Family: More than three times a week   Attends Religious Services: Never   Marine scientist or Organizations: No   Attends Music therapist: Never   Marital Status: Married     Review of Systems see above; has chronic dyspnea  Vital Signs: BP 139/79   Pulse 87   Temp 97.9 F (36.6 C) (Oral)   Resp (!) 25   SpO2 96%   Physical Exam: awake/alert; chest- distant BS bilat; heart- RRR; abd- soft,+BS, sl distended, some mild lower abdominal tenderness; no LE edema  Imaging: DG Chest 2 View  Result Date: 03/29/2021 CLINICAL DATA:  Shortness of breath EXAM: CHEST - 2 VIEW COMPARISON:  Chest x-ray dated June 08, 2020 FINDINGS: Cardiac and mediastinal contours are unchanged. Bilateral linear opacities, likely related to underlying emphysema. No new parenchymal process. No large pleural effusion or evidence of pneumothorax. IMPRESSION: Chronic findings related to emphysema.  No focal opacity. Electronically Signed   By: Yetta Glassman M.D.   On: 03/29/2021 12:01   CT Renal Stone Study  Result Date:  03/30/2021 CLINICAL DATA:  70 year old male with gross hematuria. Acute renal insufficiency. EXAM: CT ABDOMEN AND PELVIS WITHOUT CONTRAST TECHNIQUE: Multidetector CT imaging of the abdomen and pelvis was performed following the standard protocol without IV contrast. COMPARISON:  Chest CT 09/27/2020. FINDINGS: Lower chest: Emphysema. Stable lung bases, mild scarring. No cardiomegaly, pericardial effusion or pleural effusion. Hepatobiliary: Negative noncontrast liver and gallbladder. Pancreas: Negative. Spleen: Negative. Adrenals/Urinary Tract: Normal adrenal glands. Severe bilateral hydronephrosis and hydroureter. A degree of chronic atrophy of the left kidney is suspected. There are superimposed exophytic but probable benign bilateral renal cysts. Tortuous ureters remain dilated to the ureterovesical junctions, where a fungating, lobulated intermediate density posterior bladder mass is identified. See series 3, image 65 and coronal image 88. The mass is eccentric to the left, but appears to extend across midline posteriorly (series 3, image 67) and encompasses 27 by 50 by 65 mm (AP by transverse by CC). Bilateral distal hydroureter is 13-14 mm diameter. Relatively normal bladder volume. Note also superimposed posterior bladder fundus nodularity (series 7, image 68), although might be trabeculation. Stomach/Bowel: Severe diverticulosis of the sigmoid colon, but no active inflammation. Redundant large bowel with mild retained stool throughout. Normal retrocecal appendix. Decompressed and negative terminal ileum. No dilated small bowel. Decompressed stomach. Negative duodenum. No free air or free fluid. Vascular/Lymphatic: Extensive Aortoiliac calcified atherosclerosis. Normal caliber abdominal aorta. Maximal to mildly enlarged left retroperitoneal lymph node at the pelvic inlet series 3, image 46, 11 mm short axis. Right greater than left iliac Chain pelvic lymphadenopathy, up to 17 mm short axis on the right.  Reproductive: Negative aside from urinary bladder abnormality. Other: No pelvic free fluid. Musculoskeletal: Degeneration in the spine. No acute osseous abnormality identified. IMPRESSION: 1. Large posterior bladder obstructing both distal ureters at the UVJs. 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 Associated pelvic and retroperitoneal lymphadenopathy highly suspicious for nodal metastatic disease. And note also additional nodularity of the posterior bladder fundus. 2. Severe bilateral hydronephrosis and hydroureter due to #1. 3. Aortic Atherosclerosis (ICD10-I70.0) and Emphysema (ICD10-J43.9). Electronically Signed   By: Genevie Ann M.D.   On: 03/30/2021 07:24    Labs:  CBC: Recent Labs    04/04/20 0748 09/15/20 0659 03/29/21 1142 03/29/21 1852  WBC 10.1  --  9.2 10.4  HGB  14.4 13.9 10.3* 9.7*  HCT 43.6 41.0 30.7* 30.3*  PLT 437*  --  444.0* 408*    COAGS: No results for input(s): INR, APTT in the last 8760 hours.  BMP: Recent Labs    04/04/20 0748 09/15/20 0659 03/29/21 1142 03/29/21 1852  NA 140 141 137 134*  K 5.4* 4.4 5.1 4.8  CL 105 107 105 106  CO2 27  --  19 14*  GLUCOSE 113* 120* 97 110*  BUN 48* 42* 90* 95*  CALCIUM 10.3  --  9.5 8.8*  CREATININE 2.14* 2.10* 5.90* 6.06*  GFRNONAA 30*  --   --  9*  GFRAA 35*  --   --   --     LIVER FUNCTION TESTS: Recent Labs    04/04/20 0748  BILITOT 0.5  AST 13  ALT 15  PROT 7.4    TUMOR MARKERS: No results for input(s): AFPTM, CEA, CA199, CHROMGRNA in the last 8760 hours.  Assessment and Plan: 70 y.o. male with PMH sig for tobacco use, anxiety/depression, BPH, CKD, congenital deformity of left hand, COPD, HTN, HLD, hematuria, previous bladder cancer with TURBT in May 2022. He was being seen for recheck of his COPD by pulmonologist and a recent episode of near syncope.  Several days ago he had episode of hot flashes with nausea and had them for few minutes before resolving.  Never did have chest pain.  He states his  breathing is at baseline.He was told by his pulmonologist that his kidney function was significantly elevated and he needed to come to the ED. He has had intermittent blood in the urine for the past several days but no flank pain or abdominal pain.  Believes he is urinating normally.  Does have some incontinence from time to time which is unchanged.Denies any flank pain. Denies abdominal pain.  No history of kidney stones.  No recent medication changes. CT A/P done today revealed:   1. Large posterior bladder obstructing both distal ureters at the UVJs. 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 Associated pelvic and retroperitoneal lymphadenopathy highly suspicious for nodal metastatic disease. And note also additional nodularity of the posterior bladder fundus.   2. Severe bilateral hydronephrosis and hydroureter due to #1.   3. Aortic Atherosclerosis (ICD10-I70.0) and Emphysema   Current labs include: creat 6.06, WBC 10.4, hgb 9.7, plts 408k, PT/INR pending. Request now received from urology for bilateral percutaneous nephrostomies.Imaging studies were reviewed by Dr. Ky Barban- Abd. Risks and benefits of bilateral PCN placement was discussed with the patient /spouse including, but not limited to, infection, bleeding, significant bleeding causing loss or decrease in renal function or damage to adjacent structures.   All of the patient's questions were answered, patient is agreeable to proceed.  Consent signed and in chart. Will tent plan procedure for today schedule permitting. Pt had IV rocephin this am at 0830.       Thank you for this interesting consult.  I greatly enjoyed meeting Naftuli Dalsanto and look forward to participating in their care.  A copy of this report was sent to the requesting provider on this date.  Electronically Signed: D. Rowe Robert, PA-C 03/30/2021, 10:51 AM   I spent a total of 30 minutes  in face to face in clinical consultation, greater than 50%  of which was counseling/coordinating care for bilateral percutaneous nephrostomies

## 2021-03-30 NOTE — Consult Note (Signed)
Reason for Consult: AKI/CKD stage IIIb Referring Physician: Lorin Mercy, MD  Samuel [name] is an 70 y.o. male with a PMH significant for HTN, HLD, BPH, COPD, anxiety/depression, bladder cancer s/p TURBT (09/2020 followed by Dr. 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 Royce Macadamia with baseline Scr 1.8-2.1) who was told by his pulmonologist to go to the ED due to worsening renal function.  He has had intermittent gross hematuria but denied any flank pain, fevers, chills, abdominal pain, dysuria, pyuria, urgency, frequency, or retention.  In the ED labs were notable for Hgb 9.7, Na 134, Co2 13, BUN 95, Cr 6.06, Ca 8.8.  UA with gross hematuria.  CT scan revealed a significant mass in the bladder with bilateral hydronephrosis.  He was admitted for further evaluation and we were consulted due to his AKI/CKD stage IIIb.  Urology was consulted and recommended bilateral percutaneous nephrostomy tube placement by IR.  He denies any N/V/D, flank pain, NSAIDs, or cox-II I's.  No new medications.   Trend in Creatinine: Creatinine, Ser  Date/Time Value Ref Range Status  03/29/2021 06:52 PM 6.06 (H) 0.61 - 1.24 mg/dL Final  03/29/2021 11:42 AM 5.90 (HH) 0.40 - 1.50 mg/dL Final  09/15/2020 06:59 AM 2.10 (H) 0.61 - 1.24 mg/dL Final  02/19/2019 07:21 AM 1.75 (H) 0.40 - 1.50 mg/dL Final  12/19/2017 07:49 AM 1.13 0.40 - 1.50 mg/dL Final  12/17/2016 07:33 AM 0.92 0.40 - 1.50 mg/dL Final    PMH:   Past Medical History:  Diagnosis Date   Anxiety    Bladder cancer [facility])    urologist--- dr Tresa Moore   BPH with obstruction/lower urinary tract symptoms    CKD (chronic kidney disease), stage III ([place])    Congenital deformity of left hand    COPD (chronic obstructive pulmonary disease) ([place])    followed by pcp   Depression    Essential hypertension    followed by pcp   History of adenomatous polyp of colon    Hyperlipidemia    Renal cysts, acquired, bilateral    Wears glasses      PSH:   Past Surgical History:  Procedure Laterality Date   COLONOSCOPY WITH PROPOFOL  last one 02-04-2017  dr Henrene Pastor   CYSTOSCOPY W/ RETROGRADES Bilateral 09/15/2020   Procedure: CYSTOSCOPY WITH RETROGRADE PYELOGRAM;  Surgeon: Alexis Frock, MD;  Location: [facility];  Service: Urology;  Laterality: Bilateral;   IR NEPHROSTOMY PLACEMENT LEFT  03/30/2021   NO PAST SURGERIES     TRANSURETHRAL RESECTION OF BLADDER TUMOR N/A 09/15/2020   Procedure: BLADDER BIOPSY FULGERATION;  Surgeon: Alexis Frock, MD;  Location: [facility];  Service: Urology;  Laterality: N/A;  45 MINS    Allergies:  Allergies  Allergen Reactions   Penicillins Nausea And Vomiting    Medications:   Prior to Admission medications   Medication Sig Start Date End Date Taking? Authorizing Provider  albuterol (PROVENTIL) (2.5 MG/3ML) 0.083% nebulizer solution Take 2.5 mg by nebulization in the morning and at bedtime.   Yes [provider]  albuterol (VENTOLIN HFA) 108 (90 Base) MCG/ACT inhaler Inhale 2 puffs into the lungs every 6 (six) hours as needed for wheezing or shortness of breath. 11/08/20  Yes Dewald, Cheryle Horsfall, MD  amLODipine (NORVASC) 10 MG tablet Take 10 mg by mouth daily. 06/05/20  Yes [provider]  atorvastatin (LIPITOR) 40 MG tablet Take 1 tablet (40 mg total) by mouth daily. Take 1 tab daily Patient taking  differently: Take 40 mg by mouth daily. 11/21/20  Yes [name], [name], NP  Budeson-Glycopyrrol-Formoterol (BREZTRI AEROSPHERE) 160-9-4.8 MCG/ACT AERO Inhale 2 puffs into the lungs in the morning and at bedtime. 03/27/21  Yes [name], [name], NP  Cholecalciferol (VITAMIN D3 PO) Take 1 tablet by mouth daily.   Yes [provider]  citalopram (CELEXA) 20 MG tablet Take 1 tablet (20 mg total) by mouth daily. Patient taking differently: Take 20 mg by mouth at bedtime. 03/27/21  Yes [name], [name], NP  finasteride (PROSCAR) 5 MG tablet Take 5 mg by  mouth daily. 05/15/20  Yes Alexis Frock, MD  metoprolol succinate (TOPROL-XL) 25 MG 24 hr tablet Take 25 mg by mouth daily.   Yes [provider]  OXYGEN Inhale 2 L into the lungs See admin instructions. 2L when taking a nap and at bedtime   Yes [provider]  tamsulosin (FLOMAX) 0.4 MG CAPS capsule Take 2 capsules by mouth at bedtime. 04/21/20  Yes [provider]  vitamin B-12 (CYANOCOBALAMIN) 500 MCG tablet Take 1,000 mcg by mouth daily.   Yes [provider]    Inpatient medications:  amLODipine  10 mg Oral Daily   atorvastatin  40 mg Oral Daily   citalopram  20 mg Oral Daily   docusate sodium  100 mg Oral BID   finasteride  5 mg Oral Daily   umeclidinium bromide  1 puff Inhalation Daily   And   fluticasone furoate-vilanterol  1 puff Inhalation Daily   lidocaine (PF)       metoprolol succinate  25 mg Oral Daily   tamsulosin  0.8 mg Oral QHS    Discontinued Meds:   Medications Discontinued During This Encounter  Medication Reason   Cholecalciferol (VITAMIN D3) 25 MCG (1000 UT) CAPS    acetaminophen (TYLENOL) 500 MG tablet    albuterol (PROVENTIL) (2.5 MG/3ML) 0.083% nebulizer solution    Budeson-Glycopyrrol-Formoterol (BREZTRI AEROSPHERE) 160-9-4.8 MCG/ACT AERO    morphine 2 MG/ML injection 2 mg    Budeson-Glycopyrrol-Formoterol 160-9-4.8 MCG/ACT AERO 2 puff    levofloxacin (LEVAQUIN) IVPB 500 mg     Social History:  reports that he quit smoking about 4 years ago. His smoking use included cigarettes. He has never used smokeless tobacco. He reports that he does not drink alcohol and does not use drugs.  Family History:   Family History  Problem Relation Age of Onset   Hypertension Mother    Stroke Mother    Heart disease Father    Breast cancer Sister    Prostate cancer Brother    Hypertension Sister    Colon cancer Neg Hx    Esophageal cancer Neg Hx    Pancreatic cancer Neg Hx    Rectal cancer Neg Hx    Stomach cancer Neg Hx      Pertinent items are noted in HPI. Weight change:   Intake/Output Summary (Last 24 hours) at 03/30/2021 1412 Last data filed at 03/30/2021 0939 Gross per 24 hour  Intake 1098 ml  Output --  Net 1098 ml   BP 138/85   Pulse 82   Temp 97.9 F (36.6 C) (Oral)   Resp 19   SpO2 100%  Vitals:   03/30/21 1255 03/30/21 1300 03/30/21 1305 03/30/21 1310  BP: 135/90 111/74 (!) 145/75 138/85  Pulse: 87 83 85 82  Resp: (!) 22 16 17 19   Temp:      TempSrc:      SpO2: 98% 99% 100% 100%  General appearance: alert, cooperative, and no distress Head: Normocephalic, without obvious abnormality, atraumatic Eyes: negative findings: lids and lashes normal, conjunctivae and sclerae normal, and corneas clear Back: bilateral nephrostomy tubes in place draining bloody urine with clots in left bag Resp: clear to auscultation bilaterally Cardio: regular rate and rhythm, S1, S2 normal, no murmur, click, rub or gallop GI: soft, non-tender; bowel sounds normal; no masses,  no organomegaly Extremities: extremities normal, atraumatic, no cyanosis or edema  Labs: Basic Metabolic Panel: Recent Labs  Lab 03/29/21 1142 03/29/21 1852  NA 137 134*  K 5.1 4.8  CL 105 106  CO2 19 14*  GLUCOSE 97 110*  BUN 90* 95*  CREATININE 5.90* 6.06*  CALCIUM 9.5 8.8*   Liver Function Tests: No results for input(s): AST, ALT, ALKPHOS, BILITOT, PROT, ALBUMIN in the last 168 hours. No results for input(s): LIPASE, AMYLASE in the last 168 hours. No results for input(s): AMMONIA in the last 168 hours. CBC: Recent Labs  Lab 03/29/21 1142 03/29/21 1852  WBC 9.2 10.4  NEUTROABS 7.4 8.3*  HGB 10.3* 9.7*  HCT 30.7* 30.3*  MCV 80.2 82.3  PLT 444.0* 408*   PT/INR: @LABRCNTIP (inr:5) Cardiac Enzymes: )No results for input(s): CKTOTAL, CKMB, CKMBINDEX, TROPONINI in the last 168 hours. CBG: No results for input(s): GLUCAP in the last 168 hours.  Iron Studies: No results for input(s): IRON, TIBC,  TRANSFERRIN, FERRITIN in the last 168 hours.  Xrays/Other Studies: DG Chest 2 View  Result Date: 03/29/2021 CLINICAL DATA:  Shortness of breath EXAM: CHEST - 2 VIEW COMPARISON:  Chest x-ray dated June 08, 2020 FINDINGS: Cardiac and mediastinal contours are unchanged. Bilateral linear opacities, likely related to underlying emphysema. No new parenchymal process. No large pleural effusion or evidence of pneumothorax. IMPRESSION: Chronic findings related to emphysema.  No focal opacity. Electronically Signed   By: Yetta Glassman M.D.   On: 03/29/2021 12:01   CT Renal Stone Study  Result Date: 03/30/2021 CLINICAL DATA:  70 year old male with gross hematuria. Acute renal insufficiency. EXAM: CT ABDOMEN AND PELVIS WITHOUT CONTRAST TECHNIQUE: Multidetector CT imaging of the abdomen and pelvis was performed following the standard protocol without IV contrast. COMPARISON:  Chest CT 09/27/2020. FINDINGS: Lower chest: Emphysema. Stable lung bases, mild scarring. No cardiomegaly, pericardial effusion or pleural effusion. Hepatobiliary: Negative noncontrast liver and gallbladder. Pancreas: Negative. Spleen: Negative. Adrenals/Urinary Tract: Normal adrenal glands. Severe bilateral hydronephrosis and hydroureter. A degree of chronic atrophy of the left kidney is suspected. There are superimposed exophytic but probable benign bilateral renal cysts. Tortuous ureters remain dilated to the ureterovesical junctions, where a fungating, lobulated intermediate density posterior bladder mass is identified. See series 3, image 65 and coronal image 88. The mass is eccentric to the left, but appears to extend across midline posteriorly (series 3, image 67) and encompasses 27 by 50 by 65 mm (AP by transverse by CC). Bilateral distal hydroureter is 13-14 mm diameter. Relatively normal bladder volume. Note also superimposed posterior bladder fundus nodularity (series 7, image 68), although might be trabeculation. Stomach/Bowel:  Severe diverticulosis of the sigmoid colon, but no active inflammation. Redundant large bowel with mild retained stool throughout. Normal retrocecal appendix. Decompressed and negative terminal ileum. No dilated small bowel. Decompressed stomach. Negative duodenum. No free air or free fluid. Vascular/Lymphatic: Extensive Aortoiliac calcified atherosclerosis. Normal caliber abdominal aorta. Maximal to mildly enlarged left retroperitoneal lymph node at the pelvic inlet series 3, image 46, 11 mm short axis. Right greater than left iliac Chain pelvic lymphadenopathy, up to  17 mm short axis on the right. Reproductive: Negative aside from urinary bladder abnormality. Other: No pelvic free fluid. Musculoskeletal: Degeneration in the spine. No acute osseous abnormality identified. IMPRESSION: 1. Large posterior bladder obstructing both distal ureters at the UVJs. 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 Associated pelvic and retroperitoneal lymphadenopathy highly suspicious for nodal metastatic disease. And note also additional nodularity of the posterior bladder fundus. 2. Severe bilateral hydronephrosis and hydroureter due to #1. 3. Aortic Atherosclerosis (ICD10-I70.0) and Emphysema (ICD10-J43.9). Electronically Signed   By: Genevie Ann M.D.   On: 03/30/2021 07:24   IR NEPHROSTOMY PLACEMENT LEFT  Result Date: 03/30/2021 INDICATION: Bladder mass with bilateral obstructive uropathy and acute on chronic kidney injury EXAM: 1. Ultrasound-guided puncture of a left renal calyx 2. Left antegrade nephrostogram through percutaneous access 3. Placement of a left percutaneous nephrostomy tube using fluoroscopic guidance 4. Ultrasound-guided puncture of a right renal calyx 5. Right antegrade nephrostogram to percutaneous access 6. Placement of a right percutaneous nephrostomy tube using fluoroscopic guidance COMPARISON:  None. MEDICATIONS: The patient received a single dose of Rocephin prior to arriving to the IR suite.  ANESTHESIA/SEDATION: Fentanyl 2 mcg IV; Versed 75 mg IV Moderate Sedation Time:  24 The patient was continuously monitored during the procedure by the interventional radiology nurse under my direct supervision. CONTRAST:  20 mL-administered into the collecting system(s) FLUOROSCOPY TIME:  Fluoroscopy Time: 3 minutes 36 seconds with 7 exposures COMPLICATIONS: None immediate. PROCEDURE: Informed written consent was obtained from the patient after a thorough discussion of the procedural risks, benefits and alternatives. All questions were addressed. Maximal Sterile Barrier Technique was utilized including caps, mask, sterile gowns, sterile gloves, sterile drape, hand hygiene and skin antiseptic. A timeout was performed prior to the initiation of the procedure. The patient was placed prone on the exam table. The bilateral flanks were prepped and draped in the standard sterile fashion. Attention was first turned to the left side. Ultrasound was used to evaluate the left kidney, which demonstrated moderate to severe hydronephrosis. Skin entry site was marked, and local analgesia was obtained with 1% lidocaine. Using ultrasound guidance, an appropriate lower pole posterior calyx was punctured using a 21-gauge Chiba needle. Entry into the collecting system was confirmed with return of urine, and gentle injection of contrast material under fluoroscopy opacifying the proximal collecting system. An 018 wire was advanced through the needle into the renal pelvis and proximal ureter, followed by placement of a transition dilator. An antegrade nephrostogram was then performed, which demonstrated moderate to severe hydronephrosis. Over an 035 Amplatz wire, the percutaneous tract was serially dilated, and a 10.2 French nephrostomy tube was advanced with the loop formed in the renal pelvis. Appropriate positioning of the nephrostomy tube was confirmed with injection of contrast material. The nephrostomy tube was secured to skin using  silk suture and a dressing. It was attached to bag drainage. Attention was then turned to the contralateral right side. The patient was placed prone on the exam table. The right flank was prepped and draped in the standard sterile fashion. Ultrasound was used to evaluate the side kidney, which demonstrated moderate to severe hydronephrosis. Skin entry site was marked, and local analgesia was obtained with 1% lidocaine. Using ultrasound guidance, an appropriate lower pole posterior calyx was punctured using a 21-gauge Chiba needle. Entry into the collecting system was confirmed with return of urine, and gentle injection of contrast material under fluoroscopy opacifying the proximal collecting system. An 018 wire was advanced through the  needle into the renal pelvis and proximal ureter, followed by placement of a transition dilator. An antegrade nephrostogram was then performed, which demonstrated moderate to severe hydronephrosis. Over an 035 Amplatz wire, the percutaneous tract was serially dilated, and a 10.2 French nephrostomy tube was advanced with the loop formed in the renal pelvis. Appropriate positioning of the nephrostomy tube was confirmed with injection of contrast material. The nephrostomy tube was secured to skin using silk suture and a dressing. It was attached to bag drainage. The patient tolerated the procedure well without immediate complication, and was transferred to recovery in stable condition. IMPRESSION: 1. Successful placement of bilateral 10.2 French percutaneous nephrostomy tubes using ultrasound and fluoroscopic guidance for treatment of obstructive uropathy secondary to bladder mass. 2. Nephrostomy tubes placed to bag drainage. The patient will return to Interventional Radiology in approximately 4-6 weeks for routine exchange pending further operative plans. Electronically Signed   By: Albin Felling M.D.   On: 03/30/2021 13:42     Assessment/Plan:  AKI/CKD stage IIIb - due to  obstructive uropathy with 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  S/p bilateral nephrostomy tubes.  Hopefully will see improvement of his BUN/Cr.  No indication for dialysis at this time.   Will continue to follow UOP and Scr. Obstructive uropathy with severe bilateral hydronephrosis - large posterior bladder obstructing both distal ureters and UVJ's concerning for urothelial carcinoma.  Urology has been consulted.  S/p bilateral nephrostomy tubes. COPD - has chronic SOB mostly upon exertion for the past few months.  On inhalers and referred to Pulmonary rehab. Hyponatremia - due to AKI.  Will follow with resolution of obstruction. Anion gap metabolic acidosis - likely due to AKI.  Agree with bicarb and follow. Anemia of CKD stage IIIb - Hgb was 13.6 in July.  Will check iron stores and follow.  Hold off on ESA due to malignancy recurrence. HTN - stable, continue with home meds.  Not on ACE or ARB.   Governor Rooks Moo Gravley 03/30/2021, 2:12 PM

## 2021-03-30 NOTE — H&P (Signed)
History and Physical    Kohl Polinsky BPZ:[id number] DOB: July 23, 1950 DOA: 03/29/2021  PCP: Dorothyann Peng, NP Consultants:  [facility] - urology; [facility] - pulmonology; [facility] - nephrology  Patient coming from:  Home - lives with wife; Donald Prose: Wife, [phone]  Chief Complaint: Abnormal lab  HPI: Hassel Uphoff is a 70 y.o. male with medical history significant of bladder cancer; COPD;  depression/anxiety; stage 3 CKD; HTN; and HLD presenting with hematuria/renal failure on outpatient labs.  He has been feeling tired and had hematuria a few days ago.  He is noticing increased SOB.  He got disoriented and had to sit and rest for a while last weekend and then again maybe Tuesday.  He went to pulmonology yesterday and she did blood work and a CXR and called last night and told them to come to the ER overnight.  +polyuria.    He was diagnosed with bladder cancer earlier this year.  He was successfully treated with BCG therapy x 6 infusions.  He had a f/u cystoscopy in August and "he said it was good."    ED Course: h/o bladder cancer, treated by urology.  Thought to be in remission, found to have incidental elevated BUN/Creatinine.  +urinary frequency.  Acute renal failure.  CT with ?obstructive uropathy.  [place] to see, likely to need B nephrostomy tubes.  ?UTI - given Rocephin.  IR and nephrology also on board.  Review of Systems: As per HPI; otherwise review of systems reviewed and negative.   Ambulatory Status:  Ambulates without assistance  COVID Vaccine Status:  None  Past Medical History:  Diagnosis Date   Anxiety    Bladder cancer [facility])    urologist--- dr Tresa Moore   BPH with obstruction/lower urinary tract symptoms    CKD (chronic kidney disease), stage III ([place])    Congenital deformity of left hand    COPD (chronic obstructive pulmonary disease) ([place])    followed by pcp   Depression    Essential hypertension    followed by pcp   History of adenomatous polyp  of colon    Hyperlipidemia    Renal cysts, acquired, bilateral    Wears glasses     Past Surgical History:  Procedure Laterality Date   COLONOSCOPY WITH PROPOFOL  last one 02-04-2017  dr Henrene Pastor   CYSTOSCOPY W/ RETROGRADES Bilateral 09/15/2020   Procedure: CYSTOSCOPY WITH RETROGRADE PYELOGRAM;  Surgeon: Alexis Frock, MD;  Location: [facility];  Service: Urology;  Laterality: Bilateral;   IR NEPHROSTOMY PLACEMENT LEFT  03/30/2021   NO PAST SURGERIES     TRANSURETHRAL RESECTION OF BLADDER TUMOR N/A 09/15/2020   Procedure: BLADDER BIOPSY FULGERATION;  Surgeon: Alexis Frock, MD;  Location: [facility];  Service: Urology;  Laterality: N/A;  42 MINS    Social History   Socioeconomic History   Marital status: Married    Spouse name: Not on file   Number of children: Not on file   Years of education: Not on file   Highest education level: Not on file  Occupational History   Occupation: retired  Tobacco Use   Smoking status: Former    Years: 58.00    Types: Cigarettes    Quit date: 01/16/2017    Years since quitting: 4.2   Smokeless tobacco: Never  Vaping Use   Vaping Use: Never used  Substance and Sexual Activity   Alcohol use: No   Drug use: Never   Sexual activity: Not on file  Other Topics  Concern   Not on file  Social History Narrative   Retired    Married       He watches television   Social Determinants of Health   Financial Resource Strain: Medium Risk   Difficulty of Paying Living Expenses: Somewhat hard  Food Insecurity: No Food Insecurity   Worried About Charity fundraiser in the Last Year: Never true   Arboriculturist in the Last Year: Never true  Transportation Needs: No Transportation Needs   Lack of Transportation (Medical): No   Lack of Transportation (Non-Medical): No  Physical Activity: Inactive   Days of Exercise per Week: 0 days   Minutes of Exercise per Session: 0 min  Stress: No Stress Concern Present   Feeling  of Stress : Not at all  Social Connections: Moderately Isolated   Frequency of Communication with Friends and Family: More than three times a week   Frequency of Social Gatherings with Friends and Family: More than three times a week   Attends Religious Services: Never   Marine scientist or Organizations: No   Attends Music therapist: Never   Marital Status: Married  Human resources officer Violence: Not At Risk   Fear of Current or Ex-Partner: No   Emotionally Abused: No   Physically Abused: No   Sexually Abused: No    Allergies  Allergen Reactions   Penicillins Nausea And Vomiting    Family History  Problem Relation Age of Onset   Hypertension Mother    Stroke Mother    Heart disease Father    Breast cancer Sister    Prostate cancer Brother    Hypertension Sister    Colon cancer Neg Hx    Esophageal cancer Neg Hx    Pancreatic cancer Neg Hx    Rectal cancer Neg Hx    Stomach cancer Neg Hx     Prior to Admission medications   Medication Sig Start Date End Date Taking? Authorizing Provider  acetaminophen (TYLENOL) 500 MG tablet Take 500 mg by mouth every 6 (six) hours as needed. Patient not taking: Reported on 03/29/2021    [provider]  albuterol (PROVENTIL) (2.5 MG/3ML) 0.083% nebulizer solution Take 3 mLs (2.5 mg total) by nebulization every 6 (six) hours as needed for wheezing or shortness of breath. Patient not taking: Reported on 03/29/2021 11/08/20   Freddi Starr, MD  albuterol (VENTOLIN HFA) 108 (90 Base) MCG/ACT inhaler Inhale 2 puffs into the lungs every 6 (six) hours as needed for wheezing or shortness of breath. 11/08/20   Freddi Starr, MD  amLODipine (NORVASC) 10 MG tablet Take 10 mg by mouth daily. 06/05/20   [provider]  atorvastatin (LIPITOR) 40 MG tablet Take 1 tablet (40 mg total) by mouth daily. Take 1 tab daily 11/21/20   [name], [name], NP  Budeson-Glycopyrrol-Formoterol (BREZTRI AEROSPHERE) 160-9-4.8  MCG/ACT AERO Inhale 2 puffs into the lungs in the morning and at bedtime. 03/27/21   [name], [name], NP  Budeson-Glycopyrrol-Formoterol (BREZTRI AEROSPHERE) 160-9-4.8 MCG/ACT AERO Use as directed.  VFI#[id number] c00 Exp 1/25  4 boxes Patient not taking: Reported on 03/29/2021 03/27/21   Dorothyann Peng, NP  Cholecalciferol (VITAMIN D3) 25 MCG (1000 UT) CAPS Take 1 capsule by mouth daily. Patient not taking: Reported on 03/29/2021    [provider]  citalopram (CELEXA) 20 MG tablet Take 1 tablet (20 mg total) by mouth daily. 03/27/21   [name], [name], NP  finasteride (PROSCAR) 5 MG tablet Take 5  mg by mouth daily. 05/15/20   Alexis Frock, MD  metoprolol succinate (TOPROL-XL) 25 MG 24 hr tablet Take 25 mg by mouth daily.    [provider]  tamsulosin (FLOMAX) 0.4 MG CAPS capsule Take 2 capsules by mouth at bedtime. 04/21/20   [provider]  vitamin B-12 (CYANOCOBALAMIN) 500 MCG tablet Take 1,000 mcg by mouth daily.    [provider]    Physical Exam: Vitals:   03/30/21 1305 03/30/21 1310 03/30/21 1400 03/30/21 1535  BP: (!) 145/75 138/85 102/76   Pulse: 85 82 78   Resp: 17 19 15    Temp:      TempSrc:      SpO2: 100% 100% 100%   Weight:    67.9 kg  Height:    5\' 3"  (1.6 m)     General:  Appears calm and comfortable and is in NAD Eyes:  PERRL, EOMI, normal lids, iris ENT:  grossly normal hearing, lips & tongue, mmm; poor dentition Neck:  no LAD, masses or thyromegaly Cardiovascular:  RRR, no m/r/g. No LE edema.  Respiratory:   CTA bilaterally with no wheezes/rales/rhonchi.  Normal respiratory effort. Abdomen:  soft, NT, mild lower abdominal distention Back:   normal alignment, no CVAT Skin:  no rash or induration seen on limited exam Musculoskeletal:  grossly normal tone BUE/BLE, good ROM, no bony abnormality Psychiatric:  blunted mood and affect, speech fluent and appropriate, AOx3 Neurologic:  CN 2-12 grossly intact, moves all extremities in  coordinated fashion    Radiological Exams on Admission: Independently reviewed - see discussion in A/P where applicable  DG Chest 2 View  Result Date: 03/29/2021 CLINICAL DATA:  Shortness of breath EXAM: CHEST - 2 VIEW COMPARISON:  Chest x-ray dated June 08, 2020 FINDINGS: Cardiac and mediastinal contours are unchanged. Bilateral linear opacities, likely related to underlying emphysema. No new parenchymal process. No large pleural effusion or evidence of pneumothorax. IMPRESSION: Chronic findings related to emphysema.  No focal opacity. Electronically Signed   By: Yetta Glassman M.D.   On: 03/29/2021 12:01   CT Renal Stone Study  Result Date: 03/30/2021 CLINICAL DATA:  70 year old male with gross hematuria. Acute renal insufficiency. EXAM: CT ABDOMEN AND PELVIS WITHOUT CONTRAST TECHNIQUE: Multidetector CT imaging of the abdomen and pelvis was performed following the standard protocol without IV contrast. COMPARISON:  Chest CT 09/27/2020. FINDINGS: Lower chest: Emphysema. Stable lung bases, mild scarring. No cardiomegaly, pericardial effusion or pleural effusion. Hepatobiliary: Negative noncontrast liver and gallbladder. Pancreas: Negative. Spleen: Negative. Adrenals/Urinary Tract: Normal adrenal glands. Severe bilateral hydronephrosis and hydroureter. A degree of chronic atrophy of the left kidney is suspected. There are superimposed exophytic but probable benign bilateral renal cysts. Tortuous ureters remain dilated to the ureterovesical junctions, where a fungating, lobulated intermediate density posterior bladder mass is identified. See series 3, image 65 and coronal image 88. The mass is eccentric to the left, but appears to extend across midline posteriorly (series 3, image 67) and encompasses 27 by 50 by 65 mm (AP by transverse by CC). Bilateral distal hydroureter is 13-14 mm diameter. Relatively normal bladder volume. Note also superimposed posterior bladder fundus nodularity (series 7,  image 68), although might be trabeculation. Stomach/Bowel: Severe diverticulosis of the sigmoid colon, but no active inflammation. Redundant large bowel with mild retained stool throughout. Normal retrocecal appendix. Decompressed and negative terminal ileum. No dilated small bowel. Decompressed stomach. Negative duodenum. No free air or free fluid. Vascular/Lymphatic: Extensive Aortoiliac calcified atherosclerosis. Normal caliber abdominal aorta. Maximal to  mildly enlarged left retroperitoneal lymph node at the pelvic inlet series 3, image 46, 11 mm short axis. Right greater than left iliac Chain pelvic lymphadenopathy, up to 17 mm short axis on the right. Reproductive: Negative aside from urinary bladder abnormality. Other: No pelvic free fluid. Musculoskeletal: Degeneration in the spine. No acute osseous abnormality identified. IMPRESSION: 1. Large posterior bladder obstructing both distal ureters at the UVJs. 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 Associated pelvic and retroperitoneal lymphadenopathy highly suspicious for nodal metastatic disease. And note also additional nodularity of the posterior bladder fundus. 2. Severe bilateral hydronephrosis and hydroureter due to #1. 3. Aortic Atherosclerosis (ICD10-I70.0) and Emphysema (ICD10-J43.9). Electronically Signed   By: Genevie Ann M.D.   On: 03/30/2021 07:24   IR NEPHROSTOMY PLACEMENT LEFT  Result Date: 03/30/2021 INDICATION: Bladder mass with bilateral obstructive uropathy and acute on chronic kidney injury EXAM: 1. Ultrasound-guided puncture of a left renal calyx 2. Left antegrade nephrostogram through percutaneous access 3. Placement of a left percutaneous nephrostomy tube using fluoroscopic guidance 4. Ultrasound-guided puncture of a right renal calyx 5. Right antegrade nephrostogram to percutaneous access 6. Placement of a right percutaneous nephrostomy tube using fluoroscopic guidance COMPARISON:  None. MEDICATIONS: The patient received a single  dose of Rocephin prior to arriving to the IR suite. ANESTHESIA/SEDATION: Fentanyl 2 mcg IV; Versed 75 mg IV Moderate Sedation Time:  68 The patient was continuously monitored during the procedure by the interventional radiology nurse under my direct supervision. CONTRAST:  20 mL-administered into the collecting system(s) FLUOROSCOPY TIME:  Fluoroscopy Time: 3 minutes 36 seconds with 7 exposures COMPLICATIONS: None immediate. PROCEDURE: Informed written consent was obtained from the patient after a thorough discussion of the procedural risks, benefits and alternatives. All questions were addressed. Maximal Sterile Barrier Technique was utilized including caps, mask, sterile gowns, sterile gloves, sterile drape, hand hygiene and skin antiseptic. A timeout was performed prior to the initiation of the procedure. The patient was placed prone on the exam table. The bilateral flanks were prepped and draped in the standard sterile fashion. Attention was first turned to the left side. Ultrasound was used to evaluate the left kidney, which demonstrated moderate to severe hydronephrosis. Skin entry site was marked, and local analgesia was obtained with 1% lidocaine. Using ultrasound guidance, an appropriate lower pole posterior calyx was punctured using a 21-gauge Chiba needle. Entry into the collecting system was confirmed with return of urine, and gentle injection of contrast material under fluoroscopy opacifying the proximal collecting system. An 018 wire was advanced through the needle into the renal pelvis and proximal ureter, followed by placement of a transition dilator. An antegrade nephrostogram was then performed, which demonstrated moderate to severe hydronephrosis. Over an 035 Amplatz wire, the percutaneous tract was serially dilated, and a 10.2 French nephrostomy tube was advanced with the loop formed in the renal pelvis. Appropriate positioning of the nephrostomy tube was confirmed with injection of contrast  material. The nephrostomy tube was secured to skin using silk suture and a dressing. It was attached to bag drainage. Attention was then turned to the contralateral right side. The patient was placed prone on the exam table. The right flank was prepped and draped in the standard sterile fashion. Ultrasound was used to evaluate the side kidney, which demonstrated moderate to severe hydronephrosis. Skin entry site was marked, and local analgesia was obtained with 1% lidocaine. Using ultrasound guidance, an appropriate lower pole posterior calyx was punctured using a 21-gauge Chiba needle. Entry into the  collecting system was confirmed with return of urine, and gentle injection of contrast material under fluoroscopy opacifying the proximal collecting system. An 018 wire was advanced through the needle into the renal pelvis and proximal ureter, followed by placement of a transition dilator. An antegrade nephrostogram was then performed, which demonstrated moderate to severe hydronephrosis. Over an 035 Amplatz wire, the percutaneous tract was serially dilated, and a 10.2 French nephrostomy tube was advanced with the loop formed in the renal pelvis. Appropriate positioning of the nephrostomy tube was confirmed with injection of contrast material. The nephrostomy tube was secured to skin using silk suture and a dressing. It was attached to bag drainage. The patient tolerated the procedure well without immediate complication, and was transferred to recovery in stable condition. IMPRESSION: 1. Successful placement of bilateral 10.2 French percutaneous nephrostomy tubes using ultrasound and fluoroscopic guidance for treatment of obstructive uropathy secondary to bladder mass. 2. Nephrostomy tubes placed to bag drainage. The patient will return to Interventional Radiology in approximately 4-6 weeks for routine exchange pending further operative plans. Electronically Signed   By: Albin Felling M.D.   On: 03/30/2021 13:42     EKG: not done   Labs on Admission: I have personally reviewed the available labs and imaging studies at the time of the admission.  Pertinent labs:   Glucose 110 BUN 95/Creatinine 6.06/GFR 9; 42/2.1 on 09/15/20 HS troponin 5 BNP 61 WBC 10.4 Hgb 9.7 Platelets 408 UA: 50 glucose, moderate Hgb, large LE, 30 protein, rare bacteria, >50 WBC   Assessment/Plan Principal Problem:   Lower obstructive uropathy Active Problems:   Essential hypertension   Hyperlipidemia   COPD (chronic obstructive pulmonary disease) ([facility])   Acute renal failure superimposed on stage 3b chronic kidney disease ([facility])   Bladder cancer ([facility])   Acute renal failure due to obstructive uropathy -Baseline creatinine is 2.1, GFR 30.   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 Outpatient - but likely outpatient now that obstruction is cleared with nephrostomy tubes -Dr. Alyson Ingles to consult  COPD -Continue Albuterol, Breztri  Depression/anxiety -Continue Celexa  HTN -Continue Norvasc, Toprol XL  HLD -Continue Lipitor     Note: This patient has been tested and is negative for the novel coronavirus COVID-19. The patient has been fully vaccinated against COVID-19.   Level of care: Med-Surg DVT prophylaxis: SCDs Code Status:  DNR - confirmed with patient/family Family Communication: Wife  was present throughout evaluation Disposition Plan:  The patient is from: home  Anticipated d/c is to: home without [facility] services   Anticipated d/c date will depend on clinical response to treatment, likely 2-3 days  Patient is currently: acutely ill Consults called: Urology; IR; nephrology  Admission status:  Admit - It is my clinical opinion that admission to INPATIENT is reasonable and necessary because of the expectation that this patient will require hospital care that crosses at least 2 midnights to treat this condition based on the  medical complexity of the problems presented.  Given the aforementioned information, the predictability of an adverse outcome is felt to be significant.    Karmen Bongo MD [facility]   How to contact the [facility] Attending or Consulting provider [place] or covering provider during after hours [place], for this patient?  Check the care team in [facility] and look for a) attending/consulting [facility] provider listed and b) the [facility] team listed Log into [email or web address] and use [place] universal password to access. If you do not have the password, please contact the hospital operator. Locate the [facility] provider you are looking for under [facility] and page to a number that you can be directly reached. If you still have difficulty reaching the provider, please page the [facility] (Director on Call) for the Hospitalists listed on amion for assistance.   03/30/2021, 3:54 PM

## 2021-03-30 NOTE — ED Provider Notes (Signed)
[facility] EMERGENCY DEPARTMENT Provider Note   CSN: [id number] Arrival date & time: 03/29/21  1817     History Chief Complaint  Patient presents with   Hematuria    Samuel [name] is a 70 y.o. male.  Patient with history of COPD on home oxygen, CKD, BPH, previous bladder cancer status post treatment sent by pulmonologist today with abnormal labs.  He was being seen for recheck of his COPD and a recent episode of near syncope.  Several days ago had episode of hot flashes with nausea and has had down for few minutes before resolving.  Never did have chest pain.  He states his breathing is at baseline. Labs today he was told by his pulmonologist that his kidney function was significantly elevated and he needed to come to the ED.  He has had intermittent blood in the urine for the past several days but no flank pain or abdominal pain.  Believes he is urinating normally.  Does have some incontinence from time to time which is unchanged. Denies any flank pain.  Denies abdominal pain.  No history of kidney stones.  No recent medication changes.  The history is provided by the patient and the spouse.  Hematuria Associated symptoms include shortness of breath. Pertinent negatives include no chest pain and no abdominal pain.      Past Medical History:  Diagnosis Date   Anxiety    Bladder cancer [facility])    urologist--- dr Tresa Moore   BPH with obstruction/lower urinary tract symptoms    CKD (chronic kidney disease), stage III ([place])    Congenital deformity of left hand    COPD (chronic obstructive pulmonary disease) ([place])    followed by pcp   Depression    DOE (dyspnea on exertion)    Essential hypertension    followed by pcp   Feeling of incomplete bladder emptying    Hematuria    History of adenomatous polyp of colon    Hyperlipidemia    Renal cysts, acquired, bilateral    Wears glasses     Patient Active Problem List   Diagnosis Date Noted    Lightheadedness 03/29/2021   Centrilobular emphysema (HCC)    COPD (chronic obstructive pulmonary disease) (HCC)    Nocturnal hypoxemia due to emphysema (HCC)    Shortness of breath on exertion    Hyperlipidemia 12/19/2017   Depression    Essential hypertension 11/15/2016   Tobacco use 11/15/2016   BPH without urinary obstruction 11/15/2016    Past Surgical History:  Procedure Laterality Date   COLONOSCOPY WITH PROPOFOL  last one 02-04-2017  dr Henrene Pastor   CYSTOSCOPY W/ RETROGRADES Bilateral 09/15/2020   Procedure: CYSTOSCOPY WITH RETROGRADE PYELOGRAM;  Surgeon: Alexis Frock, MD;  Location: [facility];  Service: Urology;  Laterality: Bilateral;   NO PAST SURGERIES     TRANSURETHRAL RESECTION OF BLADDER TUMOR N/A 09/15/2020   Procedure: BLADDER BIOPSY FULGERATION;  Surgeon: Alexis Frock, MD;  Location: [facility];  Service: Urology;  Laterality: N/A;  77 MINS       Family History  Problem Relation Age of Onset   Hypertension Mother    Stroke Mother    Heart disease Father    Breast cancer Sister    Prostate cancer Brother    Hypertension Sister    Colon cancer Neg Hx    Esophageal cancer Neg Hx    Pancreatic cancer Neg Hx    Rectal cancer Neg Hx  Stomach cancer Neg Hx     Social History   Tobacco Use   Smoking status: Former    Years: 58.00    Types: Cigarettes    Quit date: 01/16/2017    Years since quitting: 4.2   Smokeless tobacco: Never  Vaping Use   Vaping Use: Never used  Substance Use Topics   Alcohol use: No   Drug use: Never    Home Medications Prior to Admission medications   Medication Sig Start Date End Date Taking? Authorizing Provider  acetaminophen (TYLENOL) 500 MG tablet Take 500 mg by mouth every 6 (six) hours as needed. Patient not taking: Reported on 03/29/2021    [provider]  albuterol (PROVENTIL) (2.5 MG/3ML) 0.083% nebulizer solution Take 3 mLs (2.5 mg total) by nebulization every 6 (six) hours  as needed for wheezing or shortness of breath. Patient not taking: Reported on 03/29/2021 11/08/20   Freddi Starr, MD  albuterol (VENTOLIN HFA) 108 (90 Base) MCG/ACT inhaler Inhale 2 puffs into the lungs every 6 (six) hours as needed for wheezing or shortness of breath. 11/08/20   Freddi Starr, MD  amLODipine (NORVASC) 10 MG tablet Take 10 mg by mouth daily. 06/05/20   [provider]  atorvastatin (LIPITOR) 40 MG tablet Take 1 tablet (40 mg total) by mouth daily. Take 1 tab daily 11/21/20   [name], [name], NP  Budeson-Glycopyrrol-Formoterol (BREZTRI AEROSPHERE) 160-9-4.8 MCG/ACT AERO Inhale 2 puffs into the lungs in the morning and at bedtime. 03/27/21   [name], [name], NP  Budeson-Glycopyrrol-Formoterol (BREZTRI AEROSPHERE) 160-9-4.8 MCG/ACT AERO Use as directed.  JIR#[id number] c00 Exp 1/25  4 boxes Patient not taking: Reported on 03/29/2021 03/27/21   Dorothyann Peng, NP  Cholecalciferol (VITAMIN D3) 25 MCG (1000 UT) CAPS Take 1 capsule by mouth daily. Patient not taking: Reported on 03/29/2021    [provider]  citalopram (CELEXA) 20 MG tablet Take 1 tablet (20 mg total) by mouth daily. 03/27/21   [name], [name], NP  finasteride (PROSCAR) 5 MG tablet Take 5 mg by mouth daily. 05/15/20   Alexis Frock, MD  metoprolol succinate (TOPROL-XL) 25 MG 24 hr tablet Take 25 mg by mouth daily.    [provider]  tamsulosin (FLOMAX) 0.4 MG CAPS capsule Take 2 capsules by mouth at bedtime. 04/21/20   [provider]  vitamin B-12 (CYANOCOBALAMIN) 500 MCG tablet Take 1,000 mcg by mouth daily.    [provider]    Allergies    Penicillins  Review of Systems   Review of Systems  Constitutional:  Negative for activity change, appetite change and fever.  HENT:  Negative for congestion.   Respiratory:  Positive for shortness of breath. Negative for cough and chest tightness.   Cardiovascular:  Negative for chest pain and leg swelling.  Gastrointestinal:   Negative for abdominal pain, nausea and vomiting.  Genitourinary:  Positive for decreased urine volume and hematuria. Negative for difficulty urinating, dysuria, flank pain, frequency, penile pain and urgency.  Musculoskeletal:  Negative for arthralgias, back pain, myalgias and neck pain.  Neurological:  Positive for dizziness, weakness and light-headedness.   all other systems are negative except as noted in the HPI and PMH.   Physical Exam Updated Vital Signs BP 129/76 (BP Location: Right Arm)   Pulse 91   Temp 97.9 F (36.6 C) (Oral)   Resp 17   SpO2 99%   Physical Exam Vitals and nursing note reviewed.  Constitutional:      General: He is not  in acute distress.    Appearance: He is well-developed.  HENT:     Head: Normocephalic and atraumatic.     Mouth/Throat:     Pharynx: No oropharyngeal exudate.  Eyes:     Conjunctiva/sclera: Conjunctivae normal.     Pupils: Pupils are equal, round, and reactive to light.  Neck:     Comments: No meningismus. Cardiovascular:     Rate and Rhythm: Normal rate and regular rhythm.     Heart sounds: Normal heart sounds. No murmur heard. Pulmonary:     Effort: Pulmonary effort is normal. No respiratory distress.     Comments: Diminished breath sounds at bases Abdominal:     Palpations: Abdomen is soft.     Tenderness: There is abdominal tenderness. There is no guarding or rebound.     Comments: Suprapubic tenderness  Genitourinary:    Comments: No testicular tenderness, no appreciable hernia Musculoskeletal:        General: No tenderness. Normal range of motion.     Cervical back: Normal range of motion and neck supple.     Comments: No CVAT  Skin:    General: Skin is warm.  Neurological:     Mental Status: He is alert and oriented to person, place, and time.     Cranial Nerves: No cranial nerve deficit.     Motor: No abnormal muscle tone.     Coordination: Coordination normal.     Comments:  5/5 strength throughout. CN 2-12  intact.Equal grip strength.   Psychiatric:        Behavior: Behavior normal.    ED Results / Procedures / Treatments   Labs (all labs ordered are listed, but only abnormal results are displayed) Labs Reviewed  CBC WITH DIFFERENTIAL/PLATELET - Abnormal; Notable for the following components:      Result Value   RBC 3.68 (*)    Hemoglobin 9.7 (*)    HCT 30.3 (*)    RDW 17.2 (*)    Platelets 408 (*)    Neutro Abs 8.3 (*)    All other components within normal limits  BASIC METABOLIC PANEL - Abnormal; Notable for the following components:   Sodium 134 (*)    CO2 14 (*)    Glucose, Bld 110 (*)    BUN 95 (*)    Creatinine, Ser 6.06 (*)    Calcium 8.8 (*)    GFR, Estimated 9 (*)    All other components within normal limits  URINALYSIS, ROUTINE W REFLEX MICROSCOPIC - Abnormal; Notable for the following components:   APPearance HAZY (*)    Glucose, UA 50 (*)    Hgb urine dipstick MODERATE (*)    Protein, ur 30 (*)    Leukocytes,Ua LARGE (*)    WBC, UA >50 (*)    Bacteria, UA RARE (*)    All other components within normal limits  TROPONIN I (HIGH SENSITIVITY)    EKG None  Radiology DG Chest 2 View  Result Date: 03/29/2021 CLINICAL DATA:  Shortness of breath EXAM: CHEST - 2 VIEW COMPARISON:  Chest x-ray dated June 08, 2020 FINDINGS: Cardiac and mediastinal contours are unchanged. Bilateral linear opacities, likely related to underlying emphysema. No new parenchymal process. No large pleural effusion or evidence of pneumothorax. IMPRESSION: Chronic findings related to emphysema.  No focal opacity. Electronically Signed   By: Yetta Glassman M.D.   On: 03/29/2021 12:01    Procedures Procedures   Medications Ordered in ED Medications  sodium chloride 0.9 % bolus  1,000 mL (has no administration in time range)    ED Course  I have reviewed the triage vital signs and the nursing notes.  Pertinent labs & imaging results that were available during my care of the patient were  reviewed by me and considered in my medical decision making (see chart for details).    MDM Rules/Calculators/A&P                           Acute on chronic renal failure with hematuria.  Vital stable, no distress.    Creatinine elevated 6 from baseline of 2.  He denies any flank pain or abdominal pain. Bladder scan is 91 without evidence of urinary retention.  Will hydrate gently, obtain imaging to rule out obstruction. Patient prefers not to have catheter if can be avoided  CT pending at shift change.  Patient will need admission for acute on chronic renal failure of uncertain etiology.  UA shows hematuria without infection.  No evidence of significant urinary retention.  Dr. Johnney Killian to assume care.  Final Clinical Impression(s) / ED Diagnoses Final diagnoses:  None    Rx / DC Orders ED Discharge Orders     None        Dovber Ernest, Annie Main, MD 03/30/21 [phone]

## 2021-03-30 NOTE — ED Notes (Signed)
Pt transported to IR 

## 2021-03-30 NOTE — ED Notes (Signed)
Pt ambulatory to bathroom

## 2021-03-30 NOTE — ED Provider Notes (Addendum)
Admitted for AKI.  Follow-up CT abdomen.  Patient denies he has any abdominal pain or back pain.  He reports he has had urinary frequency.  Finding of elevated BUN and creatinine was incidental on outpatient lab work. Physical Exam  BP 129/76 (BP Location: Right Arm)   Pulse 91   Temp 97.9 F (36.6 C) (Oral)   Resp 17   SpO2 99%   Physical Exam Patient is alert and nontoxic.  He appears mildly short of breath at rest.  He has been up and ambulatory in the emergency department. ED Course/Procedures     Procedures  MDM  CT scan returns with significant mass in the bladder and hydronephrosis.  Will consult urology.  Patient has acute renal failure which appears likely obstructive in light of CT findings.  Will consult nephrology.  Urinalysis does show white cells with leukoesterase.  Will empirically treat with Rocephin and culture urine.  Will call for admission pending urology request for which hospital preferred.  Consult: Reviewed with urology, Dr. Alyson Ingles.  Request patient be kept n.p.o. and coags added.  Request consult to interventional radiology for anticipated placement of nephrostomy tubes.  Consult: Reviewed with Dr. Kathe Mariner nephrology.  Will see the patient in consultation.  Consult: Reviewed with interventional radiologist.  He will review case directly with Dr. Alyson Ingles for strategic planning.  Consult: Reviewed Dr. Lorin [facility] hospitalist for admission.       Charlesetta Shanks, MD 03/30/21 6060    Charlesetta Shanks, MD 03/30/21 0459    Charlesetta Shanks, MD 03/30/21 1000    Charlesetta Shanks, MD 03/30/21 1030

## 2021-03-30 NOTE — Consult Note (Signed)
Urology Consult  Referring physician: Dr. Johnney Killian Reason for referral: gross hematuria, bilateral hydronephrosis  Chief Complaint: gross hematuria  History of Present Illness: Samuel [name] is a 70yo with a history of bladder cancer admitted with gross hematuria and fatigue. 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 He had his last surveillance cystoscopy 02/2021. He developed gross painless hematuria 5 days ago and then developed worsening fatigue which prompted his presentation to the ER. His creatinine was 6 from a baseline of 1.8. He has noted decreased urine output over the past week. He denies any worsening urinary urgency, frequency, dysuria. CT abd/pelvis obtained the ER showed severe bilateral hydronephrosis to the level of a large bladder mass.  IR was consulted and bilateral nephrostomy tubes were placed this afternoon. Urine from the nephrostoym tubes is light pink.  Past Medical History:  Diagnosis Date   Anxiety    Bladder cancer [facility])    urologist--- dr Tresa Moore   BPH with obstruction/lower urinary tract symptoms    CKD (chronic kidney disease), stage III ([place])    Congenital deformity of left hand    COPD (chronic obstructive pulmonary disease) ([place])    followed by pcp   Depression    Essential hypertension    followed by pcp   History of adenomatous polyp of colon    Hyperlipidemia    Renal cysts, acquired, bilateral    Wears glasses    Past Surgical History:  Procedure Laterality Date   COLONOSCOPY WITH PROPOFOL  last one 02-04-2017  dr Henrene Pastor   CYSTOSCOPY W/ RETROGRADES Bilateral 09/15/2020   Procedure: CYSTOSCOPY WITH RETROGRADE PYELOGRAM;  Surgeon: Alexis Frock, MD;  Location[facility];  Service: Urology;  Laterality: Bilateral;   IR NEPHROSTOMY PLACEMENT LEFT  03/30/2021   NO PAST SURGERIES     TRANSURETHRAL RESECTION OF BLADDER TUMOR N/A 09/15/2020   Procedure: BLADDER BIOPSY FULGERATION;   Surgeon: Alexis Frock, MD;  Location: [facility];  Service: Urology;  Laterality: N/A;  45 MINS    Medications: I have reviewed the patient's current medications. Allergies:  Allergies  Allergen Reactions   Penicillins Nausea And Vomiting    Family History  Problem Relation Age of Onset   Hypertension Mother    Stroke Mother    Heart disease Father    Breast cancer Sister    Prostate cancer Brother    Hypertension Sister    Colon cancer Neg Hx    Esophageal cancer Neg Hx    Pancreatic cancer Neg Hx    Rectal cancer Neg Hx    Stomach cancer Neg Hx    Social History:  reports that he quit smoking about 4 years ago. His smoking use included cigarettes. He has never used smokeless tobacco. He reports that he does not drink alcohol and does not use drugs.  Review of Systems  Genitourinary:  Positive for hematuria.  All other systems reviewed and are negative.  Physical Exam:  Vital signs in last 24 hours: Temp:  [97.9 F (36.6 C)-98.4 F (36.9 C)] 98.4 F (36.9 C) (11/18 1952) Pulse Rate:  [78-98] 79 (11/18 1952) Resp:  [14-25] 17 (11/18 1952) BP: (102-161)/(65-90) 125/65 (11/18 1952) SpO2:  [93 %-100 %] 98 % (11/18 1952) Weight:  [67.9 kg] 67.9 kg (11/18 1535) Physical Exam Constitutional:      Appearance: Normal appearance.  HENT:     Head: Normocephalic and atraumatic.     Nose: Nose  normal.     Mouth/Throat:     Mouth: Mucous membranes are dry.  Eyes:     Extraocular Movements: Extraocular movements intact.     Pupils: Pupils are equal, round, and reactive to light.  Cardiovascular:     Rate and Rhythm: Normal rate and regular rhythm.  Pulmonary:     Effort: Pulmonary effort is normal. No respiratory distress.  Abdominal:     General: Abdomen is flat. There is no distension.  Genitourinary:    Penis: Normal.      Testes: Normal.  Musculoskeletal:        General: Swelling present. Normal range of motion.     Cervical back: Normal range  of motion and neck supple.  Skin:    General: Skin is warm and dry.  Neurological:     General: No focal deficit present.     Mental Status: He is alert and oriented to person, place, and time.  Psychiatric:        Mood and Affect: Mood normal.        Behavior: Behavior normal.        Thought Content: Thought content normal.        Judgment: Judgment normal.    Laboratory Data:  Results for orders placed or performed during the hospital encounter of 03/29/21 (from the past 72 hour(s))  Urinalysis, Routine w reflex microscopic     Status: Abnormal   Collection Time: 03/29/21  6:43 PM  Result Value Ref Range   Color, Urine YELLOW YELLOW   APPearance HAZY (A) CLEAR   Specific Gravity, Urine 1.009 1.005 - 1.030   pH 6.0 5.0 - 8.0   Glucose, UA 50 (A) NEGATIVE mg/dL   Hgb urine dipstick MODERATE (A) NEGATIVE   Bilirubin Urine NEGATIVE NEGATIVE   Ketones, ur NEGATIVE NEGATIVE mg/dL   Protein, ur 30 (A) NEGATIVE mg/dL   Nitrite NEGATIVE NEGATIVE   Leukocytes,Ua LARGE (A) NEGATIVE   RBC / HPF 21-50 0 - 5 RBC/hpf   WBC, UA >50 (H) 0 - 5 WBC/hpf   Bacteria, UA RARE (A) NONE SEEN   Squamous Epithelial / LPF 0-5 0 - 5   WBC Clumps PRESENT     Comment: Performed at [facility], [address], [place], [place] [address]  CBC with Differential     Status: Abnormal   Collection Time: 03/29/21  6:52 PM  Result Value Ref Range   WBC 10.4 4.0 - 10.5 K/uL   RBC 3.68 (L) 4.22 - 5.81 MIL/uL   Hemoglobin 9.7 (L) 13.0 - 17.0 g/dL   HCT 30.3 (L) 39.0 - 52.0 %   MCV 82.3 80.0 - 100.0 fL   MCH 26.4 26.0 - 34.0 pg   MCHC 32.0 30.0 - 36.0 g/dL   RDW 17.2 (H) 11.5 - 15.5 %   Platelets 408 (H) 150 - 400 K/uL   nRBC 0.0 0.0 - 0.2 %   Neutrophils Relative % 80 %   Neutro Abs 8.3 (H) 1.7 - 7.7 K/uL   Lymphocytes Relative 7 %   Lymphs Abs 0.8 0.7 - 4.0 K/uL   Monocytes Relative 9 %   Monocytes Absolute 1.0 0.1 - 1.0 K/uL   Eosinophils Relative 4 %   Eosinophils Absolute 0.4 0.0 - 0.5 K/uL    Basophils Relative 0 %   Basophils Absolute 0.0 0.0 - 0.1 K/uL   Immature Granulocytes 0 %   Abs Immature Granulocytes 0.03 0.00 - 0.07 K/uL    Comment:  Performed at [facility], [place] [address] [place], [place] [address]  Basic metabolic panel     Status: Abnormal   Collection Time: 03/29/21  6:52 PM  Result Value Ref Range   Sodium 134 (L) 135 - 145 mmol/L   Potassium 4.8 3.5 - 5.1 mmol/L   Chloride 106 98 - 111 mmol/L   CO2 14 (L) 22 - 32 mmol/L   Glucose, Bld 110 (H) 70 - 99 mg/dL    Comment: Glucose reference range applies only to samples taken after fasting for at least 8 hours.   BUN 95 (H) 8 - 23 mg/dL   Creatinine, Ser 6.06 (H) 0.61 - 1.24 mg/dL   Calcium 8.8 (L) 8.9 - 10.3 mg/dL   GFR, Estimated 9 (L) >60 mL/min    Comment: (NOTE) Calculated using the CKD-EPI Creatinine Equation (2021)    Anion gap 14 5 - 15    Comment: Performed at [place] [address], [place], [place] [address]  Troponin I (High Sensitivity)     Status: None   Collection Time: 03/30/21  6:21 AM  Result Value Ref Range   Troponin I (High Sensitivity) 5 <18 ng/L    Comment: (NOTE) Elevated high sensitivity troponin I (hsTnI) values and significant  changes across serial measurements may suggest ACS but many other  chronic and acute conditions are known to elevate hsTnI results.  Refer to the "Links" section for chest pain algorithms and additional  guidance. Performed at [facility], [place] [address], [place], [place] [address]   Protime-INR     Status: None   Collection Time: 03/30/21  9:25 AM  Result Value Ref Range   Prothrombin Time 14.4 11.4 - 15.2 seconds   INR 1.1 0.8 - 1.2    Comment: (NOTE) INR goal varies based on device and disease states. Performed at [facility], [place] [address] [place],  [address]    No results found for this or any previous visit (from the past 240 hour(s)). Creatinine: Recent Labs    03/29/21 1142 03/29/21 1852   CREATININE 5.90* 6.06*   Baseline Creatinine: 1.8  Impression/Assessment:  70yo with bladder cancer and bilateral hydronephrosis  Plan:  Bilateral hydronephrosis: I discussed the causes of hydronephrosis with the patient and his daughter. He has a new large bladder mass obstructing both kidneys and will require cystoscopy with resection once his creatinine has normalized.  Bladder tumor: I discussed the management of ladder cancer with the patient and his daughter and we will proceed with bladder tumor resection after creatinine improves. I will discuss the case with Dr. Tresa Moore and we will coordinate his surgery as an outpatient  Nicolette Bang 03/30/2021, 8:56 PM

## 2021-03-31 DIAGNOSIS — N139 Obstructive and reflux uropathy, unspecified: Secondary | ICD-10-CM | POA: Diagnosis not present

## 2021-03-31 DIAGNOSIS — N390 Urinary tract infection, site not specified: Secondary | ICD-10-CM | POA: Diagnosis present

## 2021-03-31 DIAGNOSIS — I10 Essential (primary) hypertension: Secondary | ICD-10-CM

## 2021-03-31 DIAGNOSIS — N1832 Chronic kidney disease, stage 3b: Secondary | ICD-10-CM

## 2021-03-31 DIAGNOSIS — E782 Mixed hyperlipidemia: Secondary | ICD-10-CM

## 2021-03-31 DIAGNOSIS — C679 Malignant neoplasm of bladder, unspecified: Secondary | ICD-10-CM | POA: Diagnosis not present

## 2021-03-31 DIAGNOSIS — N179 Acute kidney failure, unspecified: Principal | ICD-10-CM

## 2021-03-31 DIAGNOSIS — J432 Centrilobular emphysema: Secondary | ICD-10-CM

## 2021-03-31 LAB — CBC
HCT: 27.3 % — ABNORMAL LOW (ref 39.0–52.0)
Hemoglobin: 9 g/dL — ABNORMAL LOW (ref 13.0–17.0)
MCH: 27 pg (ref 26.0–34.0)
MCHC: 33 g/dL (ref 30.0–36.0)
MCV: 82 fL (ref 80.0–100.0)
Platelets: 327 10*3/uL (ref 150–400)
RBC: 3.33 MIL/uL — ABNORMAL LOW (ref 4.22–5.81)
RDW: 17.2 % — ABNORMAL HIGH (ref 11.5–15.5)
WBC: 10.3 10*3/uL (ref 4.0–10.5)
nRBC: 0 % (ref 0.0–0.2)

## 2021-03-31 LAB — RENAL FUNCTION PANEL
Albumin: 3.1 g/dL — ABNORMAL LOW (ref 3.5–5.0)
Anion gap: 10 (ref 5–15)
BUN: 84 mg/dL — ABNORMAL HIGH (ref 8–23)
CO2: 16 mmol/L — ABNORMAL LOW (ref 22–32)
Calcium: 8.9 mg/dL (ref 8.9–10.3)
Chloride: 112 mmol/L — ABNORMAL HIGH (ref 98–111)
Creatinine, Ser: 5.7 mg/dL — ABNORMAL HIGH (ref 0.61–1.24)
GFR, Estimated: 10 mL/min — ABNORMAL LOW (ref >60)
Glucose, Bld: 90 mg/dL (ref 70–99)
Phosphorus: 5.8 mg/dL — ABNORMAL HIGH (ref 2.5–4.6)
Potassium: 4.5 mmol/L (ref 3.5–5.1)
Sodium: 138 mmol/L (ref 135–145)

## 2021-03-31 LAB — IRON AND TIBC
Iron: 59 ug/dL (ref 45–182)
Saturation Ratios: 26 % (ref 17.9–39.5)
TIBC: 231 ug/dL — ABNORMAL LOW (ref 250–450)
UIBC: 172 ug/dL

## 2021-03-31 LAB — RESP PANEL BY RT-PCR (FLU A&B, COVID) ARPGX2
Influenza A by PCR: NEGATIVE
Influenza B by PCR: NEGATIVE
SARS Coronavirus 2 by RT PCR: NEGATIVE

## 2021-03-31 LAB — FERRITIN: Ferritin: 274 ng/mL (ref 24–336)

## 2021-03-31 MED ORDER — SODIUM CHLORIDE 0.9 % IV SOLN
1.0000 g | INTRAVENOUS | Status: DC
  Administered 2021-03-31: 1 g via INTRAVENOUS
  Filled 2021-03-31 (×2): qty 10

## 2021-03-31 MED ORDER — SODIUM BICARBONATE 650 MG PO TABS
650.0000 mg | ORAL_TABLET | Freq: Two times a day (BID) | ORAL | Status: DC
  Administered 2021-03-31 – 2021-04-03 (×6): 650 mg via ORAL
  Filled 2021-03-31 (×6): qty 1

## 2021-03-31 NOTE — Progress Notes (Signed)
Mobility Specialist Progress Note:   03/31/21 1100  Mobility  Activity Ambulated in hall  Level of Assistance Independent  Assistive Device None  Distance Ambulated (ft) 200 ft  Mobility Ambulated independently in hallway  Mobility Response Tolerated fair  Mobility performed by Mobility specialist  Bed Position Chair  $Mobility charge 1 Mobility    Pre Mobility: SpO2 96% During Mobility: SpO2 79% Post Mobility: SpO2 93%  Pt desat to 79% during ambulation. Recovered quickly with a standing rest break. Pt wheezing during amb, states that is baseline. No pain, dizziness or fatigue reported. RN notified of SpO2 readings.   Nelta Numbers Mobility Specialist  Phone [phone]

## 2021-03-31 NOTE — Progress Notes (Signed)
Patient ID: Domanik Rainville, male   DOB: August 05, 1950, 70 y.o.   MRN: [id number] S: no complaints, feels well. O:BP 131/65 (BP Location: Left Arm)   Pulse 83   Temp 98.1 F (36.7 C) (Oral)   Resp 16   Ht 5\' 3"  (1.6 m)   Wt 67.9 kg   SpO2 98%   BMI 26.51 kg/m   Intake/Output Summary (Last 24 hours) at 03/31/2021 1157 Last data filed at 03/31/2021 1140 Gross per 24 hour  Intake 1125.96 ml  Output 4250 ml  Net -3124.04 ml   Intake/Output: I/O last 3 completed shifts: In: 1984 [I.V.:886; IV Piggyback:1098] Out: 3025 [Urine:3025]  Intake/Output this shift:  Total I/O In: 240 [P.O.:240] Out: 1225 [Urine:1225] Weight change:  Gen: NAD CVS: RRR Resp: CTA Back:  bilateral nephrostomy tubes with gross hematuria, output greater on right. Abd: +BS, soft, NT/ND Ext: no edema  Recent Labs  Lab 03/29/21 1142 03/29/21 1852 03/31/21 0104  NA 137 134* 138  K 5.1 4.8 4.5  CL 105 106 112*  CO2 19 14* 16*  GLUCOSE 97 110* 90  BUN 90* 95* 84*  CREATININE 5.90* 6.06* 5.70*  ALBUMIN  --   --  3.1*  CALCIUM 9.5 8.8* 8.9  PHOS  --   --  5.8*   Liver Function Tests: Recent Labs  Lab 03/31/21 0104  ALBUMIN 3.1*   No results for input(s): LIPASE, AMYLASE in the last 168 hours. No results for input(s): AMMONIA in the last 168 hours. CBC: Recent Labs  Lab 03/29/21 1142 03/29/21 1852 03/31/21 0104  WBC 9.2 10.4 10.3  NEUTROABS 7.4 8.3*  --   HGB 10.3* 9.7* 9.0*  HCT 30.7* 30.3* 27.3*  MCV 80.2 82.3 82.0  PLT 444.0* 408* 327   Cardiac Enzymes: No results for input(s): CKTOTAL, CKMB, CKMBINDEX, TROPONINI in the last 168 hours. CBG: No results for input(s): GLUCAP in the last 168 hours.  Iron Studies:  Recent Labs    03/31/21 0104  IRON 59  TIBC 231*  FERRITIN 274   Studies/Results: DG Chest 2 View  Result Date: 03/29/2021 CLINICAL DATA:  Shortness of breath EXAM: CHEST - 2 VIEW COMPARISON:  Chest x-ray dated June 08, 2020 FINDINGS: Cardiac and  mediastinal contours are unchanged. Bilateral linear opacities, likely related to underlying emphysema. No new parenchymal process. No large pleural effusion or evidence of pneumothorax. IMPRESSION: Chronic findings related to emphysema.  No focal opacity. Electronically Signed   By: Yetta Glassman M.D.   On: 03/29/2021 12:01   CT Renal Stone Study  Result Date: 03/30/2021 CLINICAL DATA:  70 year old male with gross hematuria. Acute renal insufficiency. EXAM: CT ABDOMEN AND PELVIS WITHOUT CONTRAST TECHNIQUE: Multidetector CT imaging of the abdomen and pelvis was performed following the standard protocol without IV contrast. COMPARISON:  Chest CT 09/27/2020. FINDINGS: Lower chest: Emphysema. Stable lung bases, mild scarring. No cardiomegaly, pericardial effusion or pleural effusion. Hepatobiliary: Negative noncontrast liver and gallbladder. Pancreas: Negative. Spleen: Negative. Adrenals/Urinary Tract: Normal adrenal glands. Severe bilateral hydronephrosis and hydroureter. A degree of chronic atrophy of the left kidney is suspected. There are superimposed exophytic but probable benign bilateral renal cysts. Tortuous ureters remain dilated to the ureterovesical junctions, where a fungating, lobulated intermediate density posterior bladder mass is identified. See series 3, image 65 and coronal image 88. The mass is eccentric to the left, but appears to extend across midline posteriorly (series 3, image 67) and encompasses 27 by 50 by 65 mm (AP by transverse by CC). Bilateral  distal hydroureter is 13-14 mm diameter. Relatively normal bladder volume. Note also superimposed posterior bladder fundus nodularity (series 7, image 68), although might be trabeculation. Stomach/Bowel: Severe diverticulosis of the sigmoid colon, but no active inflammation. Redundant large bowel with mild retained stool throughout. Normal retrocecal appendix. Decompressed and negative terminal ileum. No dilated small bowel. Decompressed  stomach. Negative duodenum. No free air or free fluid. Vascular/Lymphatic: Extensive Aortoiliac calcified atherosclerosis. Normal caliber abdominal aorta. Maximal to mildly enlarged left retroperitoneal lymph node at the pelvic inlet series 3, image 46, 11 mm short axis. Right greater than left iliac Chain pelvic lymphadenopathy, up to 17 mm short axis on the right. Reproductive: Negative aside from urinary bladder abnormality. Other: No pelvic free fluid. Musculoskeletal: Degeneration in the spine. No acute osseous abnormality identified. IMPRESSION: 1. Large posterior bladder obstructing both distal ureters at the UVJs. 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 Associated pelvic and retroperitoneal lymphadenopathy highly suspicious for nodal metastatic disease. And note also additional nodularity of the posterior bladder fundus. 2. Severe bilateral hydronephrosis and hydroureter due to #1. 3. Aortic Atherosclerosis (ICD10-I70.0) and Emphysema (ICD10-J43.9). Electronically Signed   By: Genevie Ann M.D.   On: 03/30/2021 07:24   IR NEPHROSTOMY PLACEMENT LEFT  Result Date: 03/30/2021 INDICATION: Bladder mass with bilateral obstructive uropathy and acute on chronic kidney injury EXAM: 1. Ultrasound-guided puncture of a left renal calyx 2. Left antegrade nephrostogram through percutaneous access 3. Placement of a left percutaneous nephrostomy tube using fluoroscopic guidance 4. Ultrasound-guided puncture of a right renal calyx 5. Right antegrade nephrostogram to percutaneous access 6. Placement of a right percutaneous nephrostomy tube using fluoroscopic guidance COMPARISON:  None. MEDICATIONS: The patient received a single dose of Rocephin prior to arriving to the IR suite. ANESTHESIA/SEDATION: Fentanyl 2 mcg IV; Versed 75 mg IV Moderate Sedation Time:  53 The patient was continuously monitored during the procedure by the interventional radiology nurse under my direct supervision. CONTRAST:  20 mL-administered into  the collecting system(s) FLUOROSCOPY TIME:  Fluoroscopy Time: 3 minutes 36 seconds with 7 exposures COMPLICATIONS: None immediate. PROCEDURE: Informed written consent was obtained from the patient after a thorough discussion of the procedural risks, benefits and alternatives. All questions were addressed. Maximal Sterile Barrier Technique was utilized including caps, mask, sterile gowns, sterile gloves, sterile drape, hand hygiene and skin antiseptic. A timeout was performed prior to the initiation of the procedure. The patient was placed prone on the exam table. The bilateral flanks were prepped and draped in the standard sterile fashion. Attention was first turned to the left side. Ultrasound was used to evaluate the left kidney, which demonstrated moderate to severe hydronephrosis. Skin entry site was marked, and local analgesia was obtained with 1% lidocaine. Using ultrasound guidance, an appropriate lower pole posterior calyx was punctured using a 21-gauge Chiba needle. Entry into the collecting system was confirmed with return of urine, and gentle injection of contrast material under fluoroscopy opacifying the proximal collecting system. An 018 wire was advanced through the needle into the renal pelvis and proximal ureter, followed by placement of a transition dilator. An antegrade nephrostogram was then performed, which demonstrated moderate to severe hydronephrosis. Over an 035 Amplatz wire, the percutaneous tract was serially dilated, and a 10.2 French nephrostomy tube was advanced with the loop formed in the renal pelvis. Appropriate positioning of the nephrostomy tube was confirmed with injection of contrast material. The nephrostomy tube was secured to skin using silk suture and a dressing. It was attached to bag  drainage. Attention was then turned to the contralateral right side. The patient was placed prone on the exam table. The right flank was prepped and draped in the standard sterile fashion.  Ultrasound was used to evaluate the side kidney, which demonstrated moderate to severe hydronephrosis. Skin entry site was marked, and local analgesia was obtained with 1% lidocaine. Using ultrasound guidance, an appropriate lower pole posterior calyx was punctured using a 21-gauge Chiba needle. Entry into the collecting system was confirmed with return of urine, and gentle injection of contrast material under fluoroscopy opacifying the proximal collecting system. An 018 wire was advanced through the needle into the renal pelvis and proximal ureter, followed by placement of a transition dilator. An antegrade nephrostogram was then performed, which demonstrated moderate to severe hydronephrosis. Over an 035 Amplatz wire, the percutaneous tract was serially dilated, and a 10.2 French nephrostomy tube was advanced with the loop formed in the renal pelvis. Appropriate positioning of the nephrostomy tube was confirmed with injection of contrast material. The nephrostomy tube was secured to skin using silk suture and a dressing. It was attached to bag drainage. The patient tolerated the procedure well without immediate complication, and was transferred to recovery in stable condition. IMPRESSION: 1. Successful placement of bilateral 10.2 French percutaneous nephrostomy tubes using ultrasound and fluoroscopic guidance for treatment of obstructive uropathy secondary to bladder mass. 2. Nephrostomy tubes placed to bag drainage. The patient will return to Interventional Radiology in approximately 4-6 weeks for routine exchange pending further operative plans. Electronically Signed   By: Albin Felling M.D.   On: 03/30/2021 13:42    amLODipine  10 mg Oral Daily   atorvastatin  40 mg Oral Daily   citalopram  20 mg Oral Daily   docusate sodium  100 mg Oral BID   finasteride  5 mg Oral Daily   umeclidinium bromide  1 puff Inhalation Daily   And   fluticasone furoate-vilanterol  1 puff Inhalation Daily   metoprolol  succinate  25 mg Oral Daily   tamsulosin  0.8 mg Oral QHS    BMET    Component Value Date/Time   NA 138 03/31/2021 0104   K 4.5 03/31/2021 0104   CL 112 (H) 03/31/2021 0104   CO2 16 (L) 03/31/2021 0104   GLUCOSE 90 03/31/2021 0104   BUN 84 (H) 03/31/2021 0104   CREATININE 5.70 (H) 03/31/2021 0104   CREATININE 2.14 (H) 04/04/2020 0748   CALCIUM 8.9 03/31/2021 0104   GFRNONAA 10 (L) 03/31/2021 0104   GFRNONAA 30 (L) 04/04/2020 0748   GFRAA 35 (L) 04/04/2020 0748   CBC    Component Value Date/Time   WBC 10.3 03/31/2021 0104   RBC 3.33 (L) 03/31/2021 0104   HGB 9.0 (L) 03/31/2021 0104   HCT 27.3 (L) 03/31/2021 0104   PLT 327 03/31/2021 0104   MCV 82.0 03/31/2021 0104   MCH 27.0 03/31/2021 0104   MCHC 33.0 03/31/2021 0104   RDW 17.2 (H) 03/31/2021 0104   LYMPHSABS 0.8 03/29/2021 1852   MONOABS 1.0 03/29/2021 1852   EOSABS 0.4 03/29/2021 1852   BASOSABS 0.0 03/29/2021 1852    Assessment/Plan:  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  S/p bilateral nephrostomy tubes.  Starting to see slow improvement of UOP and BUN/Cr.  No indication for dialysis at this time.   Will continue to follow UOP and Scr. Obstructive uropathy with severe bilateral hydronephrosis - large posterior bladder obstructing both distal ureters  and UVJ's concerning for urothelial carcinoma.   Urology has been consulted.   S/p bilateral nephrostomy tubes by IR with more output from right sided nephrostomy tube compared to left. COPD - has chronic SOB mostly upon exertion for the past few months.  On inhalers and referred to Pulmonary rehab. Hyponatremia - due to AKI.  Will follow with resolution of obstruction. Anion gap metabolic acidosis - likely due to AKI.  Agree with bicarb and follow. Anemia of CKD stage IIIb - Hgb was 13.6 in July.  Will check iron stores and follow.  Hold off on ESA due to malignancy recurrence. HTN - stable, continue with home  meds.  Not on ACE or ARB  Donetta Potts, MD [facility] [phone]

## 2021-03-31 NOTE — Progress Notes (Addendum)
PROGRESS NOTE    Samuel [name]  YSA:[id number] DOB: 23-Apr-1951 DOA: 03/29/2021 PCP: Dorothyann Peng, NP    Chief Complaint  Patient presents with   Hematuria    Brief Narrative:  Patient 70 year old gentleman history of bladder cancer status post TURBT(09/2020), COPD, depression/anxiety, CKD stage IIIb (baseline creatinine 1.8-2.1) who presented to the ED with hematuria and also noted to be in acute renal failure from labs obtained from his pulmonologist.  Patient had presented with complaints of generalized fatigue, hematuria, some increasing shortness of breath.  Patient seen and his pulmonologist 1 day prior to admission blood work done and patient called due to abnormal renal function.  Patient seen in the ED noted to have urinary frequency, intermittent hematuria, urinalysis concerning for UTI, noted to be in acute renal failure with a creatinine of 6.06, BUN of 95, urinalysis with gross hematuria, CT with mass noted in the bladder with bilateral hydronephrosis.  Urology, nephrology consulted.  IR also consulted for bilateral percutaneous nephrostomy tubes that were placed 03/30/2021.   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 -Patient is status post bilateral percutaneous nephrostomy tubes 03/30/2021. -Continue IV fluids. -Patient with some serosanguineous urine noted in nephrostomy tubes. -Urine output of 3.025 L over the past 24 hours from nephrostomy tubes. -Patient  seen in consultation by nephrology and urology no need for dialysis at this time. -Per nephrology.  2.  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 -Status post bilateral percutaneous nephrostomy tubes. -Urology following and feel patient would likely require cystoscopy with resection once renal function has normalized. -Per urology.  3.  COPD/chronic shortness of breath -Continue current bronchodilators.  4.  Hypertension Continue Toprol-XL, Norvasc.  5.  Hyperlipidemia Continue statin.  6.  Depression/anxiety -Celexa.  7.  UTI -Urinalysis consistent with UTI. -Urine cultures pending. -Place on IV Rocephin pending urine cultures.  8.  Metabolic acidosis -Likely secondary to acute renal failure on chronic kidney disease. -Place on bicarb tablets.   DVT prophylaxis: SCDs Code Status: DNR Family Communication: Updated patient and wife at bedside. Disposition:   Status is: Inpatient  Remains inpatient appropriate because: Severity of illness       Consultants:  Interventional radiology: Dr.El-Abd 03/30/2021 Nephrology: Dr. Marval Regal 03/30/2021 Urology: Dr. Alyson Ingles 03/30/2021  Procedures:  CT renal stone protocol 03/30/2021 Chest x-ray 03/29/2021 Bilateral percutaneous nephrostomy tube placement per IR 03/30/2021  Antimicrobials:  IV Rocephin 03/31/2021>>>>>   Subjective: Sitting up in chair.  Wife at side.  No chest pain.  No shortness of breath.  No abdominal pain.  Overall starting to feel a little bit better than he did on presentation  Objective: Vitals:   03/30/21 1952 03/31/21 0008 03/31/21 0559 03/31/21 0834  BP: 125/65 125/65 124/72 131/65  Pulse: 79 77 81 83  Resp: 17 17 18 16   Temp: 98.4 F (36.9 C) 98.4 F (36.9 C) 98.1 F (36.7 C) 98.1 F (36.7 C)  TempSrc: Oral Oral Oral Oral  SpO2: 98% 98% 97% 98%  Weight:  Height:        Intake/Output  Summary (Last 24 hours) at 03/31/2021 1240 Last data filed at 03/31/2021 1140 Gross per 24 hour  Intake 1125.96 ml  Output 4250 ml  Net -3124.04 ml   Filed Weights   03/30/21 1535  Weight: 67.9 kg    Examination:  General exam: Appears calm and comfortable  Respiratory system: Clear to auscultation.  No wheezes, no crackles, no rhonchi.  Respiratory effort normal. Cardiovascular system: S1 & S2 heard, RRR. No JVD, murmurs, rubs, gallops or clicks. No pedal edema. Gastrointestinal system: Abdomen is nondistended, soft and nontender. No organomegaly or masses felt. Normal bowel sounds heard.  Nephrostomy tubes with serosanguineous drainage noted. Central nervous system: Alert and oriented. No focal neurological deficits. Extremities: Symmetric 5 x 5 power. Skin: No rashes, lesions or ulcers Psychiatry: Judgement and insight appear normal. Mood & affect appropriate.     Data Reviewed: I have personally reviewed following labs and imaging studies  CBC: Recent Labs  Lab 03/29/21 1142 03/29/21 1852 03/31/21 0104  WBC 9.2 10.4 10.3  NEUTROABS 7.4 8.3*  --   HGB 10.3* 9.7* 9.0*  HCT 30.7* 30.3* 27.3*  MCV 80.2 82.3 82.0  PLT 444.0* 408* 209    Basic Metabolic Panel: Recent Labs  Lab 03/29/21 1142 03/29/21 1852 03/31/21 0104  NA 137 134* 138  K 5.1 4.8 4.5  CL 105 106 112*  CO2 19 14* 16*  GLUCOSE 97 110* 90  BUN 90* 95* 84*  CREATININE 5.90* 6.06* 5.70*  CALCIUM 9.5 8.8* 8.9  PHOS  --   --  5.8*    GFR: Estimated Creatinine Clearance: 9.6 mL/min (A) (by C-G formula based on SCr of 5.7 mg/dL (H)).  Liver Function Tests: Recent Labs  Lab 03/31/21 0104  ALBUMIN 3.1*    CBG: No results for input(s): GLUCAP in the last 168 hours.   Recent Results (from the past 240 hour(s))  Urine Culture     Status: Abnormal (Preliminary result)   Collection Time: 03/30/21  8:26 AM   Specimen: Urine, Clean Catch  Result Value Ref Range Status   Specimen Description  URINE, CLEAN CATCH  Final   Special Requests NONE  Final   Culture (A)  Final    30,000 COLONIES/mL PROTEUS MIRABILIS SUSCEPTIBILITIES TO FOLLOW Performed at [facility], [address], [place], [place] [address]    Report Status PENDING  Incomplete         Radiology Studies: CT Renal Stone Study  Result Date: 03/30/2021 CLINICAL DATA:  70 year old male with gross hematuria. Acute renal insufficiency. EXAM: CT ABDOMEN AND PELVIS WITHOUT CONTRAST TECHNIQUE: Multidetector CT imaging of the abdomen and pelvis was performed following the standard protocol without IV contrast. COMPARISON:  Chest CT 09/27/2020. FINDINGS: Lower chest: Emphysema. Stable lung bases, mild scarring. No cardiomegaly, pericardial effusion or pleural effusion. Hepatobiliary: Negative noncontrast liver and gallbladder. Pancreas: Negative. Spleen: Negative. Adrenals/Urinary Tract: Normal adrenal glands. Severe bilateral hydronephrosis and hydroureter. A degree of chronic atrophy of the left kidney is suspected. There are superimposed exophytic but probable benign bilateral renal cysts. Tortuous ureters remain dilated to the ureterovesical junctions, where a fungating, lobulated intermediate density posterior bladder mass is identified. See series 3, image 65 and coronal image 88. The mass is eccentric to the left, but appears to extend across midline posteriorly (series 3, image 67) and encompasses 27 by 50 by 65 mm (AP by transverse by CC). Bilateral distal hydroureter is 13-14 mm diameter. Relatively  normal bladder volume. Note also superimposed posterior bladder fundus nodularity (series 7, image 68), although might be trabeculation. Stomach/Bowel: Severe diverticulosis of the sigmoid colon, but no active inflammation. Redundant large bowel with mild retained stool throughout. Normal retrocecal appendix. Decompressed and negative terminal ileum. No dilated small bowel. Decompressed stomach. Negative duodenum. No free  air or free fluid. Vascular/Lymphatic: Extensive Aortoiliac calcified atherosclerosis. Normal caliber abdominal aorta. Maximal to mildly enlarged left retroperitoneal lymph node at the pelvic inlet series 3, image 46, 11 mm short axis. Right greater than left iliac Chain pelvic lymphadenopathy, up to 17 mm short axis on the right. Reproductive: Negative aside from urinary bladder abnormality. Other: No pelvic free fluid. Musculoskeletal: Degeneration in the spine. No acute osseous abnormality identified. IMPRESSION: 1. Large posterior bladder obstructing both distal ureters at the UVJs. 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 Associated pelvic and retroperitoneal lymphadenopathy highly suspicious for nodal metastatic disease. And note also additional nodularity of the posterior bladder fundus. 2. Severe bilateral hydronephrosis and hydroureter due to #1. 3. Aortic Atherosclerosis (ICD10-I70.0) and Emphysema (ICD10-J43.9). Electronically Signed   By: Genevie Ann M.D.   On: 03/30/2021 07:24   IR NEPHROSTOMY PLACEMENT LEFT  Result Date: 03/30/2021 INDICATION: Bladder mass with bilateral obstructive uropathy and acute on chronic kidney injury EXAM: 1. Ultrasound-guided puncture of a left renal calyx 2. Left antegrade nephrostogram through percutaneous access 3. Placement of a left percutaneous nephrostomy tube using fluoroscopic guidance 4. Ultrasound-guided puncture of a right renal calyx 5. Right antegrade nephrostogram to percutaneous access 6. Placement of a right percutaneous nephrostomy tube using fluoroscopic guidance COMPARISON:  None. MEDICATIONS: The patient received a single dose of Rocephin prior to arriving to the IR suite. ANESTHESIA/SEDATION: Fentanyl 2 mcg IV; Versed 75 mg IV Moderate Sedation Time:  45 The patient was continuously monitored during the procedure by the interventional radiology nurse under my direct supervision. CONTRAST:  20 mL-administered into the collecting system(s)  FLUOROSCOPY TIME:  Fluoroscopy Time: 3 minutes 36 seconds with 7 exposures COMPLICATIONS: None immediate. PROCEDURE: Informed written consent was obtained from the patient after a thorough discussion of the procedural risks, benefits and alternatives. All questions were addressed. Maximal Sterile Barrier Technique was utilized including caps, mask, sterile gowns, sterile gloves, sterile drape, hand hygiene and skin antiseptic. A timeout was performed prior to the initiation of the procedure. The patient was placed prone on the exam table. The bilateral flanks were prepped and draped in the standard sterile fashion. Attention was first turned to the left side. Ultrasound was used to evaluate the left kidney, which demonstrated moderate to severe hydronephrosis. Skin entry site was marked, and local analgesia was obtained with 1% lidocaine. Using ultrasound guidance, an appropriate lower pole posterior calyx was punctured using a 21-gauge Chiba needle. Entry into the collecting system was confirmed with return of urine, and gentle injection of contrast material under fluoroscopy opacifying the proximal collecting system. An 018 wire was advanced through the needle into the renal pelvis and proximal ureter, followed by placement of a transition dilator. An antegrade nephrostogram was then performed, which demonstrated moderate to severe hydronephrosis. Over an 035 Amplatz wire, the percutaneous tract was serially dilated, and a 10.2 French nephrostomy tube was advanced with the loop formed in the renal pelvis. Appropriate positioning of the nephrostomy tube was confirmed with injection of contrast material. The nephrostomy tube was secured to skin using silk suture and a dressing. It was attached to bag drainage. Attention was then turned to the  contralateral right side. The patient was placed prone on the exam table. The right flank was prepped and draped in the standard sterile fashion. Ultrasound was used to  evaluate the side kidney, which demonstrated moderate to severe hydronephrosis. Skin entry site was marked, and local analgesia was obtained with 1% lidocaine. Using ultrasound guidance, an appropriate lower pole posterior calyx was punctured using a 21-gauge Chiba needle. Entry into the collecting system was confirmed with return of urine, and gentle injection of contrast material under fluoroscopy opacifying the proximal collecting system. An 018 wire was advanced through the needle into the renal pelvis and proximal ureter, followed by placement of a transition dilator. An antegrade nephrostogram was then performed, which demonstrated moderate to severe hydronephrosis. Over an 035 Amplatz wire, the percutaneous tract was serially dilated, and a 10.2 French nephrostomy tube was advanced with the loop formed in the renal pelvis. Appropriate positioning of the nephrostomy tube was confirmed with injection of contrast material. The nephrostomy tube was secured to skin using silk suture and a dressing. It was attached to bag drainage. The patient tolerated the procedure well without immediate complication, and was transferred to recovery in stable condition. IMPRESSION: 1. Successful placement of bilateral 10.2 French percutaneous nephrostomy tubes using ultrasound and fluoroscopic guidance for treatment of obstructive uropathy secondary to bladder mass. 2. Nephrostomy tubes placed to bag drainage. The patient will return to Interventional Radiology in approximately 4-6 weeks for routine exchange pending further operative plans. Electronically Signed   By: Albin Felling M.D.   On: 03/30/2021 13:42        Scheduled Meds:  amLODipine  10 mg Oral Daily   atorvastatin  40 mg Oral Daily   citalopram  20 mg Oral Daily   docusate sodium  100 mg Oral BID   finasteride  5 mg Oral Daily   umeclidinium bromide  1 puff Inhalation Daily   And   fluticasone furoate-vilanterol  1 puff Inhalation Daily   metoprolol  succinate  25 mg Oral Daily   tamsulosin  0.8 mg Oral QHS   Continuous Infusions:  cefTRIAXone (ROCEPHIN)  IV 1 g (03/31/21 1140)   lactated ringers 75 mL/hr at 03/31/21 0158     LOS: 1 day    Time spent: 40 minutes    Irine Seal, MD [facility]   To contact the attending provider between 7A-7P or the covering provider during after hours 7P-7A, please log into the web site [email or web address] and access using universal [place] password for that web site. If you do not have the password, please call the hospital operator.  03/31/2021, 12:40 PM

## 2021-04-01 ENCOUNTER — Other Ambulatory Visit: Payer: Self-pay | Admitting: Radiology

## 2021-04-01 DIAGNOSIS — C679 Malignant neoplasm of bladder, unspecified: Secondary | ICD-10-CM | POA: Diagnosis not present

## 2021-04-01 DIAGNOSIS — N139 Obstructive and reflux uropathy, unspecified: Secondary | ICD-10-CM | POA: Diagnosis not present

## 2021-04-01 DIAGNOSIS — N390 Urinary tract infection, site not specified: Secondary | ICD-10-CM | POA: Diagnosis not present

## 2021-04-01 DIAGNOSIS — N179 Acute kidney failure, unspecified: Secondary | ICD-10-CM | POA: Diagnosis not present

## 2021-04-01 LAB — URINE CULTURE: Culture: 30000 — AB

## 2021-04-01 LAB — RENAL FUNCTION PANEL
Albumin: 2.9 g/dL — ABNORMAL LOW (ref 3.5–5.0)
Anion gap: 9 (ref 5–15)
BUN: 69 mg/dL — ABNORMAL HIGH (ref 8–23)
CO2: 20 mmol/L — ABNORMAL LOW (ref 22–32)
Calcium: 8.3 mg/dL — ABNORMAL LOW (ref 8.9–10.3)
Chloride: 112 mmol/L — ABNORMAL HIGH (ref 98–111)
Creatinine, Ser: 4.63 mg/dL — ABNORMAL HIGH (ref 0.61–1.24)
GFR, Estimated: 13 mL/min — ABNORMAL LOW (ref >60)
Glucose, Bld: 96 mg/dL (ref 70–99)
Phosphorus: 4.7 mg/dL — ABNORMAL HIGH (ref 2.5–4.6)
Potassium: 4 mmol/L (ref 3.5–5.1)
Sodium: 141 mmol/L (ref 135–145)

## 2021-04-01 LAB — CBC
HCT: 26.9 % — ABNORMAL LOW (ref 39.0–52.0)
Hemoglobin: 8.7 g/dL — ABNORMAL LOW (ref 13.0–17.0)
MCH: 26.6 pg (ref 26.0–34.0)
MCHC: 32.3 g/dL (ref 30.0–36.0)
MCV: 82.3 fL (ref 80.0–100.0)
Platelets: 354 10*3/uL (ref 150–400)
RBC: 3.27 MIL/uL — ABNORMAL LOW (ref 4.22–5.81)
RDW: 17.4 % — ABNORMAL HIGH (ref 11.5–15.5)
WBC: 11.3 10*3/uL — ABNORMAL HIGH (ref 4.0–10.5)
nRBC: 0 % (ref 0.0–0.2)

## 2021-04-01 MED ORDER — SODIUM CHLORIDE 0.9 % IV SOLN
2.0000 g | INTRAVENOUS | Status: AC
  Administered 2021-04-01 – 2021-04-02 (×2): 2 g via INTRAVENOUS
  Filled 2021-04-01 (×2): qty 20

## 2021-04-01 MED ORDER — IPRATROPIUM-ALBUTEROL 0.5-2.5 (3) MG/3ML IN SOLN: 3.0000 mL | RESPIRATORY_TRACT | Status: DC | PRN

## 2021-04-01 NOTE — Progress Notes (Signed)
Mobility Specialist Progress Note:   04/01/21 1400  Mobility  Activity Ambulated in hall  Level of Assistance Independent  Assistive Device None  Distance Ambulated (ft) 200 ft  Mobility Ambulated independently in hallway  Mobility Response Tolerated fair  Mobility performed by Mobility specialist  $Mobility charge 1 Mobility   Pre Mobility: SpO2 90% During Mobility: SpO2 87% Post Mobility: SpO2 89%  Pt displayed SOB during ambulation. Desat to 87% with exertion and requires a standing rest break. Pt wears O2 at night at baseline. Pt back in bed with all needs met.   Nelta Numbers Mobility Specialist  Phone [phone]

## 2021-04-01 NOTE — Progress Notes (Signed)
Referring Physician(s): Dr. Thomasenia Bottoms  Supervising Physician: Corrie Mckusick  Patient Status:  [facility] - In-pt  Chief Complaint:  Obstructed bladder mass with bilateral hydronephrosis s/p bilateral nephrostomy tube placement on 11.18.22 by Dr. Rhunette Croft  Subjective:  Patient sitting up at bedside eating breakfast. Daughter at bedside. Patient has no complaints and no questions. Requesting to ambulate the halls. CNA made aware.   Allergies: Penicillins  Medications: Prior to Admission medications   Medication Sig Start Date End Date Taking? Authorizing Provider  albuterol (PROVENTIL) (2.5 MG/3ML) 0.083% nebulizer solution Take 2.5 mg by nebulization in the morning and at bedtime.   Yes [provider]  albuterol (VENTOLIN HFA) 108 (90 Base) MCG/ACT inhaler Inhale 2 puffs into the lungs every 6 (six) hours as needed for wheezing or shortness of breath. 11/08/20  Yes Dewald, Cheryle Horsfall, MD  amLODipine (NORVASC) 10 MG tablet Take 10 mg by mouth daily. 06/05/20  Yes [provider]  atorvastatin (LIPITOR) 40 MG tablet Take 1 tablet (40 mg total) by mouth daily. Take 1 tab daily Patient taking differently: Take 40 mg by mouth daily. 11/21/20  Yes [name], [name], NP  Budeson-Glycopyrrol-Formoterol (BREZTRI AEROSPHERE) 160-9-4.8 MCG/ACT AERO Inhale 2 puffs into the lungs in the morning and at bedtime. 03/27/21  Yes [name], [name], NP  Cholecalciferol (VITAMIN D3 PO) Take 1 tablet by mouth daily.   Yes [provider]  citalopram (CELEXA) 20 MG tablet Take 1 tablet (20 mg total) by mouth daily. Patient taking differently: Take 20 mg by mouth at bedtime. 03/27/21  Yes [name], [name], NP  finasteride (PROSCAR) 5 MG tablet Take 5 mg by mouth daily. 05/15/20  Yes Alexis Frock, MD  metoprolol succinate (TOPROL-XL) 25 MG 24 hr tablet Take 25 mg by mouth daily.   Yes [provider]  OXYGEN Inhale 2 L into the lungs See admin instructions. 2L when taking a nap and at  bedtime   Yes [provider]  tamsulosin (FLOMAX) 0.4 MG CAPS capsule Take 2 capsules by mouth at bedtime. 04/21/20  Yes [provider]  vitamin B-12 (CYANOCOBALAMIN) 500 MCG tablet Take 1,000 mcg by mouth daily.   Yes [provider]     Vital Signs: BP (!) 127/52 (BP Location: Left Arm)   Pulse 85   Temp 97.9 F (36.6 C) (Oral)   Resp 18   Ht 5\' 3"  (1.6 m)   Wt 149 lb 10.7 oz (67.9 kg)   SpO2 98%   BMI 26.51 kg/m   Physical Exam Vitals and nursing note reviewed.  Constitutional:      Appearance: He is well-developed.  HENT:     Head: Normocephalic.  Pulmonary:     Effort: Pulmonary effort is normal.  Genitourinary:    Comments: Bilateral nephrostomy tube unremarkable output from right >left. Size: Fr size: 10.2 Fr Date of placement: 11.1/8.22  Currently to: Drain collection device: gravity 24 hour output:  Left Nephrostomy tube - 275 ml, 475 ml. - 35  ml of bloody urine noted to be  in gravity bag Right Nephrostomy tube -  950 ml 2550 ml - 150 of pink tinged urine noted to be in gravity bag.  Musculoskeletal:        General: Normal range of motion.     Cervical back: Normal range of motion.  Skin:    General: Skin is dry.  Neurological:     Mental Status: He is alert and oriented to person, place, and time.  Imaging: DG Chest 2 View  Result Date: 03/29/2021 CLINICAL DATA:  Shortness of breath EXAM: CHEST - 2 VIEW COMPARISON:  Chest x-ray dated June 08, 2020 FINDINGS: Cardiac and mediastinal contours are unchanged. Bilateral linear opacities, likely related to underlying emphysema. No new parenchymal process. No large pleural effusion or evidence of pneumothorax. IMPRESSION: Chronic findings related to emphysema.  No focal opacity. Electronically Signed   By: Yetta Glassman M.D.   On: 03/29/2021 12:01   CT Renal Stone Study  Result Date: 03/30/2021 CLINICAL DATA:  70 year old male with gross hematuria. Acute renal insufficiency.  EXAM: CT ABDOMEN AND PELVIS WITHOUT CONTRAST TECHNIQUE: Multidetector CT imaging of the abdomen and pelvis was performed following the standard protocol without IV contrast. COMPARISON:  Chest CT 09/27/2020. FINDINGS: Lower chest: Emphysema. Stable lung bases, mild scarring. No cardiomegaly, pericardial effusion or pleural effusion. Hepatobiliary: Negative noncontrast liver and gallbladder. Pancreas: Negative. Spleen: Negative. Adrenals/Urinary Tract: Normal adrenal glands. Severe bilateral hydronephrosis and hydroureter. A degree of chronic atrophy of the left kidney is suspected. There are superimposed exophytic but probable benign bilateral renal cysts. Tortuous ureters remain dilated to the ureterovesical junctions, where a fungating, lobulated intermediate density posterior bladder mass is identified. See series 3, image 65 and coronal image 88. The mass is eccentric to the left, but appears to extend across midline posteriorly (series 3, image 67) and encompasses 27 by 50 by 65 mm (AP by transverse by CC). Bilateral distal hydroureter is 13-14 mm diameter. Relatively normal bladder volume. Note also superimposed posterior bladder fundus nodularity (series 7, image 68), although might be trabeculation. Stomach/Bowel: Severe diverticulosis of the sigmoid colon, but no active inflammation. Redundant large bowel with mild retained stool throughout. Normal retrocecal appendix. Decompressed and negative terminal ileum. No dilated small bowel. Decompressed stomach. Negative duodenum. No free air or free fluid. Vascular/Lymphatic: Extensive Aortoiliac calcified atherosclerosis. Normal caliber abdominal aorta. Maximal to mildly enlarged left retroperitoneal lymph node at the pelvic inlet series 3, image 46, 11 mm short axis. Right greater than left iliac Chain pelvic lymphadenopathy, up to 17 mm short axis on the right. Reproductive: Negative aside from urinary bladder abnormality. Other: No pelvic free fluid.  Musculoskeletal: Degeneration in the spine. No acute osseous abnormality identified. IMPRESSION: 1. Large posterior bladder obstructing both distal ureters at the UVJs. 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 Associated pelvic and retroperitoneal lymphadenopathy highly suspicious for nodal metastatic disease. And note also additional nodularity of the posterior bladder fundus. 2. Severe bilateral hydronephrosis and hydroureter due to #1. 3. Aortic Atherosclerosis (ICD10-I70.0) and Emphysema (ICD10-J43.9). Electronically Signed   By: Genevie Ann M.D.   On: 03/30/2021 07:24   IR NEPHROSTOMY PLACEMENT LEFT  Result Date: 03/30/2021 INDICATION: Bladder mass with bilateral obstructive uropathy and acute on chronic kidney injury EXAM: 1. Ultrasound-guided puncture of a left renal calyx 2. Left antegrade nephrostogram through percutaneous access 3. Placement of a left percutaneous nephrostomy tube using fluoroscopic guidance 4. Ultrasound-guided puncture of a right renal calyx 5. Right antegrade nephrostogram to percutaneous access 6. Placement of a right percutaneous nephrostomy tube using fluoroscopic guidance COMPARISON:  None. MEDICATIONS: The patient received a single dose of Rocephin prior to arriving to the IR suite. ANESTHESIA/SEDATION: Fentanyl 2 mcg IV; Versed 75 mg IV Moderate Sedation Time:  2 The patient was continuously monitored during the procedure by the interventional radiology nurse under my direct supervision. CONTRAST:  20 mL-administered into the collecting system(s) FLUOROSCOPY TIME:  Fluoroscopy Time: 3 minutes 36 seconds with 7  exposures COMPLICATIONS: None immediate. PROCEDURE: Informed written consent was obtained from the patient after a thorough discussion of the procedural risks, benefits and alternatives. All questions were addressed. Maximal Sterile Barrier Technique was utilized including caps, mask, sterile gowns, sterile gloves, sterile drape, hand hygiene and skin antiseptic. A  timeout was performed prior to the initiation of the procedure. The patient was placed prone on the exam table. The bilateral flanks were prepped and draped in the standard sterile fashion. Attention was first turned to the left side. Ultrasound was used to evaluate the left kidney, which demonstrated moderate to severe hydronephrosis. Skin entry site was marked, and local analgesia was obtained with 1% lidocaine. Using ultrasound guidance, an appropriate lower pole posterior calyx was punctured using a 21-gauge Chiba needle. Entry into the collecting system was confirmed with return of urine, and gentle injection of contrast material under fluoroscopy opacifying the proximal collecting system. An 018 wire was advanced through the needle into the renal pelvis and proximal ureter, followed by placement of a transition dilator. An antegrade nephrostogram was then performed, which demonstrated moderate to severe hydronephrosis. Over an 035 Amplatz wire, the percutaneous tract was serially dilated, and a 10.2 French nephrostomy tube was advanced with the loop formed in the renal pelvis. Appropriate positioning of the nephrostomy tube was confirmed with injection of contrast material. The nephrostomy tube was secured to skin using silk suture and a dressing. It was attached to bag drainage. Attention was then turned to the contralateral right side. The patient was placed prone on the exam table. The right flank was prepped and draped in the standard sterile fashion. Ultrasound was used to evaluate the side kidney, which demonstrated moderate to severe hydronephrosis. Skin entry site was marked, and local analgesia was obtained with 1% lidocaine. Using ultrasound guidance, an appropriate lower pole posterior calyx was punctured using a 21-gauge Chiba needle. Entry into the collecting system was confirmed with return of urine, and gentle injection of contrast material under fluoroscopy opacifying the proximal collecting  system. An 018 wire was advanced through the needle into the renal pelvis and proximal ureter, followed by placement of a transition dilator. An antegrade nephrostogram was then performed, which demonstrated moderate to severe hydronephrosis. Over an 035 Amplatz wire, the percutaneous tract was serially dilated, and a 10.2 French nephrostomy tube was advanced with the loop formed in the renal pelvis. Appropriate positioning of the nephrostomy tube was confirmed with injection of contrast material. The nephrostomy tube was secured to skin using silk suture and a dressing. It was attached to bag drainage. The patient tolerated the procedure well without immediate complication, and was transferred to recovery in stable condition. IMPRESSION: 1. Successful placement of bilateral 10.2 French percutaneous nephrostomy tubes using ultrasound and fluoroscopic guidance for treatment of obstructive uropathy secondary to bladder mass. 2. Nephrostomy tubes placed to bag drainage. The patient will return to Interventional Radiology in approximately 4-6 weeks for routine exchange pending further operative plans. Electronically Signed   By: Albin Felling M.D.   On: 03/30/2021 13:42    Labs:  CBC: Recent Labs    03/29/21 1142 03/29/21 1852 03/31/21 0104 04/01/21 0044  WBC 9.2 10.4 10.3 11.3*  HGB 10.3* 9.7* 9.0* 8.7*  HCT 30.7* 30.3* 27.3* 26.9*  PLT 444.0* 408* 327 354    COAGS: Recent Labs    03/30/21 0925  INR 1.1    BMP: Recent Labs    04/04/20 0748 09/15/20 0659 03/29/21 1142 03/29/21 1852 03/31/21 0104 04/01/21 0044  NA 140   < > 137 134* 138 141  K 5.4*   < > 5.1 4.8 4.5 4.0  CL 105   < > 105 106 112* 112*  CO2 27  --  19 14* 16* 20*  GLUCOSE 113*   < > 97 110* 90 96  BUN 48*   < > 90* 95* 84* 69*  CALCIUM 10.3  --  9.5 8.8* 8.9 8.3*  CREATININE 2.14*   < > 5.90* 6.06* 5.70* 4.63*  GFRNONAA 30*  --   --  9* 10* 13*  GFRAA 35*  --   --   --   --   --    < > = values in this interval  not displayed.    LIVER FUNCTION TESTS: Recent Labs    04/04/20 0748 03/31/21 0104 04/01/21 0044  BILITOT 0.5  --   --   AST 13  --   --   ALT 15  --   --   PROT 7.4  --   --   ALBUMIN  --  3.1* 2.9*    Assessment and Plan:  70 y.o. male inpatient. History BPH, CKD, congenital deformity of the left hand, COPD, HTN, HLD, bladder cancer s/p TURBT in May 2022. Sent to the ED from his pulmonology office after he was found to have elevated kidney function. Found to be in AKI with bilateral hydronephrosis. CT renal from 11.18.22 reads severe bilateral  hydronephrosis due to Large posterior bladder obstructing both distal ureters at the UVJs. 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 IR placed bilateral nephrostomy tubes on 11.18.22. WBC 11.3, BUN 69 (down trending) Cr 4.63 ( down trending), Sodium 141 Hgb 8.7. per note from Urology  dated 11.19.22 plan is to proceed with bladder tumor resection after creatinine improves.   Bilateral nephrostomy tube unremarkable output from right >left. Size: Fr size: 10.2 Fr Date of placement: 11.1/8.22  Currently to: Drain collection device: gravity 24 hour output:  Left Nephrostomy tube - 275 ml, 475 ml. - 35  ml of bloody urine noted to be  in gravity bag Right Nephrostomy tube -  950 ml 2550 ml - 150 of pink tinged urine noted to be in gravity bag.    Current examination: Insertion site unremarkable. Suture and stat lock in place. Dressed appropriately.    Plan:  Record output Q shift.  Dressing changes QD or PRN if soiled.   Outpatient orders placed at this time.    IR will continue to follow along - plans per Urology  Electronically Signed: Jacqualine Mau, NP 04/01/2021, 9:30 AM   I spent a total of 15 Minutes at the patient's bedside AND on the patient's hospital floor or unit, greater than 50% of which was counseling/coordinating care for bilateral nephrostomy tubes

## 2021-04-01 NOTE — Progress Notes (Signed)
This rn sent secure chat to Rushie Nyhan, NP regarding nephrology say he notes clots left nephrostomy tube and asking if IR needs to do general assessment and flush nephrology tubes. Informed Anderson Malta this rn notes no visible clots in left or right nephrostomy tubes. Anderson Malta states she assessed tubes this am and "they are functioning well". Anderson Malta explains it is not a general practice to flush nephrostomy tubes d/t risk of causing pylo.

## 2021-04-01 NOTE — Progress Notes (Signed)
PROGRESS NOTE    Samuel [name]  IWL:[id number] DOB: 12/27/1950 DOA: 03/29/2021 PCP: Dorothyann Peng, NP    Chief Complaint  Patient presents with   Hematuria    Brief Narrative:  Patient 70 year old gentleman history of bladder cancer status post TURBT(09/2020), COPD, depression/anxiety, CKD stage IIIb (baseline creatinine 1.8-2.1) who presented to the ED with hematuria and also noted to be in acute renal failure from labs obtained from his pulmonologist.  Patient had presented with complaints of generalized fatigue, hematuria, some increasing shortness of breath.  Patient seen and his pulmonologist 1 day prior to admission blood work done and patient called due to abnormal renal function.  Patient seen in the ED noted to have urinary frequency, intermittent hematuria, urinalysis concerning for UTI, noted to be in acute renal failure with a creatinine of 6.06, BUN of 95, urinalysis with gross hematuria, CT with mass noted in the bladder with bilateral hydronephrosis.  Urology, nephrology consulted.  IR also consulted for bilateral percutaneous nephrostomy tubes that were placed 03/30/2021.   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 -Patient is status post bilateral percutaneous nephrostomy tubes 03/30/2021. -Right nephrostomy tube with output of 3.575 L over the past 24 hours with clear urine, left nephrostomy tube with serosanguineous drainage with 805 cc over the past 24  hours. -Creatinine trending down and currently at 4.63 from a peak of 6.06. -Continue IV fluids. -Patient seen in consultation by nephrology and urology no need for dialysis at this time. -Per nephrology.  2.  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 -Status post bilateral percutaneous nephrostomy tubes. -Urology following and feel patient would likely require cystoscopy with resection once renal function has normalized. -Per urology.  3.  COPD/chronic shortness of breath -Continue current bronchodilators.  4.  Hypertension -Controlled on current regimen of Norvasc, Toprol-XL.    5.  Hyperlipidemia Statin  6.  Depression/anxiety -Continue Celexa.   7.  UTI -Urinalysis consistent with UTI. -Urine cultures with 30,000 colonies of Proteus Mirabilis sensitive to ampicillin, cephalosporins, ciprofloxacin, Bactrim, Unasyn, Zosyn.  -Continue IV Rocephin and treat empirically for total of 3- 5 days.    8.  Metabolic acidosis -Likely secondary to acute renal failure on chronic kidney disease. -Improving on bicarb tablets which we will continue.   -Nephrology following.     DVT prophylaxis: SCDs Code Status: DNR Family Communication: Updated patient and wife at bedside. Disposition:   Status is: Inpatient  Remains inpatient appropriate because: Severity of illness       Consultants:  Interventional radiology: Dr.El-Abd 03/30/2021 Nephrology: Dr. Marval Regal 03/30/2021 Urology: Dr. Alyson Ingles 03/30/2021  Procedures:  CT renal stone protocol 03/30/2021 Chest x-ray 03/29/2021 Bilateral percutaneous nephrostomy tube placement per IR 03/30/2021  Antimicrobials:  IV Rocephin 03/31/2021>>>>>   Subjective: Sitting up in chair.  Daughter and wife at bedside.  No chest pain.  No shortness of breath.  No abdominal pain.  Overall feeling better than on admission.  Right nephrostomy tube  with 3.575 L urine output over the  past 24 hours, left nephrostomy tube with 805 cc urine output over the past 24 hours  Objective: Vitals:   03/31/21 1606 03/31/21 1950 04/01/21 0407 04/01/21 0749  BP: 120/71 114/69 116/73 (!) 127/52  Pulse: 76 78 81 85  Resp: 18 19 17 18   Temp: 98 F (36.7 C) 98.3 F (36.8 C) 98.3 F (36.8 C) 97.9 F (36.6 C)  TempSrc:  Oral Oral Oral  SpO2: 95% 97% 94% 98%  Weight:      Height:        Intake/Output Summary (Last 24 hours) at 04/01/2021 1014 Last data filed at 04/01/2021 1884 Gross per 24 hour  Intake 1090 ml  Output 3880 ml  Net -2790 ml    Filed Weights   03/30/21 1535  Weight: 67.9 kg    Examination:  General exam: NAD. Respiratory system: Lungs clear to auscultation bilaterally.  No wheezes, no crackles, no rhonchi.  Normal respiratory effort.  Speaking in full sentences.   Cardiovascular system: Regular rate and rhythm no murmurs rubs or gallops.  No JVD.  No lower extremity edema. Gastrointestinal system: Abdomen is soft, nontender, nondistended, positive bowel sounds.  Right nephrostomy tube with clear urine, left nephrostomy tube with serosanguineous fluid noted.   Central nervous system: Alert and oriented. No focal neurological deficits. Extremities: Symmetric 5 x 5 power. Skin: No rashes, lesions or ulcers Psychiatry: Judgement and insight appear normal. Mood & affect appropriate.     Data Reviewed: I have personally reviewed following labs and imaging studies  CBC: Recent Labs  Lab 03/29/21 1142 03/29/21 1852 03/31/21 0104 04/01/21 0044  WBC 9.2 10.4 10.3 11.3*  NEUTROABS 7.4 8.3*  --   --   HGB 10.3* 9.7* 9.0* 8.7*  HCT 30.7* 30.3* 27.3* 26.9*  MCV 80.2 82.3 82.0 82.3  PLT 444.0* 408* 327 354     Basic Metabolic Panel: Recent Labs  Lab 03/29/21 1142 03/29/21 1852 03/31/21 0104 04/01/21 0044  NA 137 134* 138 141  K 5.1 4.8 4.5 4.0  CL 105 106 112* 112*  CO2 19 14* 16* 20*  GLUCOSE 97 110* 90 96   BUN 90* 95* 84* 69*  CREATININE 5.90* 6.06* 5.70* 4.63*  CALCIUM 9.5 8.8* 8.9 8.3*  PHOS  --   --  5.8* 4.7*     GFR: Estimated Creatinine Clearance: 11.8 mL/min (A) (by C-G formula based on SCr of 4.63 mg/dL (H)).  Liver Function Tests: Recent Labs  Lab 03/31/21 0104 04/01/21 0044  ALBUMIN 3.1* 2.9*     CBG: No results for input(s): GLUCAP in the last 168 hours.   Recent Results (from the past 240 hour(s))  Urine Culture     Status: Abnormal   Collection Time: 03/30/21  8:26 AM   Specimen: Urine, Clean Catch  Result Value Ref Range Status   Specimen Description URINE, CLEAN CATCH  Final   Special Requests   Final    NONE Performed at [facility], [address] [place], [place] [address]    Culture 30,000 COLONIES/mL PROTEUS MIRABILIS (A)  Final   Report Status 04/01/2021 FINAL  Final   Organism ID, Bacteria PROTEUS MIRABILIS (A)  Final      Susceptibility   Proteus mirabilis - MIC*    AMPICILLIN <=2 SENSITIVE Sensitive     CEFAZOLIN <=4 SENSITIVE Sensitive     CEFEPIME <=0.12 SENSITIVE Sensitive     CEFTRIAXONE <=0.25 SENSITIVE Sensitive     CIPROFLOXACIN <=0.25 SENSITIVE Sensitive  GENTAMICIN <=1 SENSITIVE Sensitive     IMIPENEM 1 SENSITIVE Sensitive     NITROFURANTOIN 128 RESISTANT Resistant     TRIMETH/SULFA <=20 SENSITIVE Sensitive     AMPICILLIN/SULBACTAM <=2 SENSITIVE Sensitive     PIP/TAZO <=4 SENSITIVE Sensitive     * 30,000 COLONIES/mL PROTEUS MIRABILIS  Resp Panel by RT-PCR (Flu A&B, Covid) Nasopharyngeal Swab     Status: None   Collection Time: 03/31/21  7:05 PM   Specimen: Nasopharyngeal Swab; Nasopharyngeal(NP) swabs in vial transport medium  Result Value Ref Range Status   SARS Coronavirus 2 by RT PCR NEGATIVE NEGATIVE Final    Comment: (NOTE) SARS-CoV-2 target nucleic acids are NOT DETECTED.  The SARS-CoV-2 RNA is generally detectable in upper respiratory specimens during the acute phase of infection. The lowest concentration  of SARS-CoV-2 viral copies this assay can detect is 138 copies/mL. A negative result does not preclude SARS-Cov-2 infection and should not be used as the sole basis for treatment or other patient management decisions. A negative result may occur with  improper specimen collection/handling, submission of specimen other than nasopharyngeal swab, presence of viral mutation(s) within the areas targeted by this assay, and inadequate number of viral copies(<138 copies/mL). A negative result must be combined with clinical observations, patient history, and epidemiological information. The expected result is Negative.  Fact Sheet for Patients:  EntrepreneurPulse.com.au  Fact Sheet for Healthcare Providers:  IncredibleEmployment.be  This test is no t yet approved or cleared by the [place] FDA and  has been authorized for detection and/or diagnosis of SARS-CoV-2 by FDA under an Emergency Use Authorization (EUA). This EUA will remain  in effect (meaning this test can be used) for the duration of the COVID-19 declaration under Section 564(b)(1) of the Act, 21 U.S.C.section 360bbb-3(b)(1), unless the authorization is terminated  or revoked sooner.       Influenza A by PCR NEGATIVE NEGATIVE Final   Influenza B by PCR NEGATIVE NEGATIVE Final    Comment: (NOTE) The Xpert Xpress SARS-CoV-2/FLU/RSV plus assay is intended as an aid in the diagnosis of influenza from Nasopharyngeal swab specimens and should not be used as a sole basis for treatment. Nasal washings and aspirates are unacceptable for Xpert Xpress SARS-CoV-2/FLU/RSV testing.  Fact Sheet for Patients: EntrepreneurPulse.com.au  Fact Sheet for Healthcare Providers: IncredibleEmployment.be  This test is not yet approved or cleared by the [place] FDA and has been authorized for detection and/or diagnosis of SARS-CoV-2 by FDA under an Emergency Use  Authorization (EUA). This EUA will remain in effect (meaning this test can be used) for the duration of the COVID-19 declaration under Section 564(b)(1) of the Act, 21 U.S.C. section 360bbb-3(b)(1), unless the authorization is terminated or revoked.  Performed at [facility], [place] [address], [place], [place] [address]           Radiology Studies: IR NEPHROSTOMY PLACEMENT LEFT  Result Date: 03/30/2021 INDICATION: Bladder mass with bilateral obstructive uropathy and acute on chronic kidney injury EXAM: 1. Ultrasound-guided puncture of a left renal calyx 2. Left antegrade nephrostogram through percutaneous access 3. Placement of a left percutaneous nephrostomy tube using fluoroscopic guidance 4. Ultrasound-guided puncture of a right renal calyx 5. Right antegrade nephrostogram to percutaneous access 6. Placement of a right percutaneous nephrostomy tube using fluoroscopic guidance COMPARISON:  None. MEDICATIONS: The patient received a single dose of Rocephin prior to arriving to the IR suite. ANESTHESIA/SEDATION: Fentanyl 2 mcg IV; Versed 75 mg IV Moderate Sedation Time:  43 The patient  was continuously monitored during the procedure by the interventional radiology nurse under my direct supervision. CONTRAST:  20 mL-administered into the collecting system(s) FLUOROSCOPY TIME:  Fluoroscopy Time: 3 minutes 36 seconds with 7 exposures COMPLICATIONS: None immediate. PROCEDURE: Informed written consent was obtained from the patient after a thorough discussion of the procedural risks, benefits and alternatives. All questions were addressed. Maximal Sterile Barrier Technique was utilized including caps, mask, sterile gowns, sterile gloves, sterile drape, hand hygiene and skin antiseptic. A timeout was performed prior to the initiation of the procedure. The patient was placed prone on the exam table. The bilateral flanks were prepped and draped in the standard sterile fashion. Attention was first  turned to the left side. Ultrasound was used to evaluate the left kidney, which demonstrated moderate to severe hydronephrosis. Skin entry site was marked, and local analgesia was obtained with 1% lidocaine. Using ultrasound guidance, an appropriate lower pole posterior calyx was punctured using a 21-gauge Chiba needle. Entry into the collecting system was confirmed with return of urine, and gentle injection of contrast material under fluoroscopy opacifying the proximal collecting system. An 018 wire was advanced through the needle into the renal pelvis and proximal ureter, followed by placement of a transition dilator. An antegrade nephrostogram was then performed, which demonstrated moderate to severe hydronephrosis. Over an 035 Amplatz wire, the percutaneous tract was serially dilated, and a 10.2 French nephrostomy tube was advanced with the loop formed in the renal pelvis. Appropriate positioning of the nephrostomy tube was confirmed with injection of contrast material. The nephrostomy tube was secured to skin using silk suture and a dressing. It was attached to bag drainage. Attention was then turned to the contralateral right side. The patient was placed prone on the exam table. The right flank was prepped and draped in the standard sterile fashion. Ultrasound was used to evaluate the side kidney, which demonstrated moderate to severe hydronephrosis. Skin entry site was marked, and local analgesia was obtained with 1% lidocaine. Using ultrasound guidance, an appropriate lower pole posterior calyx was punctured using a 21-gauge Chiba needle. Entry into the collecting system was confirmed with return of urine, and gentle injection of contrast material under fluoroscopy opacifying the proximal collecting system. An 018 wire was advanced through the needle into the renal pelvis and proximal ureter, followed by placement of a transition dilator. An antegrade nephrostogram was then performed, which demonstrated  moderate to severe hydronephrosis. Over an 035 Amplatz wire, the percutaneous tract was serially dilated, and a 10.2 French nephrostomy tube was advanced with the loop formed in the renal pelvis. Appropriate positioning of the nephrostomy tube was confirmed with injection of contrast material. The nephrostomy tube was secured to skin using silk suture and a dressing. It was attached to bag drainage. The patient tolerated the procedure well without immediate complication, and was transferred to recovery in stable condition. IMPRESSION: 1. Successful placement of bilateral 10.2 French percutaneous nephrostomy tubes using ultrasound and fluoroscopic guidance for treatment of obstructive uropathy secondary to bladder mass. 2. Nephrostomy tubes placed to bag drainage. The patient will return to Interventional Radiology in approximately 4-6 weeks for routine exchange pending further operative plans. Electronically Signed   By: Albin Felling M.D.   On: 03/30/2021 13:42        Scheduled Meds:  amLODipine  10 mg Oral Daily   atorvastatin  40 mg Oral Daily   citalopram  20 mg Oral Daily   docusate sodium  100 mg Oral BID   finasteride  5 mg Oral Daily   umeclidinium bromide  1 puff Inhalation Daily   And   fluticasone furoate-vilanterol  1 puff Inhalation Daily   metoprolol succinate  25 mg Oral Daily   sodium bicarbonate  650 mg Oral BID   tamsulosin  0.8 mg Oral QHS   Continuous Infusions:  cefTRIAXone (ROCEPHIN)  IV     lactated ringers 75 mL/hr at 03/31/21 2003     LOS: 2 days    Time spent: 35 minutes    Irine Seal, MD [facility]   To contact the attending provider between 7A-7P or the covering provider during after hours 7P-7A, please log into the web site [email or web address] and access using universal [place] password for that web site. If you do not have the password, please call the hospital operator.  04/01/2021, 10:14 AM

## 2021-04-01 NOTE — Progress Notes (Signed)
Patient ID: Samuel [name], male   DOB: 10-02-1950, 70 y.o.   MRN: [id number] S: No complaints and no events overnight. O:BP (!) 127/52 (BP Location: Left Arm)   Pulse 85   Temp 97.9 F (36.6 C) (Oral)   Resp 18   Ht 5\' 3"  (1.6 m)   Wt 67.9 kg   SpO2 98%   BMI 26.51 kg/m   Intake/Output Summary (Last 24 hours) at 04/01/2021 1118 Last data filed at 04/01/2021 1048 Gross per 24 hour  Intake 1090 ml  Output 4080 ml  Net -2990 ml   Intake/Output: I/O last 3 completed shifts: In: 2132.6 [P.O.:1130; I.V.:802.6; Other:200] Out: 6130 [Urine:6130]  Intake/Output this shift:  Total I/O In: -  Out: 550 [Urine:550] Weight change:  Gen:NAD CVS:RRR Resp:occ rhonchi Back: bilateral nephrostomy tubes in place, right bag with pink urine, left bag gross hematuria with clots and less output. Abd: +BS, soft, NT/Nd Ext:no edema  Recent Labs  Lab 03/29/21 1142 03/29/21 1852 03/31/21 0104 04/01/21 0044  NA 137 134* 138 141  K 5.1 4.8 4.5 4.0  CL 105 106 112* 112*  CO2 19 14* 16* 20*  GLUCOSE 97 110* 90 96  BUN 90* 95* 84* 69*  CREATININE 5.90* 6.06* 5.70* 4.63*  ALBUMIN  --   --  3.1* 2.9*  CALCIUM 9.5 8.8* 8.9 8.3*  PHOS  --   --  5.8* 4.7*   Liver Function Tests: Recent Labs  Lab 03/31/21 0104 04/01/21 0044  ALBUMIN 3.1* 2.9*   No results for input(s): LIPASE, AMYLASE in the last 168 hours. No results for input(s): AMMONIA in the last 168 hours. CBC: Recent Labs  Lab 03/29/21 1142 03/29/21 1852 03/31/21 0104 04/01/21 0044  WBC 9.2 10.4 10.3 11.3*  NEUTROABS 7.4 8.3*  --   --   HGB 10.3* 9.7* 9.0* 8.7*  HCT 30.7* 30.3* 27.3* 26.9*  MCV 80.2 82.3 82.0 82.3  PLT 444.0* 408* 327 354   Cardiac Enzymes: No results for input(s): CKTOTAL, CKMB, CKMBINDEX, TROPONINI in the last 168 hours. CBG: No results for input(s): GLUCAP in the last 168 hours.  Iron Studies:  Recent Labs    03/31/21 0104  IRON 59  TIBC 231*  FERRITIN 274   Studies/Results: IR  NEPHROSTOMY PLACEMENT LEFT  Result Date: 03/30/2021 INDICATION: Bladder mass with bilateral obstructive uropathy and acute on chronic kidney injury EXAM: 1. Ultrasound-guided puncture of a left renal calyx 2. Left antegrade nephrostogram through percutaneous access 3. Placement of a left percutaneous nephrostomy tube using fluoroscopic guidance 4. Ultrasound-guided puncture of a right renal calyx 5. Right antegrade nephrostogram to percutaneous access 6. Placement of a right percutaneous nephrostomy tube using fluoroscopic guidance COMPARISON:  None. MEDICATIONS: The patient received a single dose of Rocephin prior to arriving to the IR suite. ANESTHESIA/SEDATION: Fentanyl 2 mcg IV; Versed 75 mg IV Moderate Sedation Time:  50 The patient was continuously monitored during the procedure by the interventional radiology nurse under my direct supervision. CONTRAST:  20 mL-administered into the collecting system(s) FLUOROSCOPY TIME:  Fluoroscopy Time: 3 minutes 36 seconds with 7 exposures COMPLICATIONS: None immediate. PROCEDURE: Informed written consent was obtained from the patient after a thorough discussion of the procedural risks, benefits and alternatives. All questions were addressed. Maximal Sterile Barrier Technique was utilized including caps, mask, sterile gowns, sterile gloves, sterile drape, hand hygiene and skin antiseptic. A timeout was performed prior to the initiation of the procedure. The patient was placed prone on the exam table. The bilateral  flanks were prepped and draped in the standard sterile fashion. Attention was first turned to the left side. Ultrasound was used to evaluate the left kidney, which demonstrated moderate to severe hydronephrosis. Skin entry site was marked, and local analgesia was obtained with 1% lidocaine. Using ultrasound guidance, an appropriate lower pole posterior calyx was punctured using a 21-gauge Chiba needle. Entry into the collecting system was confirmed with return  of urine, and gentle injection of contrast material under fluoroscopy opacifying the proximal collecting system. An 018 wire was advanced through the needle into the renal pelvis and proximal ureter, followed by placement of a transition dilator. An antegrade nephrostogram was then performed, which demonstrated moderate to severe hydronephrosis. Over an 035 Amplatz wire, the percutaneous tract was serially dilated, and a 10.2 French nephrostomy tube was advanced with the loop formed in the renal pelvis. Appropriate positioning of the nephrostomy tube was confirmed with injection of contrast material. The nephrostomy tube was secured to skin using silk suture and a dressing. It was attached to bag drainage. Attention was then turned to the contralateral right side. The patient was placed prone on the exam table. The right flank was prepped and draped in the standard sterile fashion. Ultrasound was used to evaluate the side kidney, which demonstrated moderate to severe hydronephrosis. Skin entry site was marked, and local analgesia was obtained with 1% lidocaine. Using ultrasound guidance, an appropriate lower pole posterior calyx was punctured using a 21-gauge Chiba needle. Entry into the collecting system was confirmed with return of urine, and gentle injection of contrast material under fluoroscopy opacifying the proximal collecting system. An 018 wire was advanced through the needle into the renal pelvis and proximal ureter, followed by placement of a transition dilator. An antegrade nephrostogram was then performed, which demonstrated moderate to severe hydronephrosis. Over an 035 Amplatz wire, the percutaneous tract was serially dilated, and a 10.2 French nephrostomy tube was advanced with the loop formed in the renal pelvis. Appropriate positioning of the nephrostomy tube was confirmed with injection of contrast material. The nephrostomy tube was secured to skin using silk suture and a dressing. It was attached  to bag drainage. The patient tolerated the procedure well without immediate complication, and was transferred to recovery in stable condition. IMPRESSION: 1. Successful placement of bilateral 10.2 French percutaneous nephrostomy tubes using ultrasound and fluoroscopic guidance for treatment of obstructive uropathy secondary to bladder mass. 2. Nephrostomy tubes placed to bag drainage. The patient will return to Interventional Radiology in approximately 4-6 weeks for routine exchange pending further operative plans. Electronically Signed   By: Albin Felling M.D.   On: 03/30/2021 13:42    amLODipine  10 mg Oral Daily   atorvastatin  40 mg Oral Daily   citalopram  20 mg Oral Daily   docusate sodium  100 mg Oral BID   finasteride  5 mg Oral Daily   umeclidinium bromide  1 puff Inhalation Daily   And   fluticasone furoate-vilanterol  1 puff Inhalation Daily   metoprolol succinate  25 mg Oral Daily   sodium bicarbonate  650 mg Oral BID   tamsulosin  0.8 mg Oral QHS    BMET    Component Value Date/Time   NA 141 04/01/2021 0044   K 4.0 04/01/2021 0044   CL 112 (H) 04/01/2021 0044   CO2 20 (L) 04/01/2021 0044   GLUCOSE 96 04/01/2021 0044   BUN 69 (H) 04/01/2021 0044   CREATININE 4.63 (H) 04/01/2021 0044   CREATININE 2.14 (  H) 04/04/2020 0748   CALCIUM 8.3 (L) 04/01/2021 0044   GFRNONAA 13 (L) 04/01/2021 0044   GFRNONAA 30 (L) 04/04/2020 0748   GFRAA 35 (L) 04/04/2020 0748   CBC    Component Value Date/Time   WBC 11.3 (H) 04/01/2021 0044   RBC 3.27 (L) 04/01/2021 0044   HGB 8.7 (L) 04/01/2021 0044   HCT 26.9 (L) 04/01/2021 0044   PLT 354 04/01/2021 0044   MCV 82.3 04/01/2021 0044   MCH 26.6 04/01/2021 0044   MCHC 32.3 04/01/2021 0044   RDW 17.4 (H) 04/01/2021 0044   LYMPHSABS 0.8 03/29/2021 1852   MONOABS 1.0 03/29/2021 1852   EOSABS 0.4 03/29/2021 1852   BASOSABS 0.0 03/29/2021 1852    Assessment/Plan:  AKI/CKD stage IIIb - due to obstructive uropathy with likely recurrence of  urothelial cancer.  S/p bilateral nephrostomy tubes.  Starting to see slow improvement of UOP and BUN/Cr.  No indication for dialysis at this time.    BUN/Cr continue to improve toward baseline of 2.1. Will continue to follow UOP and Scr. Obstructive uropathy with severe bilateral hydronephrosis - large posterior bladder obstructing both distal ureters and UVJ's concerning for urothelial carcinoma.   Urology following.  S/p bilateral nephrostomy tubes by IR with more output from right sided nephrostomy tube compared to left. IR following. COPD - has chronic SOB mostly upon exertion for the past few months.  On inhalers and referred to Pulmonary rehab. Hyponatremia - due to AKI.  Will follow with resolution of obstruction. Anion gap metabolic acidosis - likely due to AKI.  Agree with bicarb and follow. Anemia of CKD stage IIIb - Hgb was 13.6 in July.  Will check iron stores and follow.  Hold off on ESA due to malignancy recurrence. HTN - stable, continue with home meds.  Not on ACE or ARB  Donetta Potts, MD [facility] [phone]

## 2021-04-02 ENCOUNTER — Encounter (HOSPITAL_COMMUNITY): Payer: Self-pay

## 2021-04-02 DIAGNOSIS — N390 Urinary tract infection, site not specified: Secondary | ICD-10-CM | POA: Diagnosis not present

## 2021-04-02 DIAGNOSIS — N139 Obstructive and reflux uropathy, unspecified: Secondary | ICD-10-CM | POA: Diagnosis not present

## 2021-04-02 DIAGNOSIS — N179 Acute kidney failure, unspecified: Secondary | ICD-10-CM | POA: Diagnosis not present

## 2021-04-02 DIAGNOSIS — C679 Malignant neoplasm of bladder, unspecified: Secondary | ICD-10-CM | POA: Diagnosis not present

## 2021-04-02 HISTORY — PX: IR NEPHROSTOMY PLACEMENT RIGHT: IMG6064

## 2021-04-02 LAB — RENAL FUNCTION PANEL
Albumin: 2.7 g/dL — ABNORMAL LOW (ref 3.5–5.0)
Anion gap: 13 (ref 5–15)
BUN: 55 mg/dL — ABNORMAL HIGH (ref 8–23)
CO2: 20 mmol/L — ABNORMAL LOW (ref 22–32)
Calcium: 8.4 mg/dL — ABNORMAL LOW (ref 8.9–10.3)
Chloride: 108 mmol/L (ref 98–111)
Creatinine, Ser: 4.08 mg/dL — ABNORMAL HIGH (ref 0.61–1.24)
GFR, Estimated: 15 mL/min — ABNORMAL LOW (ref >60)
Glucose, Bld: 97 mg/dL (ref 70–99)
Phosphorus: 4.4 mg/dL (ref 2.5–4.6)
Potassium: 4 mmol/L (ref 3.5–5.1)
Sodium: 141 mmol/L (ref 135–145)

## 2021-04-02 LAB — CBC WITH DIFFERENTIAL/PLATELET
Abs Immature Granulocytes: 0.12 10*3/uL — ABNORMAL HIGH (ref 0.00–0.07)
Basophils Absolute: 0 10*3/uL (ref 0.0–0.1)
Basophils Relative: 0 %
Eosinophils Absolute: 0.5 10*3/uL (ref 0.0–0.5)
Eosinophils Relative: 4 %
HCT: 27.2 % — ABNORMAL LOW (ref 39.0–52.0)
Hemoglobin: 9 g/dL — ABNORMAL LOW (ref 13.0–17.0)
Immature Granulocytes: 1 %
Lymphocytes Relative: 9 %
Lymphs Abs: 1.2 10*3/uL (ref 0.7–4.0)
MCH: 26.9 pg (ref 26.0–34.0)
MCHC: 33.1 g/dL (ref 30.0–36.0)
MCV: 81.4 fL (ref 80.0–100.0)
Monocytes Absolute: 1.1 10*3/uL — ABNORMAL HIGH (ref 0.1–1.0)
Monocytes Relative: 9 %
Neutro Abs: 10 10*3/uL — ABNORMAL HIGH (ref 1.7–7.7)
Neutrophils Relative %: 77 %
Platelets: 319 10*3/uL (ref 150–400)
RBC: 3.34 MIL/uL — ABNORMAL LOW (ref 4.22–5.81)
RDW: 17.4 % — ABNORMAL HIGH (ref 11.5–15.5)
WBC: 12.9 10*3/uL — ABNORMAL HIGH (ref 4.0–10.5)
nRBC: 0 % (ref 0.0–0.2)

## 2021-04-02 MED ORDER — DICLOFENAC SODIUM 1 % EX GEL
2.0000 g | Freq: Two times a day (BID) | CUTANEOUS | Status: DC
  Administered 2021-04-02 – 2021-04-03 (×3): 2 g via TOPICAL
  Filled 2021-04-02: qty 100

## 2021-04-02 NOTE — Progress Notes (Signed)
PT Cancellation Note  Patient Details Name: Vanessa Alesi MRN: [id number] DOB: June 12, 1950   Cancelled Treatment:    Reason Eval/Treat Not Completed: Pain limiting ability to participate. Patient having significant pain in right foot and R wrist which is new today. He walked this morning with mobility specialist. Prefers to hold off at this time. Will re-attempt tomorrow.   Everette Dimauro 04/02/2021, 2:35 PM

## 2021-04-02 NOTE — Care Management Important Message (Signed)
Important Message  Patient Details  Name: Samuel [name] MRN: [id number] Date of Birth: 26-Mar-1951   Medicare Important Message Given:  Yes     Joetta Manners 04/02/2021, 1:50 PM

## 2021-04-02 NOTE — Progress Notes (Addendum)
PROGRESS NOTE    Samuel [name]  LSL:[id number] DOB: Feb 12, 1951 DOA: 03/29/2021 PCP: Dorothyann Peng, NP   Brief Narrative:   Patient 70 year old male with past medical history of bladder cancer status post TURBT, COPD, depression/anxiety, CKD stage IIIb presented to the hospital with hematuria and acute renal failure as from the labs obtained by his pulmonary physician.  Patient also had a generalized fatigue, shortness of breath.  Patient also complained of urinary frequency, intermittent hematuria and urinalysis  concerning for UTI with acute renal failure.  Initial creatinine was 6.0, BUN of 95.  CT scan of the abdomen and pelvis showed mass in the bladder with bilateral hydronephrosis.  Urology, and nephrology was consulted.  Patient underwent bilateral nephrostomy tube placement on 03/30/2021.  Assessment & Plan:   Principal Problem:   Lower obstructive uropathy Active Problems:   Essential hypertension   Hyperlipidemia   COPD (chronic obstructive pulmonary disease) (HCC)   Acute renal failure superimposed on stage 3b chronic kidney disease (HCC)   Bladder cancer (HCC)   Acute lower UTI  Acute renal failure on chronic kidney disease stage IIIb (baseline creatinine (1.1-5.7) with metabolic acidosis Secondary to obstructive uropathy.  Status post bilateral nephrostomy tube placement on 03/30/2021.  Creatinine slightly trending down.  Creatinine at 4.0 today.  Nephrology following as well.  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  Urology will follow-up with the patient once the renal function stabilizes and improved.  Likely surgical intervention as outpatient.  Continue Flomax  COPD/chronic shortness of breath -Continue current bronchodilators.  Essential Hypertension Continue Norvasc, Toprol-XL.  Latest blood pressure of 106/66    Hyperlipidemia Continue statins  Depression/anxiety Continue Celexa  UTI -Urine culture with 30,000 colonies of Proteus.  On IV Rocephin.  Completed 3-day course.  DVT prophylaxis: SCDs  Code Status: DNR  Family Communication:  Spoke with the patient's wife at bedside.  Disposition: Home  Status is: Inpatient  Remains inpatient appropriate because: Severity of illness, acute kidney injury   Consultants:  Interventional radiology: Dr.El-Abd 03/30/2021 Nephrology: Dr. Marval Regal 03/30/2021 Urology: Dr. Alyson Ingles 03/30/2021  Procedures:  CT renal stone protocol 03/30/2021 Chest x-ray 03/29/2021 Bilateral percutaneous nephrostomy tube placement per IR 03/30/2021  Antimicrobials:  IV Rocephin -completed   Subjective: Today, patient was seen and examined at bedside.  States that he did have a bowel movement.  Denies any nausea vomiting fever chills or rigor.  Denies dysuria.  Complains of right forearm and leg pain.  Objective: Vitals:   04/01/21 1948 04/02/21 0440 04/02/21 0819 04/02/21 1523  BP: 126/63 137/69 104/67 106/66  Pulse: 79 80 93 80  Resp:      Temp: 98.1 F (36.7 C) 98.4 F (36.9 C) 98.4 F (36.9 C) 99.2 F (37.3 C)  TempSrc: Oral Oral Oral Oral  SpO2: 97% 97% 98% 94%  Weight:      Height:        Intake/Output Summary (Last 24 hours) at 04/02/2021 1602 Last data filed at 04/02/2021 2620 Gross per 24 hour  Intake 3786.61 ml  Output 2550 ml  Net 1236.61 ml    Filed Weights   03/30/21 1535  Weight: 67.9 kg    Physical examination: General:  Average built, not in obvious distress HENT:   No scleral pallor or icterus noted. Oral mucosa is moist.  Chest:  Clear breath sounds.  Diminished breath sounds bilaterally. No crackles or wheezes.  CVS: S1 &S2 heard. No  murmur.  Regular rate and rhythm. Abdomen: Soft, nontender, nondistended.  Bowel sounds are heard.  Bilateral nephrostomy tube in place. Extremities: No cyanosis, clubbing or edema.   Peripheral pulses are palpable.  Left upper Extremity with lack of hand Psych: Alert, awake and oriented, normal mood CNS:  No cranial nerve deficits.  Power equal in all extremities.   Skin: Warm and dry.  No rashes noted.  Data Reviewed:  I have personally reviewed the following labs and imaging studies.    CBC: Recent Labs  Lab 03/29/21 1142 03/29/21 1852 03/31/21 0104 04/01/21 0044 04/02/21 0125  WBC 9.2 10.4 10.3 11.3* 12.9*  NEUTROABS 7.4 8.3*  --   --  10.0*  HGB 10.3* 9.7* 9.0* 8.7* 9.0*  HCT 30.7* 30.3* 27.3* 26.9* 27.2*  MCV 80.2 82.3 82.0 82.3 81.4  PLT 444.0* 408* 327 354 319     Basic Metabolic Panel: Recent Labs  Lab 03/29/21 1142 03/29/21 1852 03/31/21 0104 04/01/21 0044 04/02/21 0125  NA 137 134* 138 141 141  K 5.1 4.8 4.5 4.0 4.0  CL 105 106 112* 112* 108  CO2 19 14* 16* 20* 20*  GLUCOSE 97 110* 90 96 97  BUN 90* 95* 84* 69* 55*  CREATININE 5.90* 6.06* 5.70* 4.63* 4.08*  CALCIUM 9.5 8.8* 8.9 8.3* 8.4*  PHOS  --   --  5.8* 4.7* 4.4     GFR: Estimated Creatinine Clearance: 13.4 mL/min (A) (by C-G formula based on SCr of 4.08 mg/dL (H)).  Liver Function Tests: Recent Labs  Lab 03/31/21 0104 04/01/21 0044 04/02/21 0125  ALBUMIN 3.1* 2.9* 2.7*     CBG: No results for input(s): GLUCAP in the last 168 hours.   Recent Results (from the past 240 hour(s))  Urine Culture     Status: Abnormal   Collection Time: 03/30/21  8:26 AM   Specimen: Urine, Clean Catch  Result Value Ref Range Status   Specimen Description URINE, CLEAN CATCH  Final   Special Requests   Final    NONE Performed at [facility], [address], [place], [place] [address]    Culture 30,000 COLONIES/mL PROTEUS MIRABILIS (A)  Final   Report Status 04/01/2021 FINAL  Final   Organism ID, Bacteria PROTEUS MIRABILIS (A)  Final      Susceptibility   Proteus mirabilis - MIC*    AMPICILLIN <=2 SENSITIVE Sensitive     CEFAZOLIN <=4 SENSITIVE Sensitive     CEFEPIME  <=0.12 SENSITIVE Sensitive     CEFTRIAXONE <=0.25 SENSITIVE Sensitive     CIPROFLOXACIN <=0.25 SENSITIVE Sensitive     GENTAMICIN <=1 SENSITIVE Sensitive     IMIPENEM 1 SENSITIVE Sensitive     NITROFURANTOIN 128 RESISTANT Resistant     TRIMETH/SULFA <=20 SENSITIVE Sensitive     AMPICILLIN/SULBACTAM <=2 SENSITIVE Sensitive     PIP/TAZO <=4 SENSITIVE Sensitive     * 30,000 COLONIES/mL PROTEUS MIRABILIS  Resp Panel by RT-PCR (Flu A&B, Covid) Nasopharyngeal Swab     Status: None   Collection Time: 03/31/21  7:05 PM   Specimen: Nasopharyngeal Swab; Nasopharyngeal(NP) swabs in vial transport medium  Result Value Ref Range Status   SARS Coronavirus 2 by RT PCR NEGATIVE NEGATIVE Final    Comment: (NOTE) SARS-CoV-2 target nucleic acids are NOT DETECTED.  The SARS-CoV-2 RNA is generally detectable in upper respiratory specimens during the acute phase of infection. The lowest concentration of SARS-CoV-2 viral copies this assay can detect is 138 copies/mL. A negative result does not  preclude SARS-Cov-2 infection and should not be used as the sole basis for treatment or other patient management decisions. A negative result may occur with  improper specimen collection/handling, submission of specimen other than nasopharyngeal swab, presence of viral mutation(s) within the areas targeted by this assay, and inadequate number of viral copies(<138 copies/mL). A negative result must be combined with clinical observations, patient history, and epidemiological information. The expected result is Negative.  Fact Sheet for Patients:  EntrepreneurPulse.com.au  Fact Sheet for Healthcare Providers:  IncredibleEmployment.be  This test is no t yet approved or cleared by the [place] FDA and  has been authorized for detection and/or diagnosis of SARS-CoV-2 by FDA under an Emergency Use Authorization (EUA). This EUA will remain  in effect (meaning this test can be  used) for the duration of the COVID-19 declaration under Section 564(b)(1) of the Act, 21 U.S.C.section 360bbb-3(b)(1), unless the authorization is terminated  or revoked sooner.       Influenza A by PCR NEGATIVE NEGATIVE Final   Influenza B by PCR NEGATIVE NEGATIVE Final    Comment: (NOTE) The Xpert Xpress SARS-CoV-2/FLU/RSV plus assay is intended as an aid in the diagnosis of influenza from Nasopharyngeal swab specimens and should not be used as a sole basis for treatment. Nasal washings and aspirates are unacceptable for Xpert Xpress SARS-CoV-2/FLU/RSV testing.  Fact Sheet for Patients: EntrepreneurPulse.com.au  Fact Sheet for Healthcare Providers: IncredibleEmployment.be  This test is not yet approved or cleared by the [place] FDA and has been authorized for detection and/or diagnosis of SARS-CoV-2 by FDA under an Emergency Use Authorization (EUA). This EUA will remain in effect (meaning this test can be used) for the duration of the COVID-19 declaration under Section 564(b)(1) of the Act, 21 U.S.C. section 360bbb-3(b)(1), unless the authorization is terminated or revoked.  Performed at [facility], [place] [address] [place], [place] [address]       Radiology Studies: IR NEPHROSTOMY PLACEMENT RIGHT  Result Date: 04/02/2021 INDICATION: Bladder mass with bilateral obstructive uropathy and acute on chronic kidney injury   EXAM: 1. Ultrasound-guided puncture of a left renal calyx 2. Left antegrade nephrostogram through percutaneous access 3. Placement of a left percutaneous nephrostomy tube using fluoroscopic guidance 4. Ultrasound-guided puncture of a right renal calyx 5. Right antegrade nephrostogram to percutaneous access 6. Placement of a right percutaneous nephrostomy tube using fluoroscopic guidance   COMPARISON:  None.   MEDICATIONS: The patient received a single dose of Rocephin prior to arriving to the IR suite.    ANESTHESIA/SEDATION: Fentanyl 2 mcg IV; Versed 75 mg IV   Moderate Sedation Time:  26   The patient was continuously monitored during the procedure by the interventional radiology nurse under my direct supervision.   CONTRAST:  20 mL-administered into the collecting system(s)   FLUOROSCOPY TIME:  Fluoroscopy Time: 3 minutes 36 seconds with 7 exposures   COMPLICATIONS: None immediate.   PROCEDURE: Informed written consent was obtained from the patient after a thorough discussion of the procedural risks, benefits and alternatives. All questions were addressed. Maximal Sterile Barrier Technique was utilized including caps, mask, sterile gowns, sterile gloves, sterile drape, hand hygiene and skin antiseptic. A timeout was performed prior to the initiation of the procedure.   The patient was placed prone on the exam table. The bilateral flanks were prepped and draped in the standard sterile fashion. Attention was first turned to the left side. Ultrasound was used to evaluate the left kidney, which demonstrated moderate to severe  hydronephrosis. Skin entry site was marked, and local analgesia was obtained with 1% lidocaine. Using ultrasound guidance, an appropriate lower pole posterior calyx was punctured using a 21-gauge Chiba needle. Entry into the collecting system was confirmed with return of urine, and gentle injection of contrast material under fluoroscopy opacifying the proximal collecting system. An 018 wire was advanced through the needle into the renal pelvis and proximal ureter, followed by placement of a transition dilator. An antegrade nephrostogram was then performed, which demonstrated moderate to severe hydronephrosis. Over an 035 Amplatz wire, the percutaneous tract was serially dilated, and a 10.2 French nephrostomy tube was advanced with the loop formed in the renal pelvis. Appropriate positioning of the nephrostomy tube was confirmed with injection of contrast material. The nephrostomy tube was secured  to skin using silk suture and a dressing. It was attached to bag drainage.   Attention was then turned to the contralateral right side. The patient was placed prone on the exam table. The right flank was prepped and draped in the standard sterile fashion. Ultrasound was used to evaluate the side kidney, which demonstrated moderate to severe hydronephrosis. Skin entry site was marked, and local analgesia was obtained with 1% lidocaine. Using ultrasound guidance, an appropriate lower pole posterior calyx was punctured using a 21-gauge Chiba needle. Entry into the collecting system was confirmed with return of urine, and gentle injection of contrast material under fluoroscopy opacifying the proximal collecting system. An 018 wire was advanced through the needle into the renal pelvis and proximal ureter, followed by placement of a transition dilator. An antegrade nephrostogram was then performed, which demonstrated moderate to severe hydronephrosis. Over an 035 Amplatz wire, the percutaneous tract was serially dilated, and a 10.2 French nephrostomy tube was advanced with the loop formed in the renal pelvis. Appropriate positioning of the nephrostomy tube was confirmed with injection of contrast material. The nephrostomy tube was secured to skin using silk suture and a dressing. It was attached to bag drainage.   The patient tolerated the procedure well without immediate complication, and was transferred to recovery in stable condition.   IMPRESSION: 1. Successful placement of bilateral 10.2 French percutaneous nephrostomy tubes using ultrasound and fluoroscopic guidance for treatment of obstructive uropathy secondary to bladder mass. 2. Nephrostomy tubes placed to bag drainage. The patient will return to Interventional Radiology in approximately 4-6 weeks for routine exchange pending further operative plans.     Electronically Signed   By: Albin Felling M.D.   On: 03/30/2021 13:42      Scheduled Meds:  amLODipine   10 mg Oral Daily   atorvastatin  40 mg Oral Daily   citalopram  20 mg Oral Daily   diclofenac Sodium  2 g Topical BID   docusate sodium  100 mg Oral BID   finasteride  5 mg Oral Daily   umeclidinium bromide  1 puff Inhalation Daily   And   fluticasone furoate-vilanterol  1 puff Inhalation Daily   metoprolol succinate  25 mg Oral Daily   sodium bicarbonate  650 mg Oral BID   tamsulosin  0.8 mg Oral QHS   Continuous Infusions:  lactated ringers 75 mL/hr at 04/02/21 0553     LOS: 3 days    Flora Lipps, MD [facility] 04/02/2021, 4:02 PM

## 2021-04-02 NOTE — Evaluation (Addendum)
Occupational Therapy Evaluation and Discharge Patient Details Name: Samuel [name] MRN: [id number] DOB: 09-22-1950 Today's Date: 04/02/2021   History of Present Illness Samuel [name] is a 70 y.o. male presenting with hematuria/renal failure on outpatient labs. Pt found to have large posterior bladder obstructing both distal ureters with bilateral hydronephrosis s/p bilateral nephrostomy tube placement on 11.18.22. PHMx:  congential missing left hand; bladder cancer; COPD;  depression/anxiety; stage 3 CKD; HTN; and HLD   Clinical Impression   This 70 yo male admitted with above presents to acute OT with overall S-min A level for basic ADLs and when up on his feet due to new right foot pain as of today. Also having right wrist pain (new today). Pt does display SOB with exertion, recommended he use the shower seat his wife normally uses to help with endurance for showering--he verbalized understanding. His wife is with him at home and can A prn, no further OT needs, we will D/C from acute OT.      Recommendations for follow up therapy are one component of a multi-disciplinary discharge planning process, led by the attending physician.  Recommendations may be updated based on patient status, additional functional criteria and insurance authorization.   Follow Up Recommendations  No OT follow up    Assistance Recommended at Discharge Frequent or constant Supervision/Assistance  Functional Status Assessment  Patient has had a recent decline in their functional status and demonstrates the ability to make significant improvements in function in a reasonable and predictable amount of time.  Equipment Recommendations  None recommended by OT       Precautions / Restrictions Precautions Precaution Comments: Bil nephrostomy tubes Restrictions Weight Bearing Restrictions: No      Mobility Bed Mobility               General bed mobility comments: Up in recliner upon my  arrival    Transfers Overall transfer level: Needs assistance Equipment used: None Transfers: Sit to/from Stand Sit to Stand: Min guard                  Balance Overall balance assessment: Needs assistance Sitting-balance support: No upper extremity supported;Feet supported Sitting balance-Leahy Scale: Good     Standing balance support: No upper extremity supported Standing balance-Leahy Scale: Fair                             ADL either performed or assessed with clinical judgement   ADL Overall ADL's : Needs assistance/impaired Eating/Feeding: Sitting;Set up   Grooming: Sitting;Set up   Upper Body Bathing: Set up;Sitting   Lower Body Bathing: Minimal assistance Lower Body Bathing Details (indicate cue type and reason): S sit<>stand Upper Body Dressing : Minimal assistance;Sitting   Lower Body Dressing: Minimal assistance Lower Body Dressing Details (indicate cue type and reason): S sit<>stand, A for socks only Toilet Transfer: Supervision/safety;Ambulation;Comfort height toilet   Toileting- Clothing Manipulation and Hygiene: Minimal assistance Toileting - Clothing Manipulation Details (indicate cue type and reason): S sit<>stand       General ADL Comments: Needs additional A than baseline due to right foot pain, right wrist pain, and cannot bend over to get to feet due to increased work of breathing when he bends foreward.     Vision Patient Visual Report: No change from baseline              Pertinent Vitals/Pain Pain Assessment: Faces Faces Pain Scale: Hurts little more  Pain Location: RLE (top of forefoot, more lateral than medial, RUE wrist Pain Descriptors / Indicators: Aching;Sore Pain Intervention(s): Limited activity within patient's tolerance;Monitored during session;Repositioned     Hand Dominance Right   Extremity/Trunk Assessment Upper Extremity Assessment Upper Extremity Assessment: RUE deficits/detail RUE Deficits /  Details: Can move and use RUE, but wrist is really sore and swollen laterally (red as well, but not hot) RUE Coordination: WNL           Communication Communication Communication: No difficulties   Cognition Arousal/Alertness: Awake/alert Behavior During Therapy: WFL for tasks assessed/performed Overall Cognitive Status: Within Functional Limits for tasks assessed                                                  Home Living Family/patient expects to be discharged to:: Private residence Living Arrangements: Spouse/significant other Available Help at Discharge: Family;Available 24 hours/day Type of Home: House Home Access: Level entry     Home Layout: One level     Bathroom Shower/Tub: Tub/shower unit;Curtain   Biochemist, clinical: Standard     Home Equipment: Shower seat          Prior Functioning/Environment Prior Level of Function : Independent/Modified Independent                        OT Problem List: Impaired balance (sitting and/or standing);Pain         OT Goals(Current goals can be found in the care plan section) Acute Rehab OT Goals Patient Stated Goal: to figure out why right foot and wrist are now hurting                AM-PAC OT "6 Clicks" Daily Activity     Outcome Measure Help from another person eating meals?: None Help from another person taking care of personal grooming?: A Little Help from another person toileting, which includes using toliet, bedpan, or urinal?: A Little Help from another person bathing (including washing, rinsing, drying)?: A Little Help from another person to put on and taking off regular upper body clothing?: A Little Help from another person to put on and taking off regular lower body clothing?: A Little 6 Click Score: 19   End of Session Equipment Utilized During Treatment: Gait belt  Activity Tolerance: Patient tolerated treatment well Patient left: in chair;with call bell/phone within  reach;with family/visitor present  OT Visit Diagnosis: Unsteadiness on feet (R26.81);Other abnormalities of gait and mobility (R26.89);Pain Pain - Right/Left: Right Pain - part of body:  (foot and wrist)                Time: 1043-1101 OT Time Calculation (min): 18 min Charges:  OT General Charges $OT Visit: 1 Visit OT Evaluation $OT Eval Moderate Complexity: 1 Mod  [address], OTR/L Acute [employer] Pager [phone] Office [phone]    Almon Register 04/02/2021, 12:32 PM

## 2021-04-02 NOTE — Progress Notes (Signed)
Patient ID: Samuel [name], male   DOB: 21-Oct-1950, 70 y.o.   MRN: [id number] [facility] Progress Note   Assessment/ Plan:   1. Acute kidney Injury on chronic kidney disease stage IIIb: Secondary to recurrent urothelial cancer with obstructive uropathy.  Now status post bilateral nephrostomy tubes and with improving renal function/downtrending creatinine/BUN noted on labs.  Urine output is in the nonoliguric range and he does not have any acute indications for dialysis at this time.  Continue periodic lab monitoring to assess for renal recovery/need for intervention. 2.  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 3.  Anion gap metabolic acidosis: Secondary to acute kidney injury and likely distal tubular dysfunction from obstruction.  Continue sodium bicarbonate supplementation at this time until sufficient recovery seen. 4.  Anemia of chronic disease: Likely secondary to urinary losses, continue to hold ESA with recurrent malignancy. 5.  Right wrist/foot pain: Unclear etiology but likely OA-does not appear to be consistent with gout, will treat with topical diclofenac.  Subjective:   Complains of some right-sided foot and wrist pain.   Objective:   BP 104/67 (BP Location: Left Arm)   Pulse 93   Temp 98.4 F (36.9 C) (Oral)   Resp 18   Ht 5\' 3"  (1.6 m)   Wt 67.9 kg   SpO2 98%   BMI 26.51 kg/m   Intake/Output Summary (Last 24 hours) at 04/02/2021 1120 Last data filed at 04/02/2021 8786 Gross per 24 hour  Intake 4086.61 ml  Output 2550 ml  Net 1536.61 ml   Weight change:   Physical Exam: Gen: Comfortably sitting up in recliner, wife and daughter at bedside CVS: Pulse regular rhythm, normal rate, S1 and S2 normal Resp: Clear to auscultation bilaterally, no  rales/rhonchi Abd: Soft, obese, nontender Ext: No lower extremity edema.  Right wrist with mild erythema/swelling but no tenderness to touch.  Left ankle without significant swelling.  Imaging: IR NEPHROSTOMY PLACEMENT RIGHT  Result Date: 04/02/2021 INDICATION: Bladder mass with bilateral obstructive uropathy and acute on chronic kidney injury   EXAM: 1. Ultrasound-guided puncture of a left renal calyx 2. Left antegrade nephrostogram through percutaneous access 3. Placement of a left percutaneous nephrostomy tube using fluoroscopic guidance 4. Ultrasound-guided puncture of a right renal calyx 5. Right antegrade nephrostogram to percutaneous access 6. Placement of a right percutaneous nephrostomy tube using fluoroscopic guidance   COMPARISON:  None.   MEDICATIONS: The patient received a single dose of Rocephin prior to arriving to the IR suite.   ANESTHESIA/SEDATION: Fentanyl 2 mcg IV; Versed 75 mg IV   Moderate Sedation Time:  20   The patient was continuously monitored during the procedure by the interventional radiology nurse under my direct supervision.   CONTRAST:  20 mL-administered into the collecting system(s)   FLUOROSCOPY TIME:  Fluoroscopy Time: 3 minutes 36 seconds with 7 exposures   COMPLICATIONS: None immediate.   PROCEDURE: Informed written consent was obtained from the patient after a thorough discussion of the procedural risks, benefits and alternatives. All questions were addressed. Maximal Sterile Barrier Technique was utilized including caps, mask, sterile gowns, sterile gloves, sterile drape, hand hygiene and skin antiseptic. A timeout was performed prior to the initiation of the procedure.   The patient was placed prone on the exam table. The bilateral flanks were prepped and draped in the standard sterile fashion. Attention was first turned  to the left side. Ultrasound was used to evaluate the left kidney, which demonstrated moderate to severe hydronephrosis. Skin entry site was marked,  and local analgesia was obtained with 1% lidocaine. Using ultrasound guidance, an appropriate lower pole posterior calyx was punctured using a 21-gauge Chiba needle. Entry into the collecting system was confirmed with return of urine, and gentle injection of contrast material under fluoroscopy opacifying the proximal collecting system. An 018 wire was advanced through the needle into the renal pelvis and proximal ureter, followed by placement of a transition dilator. An antegrade nephrostogram was then performed, which demonstrated moderate to severe hydronephrosis. Over an 035 Amplatz wire, the percutaneous tract was serially dilated, and a 10.2 French nephrostomy tube was advanced with the loop formed in the renal pelvis. Appropriate positioning of the nephrostomy tube was confirmed with injection of contrast material. The nephrostomy tube was secured to skin using silk suture and a dressing. It was attached to bag drainage.   Attention was then turned to the contralateral right side. The patient was placed prone on the exam table. The right flank was prepped and draped in the standard sterile fashion. Ultrasound was used to evaluate the side kidney, which demonstrated moderate to severe hydronephrosis. Skin entry site was marked, and local analgesia was obtained with 1% lidocaine. Using ultrasound guidance, an appropriate lower pole posterior calyx was punctured using a 21-gauge Chiba needle. Entry into the collecting system was confirmed with return of urine, and gentle injection of contrast material under fluoroscopy opacifying the proximal collecting system. An 018 wire was advanced through the needle into the renal pelvis and proximal ureter, followed by placement of a transition dilator. An antegrade nephrostogram was then performed, which demonstrated moderate to severe hydronephrosis. Over an 035 Amplatz wire, the percutaneous tract was serially dilated, and a 10.2 French nephrostomy tube was advanced with  the loop formed in the renal pelvis. Appropriate positioning of the nephrostomy tube was confirmed with injection of contrast material. The nephrostomy tube was secured to skin using silk suture and a dressing. It was attached to bag drainage.   The patient tolerated the procedure well without immediate complication, and was transferred to recovery in stable condition.   IMPRESSION: 1. Successful placement of bilateral 10.2 French percutaneous nephrostomy tubes using ultrasound and fluoroscopic guidance for treatment of obstructive uropathy secondary to bladder mass. 2. Nephrostomy tubes placed to bag drainage. The patient will return to Interventional Radiology in approximately 4-6 weeks for routine exchange pending further operative plans.     Electronically Signed   By: Albin Felling M.D.   On: 03/30/2021 13:42     Labs: BMET Recent Labs  Lab 03/29/21 1142 03/29/21 1852 03/31/21 0104 04/01/21 0044 04/02/21 0125  NA 137 134* 138 141 141  K 5.1 4.8 4.5 4.0 4.0  CL 105 106 112* 112* 108  CO2 19 14* 16* 20* 20*  GLUCOSE 97 110* 90 96 97  BUN 90* 95* 84* 69* 55*  CREATININE 5.90* 6.06* 5.70* 4.63* 4.08*  CALCIUM 9.5 8.8* 8.9 8.3* 8.4*  PHOS  --   --  5.8* 4.7* 4.4   CBC Recent Labs  Lab 03/29/21 1142 03/29/21 1852 03/31/21 0104 04/01/21 0044 04/02/21 0125  WBC 9.2 10.4 10.3 11.3* 12.9*  NEUTROABS 7.4 8.3*  --   --  10.0*  HGB 10.3* 9.7* 9.0* 8.7* 9.0*  HCT 30.7* 30.3* 27.3* 26.9* 27.2*  MCV 80.2 82.3 82.0 82.3 81.4  PLT 444.0* 408* 327 354 319  Medications:     amLODipine  10 mg Oral Daily   atorvastatin  40 mg Oral Daily   citalopram  20 mg Oral Daily   docusate sodium  100 mg Oral BID   finasteride  5 mg Oral Daily   umeclidinium bromide  1 puff Inhalation Daily   And   fluticasone furoate-vilanterol  1 puff Inhalation Daily   metoprolol succinate  25 mg Oral Daily   sodium bicarbonate  650 mg Oral BID   tamsulosin  0.8 mg Oral QHS   Elmarie Shiley, MD 04/02/2021,  11:20 AM

## 2021-04-02 NOTE — Progress Notes (Signed)
Mobility Specialist Progress Note:   04/02/21 0945  Mobility  Activity Ambulated in hall  Level of Assistance Independent  Assistive Device None  Distance Ambulated (ft) 120 ft  Mobility Ambulated independently in hallway  Mobility Response Tolerated fair  Mobility performed by Mobility specialist  Bed Position Chair  $Mobility charge 1 Mobility   Pt c/o R foot pain this am. Diplayed SOB however, SpO2 stayed at 93% during ambulation. Pt sitting up in chair.  Nelta Numbers Mobility Specialist  Phone [phone]

## 2021-04-03 ENCOUNTER — Inpatient Hospital Stay (HOSPITAL_COMMUNITY): Payer: Medicare Other

## 2021-04-03 DIAGNOSIS — N390 Urinary tract infection, site not specified: Secondary | ICD-10-CM | POA: Diagnosis not present

## 2021-04-03 DIAGNOSIS — N179 Acute kidney failure, unspecified: Secondary | ICD-10-CM | POA: Diagnosis not present

## 2021-04-03 DIAGNOSIS — N139 Obstructive and reflux uropathy, unspecified: Secondary | ICD-10-CM | POA: Diagnosis not present

## 2021-04-03 DIAGNOSIS — C679 Malignant neoplasm of bladder, unspecified: Secondary | ICD-10-CM | POA: Diagnosis not present

## 2021-04-03 LAB — CBC
HCT: 27.3 % — ABNORMAL LOW (ref 39.0–52.0)
Hemoglobin: 9 g/dL — ABNORMAL LOW (ref 13.0–17.0)
MCH: 27.3 pg (ref 26.0–34.0)
MCHC: 33 g/dL (ref 30.0–36.0)
MCV: 82.7 fL (ref 80.0–100.0)
Platelets: 350 10*3/uL (ref 150–400)
RBC: 3.3 MIL/uL — ABNORMAL LOW (ref 4.22–5.81)
RDW: 17.2 % — ABNORMAL HIGH (ref 11.5–15.5)
WBC: 13.7 10*3/uL — ABNORMAL HIGH (ref 4.0–10.5)
nRBC: 0 % (ref 0.0–0.2)

## 2021-04-03 LAB — RENAL FUNCTION PANEL
Albumin: 2.7 g/dL — ABNORMAL LOW (ref 3.5–5.0)
Anion gap: 9 (ref 5–15)
BUN: 49 mg/dL — ABNORMAL HIGH (ref 8–23)
CO2: 22 mmol/L (ref 22–32)
Calcium: 8.3 mg/dL — ABNORMAL LOW (ref 8.9–10.3)
Chloride: 108 mmol/L (ref 98–111)
Creatinine, Ser: 3.73 mg/dL — ABNORMAL HIGH (ref 0.61–1.24)
GFR, Estimated: 17 mL/min — ABNORMAL LOW (ref >60)
Glucose, Bld: 107 mg/dL — ABNORMAL HIGH (ref 70–99)
Phosphorus: 4.8 mg/dL — ABNORMAL HIGH (ref 2.5–4.6)
Potassium: 4 mmol/L (ref 3.5–5.1)
Sodium: 139 mmol/L (ref 135–145)

## 2021-04-03 LAB — MAGNESIUM: Magnesium: 1.3 mg/dL — ABNORMAL LOW (ref 1.7–2.4)

## 2021-04-03 LAB — URIC ACID: Uric Acid, Serum: 5.6 mg/dL (ref 3.7–8.6)

## 2021-04-03 MED ORDER — SODIUM BICARBONATE 650 MG PO TABS: 650.0000 mg | ORAL_TABLET | Freq: Two times a day (BID) | ORAL | 0 refills | Status: DC

## 2021-04-03 MED ORDER — ACETAMINOPHEN 325 MG PO TABS: 650.0000 mg | ORAL_TABLET | Freq: Four times a day (QID) | ORAL | 0 refills | Status: AC | PRN

## 2021-04-03 MED ORDER — METHYLPREDNISOLONE SODIUM SUCC 40 MG IJ SOLR
40.0000 mg | Freq: Once | INTRAMUSCULAR | Status: AC
  Administered 2021-04-03: 40 mg via INTRAVENOUS
  Filled 2021-04-03: qty 1

## 2021-04-03 MED ORDER — DICLOFENAC SODIUM 1 % EX GEL: 2.0000 g | Freq: Two times a day (BID) | CUTANEOUS | 0 refills | Status: DC

## 2021-04-03 MED ORDER — MAGNESIUM SULFATE 4 GM/100ML IV SOLN
4.0000 g | Freq: Once | INTRAVENOUS | Status: AC
  Administered 2021-04-03: 4 g via INTRAVENOUS
  Filled 2021-04-03: qty 100

## 2021-04-03 MED ORDER — OXYCODONE HCL 5 MG PO TABS: 5.0000 mg | ORAL_TABLET | Freq: Four times a day (QID) | ORAL | 0 refills | Status: DC | PRN

## 2021-04-03 NOTE — Progress Notes (Signed)
Patient continue to complain of his right hand pain voltaren gel applied and PRN med given but still not helping, on-call notified he ordered x-ray. We continue to monitor.

## 2021-04-03 NOTE — TOC Initial Note (Addendum)
Transition of Care [facility]) - Initial/Assessment Note    Patient Details  Name: Samuel [name] MRN: [id number] Date of Birth: 1951-04-20  [facility] CM/SW Contact:    Marilu Favre, RN Phone Number: 04/03/2021, 11:33 AM  Clinical Narrative:                 PT secure chatted NCM regarding cost of DME: shower chair , stand up Rollator , pulse oximeter and bilateral platform walker.   NCM spoke to [place] with [place]. Shower chair , stand up Rollator , and pulse oximeter are not covered by insurance. Bilateral platform walker is covered by insurance at 80% patient responsible for 20%. NCM asked how much 20% would be. NCM was told would not know until submitted to insurance.   Patient and wife aware of above. They will order shower chair on [place], and decide on bilateral platform walker closer to discharge.   NCM secure chatted MD regarding discharge date, awaiting response.    NCM confirmed face sheet information. They best contact number for home health to call is [place] [phone].  [name] with [facility] can accept referral for home health PT and home health RN. WIll need orders and face to face    Patient has home oxygen already through [place], usually just wears at night time, does have portable tanks if needed.   Patient also has NEB machine at home.    1310 Patient being discharged today. Followed up with patient and wife, their son in law has ordered bilateral platform walker, pulse oximeter and shower chair.   Expected Discharge Plan: [place] Barriers to Discharge: Continued Medical Work up   Patient Goals and CMS Choice Patient states their goals for this hospitalization and ongoing recovery are:: to return to home CMS Medicare.gov Compare Post Acute Care list provided to:: Patient Choice offered to / list presented to : Patient, Spouse  Expected Discharge Plan and Services Expected Discharge Plan: [place]   Discharge Planning Services: CM Consult Post Acute Care Choice: Home Health, Durable Medical Equipment Living arrangements for the past 2 months: [place]                   DME Agency: [employer] Date DME Agency Contacted: 04/03/21 Time DME Agency Contacted: [id number] Representative spoke with at DME Agency: Freda Munro HH Arranged: PT, RN [place] Agency: [place] Date [place]: 04/03/21 Time [place]: 1132 Representative spoke with at [place]: [name]  Prior Living Arrangements/Services Living arrangements for the past 2 months: [place] Lives with:: Spouse Patient language and need for interpreter reviewed:: Yes Do you feel safe going back to the place where you live?: Yes      Need for Family Participation in Patient Care: Yes (Comment) Care giver support system in place?: Yes (comment) Current home services: DME Criminal Activity/Legal Involvement Pertinent to Current Situation/Hospitalization: No - Comment as needed  Activities of Daily Living Home Assistive Devices/Equipment: None ADL Screening (condition at time of admission) Patient's cognitive ability adequate to safely complete daily activities?: Yes Is the patient deaf or have difficulty hearing?: No Does the patient have difficulty seeing, even when wearing glasses/contacts?: No Does the patient have difficulty concentrating, remembering, or making decisions?: No Patient able to express need for assistance with ADLs?: Yes Does the patient have difficulty dressing or bathing?: No Independently performs ADLs?: Yes (appropriate for developmental age)  Does the patient have difficulty walking or climbing stairs?: No Weakness of Legs: None Weakness of Arms/Hands: None  Permission Sought/Granted   Permission granted to share information with : Yes, Verbal Permission Granted  Share Information with NAME: wife Earlie Server           Emotional  Assessment Appearance:: Appears stated age Attitude/Demeanor/Rapport: Engaged Affect (typically observed): Accepting Orientation: : Oriented to Self, Oriented to Place, Oriented to  Time, Oriented to Situation Alcohol / Substance Use: Not Applicable Psych Involvement: No (comment)  Admission diagnosis:  Lower obstructive uropathy [N13.9] AKI (acute kidney injury) ([place]) [N17.9] Hematuria, unspecified type [R31.9] Patient Active Problem List   Diagnosis Date Noted   Acute lower UTI 03/31/2021   Lower obstructive uropathy 03/30/2021   Acute renal failure superimposed on stage 3b chronic kidney disease ([place]) 03/30/2021   Bladder cancer ([place]) 03/30/2021   Lightheadedness 03/29/2021   Centrilobular emphysema ([place])    COPD (chronic obstructive pulmonary disease) ([place])    Nocturnal hypoxemia due to emphysema (H[facility])    Shortness of breath on exertion    Hyperlipidemia 12/19/2017   Depression    Essential hypertension 11/15/2016   Tobacco use 11/15/2016   BPH without urinary obstruction 11/15/2016   PCP:  Dorothyann Peng, NP Pharmacy:   [facility] [place], [place] - [address] [place] [address] Phone: [phone] Fax: [phone]     Social Determinants of Health (SDOH) Interventions    Readmission Risk Interventions No flowsheet data found.

## 2021-04-03 NOTE — Discharge Summary (Signed)
Physician Discharge Summary  Samuel [name] ZCH:[id number] DOB: 1951/03/06 DOA: 03/29/2021  PCP: Dorothyann Peng, NP  Admit date: 03/29/2021 Discharge date: 04/03/2021  Admitted From: Home  Discharge disposition: Home with home health  Recommendations for Outpatient Follow-Up:   Follow up with your primary care provider in one week. Check CBC, BMP, magnesium in the next visit Patient will need to follow-up with urology as has been scheduled for discussion of bladder tumor. Follow-up with nephrology Dr. Royce Macadamia on 04/20/2021.  Discharge Diagnosis:   Principal Problem:   Lower obstructive uropathy Active Problems:   Essential hypertension   Hyperlipidemia   COPD (chronic obstructive pulmonary disease) (HCC)   Acute renal failure superimposed on stage 3b chronic kidney disease (HCC)   Bladder cancer (HCC)   Acute lower UTI   Discharge Condition: Improved.  Diet recommendation: Low sodium, heart healthy.    Wound care: None.  Code status: DNR  History of Present Illness:   Patient 70 year old male with past medical history of bladder cancer status post TURBT, COPD, depression/anxiety, CKD stage IIIb presented to the hospital with hematuria and acute renal failure as from the labs obtained by his pulmonary physician.  Patient also had a generalized fatigue, shortness of breath.  Patient also complained of urinary frequency, intermittent hematuria and urinalysis  concerning for UTI with acute renal failure.  Initial creatinine was 6.0, BUN of 95.  CT scan of the abdomen and pelvis showed mass in the bladder with bilateral hydronephrosis.  Urology, and nephrology was consulted and patient was admitted hospital for further evaluation and treatment.  Hospital Course:   Following conditions were addressed during hospitalization as listed below,  Acute renal failure on chronic kidney disease stage IIIb (baseline creatinine (2.8-7.8) with metabolic acidosis  Secondary  to obstructive uropathy.  Status post bilateral nephrostomy tube placement on 03/30/2021.  Creatinine continues to trend down.  Nephrology has followed the patient at this time.  Okay from nephrology point of view for disposition.  Patient will be continued on sodium bicarbonate tablets until follow-up with nephrology as outpatient.  Patient was advised to get BMP in 1 week with primary care provider.  Mild tenderness and erythema of the right hand.  X-ray of the right hand showed some osteoarthritis.  No evidence of cellulitis.  Uric acid level was sent.  Patient has received 1 dose of IV Solu-Medrol.  Patient will be given analgesia in the clinic cream on discharge.  Please follow-up with primary care physician after discharge.   Obstructive uropathy with hydronephrosis/probable urothelial cancer/bladder mass Likely secondary to recurrent urothelial cancer.  Urology will follow-up with the patient once the renal function stabilizes and improved.  Appointment has been scheduled as an outpatient, likely surgical intervention as outpatient.  Continue Flomax on discharge  COPD/chronic shortness of breath -Continue current bronchodilators on discharge.  Essential Hypertension Continue Norvasc, Toprol-XL.    Hyperlipidemia Continue statins  Depression/anxiety Continue Celexa  UTI -Urine culture with 30,000 colonies of Proteus.  Patient received IV Rocephin.  Completed 3-day course.  Disposition.  At this time, patient is stable for disposition home with home health.  Patient will follow-up with urology, primary care physician, nephrology as outpatient.  Spoke with the patient's wife regarding plan for disposition.  Medical Consultants:   Urology Interventional radiology Nephrology  Procedures:    Bilateral percutaneous nephrostomy tube placement per IR 03/30/2021 Subjective:   Today, patient was seen and examined at bedside.  Patient complains of mild redness and pain over the right  hand  Discharge Exam:   Vitals:   04/03/21 0444 04/03/21 0812  BP: 113/66 122/63  Pulse: 81 85  Resp:  18  Temp: 98.4 F (36.9 C) 98.4 F (36.9 C)  SpO2: 92% 93%   Vitals:   04/02/21 1523 04/02/21 2226 04/03/21 0444 04/03/21 0812  BP: 106/66 121/72 113/66 122/63  Pulse: 80 82 81 85  Resp:  18  18  Temp: 99.2 F (37.3 C) 98.2 F (36.8 C) 98.4 F (36.9 C) 98.4 F (36.9 C)  TempSrc: Oral  Oral Oral  SpO2: 94% 91% 92% 93%  Weight:      Height:       General: Alert awake, not in obvious distress HENT: pupils equally reacting to light,  No scleral pallor or icterus noted. Oral mucosa is moist.  Chest:  Clear breath sounds.  Diminished breath sounds bilaterally. No crackles or wheezes.  CVS: S1 &S2 heard. No murmur.  Regular rate and rhythm. Abdomen: Soft, nontender, nondistended.  Bowel sounds are heard.   Extremities: No cyanosis, clubbing or edema.  Peripheral pulses are palpable.  Mild erythema and tenderness over the dorsum of the right hand.  Absent hand on the left side. Psych: Alert, awake and oriented, normal mood CNS:  No cranial nerve deficits.  Power equal in all extremities.   Skin: Warm and dry.  No rashes noted.  The results of significant diagnostics from this hospitalization (including imaging, microbiology, ancillary and laboratory) are listed below for reference.     Diagnostic Studies:   DG Chest 2 View  Result Date: 03/29/2021 CLINICAL DATA:  Shortness of breath EXAM: CHEST - 2 VIEW COMPARISON:  Chest x-ray dated June 08, 2020 FINDINGS: Cardiac and mediastinal contours are unchanged. Bilateral linear opacities, likely related to underlying emphysema. No new parenchymal process. No large pleural effusion or evidence of pneumothorax. IMPRESSION: Chronic findings related to emphysema.  No focal opacity. Electronically Signed   By: Yetta Glassman M.D.   On: 03/29/2021 12:01   CT Renal Stone Study  Result Date: 03/30/2021 CLINICAL DATA:  70 year old  male with gross hematuria. Acute renal insufficiency. EXAM: CT ABDOMEN AND PELVIS WITHOUT CONTRAST TECHNIQUE: Multidetector CT imaging of the abdomen and pelvis was performed following the standard protocol without IV contrast. COMPARISON:  Chest CT 09/27/2020. FINDINGS: Lower chest: Emphysema. Stable lung bases, mild scarring. No cardiomegaly, pericardial effusion or pleural effusion. Hepatobiliary: Negative noncontrast liver and gallbladder. Pancreas: Negative. Spleen: Negative. Adrenals/Urinary Tract: Normal adrenal glands. Severe bilateral hydronephrosis and hydroureter. A degree of chronic atrophy of the left kidney is suspected. There are superimposed exophytic but probable benign bilateral renal cysts. Tortuous ureters remain dilated to the ureterovesical junctions, where a fungating, lobulated intermediate density posterior bladder mass is identified. See series 3, image 65 and coronal image 88. The mass is eccentric to the left, but appears to extend across midline posteriorly (series 3, image 67) and encompasses 27 by 50 by 65 mm (AP by transverse by CC). Bilateral distal hydroureter is 13-14 mm diameter. Relatively normal bladder volume. Note also superimposed posterior bladder fundus nodularity (series 7, image 68), although might be trabeculation. Stomach/Bowel: Severe diverticulosis of the sigmoid colon, but no active inflammation. Redundant large bowel with mild retained stool throughout. Normal retrocecal appendix. Decompressed and negative terminal ileum. No dilated small bowel. Decompressed stomach. Negative duodenum. No free air or free fluid. Vascular/Lymphatic: Extensive Aortoiliac calcified atherosclerosis. Normal caliber abdominal aorta. Maximal to mildly enlarged left retroperitoneal lymph node at the pelvic inlet series 3, image  46, 11 mm short axis. Right greater than left iliac Chain pelvic lymphadenopathy, up to 17 mm short axis on the right. Reproductive: Negative aside from urinary  bladder abnormality. Other: No pelvic free fluid. Musculoskeletal: Degeneration in the spine. No acute osseous abnormality identified. IMPRESSION: 1. Large posterior bladder obstructing both distal ureters at the UVJs. 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 Associated pelvic and retroperitoneal lymphadenopathy highly suspicious for nodal metastatic disease. And note also additional nodularity of the posterior bladder fundus. 2. Severe bilateral hydronephrosis and hydroureter due to #1. 3. Aortic Atherosclerosis (ICD10-I70.0) and Emphysema (ICD10-J43.9). Electronically Signed   By: Genevie Ann M.D.   On: 03/30/2021 07:24   IR NEPHROSTOMY PLACEMENT LEFT  Result Date: 03/30/2021 INDICATION: Bladder mass with bilateral obstructive uropathy and acute on chronic kidney injury EXAM: 1. Ultrasound-guided puncture of a left renal calyx 2. Left antegrade nephrostogram through percutaneous access 3. Placement of a left percutaneous nephrostomy tube using fluoroscopic guidance 4. Ultrasound-guided puncture of a right renal calyx 5. Right antegrade nephrostogram to percutaneous access 6. Placement of a right percutaneous nephrostomy tube using fluoroscopic guidance COMPARISON:  None. MEDICATIONS: The patient received a single dose of Rocephin prior to arriving to the IR suite. ANESTHESIA/SEDATION: Fentanyl 2 mcg IV; Versed 75 mg IV Moderate Sedation Time:  65 The patient was continuously monitored during the procedure by the interventional radiology nurse under my direct supervision. CONTRAST:  20 mL-administered into the collecting system(s) FLUOROSCOPY TIME:  Fluoroscopy Time: 3 minutes 36 seconds with 7 exposures COMPLICATIONS: None immediate. PROCEDURE: Informed written consent was obtained from the patient after a thorough discussion of the procedural risks, benefits and alternatives. All questions were addressed. Maximal Sterile Barrier Technique was utilized including caps, mask, sterile gowns, sterile gloves,  sterile drape, hand hygiene and skin antiseptic. A timeout was performed prior to the initiation of the procedure. The patient was placed prone on the exam table. The bilateral flanks were prepped and draped in the standard sterile fashion. Attention was first turned to the left side. Ultrasound was used to evaluate the left kidney, which demonstrated moderate to severe hydronephrosis. Skin entry site was marked, and local analgesia was obtained with 1% lidocaine. Using ultrasound guidance, an appropriate lower pole posterior calyx was punctured using a 21-gauge Chiba needle. Entry into the collecting system was confirmed with return of urine, and gentle injection of contrast material under fluoroscopy opacifying the proximal collecting system. An 018 wire was advanced through the needle into the renal pelvis and proximal ureter, followed by placement of a transition dilator. An antegrade nephrostogram was then performed, which demonstrated moderate to severe hydronephrosis. Over an 035 Amplatz wire, the percutaneous tract was serially dilated, and a 10.2 French nephrostomy tube was advanced with the loop formed in the renal pelvis. Appropriate positioning of the nephrostomy tube was confirmed with injection of contrast material. The nephrostomy tube was secured to skin using silk suture and a dressing. It was attached to bag drainage. Attention was then turned to the contralateral right side. The patient was placed prone on the exam table. The right flank was prepped and draped in the standard sterile fashion. Ultrasound was used to evaluate the side kidney, which demonstrated moderate to severe hydronephrosis. Skin entry site was marked, and local analgesia was obtained with 1% lidocaine. Using ultrasound guidance, an appropriate lower pole posterior calyx was punctured using a 21-gauge Chiba needle. Entry into the collecting system was confirmed with return of urine, and gentle injection of contrast  material  under fluoroscopy opacifying the proximal collecting system. An 018 wire was advanced through the needle into the renal pelvis and proximal ureter, followed by placement of a transition dilator. An antegrade nephrostogram was then performed, which demonstrated moderate to severe hydronephrosis. Over an 035 Amplatz wire, the percutaneous tract was serially dilated, and a 10.2 French nephrostomy tube was advanced with the loop formed in the renal pelvis. Appropriate positioning of the nephrostomy tube was confirmed with injection of contrast material. The nephrostomy tube was secured to skin using silk suture and a dressing. It was attached to bag drainage. The patient tolerated the procedure well without immediate complication, and was transferred to recovery in stable condition. IMPRESSION: 1. Successful placement of bilateral 10.2 French percutaneous nephrostomy tubes using ultrasound and fluoroscopic guidance for treatment of obstructive uropathy secondary to bladder mass. 2. Nephrostomy tubes placed to bag drainage. The patient will return to Interventional Radiology in approximately 4-6 weeks for routine exchange pending further operative plans. Electronically Signed   By: Albin Felling M.D.   On: 03/30/2021 13:42     Labs:   Basic Metabolic Panel: Recent Labs  Lab 03/29/21 1852 03/31/21 0104 04/01/21 0044 04/02/21 0125 04/03/21 0134  NA 134* 138 141 141 139  K 4.8 4.5 4.0 4.0 4.0  CL 106 112* 112* 108 108  CO2 14* 16* 20* 20* 22  GLUCOSE 110* 90 96 97 107*  BUN 95* 84* 69* 55* 49*  CREATININE 6.06* 5.70* 4.63* 4.08* 3.73*  CALCIUM 8.8* 8.9 8.3* 8.4* 8.3*  MG  --   --   --   --  1.3*  PHOS  --  5.8* 4.7* 4.4 4.8*   GFR Estimated Creatinine Clearance: 14.6 mL/min (A) (by C-G formula based on SCr of 3.73 mg/dL (H)). Liver Function Tests: Recent Labs  Lab 03/31/21 0104 04/01/21 0044 04/02/21 0125 04/03/21 0134  ALBUMIN 3.1* 2.9* 2.7* 2.7*   No results for input(s): LIPASE,  AMYLASE in the last 168 hours. No results for input(s): AMMONIA in the last 168 hours. Coagulation profile Recent Labs  Lab 03/30/21 0925  INR 1.1    CBC: Recent Labs  Lab 03/29/21 1142 03/29/21 1852 03/31/21 0104 04/01/21 0044 04/02/21 0125 04/03/21 0134  WBC 9.2 10.4 10.3 11.3* 12.9* 13.7*  NEUTROABS 7.4 8.3*  --   --  10.0*  --   HGB 10.3* 9.7* 9.0* 8.7* 9.0* 9.0*  HCT 30.7* 30.3* 27.3* 26.9* 27.2* 27.3*  MCV 80.2 82.3 82.0 82.3 81.4 82.7  PLT 444.0* 408* 327 354 319 350   Cardiac Enzymes: No results for input(s): CKTOTAL, CKMB, CKMBINDEX, TROPONINI in the last 168 hours. BNP: Invalid input(s): POCBNP CBG: No results for input(s): GLUCAP in the last 168 hours. D-Dimer No results for input(s): DDIMER in the last 72 hours. Hgb A1c No results for input(s): HGBA1C in the last 72 hours. Lipid Profile No results for input(s): CHOL, HDL, LDLCALC, TRIG, CHOLHDL, LDLDIRECT in the last 72 hours. Thyroid function studies No results for input(s): TSH, T4TOTAL, T3FREE, THYROIDAB in the last 72 hours.  Invalid input(s): FREET3 Anemia work up No results for input(s): VITAMINB12, FOLATE, FERRITIN, TIBC, IRON, RETICCTPCT in the last 72 hours. Microbiology Recent Results (from the past 240 hour(s))  Urine Culture     Status: Abnormal   Collection Time: 03/30/21  8:26 AM   Specimen: Urine, Clean Catch  Result Value Ref Range Status   Specimen Description URINE, CLEAN CATCH  Final   Special Requests   Final  NONE Performed at [facility], [place] [address] [place], [place] [address]    Culture 30,000 COLONIES/mL PROTEUS MIRABILIS (A)  Final   Report Status 04/01/2021 FINAL  Final   Organism ID, Bacteria PROTEUS MIRABILIS (A)  Final      Susceptibility   Proteus mirabilis - MIC*    AMPICILLIN <=2 SENSITIVE Sensitive     CEFAZOLIN <=4 SENSITIVE Sensitive     CEFEPIME <=0.12 SENSITIVE Sensitive     CEFTRIAXONE <=0.25 SENSITIVE Sensitive     CIPROFLOXACIN <=0.25  SENSITIVE Sensitive     GENTAMICIN <=1 SENSITIVE Sensitive     IMIPENEM 1 SENSITIVE Sensitive     NITROFURANTOIN 128 RESISTANT Resistant     TRIMETH/SULFA <=20 SENSITIVE Sensitive     AMPICILLIN/SULBACTAM <=2 SENSITIVE Sensitive     PIP/TAZO <=4 SENSITIVE Sensitive     * 30,000 COLONIES/mL PROTEUS MIRABILIS  Resp Panel by RT-PCR (Flu A&B, Covid) Nasopharyngeal Swab     Status: None   Collection Time: 03/31/21  7:05 PM   Specimen: Nasopharyngeal Swab; Nasopharyngeal(NP) swabs in vial transport medium  Result Value Ref Range Status   SARS Coronavirus 2 by RT PCR NEGATIVE NEGATIVE Final    Comment: (NOTE) SARS-CoV-2 target nucleic acids are NOT DETECTED.  The SARS-CoV-2 RNA is generally detectable in upper respiratory specimens during the acute phase of infection. The lowest concentration of SARS-CoV-2 viral copies this assay can detect is 138 copies/mL. A negative result does not preclude SARS-Cov-2 infection and should not be used as the sole basis for treatment or other patient management decisions. A negative result may occur with  improper specimen collection/handling, submission of specimen other than nasopharyngeal swab, presence of viral mutation(s) within the areas targeted by this assay, and inadequate number of viral copies(<138 copies/mL). A negative result must be combined with clinical observations, patient history, and epidemiological information. The expected result is Negative.  Fact Sheet for Patients:  EntrepreneurPulse.com.au  Fact Sheet for Healthcare Providers:  IncredibleEmployment.be  This test is no t yet approved or cleared by the [place] FDA and  has been authorized for detection and/or diagnosis of SARS-CoV-2 by FDA under an Emergency Use Authorization (EUA). This EUA will remain  in effect (meaning this test can be used) for the duration of the COVID-19 declaration under Section 564(b)(1) of the Act,  21 U.S.C.section 360bbb-3(b)(1), unless the authorization is terminated  or revoked sooner.       Influenza A by PCR NEGATIVE NEGATIVE Final   Influenza B by PCR NEGATIVE NEGATIVE Final    Comment: (NOTE) The Xpert Xpress SARS-CoV-2/FLU/RSV plus assay is intended as an aid in the diagnosis of influenza from Nasopharyngeal swab specimens and should not be used as a sole basis for treatment. Nasal washings and aspirates are unacceptable for Xpert Xpress SARS-CoV-2/FLU/RSV testing.  Fact Sheet for Patients: EntrepreneurPulse.com.au  Fact Sheet for Healthcare Providers: IncredibleEmployment.be  This test is not yet approved or cleared by the [place] FDA and has been authorized for detection and/or diagnosis of SARS-CoV-2 by FDA under an Emergency Use Authorization (EUA). This EUA will remain in effect (meaning this test can be used) for the duration of the COVID-19 declaration under Section 564(b)(1) of the Act, 21 U.S.C. section 360bbb-3(b)(1), unless the authorization is terminated or revoked.  Performed at [facility], [place] [address], [place], [place] [address]      Discharge Instructions:   Discharge Instructions     Diet - low sodium heart healthy  Complete by: As directed    Discharge instructions   Complete by: As directed    Follow-up with your primary care provider in 1 week.  Check blood work at that time.  Follow-up with nephrology as has been scheduled on 04/20/2021.  Follow-up with urology as has been scheduled to discuss about the bladder tumor.  Apply ice at the hand 2-3 times a day..   Increase activity slowly   Complete by: As directed    No wound care   Complete by: As directed       Allergies as of 04/03/2021       Reactions   Penicillins Nausea And Vomiting        Medication List     TAKE these medications    acetaminophen 325 MG tablet Commonly known as: TYLENOL Take 2 tablets (650 mg  total) by mouth every 6 (six) hours as needed for up to 10 days for mild pain or moderate pain (or Fever >/= 101).   albuterol (2.5 MG/3ML) 0.083% nebulizer solution Commonly known as: PROVENTIL Take 2.5 mg by nebulization in the morning and at bedtime.   albuterol 108 (90 Base) MCG/ACT inhaler Commonly known as: VENTOLIN HFA Inhale 2 puffs into the lungs every 6 (six) hours as needed for wheezing or shortness of breath.   amLODipine 10 MG tablet Commonly known as: NORVASC Take 10 mg by mouth daily.   atorvastatin 40 MG tablet Commonly known as: LIPITOR Take 1 tablet (40 mg total) by mouth daily. Take 1 tab daily What changed: additional instructions   Breztri Aerosphere 160-9-4.8 MCG/ACT Aero Generic drug: Budeson-Glycopyrrol-Formoterol Inhale 2 puffs into the lungs in the morning and at bedtime.   citalopram 20 MG tablet Commonly known as: CELEXA Take 1 tablet (20 mg total) by mouth daily. What changed: when to take this   diclofenac Sodium 1 % Gel Commonly known as: VOLTAREN Apply 2 g topically 2 (two) times daily.   finasteride 5 MG tablet Commonly known as: PROSCAR Take 5 mg by mouth daily.   metoprolol succinate 25 MG 24 hr tablet Commonly known as: TOPROL-XL Take 25 mg by mouth daily.   oxyCODONE 5 MG immediate release tablet Commonly known as: Oxy IR/ROXICODONE Take 1 tablet (5 mg total) by mouth every 6 (six) hours as needed for moderate pain.   OXYGEN Inhale 2 L into the lungs See admin instructions. 2L when taking a nap and at bedtime   sodium bicarbonate 650 MG tablet Take 1 tablet (650 mg total) by mouth 2 (two) times daily.   tamsulosin 0.4 MG Caps capsule Commonly known as: FLOMAX Take 2 capsules by mouth at bedtime.   vitamin B-12 500 MCG tablet Commonly known as: CYANOCOBALAMIN Take 1,000 mcg by mouth daily.   VITAMIN D3 PO Take 1 tablet by mouth daily.        Follow-up Information     Claudia Desanctis, MD. Go on 04/20/2021.    Specialty: Nephrology Why: Appointment at 8:30 AM Contact information: [place] [address] [phone]         Dorothyann Peng, [place]. Schedule an appointment as soon as possible for a visit in 1 week(s).   Specialty: Family Medicine Contact information: [address] [place] [address] [phone]         Care, [facility] Follow up.   Specialty: Home Health Services Contact information: [place] [address] [phone]  Time coordinating discharge: 39 minutes  Signed:  Lafe Clerk  [facility] 04/03/2021, 11:45 AM

## 2021-04-03 NOTE — Progress Notes (Addendum)
Patient ID: Samuel [name], male   DOB: 11-May-1951, 70 y.o.   MRN: [id number]  [facility] Progress Note   Assessment/ Plan:   1. Acute kidney Injury on chronic kidney disease stage IIIb: Secondary to recurrent urothelial cancer with obstructive uropathy.  Now status post bilateral nephrostomy tubes and with continued improvement of renal function based on BUN/creatinine and without any acute hemodialysis needs.  Appears stable from a renal standpoint to discharge home with outpatient follow-up. 2.  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 3.  Anion gap metabolic acidosis: Secondary to acute kidney injury and likely distal tubular dysfunction from obstruction.  Continue sodium bicarbonate supplementation at this time until sufficient recovery seen. 4.  Anemia of chronic disease: Likely secondary to urinary losses, continue to hold ESA with recurrent malignancy. 5.  Right wrist/foot pain: X-ray this morning showed degenerative arthritis without any focal changes-unclear if needs treatment for gout  Follow-up scheduled for 04/20/2021 at 8:30 AM with Dr. Royce Macadamia [facility]).  We will sign off at this time and remain available for questions or concerns.  Subjective:   Continues to complain of right-sided wrist/hand pain overnight   Objective:   BP 122/63 (BP Location: Right Arm)   Pulse 85   Temp 98.4 F (36.9 C) (Oral)   Resp 18   Ht 5\' 3"  (1.6 m)   Wt 67.9 kg   SpO2 93%   BMI 26.51 kg/m   Intake/Output Summary (Last 24 hours) at 04/03/2021 8101 Last data filed at 04/03/2021 0844 Gross per 24 hour  Intake 1809.57 ml  Output 1600 ml  Net 209.57 ml   Weight change:   Physical Exam: Gen: Comfortably sitting up over the side of his bed, wife sitting at  his bedside CVS: Pulse regular rhythm, normal rate, S1 and S2 normal Resp: Clear to auscultation bilaterally, no rales/rhonchi Abd: Soft, obese, nontender Ext: No lower extremity edema.  Right wrist with mild erythema/swelling but no tenderness to touch.  Left ankle without significant swelling.  Imaging: DG Wrist 2 Views Right  Result Date: 04/03/2021 CLINICAL DATA:  Pain and swelling. EXAM: RIGHT WRIST - 2 VIEW COMPARISON:  No prior. FINDINGS: Diffuse degenerative change, most prominent about the radiocarpal and first carpometacarpal joints. Tiny carpal cysts noted and are most likely degenerative. No acute bony or joint abnormality identified. No evidence of fracture or dislocation. IMPRESSION: Diffuse degenerative change, most prominent about the radiocarpal and first carpometacarpal joints. Tiny carpal cysts noted are most likely degenerative. No acute bony abnormality identified. Electronically Signed   By: Marcello Moores  Register M.D.   On: 04/03/2021 06:26   IR NEPHROSTOMY PLACEMENT RIGHT  INDICATION: Bladder mass with bilateral obstructive uropathy and acute on chronic kidney injury   EXAM: 1. Ultrasound-guided puncture of a left renal calyx 2. Left antegrade nephrostogram through percutaneous access 3. Placement of a left percutaneous nephrostomy tube using fluoroscopic guidance 4. Ultrasound-guided puncture of a right renal calyx 5. Right antegrade nephrostogram to percutaneous access 6. Placement of a right percutaneous nephrostomy tube using fluoroscopic guidance   COMPARISON:  None.   MEDICATIONS: The patient received a single dose of Rocephin prior to arriving to the IR suite.   ANESTHESIA/SEDATION: Fentanyl 2 mcg IV; Versed 75 mg IV   Moderate Sedation Time:  38   The patient was continuously monitored during the procedure by the interventional radiology nurse  under my direct supervision.   CONTRAST:  20 mL-administered into the collecting system(s)   FLUOROSCOPY TIME:  Fluoroscopy Time: 3  minutes 36 seconds with 7 exposures   COMPLICATIONS: None immediate.   PROCEDURE: Informed written consent was obtained from the patient after a thorough discussion of the procedural risks, benefits and alternatives. All questions were addressed. Maximal Sterile Barrier Technique was utilized including caps, mask, sterile gowns, sterile gloves, sterile drape, hand hygiene and skin antiseptic. A timeout was performed prior to the initiation of the procedure.   The patient was placed prone on the exam table. The bilateral flanks were prepped and draped in the standard sterile fashion. Attention was first turned to the left side. Ultrasound was used to evaluate the left kidney, which demonstrated moderate to severe hydronephrosis. Skin entry site was marked, and local analgesia was obtained with 1% lidocaine. Using ultrasound guidance, an appropriate lower pole posterior calyx was punctured using a 21-gauge Chiba needle. Entry into the collecting system was confirmed with return of urine, and gentle injection of contrast material under fluoroscopy opacifying the proximal collecting system. An 018 wire was advanced through the needle into the renal pelvis and proximal ureter, followed by placement of a transition dilator. An antegrade nephrostogram was then performed, which demonstrated moderate to severe hydronephrosis. Over an 035 Amplatz wire, the percutaneous tract was serially dilated, and a 10.2 French nephrostomy tube was advanced with the loop formed in the renal pelvis. Appropriate positioning of the nephrostomy tube was confirmed with injection of contrast material. The nephrostomy tube was secured to skin using silk suture and a dressing. It was attached to bag drainage.   Attention was then turned to the contralateral right side. The patient was placed prone on the exam table. The right flank was prepped and draped in the standard sterile fashion. Ultrasound was used to evaluate the side kidney, which  demonstrated moderate to severe hydronephrosis. Skin entry site was marked, and local analgesia was obtained with 1% lidocaine. Using ultrasound guidance, an appropriate lower pole posterior calyx was punctured using a 21-gauge Chiba needle. Entry into the collecting system was confirmed with return of urine, and gentle injection of contrast material under fluoroscopy opacifying the proximal collecting system. An 018 wire was advanced through the needle into the renal pelvis and proximal ureter, followed by placement of a transition dilator. An antegrade nephrostogram was then performed, which demonstrated moderate to severe hydronephrosis. Over an 035 Amplatz wire, the percutaneous tract was serially dilated, and a 10.2 French nephrostomy tube was advanced with the loop formed in the renal pelvis. Appropriate positioning of the nephrostomy tube was confirmed with injection of contrast material. The nephrostomy tube was secured to skin using silk suture and a dressing. It was attached to bag drainage.   The patient tolerated the procedure well without immediate complication, and was transferred to recovery in stable condition.   IMPRESSION: 1. Successful placement of bilateral 10.2 French percutaneous nephrostomy tubes using ultrasound and fluoroscopic guidance for treatment of obstructive uropathy secondary to bladder mass. 2. Nephrostomy tubes placed to bag drainage. The patient will return to Interventional Radiology in approximately 4-6 weeks for routine exchange pending further operative plans.     Electronically Signed   By: Albin Felling M.D.   On: 03/30/2021 13:42     Labs: BMET Recent Labs  Lab 03/29/21 1142 03/29/21 1852 03/31/21 0104 04/01/21 0044 04/02/21 0125 04/03/21 0134  NA 137 134* 138 141 141 139  K 5.1 4.8 4.5 4.0  4.0 4.0  CL 105 106 112* 112* 108 108  CO2 19 14* 16* 20* 20* 22  GLUCOSE 97 110* 90 96 97 107*  BUN 90* 95* 84* 69* 55* 49*  CREATININE 5.90* 6.06* 5.70* 4.63* 4.08*  3.73*  CALCIUM 9.5 8.8* 8.9 8.3* 8.4* 8.3*  PHOS  --   --  5.8* 4.7* 4.4 4.8*   CBC Recent Labs  Lab 03/29/21 1142 03/29/21 1852 03/31/21 0104 04/01/21 0044 04/02/21 0125 04/03/21 0134  WBC 9.2 10.4 10.3 11.3* 12.9* 13.7*  NEUTROABS 7.4 8.3*  --   --  10.0*  --   HGB 10.3* 9.7* 9.0* 8.7* 9.0* 9.0*  HCT 30.7* 30.3* 27.3* 26.9* 27.2* 27.3*  MCV 80.2 82.3 82.0 82.3 81.4 82.7  PLT 444.0* 408* 327 354 319 350    Medications:     amLODipine  10 mg Oral Daily   atorvastatin  40 mg Oral Daily   citalopram  20 mg Oral Daily   diclofenac Sodium  2 g Topical BID   docusate sodium  100 mg Oral BID   finasteride  5 mg Oral Daily   umeclidinium bromide  1 puff Inhalation Daily   And   fluticasone furoate-vilanterol  1 puff Inhalation Daily   methylPREDNISolone (SOLU-MEDROL) injection  40 mg Intravenous Once   metoprolol succinate  25 mg Oral Daily   sodium bicarbonate  650 mg Oral BID   tamsulosin  0.8 mg Oral QHS   Elmarie Shiley, MD 04/03/2021, 9:22 AM

## 2021-04-03 NOTE — Progress Notes (Signed)
Physical Therapy Treatment Patient Details Name: Samuel [name] MRN: [id number] DOB: 1950-12-04 Today's Date: 04/03/2021   History of Present Illness Samuel [name] is a 70 y.o. male presenting with hematuria/renal failure on outpatient labs. Pt found to have large posterior bladder obstructing both distal ureters with bilateral hydronephrosis s/p bilateral nephrostomy tube placement on 11.18.22. PHMx:  congential missing left hand; bladder cancer; COPD;  depression/anxiety; stage 3 CKD; HTN; and HLD    PT Comments    Pt admitted with above diagnosis. Pt was able to ambulate with bil PFRW today incr distance and steady gait.  Pt DOE 3/4 at end of walk and desaturation to 86% on RA.  Pt did not feel like getting back up and walking with O2 therefore will check with O2 at later date. Pt and wife aware of possible equipment needs and wife can assist pt at home. Will continue to follow acutely. Pt currently with functional limitations due to the deficits listed below (see PT Problem List). Pt will benefit from skilled PT to increase their independence and safety with mobility to allow discharge to the venue listed below.      Recommendations for follow up therapy are one component of a multi-disciplinary discharge planning process, led by the attending physician.  Recommendations may be updated based on patient status, additional functional criteria and insurance authorization.  Follow Up Recommendations  Home health PT     Assistance Recommended at Discharge Intermittent Supervision/Assistance  Equipment Recommendations   (pulse oximeter, ? bil platform RW)    Recommendations for Other Services       Precautions / Restrictions Precautions Precaution Comments: Bil nephrostomy tubes Restrictions Weight Bearing Restrictions: No     Mobility  Bed Mobility               General bed mobility comments: Up in recliner upon my arrival    Transfers Overall  transfer level: Needs assistance Equipment used: Bilateral platform walker Transfers: Sit to/from Stand Sit to Stand: Min guard           General transfer comment: cues for hand placement    Ambulation/Gait Ambulation/Gait assistance: Min guard Gait Distance (Feet): 150 Feet Assistive device: Bilateral platform walker Gait Pattern/deviations: Step-through pattern;Decreased stride length;Drifts right/left;Trunk flexed   Gait velocity interpretation: <1.31 ft/sec, indicative of household ambulator   General Gait Details: Pt was able to progress distance with DOE 3/4.  Sats 86% on RA upon return to room and quickly back to 88% once pt sat down. Pt may need home O2 with activity.   Stairs             Wheelchair Mobility    Modified Rankin (Stroke Patients Only)       Balance Overall balance assessment: Needs assistance Sitting-balance support: No upper extremity supported;Feet supported Sitting balance-Leahy Scale: Good     Standing balance support: Bilateral upper extremity supported;During functional activity Standing balance-Leahy Scale: Poor Standing balance comment: relies on RW for support                            Cognition Arousal/Alertness: Awake/alert Behavior During Therapy: WFL for tasks assessed/performed Overall Cognitive Status: Within Functional Limits for tasks assessed                                          Exercises  General Comments General comments (skin integrity, edema, etc.): right wrist swollen      Pertinent Vitals/Pain Pain Assessment: Faces Faces Pain Scale: Hurts even more Pain Location: RLE (top of forefoot, more lateral than medial, RUE wrist Pain Descriptors / Indicators: Aching;Sore Pain Intervention(s): Limited activity within patient's tolerance;Monitored during session;Repositioned;Other (comment) (nurse just rubbed gel on foot and wrist)    Home Living Family/patient expects to  be discharged to:: Private residence Living Arrangements: Spouse/significant other Available Help at Discharge: Family;Available 24 hours/day Type of Home: House Home Access: Level entry       Home Layout: One level Home Equipment: Shower seat (home O2 at night)      Prior Function            PT Goals (current goals can now be found in the care plan section) Acute Rehab PT Goals Patient Stated Goal: to goh ome PT Goal Formulation: With patient Time For Goal Achievement: 04/18/21 Potential to Achieve Goals: Good    Frequency    Min 3X/week      PT Plan      Co-evaluation              AM-PAC PT "6 Clicks" Mobility   Outcome Measure  Help needed turning from your back to your side while in a flat bed without using bedrails?: None Help needed moving from lying on your back to sitting on the side of a flat bed without using bedrails?: A Little Help needed moving to and from a bed to a chair (including a wheelchair)?: A Little Help needed standing up from a chair using your arms (e.g., wheelchair or bedside chair)?: A Little Help needed to walk in hospital room?: A Little Help needed climbing 3-5 steps with a railing? : A Little 6 Click Score: 19    End of Session Equipment Utilized During Treatment: Gait belt;Oxygen Activity Tolerance: Patient limited by fatigue Patient left: in chair;with call bell/phone within reach;with family/visitor present Nurse Communication: Mobility status PT Visit Diagnosis: Muscle weakness (generalized) (M62.81);Pain Pain - Right/Left: Right Pain - part of body: Ankle and joints of foot;Hand     Time: 1010-1042 PT Time Calculation (min) (ACUTE ONLY): 32 min  Charges:  $Gait Training: 8-22 mins                     Johsua Shevlin M,PT Acute [place] [phone] (pager)    Alvira Philips 04/03/2021, 11:02 AM

## 2021-04-04 ENCOUNTER — Telehealth: Payer: Self-pay

## 2021-04-04 NOTE — Telephone Encounter (Signed)
Per spouse  Transition Care Management Follow-up Telephone Call Date of discharge and from where: 04/03/2021 [facility] How have you been since you were released from the hospital? Doing good. Hand getting better Any questions or concerns? No  Items Reviewed: Did the pt receive and understand the discharge instructions provided? Yes  Medications obtained and verified? Yes  Other? No  Any new allergies since your discharge? No  Dietary orders reviewed? Yes Do you have support at home? Yes   Home Care and Equipment/Supplies: Were home health services ordered? yes If so, what is the name of the agency? [facility]  Has the agency set up a time to come to the patient's home? no Were any new equipment or medical supplies ordered?  No What is the name of the medical supply agency? N/a Were you able to get the supplies/equipment? not applicable Do you have any questions related to the use of the equipment or supplies? No  Functional Questionnaire: (I = Independent and D = Dependent) ADLs: I  Bathing/Dressing- I  Meal Prep- I  Eating- I  Maintaining continence- I  Transferring/Ambulation- I  Managing Meds- I  Follow up appointments reviewed:  PCP Hospital f/u appt confirmed? Yes  Scheduled to see Dorothyann Peng NP on 04/10/2021 @ 4:30. [facility] f/u appt confirmed? Yes  Scheduled to see Nephrology on 04/20/2021 @ 8:30. Are transportation arrangements needed? No  If their condition worsens, is the pt aware to call PCP or go to the Emergency Dept.? Yes Was the patient provided with contact information for the PCP's office or ED? Yes Was to pt encouraged to call back with questions or concerns? Yes

## 2021-04-10 ENCOUNTER — Encounter: Payer: Self-pay | Admitting: Adult Health

## 2021-04-10 ENCOUNTER — Ambulatory Visit (INDEPENDENT_AMBULATORY_CARE_PROVIDER_SITE_OTHER): Payer: Medicare Other | Admitting: Adult Health

## 2021-04-10 VITALS — BP 122/82 | Ht 63.0 in | Wt 145.0 lb

## 2021-04-10 DIAGNOSIS — C679 Malignant neoplasm of bladder, unspecified: Secondary | ICD-10-CM | POA: Diagnosis not present

## 2021-04-10 DIAGNOSIS — E782 Mixed hyperlipidemia: Secondary | ICD-10-CM | POA: Diagnosis not present

## 2021-04-10 DIAGNOSIS — F32A Depression, unspecified: Secondary | ICD-10-CM

## 2021-04-10 DIAGNOSIS — J449 Chronic obstructive pulmonary disease, unspecified: Secondary | ICD-10-CM

## 2021-04-10 DIAGNOSIS — N289 Disorder of kidney and ureter, unspecified: Secondary | ICD-10-CM | POA: Diagnosis not present

## 2021-04-10 DIAGNOSIS — I10 Essential (primary) hypertension: Secondary | ICD-10-CM | POA: Diagnosis not present

## 2021-04-10 DIAGNOSIS — N179 Acute kidney failure, unspecified: Secondary | ICD-10-CM

## 2021-04-10 DIAGNOSIS — N139 Obstructive and reflux uropathy, unspecified: Secondary | ICD-10-CM | POA: Diagnosis not present

## 2021-04-10 DIAGNOSIS — N1832 Chronic kidney disease, stage 3b: Secondary | ICD-10-CM

## 2021-04-10 MED ORDER — ATORVASTATIN CALCIUM 40 MG PO TABS: 40.0000 mg | ORAL_TABLET | Freq: Every day | ORAL | 3 refills | Status: DC

## 2021-04-10 NOTE — Progress Notes (Signed)
Subjective:    Patient ID: Samuel [name], male    DOB: Jul 23, 1950, 70 y.o.   MRN: [id number]  HPI 70 year old male who  has a past medical history of Anxiety, Bladder cancer ([place]), BPH with obstruction/lower urinary tract symptoms, CKD (chronic kidney disease), stage III ([place]), Congenital deformity of left hand, COPD (chronic obstructive pulmonary disease) ([place]), Depression, Essential hypertension, History of adenomatous polyp of colon, Hyperlipidemia, Renal cysts, acquired, bilateral, and Wears glasses.  He presents with his wife for TCM visit. His wife is present with him today   Admit Date 03/29/2021 Discharge Date 04/03/2021  He presented to the hospital with hematuria and acute renal failure is from the labs obtained by his pulmonary physician.  He also had generalized fatigue, shortness of breath.  He was also complaining of urinary frequency, intermittent hematuria Durning for UTI with acute renal failure.  Initial creatinine was 6.0, BUN of 95.  CT scan of the abdomen and pelvis showed a mass in his bladder with bilateral hydronephrosis.  He does have a history of bladder cancer status post TURBT  Urology and nephrology was consulted the patient was admitted to the hospital for further treatment  Hospital Course  - Acute Renal Failure on chronic kidney disease stage IIIb(baseline creatinine 2.9-5.2) with metabolic acidosis Secondary to obstructive uropathy.  Status post bilateral nephrotomy tube placement on 03/30/2021.  Creatinine continued to trend down.  Nephrology has followed the patient at this time.  Patient continued on sodium bicarbonate tablets until followed up with nephrology as outpatient.  Mild tenderness and erythema of the right hand X-ray of the right hand showed some osteoarthritis.  No evidence of cellulitis.  He received 1 dose of IV Solu-Medrol.  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  Urology will follow up once the patient renal function stabilized and improved.  Appointment had been scheduled as an outpatient.  Was continued on Flomax on discharge  COPD/chronic shortness of breath -Continue current bronchodilators on discharge  Essential hypertension Continue Norvasc and Toprol  Hyperlipidemia Continued on statins  Depression/anxiety Continue on Celexa  UTI -Urine culture showed 3000 colonies of Proteus.  He received IV Rocephin completed 3-day course  Today he reports that he is feeling much better.  Nephrostomy tubes are draining clear yellow urine, continues to have blood clots but this is not frequent.  Has not had any fevers or chills.  His appetite is starting to resolve.  Does have a follow-up appointment scheduled for early December with nephrology and urology.  Review of Systems  Constitutional: Negative.   Respiratory: Negative.    Cardiovascular: Negative.   Gastrointestinal: Negative.   Genitourinary: Negative.   Musculoskeletal: Negative.   Neurological: Negative.   Psychiatric/Behavioral: Negative.    All other systems reviewed and are negative. Past Medical History:  Diagnosis Date   Anxiety    Bladder cancer [facility])    urologist--- dr Tresa Moore   BPH with obstruction/lower urinary tract symptoms    CKD (chronic kidney disease), stage III ([place])    Congenital deformity of left hand    COPD (chronic obstructive pulmonary disease) ([place])    followed by pcp   Depression    Essential hypertension    followed by pcp   History of adenomatous polyp of colon    Hyperlipidemia    Renal cysts, acquired, bilateral    Wears glasses     Social History   Socioeconomic History   Marital status:  Married    Spouse name: Not on file   Number of children: Not on file   Years of education: Not on file   Highest education level: Not on file  Occupational History   Occupation: retired  Tobacco Use   Smoking status: Former     Years: 58.00    Types: Cigarettes    Quit date: 01/16/2017    Years since quitting: 4.2   Smokeless tobacco: Never  Vaping Use   Vaping Use: Never used  Substance and Sexual Activity   Alcohol use: No   Drug use: Never   Sexual activity: Not on file  Other Topics Concern   Not on file  Social History Narrative   Retired    Married       He watches television   Social Determinants of Health   Financial Resource Strain: Medium Risk   Difficulty of Paying Living Expenses: Somewhat hard  Food Insecurity: No Food Insecurity   Worried About Charity fundraiser in the Last Year: Never true   Arboriculturist in the Last Year: Never true  Transportation Needs: No Transportation Needs   Lack of Transportation (Medical): No   Lack of Transportation (Non-Medical): No  Physical Activity: Inactive   Days of Exercise per Week: 0 days   Minutes of Exercise per Session: 0 min  Stress: No Stress Concern Present   Feeling of Stress : Not at all  Social Connections: Moderately Isolated   Frequency of Communication with Friends and Family: More than three times a week   Frequency of Social Gatherings with Friends and Family: More than three times a week   Attends Religious Services: Never   Marine scientist or Organizations: No   Attends Archivist Meetings: Never   Marital Status: Married  Human resources officer Violence: Not At Risk   Fear of Current or Ex-Partner: No   Emotionally Abused: No   Physically Abused: No   Sexually Abused: No    Past Surgical History:  Procedure Laterality Date   COLONOSCOPY WITH PROPOFOL  last one 02-04-2017  dr Henrene Pastor   CYSTOSCOPY W/ RETROGRADES Bilateral 09/15/2020   Procedure: CYSTOSCOPY WITH RETROGRADE PYELOGRAM;  Surgeon: Alexis Frock, MD;  Location: [facility];  Service: Urology;  Laterality: Bilateral;   IR NEPHROSTOMY PLACEMENT LEFT  03/30/2021   IR NEPHROSTOMY PLACEMENT RIGHT  04/02/2021   NO PAST SURGERIES      TRANSURETHRAL RESECTION OF BLADDER TUMOR N/A 09/15/2020   Procedure: BLADDER BIOPSY FULGERATION;  Surgeon: Alexis Frock, MD;  Location: [facility];  Service: Urology;  Laterality: N/A;  37 MINS    Family History  Problem Relation Age of Onset   Hypertension Mother    Stroke Mother    Heart disease Father    Breast cancer Sister    Prostate cancer Brother    Hypertension Sister    Colon cancer Neg Hx    Esophageal cancer Neg Hx    Pancreatic cancer Neg Hx    Rectal cancer Neg Hx    Stomach cancer Neg Hx     Allergies  Allergen Reactions   Penicillins Nausea And Vomiting    Current Outpatient Medications on File Prior to Visit  Medication Sig Dispense Refill   acetaminophen (TYLENOL) 325 MG tablet Take 2 tablets (650 mg total) by mouth every 6 (six) hours as needed for up to 10 days for mild pain or moderate pain (or Fever >/= 101). 40 tablet  0   albuterol (PROVENTIL) (2.5 MG/3ML) 0.083% nebulizer solution Take 2.5 mg by nebulization in the morning and at bedtime.     albuterol (VENTOLIN HFA) 108 (90 Base) MCG/ACT inhaler Inhale 2 puffs into the lungs every 6 (six) hours as needed for wheezing or shortness of breath. 8 g 6   amLODipine (NORVASC) 10 MG tablet Take 10 mg by mouth daily.     Budeson-Glycopyrrol-Formoterol (BREZTRI AEROSPHERE) 160-9-4.8 MCG/ACT AERO Inhale 2 puffs into the lungs in the morning and at bedtime. 11.8 g 11   Cholecalciferol (VITAMIN D3 PO) Take 1 tablet by mouth daily.     citalopram (CELEXA) 20 MG tablet Take 1 tablet (20 mg total) by mouth daily. (Patient taking differently: Take 20 mg by mouth at bedtime.) 90 tablet 1   diclofenac Sodium (VOLTAREN) 1 % GEL Apply 2 g topically 2 (two) times daily. 150 g 0   finasteride (PROSCAR) 5 MG tablet Take 5 mg by mouth daily.     metoprolol succinate (TOPROL-XL) 25 MG 24 hr tablet Take 25 mg by mouth daily.     oxyCODONE (OXY IR/ROXICODONE) 5 MG immediate release tablet Take 1 tablet (5 mg total)  by mouth every 6 (six) hours as needed for moderate pain. 10 tablet 0   OXYGEN Inhale 2 L into the lungs See admin instructions. 2L when taking a nap and at bedtime     sodium bicarbonate 650 MG tablet Take 1 tablet (650 mg total) by mouth 2 (two) times daily. 60 tablet 0   tamsulosin (FLOMAX) 0.4 MG CAPS capsule Take 2 capsules by mouth at bedtime.     vitamin B-12 (CYANOCOBALAMIN) 500 MCG tablet Take 1,000 mcg by mouth daily.     No current facility-administered medications on file prior to visit.    BP 122/82   Ht 5\' 3"  (1.6 m)   Wt 145 lb (65.8 kg)   BMI 25.69 kg/m       Objective:   Physical Exam Vitals and nursing note reviewed.  Constitutional:      Appearance: Normal appearance.  Cardiovascular:     Rate and Rhythm: Normal rate and regular rhythm.     Pulses: Normal pulses.     Heart sounds: Normal heart sounds.  Pulmonary:     Effort: Pulmonary effort is normal.     Breath sounds: Normal breath sounds.  Abdominal:     Comments: Bilateral nephrology tubes draining clear yellow urine.  Skin:    General: Skin is warm and dry.  Neurological:     General: No focal deficit present.     Mental Status: He is alert and oriented to person, place, and time.  Psychiatric:        Mood and Affect: Mood normal.        Behavior: Behavior normal.        Thought Content: Thought content normal.       Assessment & Plan:  1. Function kidney decreased -Reviewed hospital notes, discharge instructions, labs, and imaging with the patient and his wife.  All questions answered to the best of my ability - CBC with Differential/Platelet; Future - Comprehensive metabolic panel; Future - Magnesium; Future - Magnesium - Comprehensive metabolic panel - CBC with Differential/Platelet  2. Mixed hyperlipidemia  - atorvastatin (LIPITOR) 40 MG tablet; Take 1 tablet (40 mg total) by mouth daily.  Dispense: 90 tablet; Refill: 3  3. Chronic obstructive pulmonary disease, unspecified COPD  type ([place]) - Continue with inhaler. Follow up with Pulmonary  as directed  4. Essential hypertension - controlled. No change in medications   5. Depression, unspecified depression type - Continue with Celexa 20 mg   6. Acute renal failure superimposed on stage 3b chronic kidney disease, unspecified acute renal failure type ([place]) - Follow up with Urology and Nephrology as directed - CBC with Differential/Platelet; Future - Comprehensive metabolic panel; Future - Magnesium; Future - Magnesium - Comprehensive metabolic panel - CBC with Differential/Platelet  7. Malignant neoplasm of urinary bladder, unspecified site [facility]) - Follow up with Urology as directed - CBC with Differential/Platelet; Future - Comprehensive metabolic panel; Future - Magnesium; Future - Magnesium - Comprehensive metabolic panel - CBC with Differential/Platelet  8. Lower obstructive uropathy - Follow up with Urology and Nephrology as directed - CBC with Differential/Platelet; Future - Comprehensive metabolic panel; Future - Magnesium; Future - Magnesium - Comprehensive metabolic panel - CBC with Differential/Platelet  Dorothyann Peng, NP

## 2021-04-11 ENCOUNTER — Encounter: Payer: Self-pay | Admitting: Adult Health

## 2021-04-11 DIAGNOSIS — J449 Chronic obstructive pulmonary disease, unspecified: Secondary | ICD-10-CM

## 2021-04-11 DIAGNOSIS — I10 Essential (primary) hypertension: Secondary | ICD-10-CM

## 2021-04-11 LAB — CBC WITH DIFFERENTIAL/PLATELET
Basophils Absolute: 0.1 10*3/uL (ref 0.0–0.1)
Basophils Relative: 1.3 % (ref 0.0–3.0)
Eosinophils Absolute: 0.4 10*3/uL (ref 0.0–0.7)
Eosinophils Relative: 3.9 % (ref 0.0–5.0)
HCT: 29.2 % — ABNORMAL LOW (ref 39.0–52.0)
Hemoglobin: 9.6 g/dL — ABNORMAL LOW (ref 13.0–17.0)
Lymphocytes Relative: 8.7 % — ABNORMAL LOW (ref 12.0–46.0)
Lymphs Abs: 1 10*3/uL (ref 0.7–4.0)
MCHC: 33 g/dL (ref 30.0–36.0)
MCV: 82.2 fl (ref 78.0–100.0)
Monocytes Absolute: 1.4 10*3/uL — ABNORMAL HIGH (ref 0.1–1.0)
Monocytes Relative: 12.8 % — ABNORMAL HIGH (ref 3.0–12.0)
Neutro Abs: 8.1 10*3/uL — ABNORMAL HIGH (ref 1.4–7.7)
Neutrophils Relative %: 73.3 % (ref 43.0–77.0)
Platelets: 603 10*3/uL — ABNORMAL HIGH (ref 150.0–400.0)
RBC: 3.55 Mil/uL — ABNORMAL LOW (ref 4.22–5.81)
RDW: 18 % — ABNORMAL HIGH (ref 11.5–15.5)
WBC: 11.1 10*3/uL — ABNORMAL HIGH (ref 4.0–10.5)

## 2021-04-11 LAB — COMPREHENSIVE METABOLIC PANEL
ALT: 14 U/L (ref 0–53)
AST: 16 U/L (ref 0–37)
Albumin: 3.7 g/dL (ref 3.5–5.2)
Alkaline Phosphatase: 74 U/L (ref 39–117)
BUN: 43 mg/dL — ABNORMAL HIGH (ref 6–23)
CO2: 25 mEq/L (ref 19–32)
Calcium: 9.3 mg/dL (ref 8.4–10.5)
Chloride: 102 mEq/L (ref 96–112)
Creatinine, Ser: 2.67 mg/dL — ABNORMAL HIGH (ref 0.40–1.50)
GFR: 23.34 mL/min — ABNORMAL LOW (ref >60.00)
Glucose, Bld: 97 mg/dL (ref 70–99)
Potassium: 4 mEq/L (ref 3.5–5.1)
Sodium: 139 mEq/L (ref 135–145)
Total Bilirubin: 0.3 mg/dL (ref 0.2–1.2)
Total Protein: 7 g/dL (ref 6.0–8.3)

## 2021-04-11 LAB — MAGNESIUM: Magnesium: 1.8 mg/dL (ref 1.5–2.5)

## 2021-04-16 NOTE — Progress Notes (Signed)
Cardiology Office Note:    Date:  04/17/2021   ID:  Samuel [name], Samuel [name] 1950/07/13, MRN [id number]  PCP:  Dorothyann Peng, NP   [facility] Providers Cardiologist:  Lenna Sciara, MD Referring MD: Clayton Bibles, NP   Chief Complaint/Reason for Referral:  Dyspnea   ASSESSMENT & PLAN:    Dyspnea, unspecified type Given his wheezing and evidence of centrilobular emphysema I believe his dyspnea is likely from COPD. Will obtain an echocardiogram to evaluate further.  The patient does have coronary calcium which may represent obstructive coronary artery disease.  Typically does not present as dyspnea unless  these are proximal obstructive lesions leading to ischemically mediated diastolic dysfunction.  We could consider right heart catheterization and coronary angiography study.  Additionally coronary CTA could be considered however the patient's creatinine is above his baseline of around 2.  He does not endorse any exertional angina.  I believe his coronary artery disease should be treated medically with aspirin and atorvastatin.  We will start aspirin 81 mg daily.  I will keep follow-up with me open-ended depending on the results of his echocardiogram.  Given his poor functional capacity and chronic kidney disease conservative management is probably best.  Coronary artery disease involving native coronary artery of native heart, unspecified whether angina present Given aortic and coronary calcium we will start aspirin 81 mg daily  Hypertension, unspecified type BP is well controlled today.  Mixed hyperlipidemia Continue statin, his goal LDL is < 70, this is followed by his PCP.  Centrilobular emphysema ([place]) I stressed that he needs to get his PFTs performed which have been ordered twice but not yet completed       Dispo:  No follow-ups on file.      Medication Adjustments/Labs and Tests Ordered: Current medicines are reviewed at length with the patient today.   Concerns regarding medicines are outlined above.    Tests Ordered: No orders of the defined types were placed in this encounter.    Medication Changes: No orders of the defined types were placed in this encounter.    History of Present Illness:    FOCUSED CARDIOVASCULAR PROBLEM LIST:   1.  COPD with 50-pack-year smoking history with centrilobular emphysema seen on CT scan November 2022; on nocturnal supplemental oxygen 2.  CKD stage III, Cr ~ 2 3.  Hypertension 4.  Hyperlipidemia; goal LDL less than 70 based on coronary and aortic calcium 5.  Aortic and coronary calcium seen on CT scan November 2022   The patient is a 70 y.o. male with the indicated medical history here for recommendations regarding dyspnea.  The patient was recently admitted to the hospital with hematuria and acute renal failure.  He was found to have bilateral hydronephrosis from obstructive uropathy treated with bilateral nephrostomy tubes.  He was seen by his PCP in follow-up.  He was relatively stable.  PFTs were ordered but have not been done.  The patient tells me that he is short of breath almost every day.  He tells me that showering makes him short of breath.  He does not do much in a normal day.  With a little that he does he does not endorse any chest pain.  He does wheeze when he gets short of breath.  He denies any paroxysmal nocturnal dyspnea, severe peripheral edema, signs or symptoms of stroke, or severe bleeding.  He has not been vaccinated for COVID or influenza.  He has not required any other hospitalizations or emergency room  visits since discharge after treatment for obstructive uropathy.       Previous Medical History: Past Medical History:  Diagnosis Date   Anxiety    Bladder cancer [facility])    urologist--- dr Tresa Moore   BPH with obstruction/lower urinary tract symptoms    CKD (chronic kidney disease), stage III ([place])    Congenital deformity of left hand    COPD (chronic obstructive pulmonary  disease) ([place])    followed by pcp   Depression    Essential hypertension    followed by pcp   History of adenomatous polyp of colon    Hyperlipidemia    Renal cysts, acquired, bilateral    Wears glasses      Current Medications: No outpatient medications have been marked as taking for the 04/17/21 encounter (Appointment) with Early Osmond, MD.     Allergies:    Penicillins   Social History:   Social History   Tobacco Use   Smoking status: Former    Years: 58.00    Types: Cigarettes    Quit date: 01/16/2017    Years since quitting: 4.2   Smokeless tobacco: Never  Vaping Use   Vaping Use: Never used  Substance Use Topics   Alcohol use: No   Drug use: Never     Family Hx: Family History  Problem Relation Age of Onset   Hypertension Mother    Stroke Mother    Heart disease Father    Breast cancer Sister    Prostate cancer Brother    Hypertension Sister    Colon cancer Neg Hx    Esophageal cancer Neg Hx    Pancreatic cancer Neg Hx    Rectal cancer Neg Hx    Stomach cancer Neg Hx      Review of Systems:   Please see the history of present illness.    All other systems reviewed and are negative.  EKGs/Labs/Other Test Reviewed:    EKG:  EKG today demonstrates NSR; prior EKGs demonstrate NSR (5/22)  Prior CV studies: None  Imaging studies that I have independently reviewed today: Chest CT  Recent Labs: 03/29/2021: Pro B Natriuretic peptide (BNP) 61.0 04/10/2021: ALT 14; BUN 43; Creatinine, Ser 2.67; Hemoglobin 9.6; Magnesium 1.8; Platelets 603.0; Potassium 4.0; Sodium 139   Recent Lipid Panel Lab Results  Component Value Date/Time   CHOL 208 (H) 04/04/2020 07:48 AM   TRIG 99 04/04/2020 07:48 AM   HDL 58 04/04/2020 07:48 AM   LDLCALC 129 (H) 04/04/2020 07:48 AM     Risk Assessment/Calculations:           Physical Exam:    VS:  There were no vitals taken for this visit.   Wt Readings from Last 3 Encounters:  04/10/21 145 lb (65.8 kg)   03/30/21 149 lb 10.7 oz (67.9 kg)  03/29/21 149 lb 9.6 oz (67.9 kg)    GENERAL:  No apparent distress, AOx3 HEENT:  No carotid bruits, +2 carotid impulses, no scleral icterus CAR: RRR, no murmurs, gallops, rubs, or thrills RES:  Poor air movement ABD:  Soft, nontender, nondistended, positive bowel sounds x 4 VASC:  +2 radial pulses, +2 carotid pulses, palpable pedal pulses NEURO:  CN 2-12 grossly intact; motor and sensory grossly intact PSYCH:  No active depression or anxiety EXT:  No edema, ecchymosis, or cyanosis    Signed, Early Osmond, MD  04/17/2021 9:08 AM    [address], [place], [place]  [address]  Phone: [phone]; Fax: [phone]

## 2021-04-17 ENCOUNTER — Other Ambulatory Visit: Payer: Self-pay

## 2021-04-17 ENCOUNTER — Encounter: Payer: Self-pay | Admitting: Internal Medicine

## 2021-04-17 ENCOUNTER — Ambulatory Visit: Payer: Medicare Other | Admitting: Internal Medicine

## 2021-04-17 VITALS — BP 114/70 | HR 71 | Ht 63.0 in | Wt 148.8 lb

## 2021-04-17 DIAGNOSIS — I10 Essential (primary) hypertension: Secondary | ICD-10-CM | POA: Diagnosis not present

## 2021-04-17 DIAGNOSIS — I2510 Atherosclerotic heart disease of native coronary artery without angina pectoris: Secondary | ICD-10-CM

## 2021-04-17 DIAGNOSIS — J432 Centrilobular emphysema: Secondary | ICD-10-CM

## 2021-04-17 DIAGNOSIS — R0600 Dyspnea, unspecified: Secondary | ICD-10-CM

## 2021-04-17 DIAGNOSIS — E782 Mixed hyperlipidemia: Secondary | ICD-10-CM

## 2021-04-17 MED ORDER — ASPIRIN EC 81 MG PO TBEC: 81.0000 mg | DELAYED_RELEASE_TABLET | Freq: Every day | ORAL | 3 refills | Status: AC

## 2021-04-17 NOTE — Patient Instructions (Signed)
Call [facility] at [phone] to arrange your pulmonary function test.   Medication Instructions:  Your physician has recommended you make the following change in your medication:  1.) start aspirin 81 mg (enteric coated) - take one tablet daily  Lab Work: none  Testing/Procedures: Your physician has requested that you have an echocardiogram. Echocardiography is a painless test that uses sound waves to create images of your heart. It provides your doctor with information about the size and shape of your heart and how well your heart's chambers and valves are working. This procedure takes approximately one hour. There are no restrictions for this procedure.   Follow-Up: As needed with Dr. Ali Lowe

## 2021-04-19 ENCOUNTER — Encounter: Payer: Self-pay | Admitting: Internal Medicine

## 2021-04-19 ENCOUNTER — Encounter: Payer: Self-pay | Admitting: Adult Health

## 2021-04-19 ENCOUNTER — Other Ambulatory Visit: Payer: Self-pay | Admitting: Urology

## 2021-04-19 NOTE — Telephone Encounter (Signed)
Please advise 

## 2021-04-24 NOTE — Progress Notes (Signed)
Pt. Need instructions for upcoming procedure.

## 2021-04-24 NOTE — Patient Instructions (Signed)
DUE TO COVID-19 ONLY ONE VISITOR IS ALLOWED TO COME WITH YOU AND STAY IN THE WAITING ROOM ONLY DURING PRE OP AND PROCEDURE.   **NO VISITORS ARE ALLOWED IN THE SHORT STAY AREA OR RECOVERY ROOM!!**  IF YOU WILL BE ADMITTED INTO THE HOSPITAL YOU ARE ALLOWED ONLY TWO SUPPORT PEOPLE DURING VISITATION HOURS ONLY (7 AM -8PM)   The support person(s) must pass our screening, gel in and out, and wear a mask at all times, including in the patients room. Patients must also wear a mask when staff or their support person are in the room. Visitors GUEST BADGE MUST BE WORN VISIBLY  One adult visitor may remain with you overnight and MUST be in the room by 8 P.M.  No visitors under the age of 70. Any visitor under the age of 23 must be accompanied by an adult.        Your procedure is scheduled on: 05/09/21   Report to [facility] Main Entrance    Report to admitting at : 9:30 AM   Call this number if you have problems the morning of surgery [phone]   Do not eat food :After Midnight.   May have liquids until : 8:45 AM   day of surgery  CLEAR LIQUID DIET  Foods Allowed                                                                     Foods Excluded  Water, Black Coffee and tea, regular and decaf                             liquids that you cannot  Plain Jell-O in any flavor  (No red)                                           see through such as: Fruit ices (not with fruit pulp)                                     milk, soups, orange juice              Iced Popsicles (No red)                                    All solid food                                   Apple juices Sports drinks like Gatorade (No red) Lightly seasoned clear broth or consume(fat free) Sugar  Sample Menu Breakfast                                Lunch  Supper Cranberry juice                    Beef broth                            Chicken broth Jell-O                                      Grape juice                           Apple juice Coffee or tea                        Jell-O                                      Popsicle                                                Coffee or tea                        Coffee or tea.    Oral Hygiene is also important to reduce your risk of infection.                                    Remember - BRUSH YOUR TEETH THE MORNING OF SURGERY WITH YOUR REGULAR TOOTHPASTE   Do NOT smoke after Midnight   Take these medicines the morning of surgery with A SIP OF WATER: metoprolol,amlodipine,Proscar.Use inhalers as usual.  DO NOT TAKE ANY ORAL DIABETIC MEDICATIONS DAY OF YOUR SURGERY                              You may not have any metal on your body including hair pins, jewelry, and body piercing             Do not wear lotions, powders, perfumes/cologne, or deodorant               Men may shave face and neck.   Do not bring valuables to the hospital. [place].   Contacts, dentures or bridgework may not be worn into surgery.   Bring small overnight bag day of surgery.    Patients discharged on the day of surgery will not be allowed to drive home.   Special Instructions: Bring a copy of your healthcare power of attorney and living will documents         the day of surgery if you haven't scanned them before.              Please read over the following fact sheets you were given: IF YOU HAVE QUESTIONS ABOUT YOUR PRE-OP INSTRUCTIONS PLEASE CALL [phone]     [facility] - Preparing for Surgery Before surgery, you can play an important  role.  Because skin is not sterile, your skin needs to be as free of germs as possible.  You can reduce the number of germs on your skin by washing with CHG (chlorahexidine gluconate) soap before surgery.  CHG is an antiseptic cleaner which kills germs and bonds with the skin to continue killing germs even after washing. Please DO NOT use if you  have an allergy to CHG or antibacterial soaps.  If your skin becomes reddened/irritated stop using the CHG and inform your nurse when you arrive at Short Stay. Do not shave (including legs and underarms) for at least 48 hours prior to the first CHG shower.  You may shave your face/neck. Please follow these instructions carefully:  1.  Shower with CHG Soap the night before surgery and the  morning of Surgery.  2.  If you choose to wash your hair, wash your hair first as usual with your  normal  shampoo.  3.  After you shampoo, rinse your hair and body thoroughly to remove the  shampoo.                           4.  Use CHG as you would any other liquid soap.  You can apply chg directly  to the skin and wash                       Gently with a scrungie or clean washcloth.  5.  Apply the CHG Soap to your body ONLY FROM THE NECK DOWN.   Do not use on face/ open                           Wound or open sores. Avoid contact with eyes, ears mouth and genitals (private parts).                       Wash face,  Genitals (private parts) with your normal soap.             6.  Wash thoroughly, paying special attention to the area where your surgery  will be performed.  7.  Thoroughly rinse your body with warm water from the neck down.  8.  DO NOT shower/wash with your normal soap after using and rinsing off  the CHG Soap.                9.  Pat yourself dry with a clean towel.            10.  Wear clean pajamas.            11.  Place clean sheets on your bed the night of your first shower and do not  sleep with pets. Day of Surgery : Do not apply any lotions/deodorants the morning of surgery.  Please wear clean clothes to the hospital/surgery center.  FAILURE TO FOLLOW THESE INSTRUCTIONS MAY RESULT IN THE CANCELLATION OF YOUR SURGERY PATIENT SIGNATURE_________________________________  NURSE  SIGNATURE__________________________________  ________________________________________________________________________

## 2021-04-25 ENCOUNTER — Other Ambulatory Visit: Payer: Self-pay

## 2021-04-25 ENCOUNTER — Encounter (HOSPITAL_COMMUNITY)
Admission: RE | Admit: 2021-04-25 | Discharge: 2021-04-25 | Disposition: A | Discharge status: Home / Self Care | Payer: Medicare Other | Source: Ambulatory Visit | Attending: Urology | Admitting: Urology

## 2021-04-25 ENCOUNTER — Encounter (HOSPITAL_COMMUNITY): Payer: Self-pay

## 2021-04-25 VITALS — BP 131/66 | HR 92 | Temp 98.0°F | Ht 63.0 in | Wt 144.0 lb

## 2021-04-25 DIAGNOSIS — Z01812 Encounter for preprocedural laboratory examination: Secondary | ICD-10-CM | POA: Insufficient documentation

## 2021-04-25 DIAGNOSIS — Z01818 Encounter for other preprocedural examination: Secondary | ICD-10-CM

## 2021-04-25 DIAGNOSIS — I10 Essential (primary) hypertension: Secondary | ICD-10-CM | POA: Insufficient documentation

## 2021-04-25 HISTORY — DX: Unspecified osteoarthritis, unspecified site: M19.90

## 2021-04-25 HISTORY — DX: Anemia, unspecified: D64.9

## 2021-04-25 HISTORY — DX: Dyspnea, unspecified: R06.00

## 2021-04-25 LAB — CBC
HCT: 31.6 % — ABNORMAL LOW (ref 39.0–52.0)
Hemoglobin: 10 g/dL — ABNORMAL LOW (ref 13.0–17.0)
MCH: 27.8 pg (ref 26.0–34.0)
MCHC: 31.6 g/dL (ref 30.0–36.0)
MCV: 87.8 fL (ref 80.0–100.0)
Platelets: 580 10*3/uL — ABNORMAL HIGH (ref 150–400)
RBC: 3.6 MIL/uL — ABNORMAL LOW (ref 4.22–5.81)
RDW: 16.8 % — ABNORMAL HIGH (ref 11.5–15.5)
WBC: 10.5 10*3/uL (ref 4.0–10.5)
nRBC: 0 % (ref 0.0–0.2)

## 2021-04-25 LAB — BASIC METABOLIC PANEL
Anion gap: 12 (ref 5–15)
BUN: 44 mg/dL — ABNORMAL HIGH (ref 8–23)
CO2: 23 mmol/L (ref 22–32)
Calcium: 9.2 mg/dL (ref 8.9–10.3)
Chloride: 102 mmol/L (ref 98–111)
Creatinine, Ser: 2.43 mg/dL — ABNORMAL HIGH (ref 0.61–1.24)
GFR, Estimated: 28 mL/min — ABNORMAL LOW (ref >60)
Glucose, Bld: 105 mg/dL — ABNORMAL HIGH (ref 70–99)
Potassium: 4.4 mmol/L (ref 3.5–5.1)
Sodium: 137 mmol/L (ref 135–145)

## 2021-04-25 NOTE — Progress Notes (Signed)
Lab. Results: Cr: 2.43 HGB: 10.0

## 2021-04-25 NOTE — Progress Notes (Signed)
COVID Vaccine Completed: NO Date COVID Vaccine completed: COVID vaccine manufacturer: [place] Test: N/A PCP - Dorothyann Peng: NP Cardiologist - Dr. Guerry Minors. LOV: 04/17/21  Chest x-ray - 03/29/21 EKG - 04/17/21 Stress Test -  ECHO -  Cardiac Cath -  Pacemaker/ICD device last checked:  Sleep Study -  CPAP -   Fasting Blood Sugar -  Checks Blood Sugar _____ times a day  Blood Thinner Instructions: Aspirin Instructions: Last Dose:  Anesthesia review: Hx: HTN,CKD(III),COPD(2L O2 @ night),SOB (continuous)  Patient denies shortness of breath, fever, cough and chest pain at PAT appointment   Patient verbalized understanding of instructions that were given to them at the PAT appointment. Patient was also instructed that they will need to review over the PAT instructions again at home before surgery.

## 2021-04-26 NOTE — Progress Notes (Signed)
Anesthesia Chart Review   Case: [id number] Date/Time: 05/09/21 1130   Procedures:      TRANSURETHRAL RESECTION OF BLADDER TUMOR (TURBT) - 80 MINS     CYSTOSCOPY WITH RETROGRADE PYELOGRAM (Bilateral)   Anesthesia type: General   Pre-op diagnosis: RECURRENT BLADDER CANCER   Location: [facility] PROC[facility] / [facility]   Surgeons: Alexis Frock, MD       DISCUSSION:70 y.o. former smoker with h/o HTN, COPD uses 2L O2 at bedtime, CKD Stage III, BPH, recurrent bladder cancer scheduled for above procedure 05/09/2021 with Dr. Alexis Frock.   Pt seen by pulmonology 04/30/2021. Per OV note, "Samuel [name] is a 70 year old male, former smoker with CKDIII, hyperlipidemia and hypertension who returns to pulmonary clinic for COPD follow up  He has diffuse centrilobular emphysema as demonstrated on recent CT chest lung cancer screening scan He is to continue Breztri inhaler therapy, 2 puffs twice daily.  He can use albuterol inhaler or nebulizer treatments every every 4-6 hours as needed for shortness of breath and wheezing. Patient is low to moderate risk for postoperative pulmonary complications based on the ARISCAT risk index with a 1.6 to 38.7% chance of complication."  Anticipate pt can proceed with planned procedure barring acute status change.   VS: BP 131/66    Pulse 92    Temp 36.7 C (Oral)    Ht 5\' 3"  (1.6 m)    Wt 65.3 kg    SpO2 94%    BMI 25.51 kg/m   PROVIDERS: Dorothyann Peng, NP is PCP   Lenna Sciara, MD is Cardiologist  LABS: Labs reviewed: Acceptable for surgery. (all labs ordered are listed, but only abnormal results are displayed)  Labs Reviewed  BASIC METABOLIC PANEL - Abnormal; Notable for the following components:      Result Value   Glucose, Bld 105 (*)    BUN 44 (*)    Creatinine, Ser 2.43 (*)    GFR, Estimated 28 (*)    All other components within normal limits  CBC - Abnormal; Notable for the following components:   RBC 3.60 (*)    Hemoglobin 10.0 (*)    HCT  31.6 (*)    RDW 16.8 (*)    Platelets 580 (*)    All other components within normal limits  TYPE AND SCREEN     IMAGES:   EKG: 04/17/2021 Rate 73 bpm   CV: Echo 05/04/2021  1. Left ventricular ejection fraction, by estimation, is 60 to 65%. The  left ventricle has normal function. The left ventricle has no regional  wall motion abnormalities. Left ventricular diastolic parameters were  normal. The average left ventricular  global longitudinal strain is -24.5 %. The global longitudinal strain is  normal.   2. Right ventricular systolic function is normal. The right ventricular  size is normal.   3. The mitral valve is normal in structure. No evidence of mitral valve  regurgitation. No evidence of mitral stenosis.   4. The aortic valve is tricuspid. There is mild calcification of the  aortic valve. Aortic valve regurgitation is mild. Aortic valve sclerosis  is present, with no evidence of aortic valve stenosis.   5. The inferior vena cava is normal in size with greater than 50%  respiratory variability, suggesting right atrial pressure of 3 mmHg. Past Medical History:  Diagnosis Date   Anemia    Anxiety    Arthritis    Bladder cancer [facility])    urologist--- dr Tresa Moore   BPH  with obstruction/lower urinary tract symptoms    CKD (chronic kidney disease), stage III (HCC)    Congenital deformity of left hand    COPD (chronic obstructive pulmonary disease) ([place])    followed by pcp   Depression    Dyspnea    Essential hypertension    followed by pcp   History of adenomatous polyp of colon    Hyperlipidemia    Renal cysts, acquired, bilateral    Wears glasses     Past Surgical History:  Procedure Laterality Date   COLONOSCOPY WITH PROPOFOL  last one 02-04-2017  dr Henrene Pastor   CYSTOSCOPY W/ RETROGRADES Bilateral 09/15/2020   Procedure: CYSTOSCOPY WITH RETROGRADE PYELOGRAM;  Surgeon: Alexis Frock, MD;  Location[facility];  Service: Urology;  Laterality:  Bilateral;   IR NEPHROSTOMY PLACEMENT LEFT  03/30/2021   IR NEPHROSTOMY PLACEMENT RIGHT  04/02/2021   TRANSURETHRAL RESECTION OF BLADDER TUMOR N/A 09/15/2020   Procedure: BLADDER BIOPSY FULGERATION;  Surgeon: Alexis Frock, MD;  Location[facility];  Service: Urology;  Laterality: N/A;  45 MINS    MEDICATIONS:  acetaminophen (TYLENOL) 325 MG tablet   albuterol (PROVENTIL) (2.5 MG/3ML) 0.083% nebulizer solution   albuterol (VENTOLIN HFA) 108 (90 Base) MCG/ACT inhaler   amLODipine (NORVASC) 10 MG tablet   aspirin EC 81 MG tablet   atorvastatin (LIPITOR) 40 MG tablet   Budeson-Glycopyrrol-Formoterol (BREZTRI AEROSPHERE) 160-9-4.8 MCG/ACT AERO   Cholecalciferol (VITAMIN D3 PO)   citalopram (CELEXA) 20 MG tablet   Cyanocobalamin (B-12 PO)   diclofenac Sodium (VOLTAREN) 1 % GEL   finasteride (PROSCAR) 5 MG tablet   metoprolol succinate (TOPROL-XL) 25 MG 24 hr tablet   oxyCODONE (OXY IR/ROXICODONE) 5 MG immediate release tablet   OXYGEN   sodium bicarbonate 650 MG tablet   tamsulosin (FLOMAX) 0.4 MG CAPS capsule   No current facility-administered medications for this encounter.    Samuel Felix Ward, PA-C [facility] [phone]

## 2021-04-27 ENCOUNTER — Ambulatory Visit: Payer: Medicare Other | Admitting: Adult Health

## 2021-04-30 ENCOUNTER — Other Ambulatory Visit: Payer: Self-pay

## 2021-04-30 ENCOUNTER — Encounter: Payer: Self-pay | Admitting: Pulmonary Disease

## 2021-04-30 ENCOUNTER — Telehealth: Payer: Self-pay | Admitting: Pulmonary Disease

## 2021-04-30 ENCOUNTER — Ambulatory Visit: Payer: Medicare Other | Admitting: Pulmonary Disease

## 2021-04-30 VITALS — BP 114/62 | HR 66 | Ht 63.0 in | Wt 149.0 lb

## 2021-04-30 DIAGNOSIS — J432 Centrilobular emphysema: Secondary | ICD-10-CM

## 2021-04-30 NOTE — Progress Notes (Signed)
Synopsis: Referred in June 2022 for COPD by Dorothyann Peng, NP  Subjective:   PATIENT ID: Samuel [name] GENDER: male DOB: Jul 10, 1950, MRN: [id number]  HPI  Chief Complaint  Patient presents with   Follow-up    1 mo f/u after flare up. Spent a week in the hospital. States he has been doing so-so since last visit.    Nehemias Sauceda is a 70 year old male, former smoker with CKDIII, hyperlipidemia and hypertension who returns to pulmonary clinic for COPD follow up.  He was seen on 03/29/21 by Marland Kitchen, NP for an acute sick visit with increasing shortness of breath and pre-syncopal episode. Chest radiograph was obtained with no acute changes. Laboratory workup showed worsening renal function and he was advised to go to the ER. He was admitted 11/18 to 11/22 for AKI and obstructive uropathy due to recurrent urothelial cancer. He is being followed by Nephrology and Urology. He is scheduled for bladder surgery on 12/28 with Urology for evaluation of the cancer involvement.   Since admission he has been doing well from a respiratory standpoint. Intermittent wheezing reported. No cough or sputum production. The exertional dyspnea is back to baseline.  OV 11/08/20 He reports very limited physical activity due to limitations from his shortness of breath.  He lives a fairly sedentary life at this time.  He is accompanied by his wife today.  He is currently on Breztri 2 puffs twice daily where he transitioned from Trelegy back and January 2022 with notable improvement in his wheezing.  He does report increased frequency of wheezing in the evening that he notices when going to bed.  He had lung cancer screening CT chest 09/27/2020 with findings of diffuse centrilobular emphysema and no concerning nodules.  He does not have a nebulizer machine at home.  Past Medical History:  Diagnosis Date   Anemia    Anxiety    Arthritis    Bladder cancer [facility])    urologist--- dr Tresa Moore   BPH  with obstruction/lower urinary tract symptoms    CKD (chronic kidney disease), stage III ([place])    Congenital deformity of left hand    COPD (chronic obstructive pulmonary disease) ([place])    followed by pcp   Depression    Dyspnea    Essential hypertension    followed by pcp   History of adenomatous polyp of colon    Hyperlipidemia    Renal cysts, acquired, bilateral    Wears glasses      Family History  Problem Relation Age of Onset   Hypertension Mother    Stroke Mother    Heart disease Father    Breast cancer Sister    Prostate cancer Brother    Hypertension Sister    Colon cancer Neg Hx    Esophageal cancer Neg Hx    Pancreatic cancer Neg Hx    Rectal cancer Neg Hx    Stomach cancer Neg Hx      Social History   Socioeconomic History   Marital status: Married    Spouse name: Not on file   Number of children: Not on file   Years of education: Not on file   Highest education level: Not on file  Occupational History   Occupation: retired  Tobacco Use   Smoking status: Former    Years: 58.00    Types: Cigarettes    Quit date: 01/16/2017    Years since quitting: 4.2   Smokeless tobacco: Never  Vaping Use  Vaping Use: Never used  Substance and Sexual Activity   Alcohol use: No   Drug use: Never   Sexual activity: Not on file  Other Topics Concern   Not on file  Social History Narrative   Retired    Married       He watches television   Social Determinants of Health   Financial Resource Strain: Medium Risk   Difficulty of Paying Living Expenses: Somewhat hard  Food Insecurity: No Food Insecurity   Worried About Charity fundraiser in the Last Year: Never true   Arboriculturist in the Last Year: Never true  Transportation Needs: No Transportation Needs   Lack of Transportation (Medical): No   Lack of Transportation (Non-Medical): No  Physical Activity: Inactive   Days of Exercise per Week: 0 days   Minutes of Exercise per Session: 0 min  Stress: No  Stress Concern Present   Feeling of Stress : Not at all  Social Connections: Moderately Isolated   Frequency of Communication with Friends and Family: More than three times a week   Frequency of Social Gatherings with Friends and Family: More than three times a week   Attends Religious Services: Never   Marine scientist or Organizations: No   Attends Music therapist: Never   Marital Status: Married  Human resources officer Violence: Not At Risk   Fear of Current or Ex-Partner: No   Emotionally Abused: No   Physically Abused: No   Sexually Abused: No     Allergies  Allergen Reactions   Penicillins Nausea And Vomiting     Outpatient Medications Prior to Visit  Medication Sig Dispense Refill   acetaminophen (TYLENOL) 325 MG tablet Take 650 mg by mouth every 6 (six) hours as needed for moderate pain.     albuterol (PROVENTIL) (2.5 MG/3ML) 0.083% nebulizer solution Take 2.5 mg by nebulization in the morning and at bedtime.     albuterol (VENTOLIN HFA) 108 (90 Base) MCG/ACT inhaler Inhale 2 puffs into the lungs every 6 (six) hours as needed for wheezing or shortness of breath. 8 g 6   amLODipine (NORVASC) 10 MG tablet Take 10 mg by mouth at bedtime.     aspirin EC 81 MG tablet Take 1 tablet (81 mg total) by mouth daily. Swallow whole. 90 tablet 3   atorvastatin (LIPITOR) 40 MG tablet Take 1 tablet (40 mg total) by mouth daily. 90 tablet 3   Budeson-Glycopyrrol-Formoterol (BREZTRI AEROSPHERE) 160-9-4.8 MCG/ACT AERO Inhale 2 puffs into the lungs in the morning and at bedtime. 11.8 g 11   Cholecalciferol (VITAMIN D3 PO) Take 1 tablet by mouth daily.     citalopram (CELEXA) 20 MG tablet Take 1 tablet (20 mg total) by mouth daily. (Patient taking differently: Take 20 mg by mouth at bedtime.) 90 tablet 1   Cyanocobalamin (B-12 PO) Take 1 capsule by mouth daily.     finasteride (PROSCAR) 5 MG tablet Take 5 mg by mouth daily.     metoprolol succinate (TOPROL-XL) 25 MG 24 hr tablet  Take 25 mg by mouth daily.     oxyCODONE (OXY IR/ROXICODONE) 5 MG immediate release tablet Take 1 tablet (5 mg total) by mouth every 6 (six) hours as needed for moderate pain. 10 tablet 0   OXYGEN Inhale 2 L into the lungs See admin instructions. 2L when taking a nap and at bedtime     tamsulosin (FLOMAX) 0.4 MG CAPS capsule Take 2 capsules by mouth at  bedtime.     diclofenac Sodium (VOLTAREN) 1 % GEL Apply 2 g topically 2 (two) times daily. (Patient not taking: Reported on 04/20/2021) 150 g 0   sodium bicarbonate 650 MG tablet Take 1 tablet (650 mg total) by mouth 2 (two) times daily. (Patient not taking: Reported on 04/20/2021) 60 tablet 0   No facility-administered medications prior to visit.    Review of Systems  Constitutional:  Negative for chills, fever, malaise/fatigue and weight loss.  HENT:  Negative for congestion, sinus pain and sore throat.   Eyes: Negative.   Respiratory:  Positive for shortness of breath and wheezing. Negative for cough, hemoptysis and sputum production.   Cardiovascular:  Negative for chest pain, palpitations, orthopnea, claudication and leg swelling.  Gastrointestinal:  Negative for abdominal pain, heartburn, nausea and vomiting.  Genitourinary: Negative.   Musculoskeletal:  Negative for joint pain and myalgias.  Skin:  Negative for rash.  Neurological:  Negative for weakness.  Endo/Heme/Allergies: Negative.   Psychiatric/Behavioral: Negative.       Objective:   Vitals:   04/30/21 0923  BP: 114/62  Pulse: 66  SpO2: 97%  Weight: 149 lb (67.6 kg)  Height: 5\' 3"  (1.6 m)     Physical Exam Constitutional:      General: He is not in acute distress.    Appearance: Normal appearance.  HENT:     Head: Normocephalic and atraumatic.  Eyes:     Extraocular Movements: Extraocular movements intact.     Conjunctiva/sclera: Conjunctivae normal.     Pupils: Pupils are equal, round, and reactive to light.  Cardiovascular:     Rate and Rhythm: Normal rate  and regular rhythm.     Pulses: Normal pulses.     Heart sounds: Normal heart sounds. No murmur heard. Pulmonary:     Effort: Pulmonary effort is normal.     Breath sounds: Normal breath sounds. No wheezing, rhonchi or rales.  Abdominal:     General: Bowel sounds are normal.     Palpations: Abdomen is soft.  Musculoskeletal:        General: Deformity (LUE) present.     Right lower leg: No edema.     Left lower leg: No edema.  Lymphadenopathy:     Cervical: No cervical adenopathy.  Skin:    General: Skin is warm and dry.  Neurological:     General: No focal deficit present.     Mental Status: He is alert.  Psychiatric:        Mood and Affect: Mood normal.        Behavior: Behavior normal.        Thought Content: Thought content normal.        Judgment: Judgment normal.   CBC    Component Value Date/Time   WBC 10.5 04/25/2021 0820   RBC 3.60 (L) 04/25/2021 0820   HGB 10.0 (L) 04/25/2021 0820   HCT 31.6 (L) 04/25/2021 0820   PLT 580 (H) 04/25/2021 0820   MCV 87.8 04/25/2021 0820   MCH 27.8 04/25/2021 0820   MCHC 31.6 04/25/2021 0820   RDW 16.8 (H) 04/25/2021 0820   LYMPHSABS 1.0 04/10/2021 1611   MONOABS 1.4 (H) 04/10/2021 1611   EOSABS 0.4 04/10/2021 1611   BASOSABS 0.1 04/10/2021 1611   BMP Latest Ref Rng & Units 04/25/2021 04/10/2021 04/03/2021  Glucose 70 - 99 mg/dL 105(H) 97 107(H)  BUN 8 - 23 mg/dL 44(H) 43(H) 49(H)  Creatinine 0.61 - 1.24 mg/dL 2.43(H) 2.67(H) 3.73(H)  BUN/Creat  Ratio 6 - 22 (calc) - - -  Sodium 135 - 145 mmol/L 137 139 139  Potassium 3.5 - 5.1 mmol/L 4.4 4.0 4.0  Chloride 98 - 111 mmol/L 102 102 108  CO2 22 - 32 mmol/L 23 25 22   Calcium 8.9 - 10.3 mg/dL 9.2 9.3 8.3(L)   Chest imaging: CXR 03/30/21 Cardiac and mediastinal contours are unchanged. Bilateral linear opacities, likely related to underlying emphysema. No new parenchymal process. No large pleural effusion or evidence of pneumothorax.  CT Lung Cancer Screening 09/27/20 1.  Lung-RADS 2, benign appearance or behavior. Continue annual screening with low-dose chest CT without contrast in 12 months. 2.  Emphysema  PFT: No flowsheet data found.  EKG 09/15/20 Normal Sinus rhythm, normal intervals, possible LVH  Assessment & Plan:   Centrilobular emphysema ([place])  Discussion: Samuel [name] is a 70 year old male, former smoker with CKDIII, hyperlipidemia and hypertension who returns to pulmonary clinic for COPD follow up.  He has diffuse centrilobular emphysema as demonstrated on recent CT chest lung cancer screening scan.  He is to continue Breztri inhaler therapy, 2 puffs twice daily.  He can use albuterol inhaler or nebulizer treatments every every 4-6 hours as needed for shortness of breath and wheezing.   Patient is low to moderate risk for postoperative pulmonary complications based on the ARISCAT risk index with a 1.6 to 40.9% chance of complication.  Follow-up in 3 months with pulmonary function testing.  Freda Jackson, MD [facility] Office: [phone]    Current Outpatient Medications:    acetaminophen (TYLENOL) 325 MG tablet, Take 650 mg by mouth every 6 (six) hours as needed for moderate pain., Disp: , Rfl:    albuterol (PROVENTIL) (2.5 MG/3ML) 0.083% nebulizer solution, Take 2.5 mg by nebulization in the morning and at bedtime., Disp: , Rfl:    albuterol (VENTOLIN HFA) 108 (90 Base) MCG/ACT inhaler, Inhale 2 puffs into the lungs every 6 (six) hours as needed for wheezing or shortness of breath., Disp: 8 g, Rfl: 6   amLODipine (NORVASC) 10 MG tablet, Take 10 mg by mouth at bedtime., Disp: , Rfl:    aspirin EC 81 MG tablet, Take 1 tablet (81 mg total) by mouth daily. Swallow whole., Disp: 90 tablet, Rfl: 3   atorvastatin (LIPITOR) 40 MG tablet, Take 1 tablet (40 mg total) by mouth daily., Disp: 90 tablet, Rfl: 3   Budeson-Glycopyrrol-Formoterol (BREZTRI AEROSPHERE) 160-9-4.8 MCG/ACT AERO, Inhale 2 puffs into the lungs  in the morning and at bedtime., Disp: 11.8 g, Rfl: 11   Cholecalciferol (VITAMIN D3 PO), Take 1 tablet by mouth daily., Disp: , Rfl:    citalopram (CELEXA) 20 MG tablet, Take 1 tablet (20 mg total) by mouth daily. (Patient taking differently: Take 20 mg by mouth at bedtime.), Disp: 90 tablet, Rfl: 1   Cyanocobalamin (B-12 PO), Take 1 capsule by mouth daily., Disp: , Rfl:    finasteride (PROSCAR) 5 MG tablet, Take 5 mg by mouth daily., Disp: , Rfl:    metoprolol succinate (TOPROL-XL) 25 MG 24 hr tablet, Take 25 mg by mouth daily., Disp: , Rfl:    oxyCODONE (OXY IR/ROXICODONE) 5 MG immediate release tablet, Take 1 tablet (5 mg total) by mouth every 6 (six) hours as needed for moderate pain., Disp: 10 tablet, Rfl: 0   OXYGEN, Inhale 2 L into the lungs See admin instructions. 2L when taking a nap and at bedtime, Disp: , Rfl:    tamsulosin (FLOMAX) 0.4 MG CAPS capsule,  Take 2 capsules by mouth at bedtime., Disp: , Rfl:

## 2021-04-30 NOTE — Patient Instructions (Signed)
Continue Breztri inhaler 2 puffs twice daily  Continue as needed albuterol inhaler or nebulizer treatment.   Good luck with your surgery and Happy Holidays!  Consider checking with urology about larger urine bags in order to change the bags less at night.

## 2021-05-01 ENCOUNTER — Encounter: Payer: Self-pay | Admitting: Pulmonary Disease

## 2021-05-01 NOTE — Telephone Encounter (Signed)
Spoke with pt and he wants to know if we could send a handicap packard application approved by Dr. Erin Fulling to him. Pt would like the application mailed once signed. Dr. Erin Fulling please advise.

## 2021-05-03 NOTE — Telephone Encounter (Signed)
Called and spoke with patient. He is aware that JD is ok with the handicap placard. He wishes to have this mailed to him. I verified his address. Will place in the mail once it has been signed.

## 2021-05-04 ENCOUNTER — Other Ambulatory Visit: Payer: Self-pay

## 2021-05-04 ENCOUNTER — Ambulatory Visit (HOSPITAL_COMMUNITY): Payer: Medicare Other | Attending: Cardiology

## 2021-05-04 DIAGNOSIS — R0600 Dyspnea, unspecified: Secondary | ICD-10-CM | POA: Diagnosis not present

## 2021-05-04 DIAGNOSIS — I10 Essential (primary) hypertension: Secondary | ICD-10-CM

## 2021-05-04 DIAGNOSIS — I2510 Atherosclerotic heart disease of native coronary artery without angina pectoris: Secondary | ICD-10-CM | POA: Diagnosis not present

## 2021-05-04 DIAGNOSIS — J432 Centrilobular emphysema: Secondary | ICD-10-CM

## 2021-05-04 DIAGNOSIS — E782 Mixed hyperlipidemia: Secondary | ICD-10-CM

## 2021-05-04 LAB — ECHOCARDIOGRAM COMPLETE
AR max vel: 2.35 cm2
AV Area VTI: 2.29 cm2
AV Area mean vel: 2.22 cm2
AV Mean grad: 8 mmHg
AV Peak grad: 14.9 mmHg
Ao pk vel: 1.93 m/s
Area-P 1/2: 3.42 cm2
P 1/2 time: 427 msec
S' Lateral: 3.3 cm

## 2021-05-04 MED ORDER — PERFLUTREN LIPID MICROSPHERE
1.0000 mL | INTRAVENOUS | Status: AC | PRN
Start: 2021-05-04 — End: 2021-05-04
  Administered 2021-05-04: 2 mL via INTRAVENOUS

## 2021-05-04 NOTE — Anesthesia Preprocedure Evaluation (Addendum)
Anesthesia Evaluation  Patient identified by MRN, date of birth, ID band Patient awake    Reviewed: Allergy & Precautions, NPO status , Patient's Chart, lab work & pertinent test results  Airway Mallampati: II  TM Distance: >3 FB Neck ROM: Full    Dental  (+) Loose, Poor Dentition, Chipped,    Pulmonary shortness of breath and Long-Term Oxygen Therapy, COPD,  oxygen dependent, former smoker,    Pulmonary exam normal breath sounds clear to auscultation       Cardiovascular hypertension, Pt. on medications and Pt. on home beta blockers Normal cardiovascular exam Rhythm:Regular Rate:Normal  ECG: NSR, rate 73  ECHO: Left ventricular ejection fraction, by estimation, is 60 to 65%. The left ventricle has normal function. The left ventricle has no regional wall motion abnormalities. Left ventricular diastolic parameters were normal. The average left ventricular global longitudinal strain is -24.5 %. The global longitudinal strain is normal. Right ventricular systolic function is normal. The right ventricular size is normal. The mitral valve is normal in structure. No evidence of mitral valve regurgitation. No evidence of mitral stenosis. The aortic valve is tricuspid. There is mild calcification of the aortic valve. Aortic valve regurgitation is mild. Aortic valve sclerosis is present, with no evidence of aortic valve stenosis. The inferior vena cava is normal in size with greater than 50% respiratory variability, suggesting right atrial pressure of 3 mmHg.   Neuro/Psych PSYCHIATRIC DISORDERS Anxiety Depression negative neurological ROS     GI/Hepatic negative GI ROS, Neg liver ROS,   Endo/Other  negative endocrine ROS  Renal/GU Renal disease     Musculoskeletal  (+) Arthritis ,   Abdominal   Peds  Hematology negative hematology ROS (+)   Anesthesia Other Findings RECURRENT BLADDER CANCER  Reproductive/Obstetrics                            Anesthesia Physical Anesthesia Plan  ASA: 4  Anesthesia Plan: General   Post-op Pain Management:    Induction: Intravenous  PONV Risk Score and Plan: 2 and Ondansetron, Dexamethasone and Treatment may vary due to age or medical condition  Airway Management Planned: LMA  Additional Equipment:   Intra-op Plan:   Post-operative Plan: Extubation in OR  Informed Consent: I have reviewed the patients History and Physical, chart, labs and discussed the procedure including the risks, benefits and alternatives for the proposed anesthesia with the patient or authorized representative who has indicated his/her understanding and acceptance.     Dental advisory given  Plan Discussed with: CRNA  Anesthesia Plan Comments: (Reviewed PAT note 04/25/2021, Konrad Felix Ward, PA-C)       Anesthesia Quick Evaluation

## 2021-05-09 ENCOUNTER — Ambulatory Visit (HOSPITAL_COMMUNITY): Payer: Medicare Other | Admitting: Certified Registered Nurse Anesthetist

## 2021-05-09 ENCOUNTER — Encounter (HOSPITAL_COMMUNITY)
Admission: RE | Disposition: A | Discharge status: Home / Self Care | Payer: Self-pay | Source: Home / Self Care | Attending: Urology

## 2021-05-09 ENCOUNTER — Encounter (HOSPITAL_COMMUNITY): Payer: Self-pay | Admitting: Urology

## 2021-05-09 ENCOUNTER — Ambulatory Visit (HOSPITAL_COMMUNITY): Payer: Medicare Other | Admitting: Physician Assistant

## 2021-05-09 ENCOUNTER — Ambulatory Visit (HOSPITAL_COMMUNITY)
Admission: RE | Admit: 2021-05-09 | Discharge: 2021-05-09 | Disposition: A | Discharge status: Home / Self Care | Payer: Medicare Other | Attending: Urology | Admitting: Urology

## 2021-05-09 ENCOUNTER — Encounter (HOSPITAL_BASED_OUTPATIENT_CLINIC_OR_DEPARTMENT_OTHER): Payer: Medicare Other | Admitting: Pulmonary Disease

## 2021-05-09 DIAGNOSIS — N281 Cyst of kidney, acquired: Secondary | ICD-10-CM | POA: Diagnosis not present

## 2021-05-09 DIAGNOSIS — I129 Hypertensive chronic kidney disease with stage 1 through stage 4 chronic kidney disease, or unspecified chronic kidney disease: Secondary | ICD-10-CM | POA: Insufficient documentation

## 2021-05-09 DIAGNOSIS — F419 Anxiety disorder, unspecified: Secondary | ICD-10-CM | POA: Diagnosis not present

## 2021-05-09 DIAGNOSIS — Z79899 Other long term (current) drug therapy: Secondary | ICD-10-CM | POA: Insufficient documentation

## 2021-05-09 DIAGNOSIS — N289 Disorder of kidney and ureter, unspecified: Secondary | ICD-10-CM | POA: Diagnosis not present

## 2021-05-09 DIAGNOSIS — F32A Depression, unspecified: Secondary | ICD-10-CM | POA: Diagnosis not present

## 2021-05-09 DIAGNOSIS — Z9981 Dependence on supplemental oxygen: Secondary | ICD-10-CM | POA: Insufficient documentation

## 2021-05-09 DIAGNOSIS — N3289 Other specified disorders of bladder: Secondary | ICD-10-CM | POA: Diagnosis present

## 2021-05-09 DIAGNOSIS — N1339 Other hydronephrosis: Secondary | ICD-10-CM | POA: Diagnosis not present

## 2021-05-09 DIAGNOSIS — C675 Malignant neoplasm of bladder neck: Secondary | ICD-10-CM | POA: Insufficient documentation

## 2021-05-09 DIAGNOSIS — Z87891 Personal history of nicotine dependence: Secondary | ICD-10-CM | POA: Insufficient documentation

## 2021-05-09 DIAGNOSIS — N1832 Chronic kidney disease, stage 3b: Secondary | ICD-10-CM | POA: Diagnosis not present

## 2021-05-09 DIAGNOSIS — J449 Chronic obstructive pulmonary disease, unspecified: Secondary | ICD-10-CM | POA: Insufficient documentation

## 2021-05-09 HISTORY — PX: TRANSURETHRAL RESECTION OF BLADDER TUMOR: SHX2575

## 2021-05-09 HISTORY — PX: CYSTOSCOPY W/ RETROGRADES: SHX1426

## 2021-05-09 LAB — TYPE AND SCREEN
ABO/RH(D): A POS
ABO/RH(D): A POS
Antibody Screen: NEGATIVE
Antibody Screen: NEGATIVE

## 2021-05-09 SURGERY — TURBT (TRANSURETHRAL RESECTION OF BLADDER TUMOR)
Anesthesia: General | Site: Bladder

## 2021-05-09 MED ORDER — DEXAMETHASONE SODIUM PHOSPHATE 10 MG/ML IJ SOLN
INTRAMUSCULAR | Status: AC
  Filled 2021-05-09: qty 1

## 2021-05-09 MED ORDER — LIDOCAINE 2% (20 MG/ML) 5 ML SYRINGE
INTRAMUSCULAR | Status: AC
  Filled 2021-05-09: qty 5

## 2021-05-09 MED ORDER — ONDANSETRON HCL 4 MG/2ML IJ SOLN
INTRAMUSCULAR | Status: AC
  Filled 2021-05-09: qty 2

## 2021-05-09 MED ORDER — CHLORHEXIDINE GLUCONATE 0.12 % MT SOLN
15.0000 mL | Freq: Once | OROMUCOSAL | Status: AC
  Administered 2021-05-09: 10:00:00 15 mL via OROMUCOSAL

## 2021-05-09 MED ORDER — ONDANSETRON HCL 4 MG/2ML IJ SOLN: 4.0000 mg | Freq: Once | INTRAMUSCULAR | Status: DC | PRN

## 2021-05-09 MED ORDER — DEXAMETHASONE SODIUM PHOSPHATE 4 MG/ML IJ SOLN
INTRAMUSCULAR | Status: DC | PRN
Start: 2021-05-09 — End: 2021-05-09
  Administered 2021-05-09: 5 mg via INTRAVENOUS

## 2021-05-09 MED ORDER — FENTANYL CITRATE (PF) 100 MCG/2ML IJ SOLN
INTRAMUSCULAR | Status: DC | PRN
  Administered 2021-05-09: 50 ug via INTRAVENOUS
  Administered 2021-05-09: 25 ug via INTRAVENOUS

## 2021-05-09 MED ORDER — LIDOCAINE 2% (20 MG/ML) 5 ML SYRINGE
INTRAMUSCULAR | Status: DC | PRN
  Administered 2021-05-09: 40 mL via INTRAVENOUS

## 2021-05-09 MED ORDER — SODIUM CHLORIDE 0.9 % IR SOLN
Status: DC | PRN
  Administered 2021-05-09 (×5): 3000 mL via INTRAVESICAL

## 2021-05-09 MED ORDER — FENTANYL CITRATE PF 50 MCG/ML IJ SOSY: 25.0000 ug | PREFILLED_SYRINGE | INTRAMUSCULAR | Status: DC | PRN

## 2021-05-09 MED ORDER — EPHEDRINE SULFATE-NACL 50-0.9 MG/10ML-% IV SOSY
PREFILLED_SYRINGE | INTRAVENOUS | Status: DC | PRN
  Administered 2021-05-09: 5 mg via INTRAVENOUS
  Administered 2021-05-09: 10 mg via INTRAVENOUS
  Administered 2021-05-09: 5 mg via INTRAVENOUS

## 2021-05-09 MED ORDER — AMISULPRIDE (ANTIEMETIC) 5 MG/2ML IV SOLN: 5.0000 mg | Freq: Once | INTRAVENOUS | Status: DC | PRN

## 2021-05-09 MED ORDER — ONDANSETRON HCL 4 MG/2ML IJ SOLN
INTRAMUSCULAR | Status: DC | PRN
  Administered 2021-05-09: 4 mg via INTRAVENOUS

## 2021-05-09 MED ORDER — LACTATED RINGERS IV SOLN: INTRAVENOUS | Status: DC

## 2021-05-09 MED ORDER — PROPOFOL 10 MG/ML IV BOLUS
INTRAVENOUS | Status: DC | PRN
  Administered 2021-05-09: 200 mg via INTRAVENOUS

## 2021-05-09 MED ORDER — CIPROFLOXACIN IN D5W 400 MG/200ML IV SOLN
INTRAVENOUS | Status: AC
  Filled 2021-05-09: qty 200

## 2021-05-09 MED ORDER — ORAL CARE MOUTH RINSE: 15.0000 mL | Freq: Once | OROMUCOSAL | Status: AC

## 2021-05-09 MED ORDER — PROPOFOL 10 MG/ML IV BOLUS
INTRAVENOUS | Status: AC
  Filled 2021-05-09: qty 20

## 2021-05-09 MED ORDER — OXYCODONE HCL 5 MG PO TABS: 5.0000 mg | ORAL_TABLET | Freq: Four times a day (QID) | ORAL | 0 refills | Status: DC | PRN

## 2021-05-09 MED ORDER — SODIUM CHLORIDE 0.9 % IV SOLN: INTRAVENOUS | Status: DC | PRN

## 2021-05-09 MED ORDER — FENTANYL CITRATE (PF) 100 MCG/2ML IJ SOLN
INTRAMUSCULAR | Status: AC
  Filled 2021-05-09: qty 2

## 2021-05-09 MED ORDER — CIPROFLOXACIN IN D5W 400 MG/200ML IV SOLN
400.0000 mg | Freq: Once | INTRAVENOUS | Status: AC
  Administered 2021-05-09: 12:00:00 400 mg via INTRAVENOUS

## 2021-05-09 MED ORDER — EPHEDRINE 5 MG/ML INJ
INTRAVENOUS | Status: AC
  Filled 2021-05-09: qty 5

## 2021-05-09 MED ORDER — PHENYLEPHRINE 40 MCG/ML (10ML) SYRINGE FOR IV PUSH (FOR BLOOD PRESSURE SUPPORT)
PREFILLED_SYRINGE | INTRAVENOUS | Status: DC | PRN
  Administered 2021-05-09: 120 ug via INTRAVENOUS

## 2021-05-09 MED ORDER — PHENYLEPHRINE 40 MCG/ML (10ML) SYRINGE FOR IV PUSH (FOR BLOOD PRESSURE SUPPORT)
PREFILLED_SYRINGE | INTRAVENOUS | Status: AC
  Filled 2021-05-09: qty 10

## 2021-05-09 SURGICAL SUPPLY — 24 items
BAG COUNTER SPONGE SURGICOUNT (BAG) IMPLANT
BAG DRN RND TRDRP ANRFLXCHMBR (UROLOGICAL SUPPLIES)
BAG SPNG CNTER NS LX DISP (BAG)
BAG SURGICOUNT SPONGE COUNTING (BAG)
BAG URINE DRAIN 2000ML AR STRL (UROLOGICAL SUPPLIES) IMPLANT
BAG URO CATCHER STRL LF (MISCELLANEOUS) ×5 IMPLANT
CATH URETL OPEN END 6FR 70 (CATHETERS) IMPLANT
CLOTH BEACON ORANGE TIMEOUT ST (SAFETY) ×5 IMPLANT
DRAPE FOOT SWITCH (DRAPES) ×5 IMPLANT
ELECT REM PT RETURN 15FT ADLT (MISCELLANEOUS) ×5 IMPLANT
EVACUATOR MICROVAS BLADDER (UROLOGICAL SUPPLIES) IMPLANT
GLOVE SURG ENC TEXT LTX SZ7.5 (GLOVE) ×5 IMPLANT
GOWN STRL REUS W/TWL LRG LVL3 (GOWN DISPOSABLE) ×10 IMPLANT
GUIDEWIRE STR DUAL SENSOR (WIRE) ×5 IMPLANT
KIT TURNOVER KIT A (KITS) IMPLANT
LOOP CUT BIPOLAR 24F LRG (ELECTROSURGICAL) IMPLANT
MANIFOLD NEPTUNE II (INSTRUMENTS) ×5 IMPLANT
NS IRRIG 1000ML POUR BTL (IV SOLUTION) IMPLANT
PACK CYSTO (CUSTOM PROCEDURE TRAY) ×5 IMPLANT
SYR TOOMEY IRRIG 70ML (MISCELLANEOUS)
SYRINGE TOOMEY IRRIG 70ML (MISCELLANEOUS) IMPLANT
TUBING CONNECTING 10 (TUBING) ×4 IMPLANT
TUBING CONNECTING 10' (TUBING) ×1
TUBING UROLOGY SET (TUBING) ×5 IMPLANT

## 2021-05-09 NOTE — Brief Op Note (Signed)
05/09/2021  12:59 PM  PATIENT:  Burlene Arnt D'Annunzio  70 y.o. male  PRE-OPERATIVE DIAGNOSIS:  RECURRENT BLADDER CANCER  POST-OPERATIVE DIAGNOSIS:  RECURRENT BLADDER CANCER  PROCEDURE:  Procedure(s) with comments: TRANSURETHRAL RESECTION OF BLADDER TUMOR (TURBT) (N/A) - 75 MINS CYSTOSCOPY (Bilateral)  SURGEON:  Surgeon(s) and Role:    * Alexis Frock, MD - Primary  PHYSICIAN ASSISTANT:   ASSISTANTS: none   ANESTHESIA:   general  EBL:  0 mL   BLOOD ADMINISTERED:none  DRAINS:  bilateral nephrostomy tubes    LOCAL MEDICATIONS USED:  NONE  SPECIMEN:  Source of Specimen:  bladder neck / prostate mass  DISPOSITION OF SPECIMEN:  PATHOLOGY  COUNTS:  YES  TOURNIQUET:  * No tourniquets in log *  DICTATION: .Other Dictation: Dictation Number [id number]  PLAN OF CARE: Discharge to home after PACU  PATIENT DISPOSITION:  PACU - hemodynamically stable.   Delay start of Pharmacological VTE agent (>24hrs) due to surgical blood loss or risk of bleeding: yes

## 2021-05-09 NOTE — Anesthesia Postprocedure Evaluation (Signed)
Anesthesia Post Note  Patient: Samuel [name]  Procedure(s) Performed: TRANSURETHRAL RESECTION OF BLADDER TUMOR (TURBT) (Bladder) CYSTOSCOPY (Bilateral: Bladder)     Patient location during evaluation: PACU Anesthesia Type: General Level of consciousness: awake Pain management: pain level controlled Vital Signs Assessment: post-procedure vital signs reviewed and stable Respiratory status: spontaneous breathing, nonlabored ventilation, respiratory function stable and patient connected to nasal cannula oxygen Cardiovascular status: blood pressure returned to baseline and stable Postop Assessment: no apparent nausea or vomiting Anesthetic complications: no   No notable events documented.  Last Vitals:  Vitals:   05/09/21 1330 05/09/21 1345  BP: 125/77 119/82  Pulse: 85 81  Resp: 15 11  Temp: 36.6 C 36.6 C  SpO2: 94% 93%    Last Pain:  Vitals:   05/09/21 1345  TempSrc:   PainSc: 0-No pain                 Takirah Binford P Kadian Barcellos

## 2021-05-09 NOTE — Op Note (Signed)
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   PROCEDURE: Transurethral resection of bladder tumor, volume large.  ESTIMATED BLOOD LOSS:  50 mL  COMPLICATIONS:  None.  SPECIMEN:  Large bladder neck and prostate mass fragments for pathology.  FINDINGS:  Very large estimated at least 7 cm bladder neck mass contiguous with prostate with invasion of trigone highly worrisome for stage 3/stage 4 bladder primary versus prostate primary neoplasm.  INDICATIONS:  The patient is a very comorbid 70 year old male with a history of cardiopulmonary disease, COPD, on chronic oxygen and found to have high-grade superficial bladder cancer in 2021.  He underwent local resection of this and induction of BCG.   Amazingly, his surveillance on cystoscopy has been unremarkable.  He subsequently developed acute renal failure with bilateral hydronephrosis and CT imaging highly suggestive of a new versus rapidly recurrent neoplasm in the bladder neck area.  He had  nephrostomy tubes placed.  His kidney function has partially recovered.  He presents for resection of the mass with a goal of tissue diagnosis and possibly unroofing his ureteral orifices.  Informed consent was signed and placed in the medical record.  PROCEDURE IN DETAIL:  The patient is being verified and the procedure being transection of bladder tumor , possible retrograde, possible stenting was confirmed.  Procedure timeout was performed.  Intravenous antibiotics administered.  General LMA  anesthesia induced.   The patient was placed into a low lithotomy position.  Sterile  field was created, prepped and draped the patient's penis, perineum, and proximal thighs using iodine and cystourethroscopy was performed using 21-French rigid  cystoscope with offset lens.  Inspection of the anterior and posterior urethra revealed a very large neoplasm contiguous with the bladder neck and prostatic urethra.  There is significant fibrinous debris in the bladder.  This was irrigated.  Ureteral  orifices were completely obliterated.  Cystoscope was exchanged with a 26-French resectoscope sheath with visual obturator.  Using resectoscope loop with bipolar energy very careful resection was performed in a lateral to medial fashion of the large  neoplasm down to the area of the proximal prostate and to a level that appeared to be just flush to the superficial fibromuscular of the true urinary bladder this neoplasm was very large.  Total resection area at least 7 cm.  This generated a significant  amount of tissue fragments that were irrigated and satisfied for permanent pathology.  This removed the intraluminal mass effect of the large neoplasm.  Despite aggressive resection, the area of the trigone the ureteral orifices remained completely  obliterated consistent with at least stage III disease.  The entire base of the fulguration area was then carefully cauterized.  Hemostasis was excellent.  Given the patient's bilateral nephrostomy tubes and essentially no urine per urethra, it was felt  that urethral catheterization would not be warranted and likely just exacerbate any bleeding and decision was made not to keep a catheter in.  As such, the bladder was emptied per cystoscope.  Procedure was then terminated.  The patient tolerated  procedure well.  No immediate complications.  The patient was taken to postanesthesia care in stable condition.  Plan to discharge the patient home.  SUJ D: 05/09/2021 1:03:53 pm T: 05/09/2021 11:16:00 pm  JOB: [id number]/ [id number]

## 2021-05-09 NOTE — Discharge Instructions (Addendum)
1 - You may have urinary urgency (bladder spasms) and bloody urethral discharge on / off. This is normal.  2 - Call MD or go to ER for fever >102, severe pain / nausea / vomiting not relieved by medications, or acute change in medical status.

## 2021-05-09 NOTE — Transfer of Care (Addendum)
Immediate Anesthesia Transfer of Care Note  Patient: Samuel [name]  Procedure(s) Performed: TRANSURETHRAL RESECTION OF BLADDER TUMOR (TURBT) (Bladder) CYSTOSCOPY (Bilateral: Bladder)  Patient Location: PACU  Anesthesia Type:General  Level of Consciousness: awake and patient cooperative  Airway & Oxygen Therapy: Patient Spontanous Breathing and Patient connected to face mask  Post-op Assessment: Report given to RN and Post -op Vital signs reviewed and stable  Post vital signs: Reviewed and stable  Last Vitals:  Vitals Value Taken Time  BP 115/71 05/09/21 1309  Temp    Pulse 83 05/09/21 1312  Resp    SpO2 99 % 05/09/21 1312  Vitals shown include unvalidated device data.  Last Pain:  Vitals:   05/09/21 1007  TempSrc:   PainSc: 0-No pain         Complications: No notable events documented.

## 2021-05-09 NOTE — H&P (Signed)
Samuel [name] is an 70 y.o. male.    Chief Complaint: Pre-Op Transurethral Resection of Bladder Tumor / Retrogrades  HPI:   1 - Lower Urinary Tract Symptoms / Urinary Urgency / Nocturia / Incomplete Emptying- Progressive bother from mix of irritative and obstructive symptoms with weak stream, post=void dribble and urinary urgency. Prostate Vol 90mL (NOT enlarged) by CT ellipsoid calculation 08/2020. PVR 05/2020 "347mL" (moderate elevatio). On tamsulosin at baseline. 50PY smoker. Restarting finasteride and double dose tamsulosin 05/2020.   2 - Prostate Screening - No FHX prostate cancer. PSA 3.1 and stable with 60gm smooth DRE 2022 at age 75 and major comorbities ==> STOP PSA based screening.   3 - Bladder Cancer -gross blood noted by wife late 2021 and x several. Prior 69 PY smoker. Non-con CT 08/2020 with few equivocal cystic areas. Operative cysto / retrogrades 09/2020 with carcinoma in situ.   Recent Course:  10/2020 == > BCG induction x 6; 02/2021 - cysto ? 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 New bilateral hydro to bladder neck mass 03/2021 and bilateral neph tubes placed.   5 - Bilateral Renal Cystic Masses - bilateral <2cm cortical cysts (some hyperdense) x on CG 08/2020. Dominant Rt mid 3cm and RLP 2cm. NO contrast imaging yet.   PMH sig for HTN, HLD, Anxiety/SNRI, COPD/Inhailers (nightly O2, not limiting), congenital Left arm defect. His PCP is Dorothyann Peng NP with [facility].   Today " Samuel [name] " is seen to proceed with TURBT for infiltrative bladder neck mass causing malignant obstructino. NO interval fevers.   Past Medical History:  Diagnosis Date   Anemia    Anxiety    Arthritis    Bladder cancer [facility])    urologist--- dr Tresa Moore   BPH with obstruction/lower urinary  tract symptoms    CKD (chronic kidney disease), stage III ([place])    Congenital deformity of left hand    COPD (chronic obstructive pulmonary disease) ([place])    followed by pcp   Depression    Dyspnea    Essential hypertension    followed by pcp   History of adenomatous polyp of colon    Hyperlipidemia    Renal cysts, acquired, bilateral    Wears glasses     Past Surgical History:  Procedure Laterality Date   COLONOSCOPY WITH PROPOFOL  last one 02-04-2017  dr Henrene Pastor   CYSTOSCOPY W/ RETROGRADES Bilateral 09/15/2020   Procedure: CYSTOSCOPY WITH RETROGRADE PYELOGRAM;  Surgeon: Alexis Frock, MD;  Location: [facility];  Service: Urology;  Laterality: Bilateral;   IR NEPHROSTOMY PLACEMENT LEFT  03/30/2021   IR NEPHROSTOMY PLACEMENT RIGHT  04/02/2021   TRANSURETHRAL RESECTION OF BLADDER TUMOR N/A 09/15/2020   Procedure: BLADDER BIOPSY FULGERATION;  Surgeon: Alexis Frock, MD;  Location: [facility];  Service: Urology;  Laterality: N/A;  62 MINS    Family History  Problem Relation Age of Onset   Hypertension Mother    Stroke Mother    Heart disease Father    Breast cancer Sister    Prostate cancer Brother    Hypertension Sister    Colon cancer Neg Hx    Esophageal cancer Neg Hx    Pancreatic cancer Neg Hx    Rectal cancer Neg Hx    Stomach cancer Neg Hx  Social History:  reports that he quit smoking about 4 years ago. His smoking use included cigarettes. He has never used smokeless tobacco. He reports that he does not drink alcohol and does not use drugs.  Allergies:  Allergies  Allergen Reactions   Penicillins Nausea And Vomiting    No medications prior to admission.    No results found for this or any previous visit (from the past 48 hour(s)). No results found.  Review of Systems  Constitutional:  Negative for chills and fever.  Genitourinary:  Positive for hematuria.  All other systems reviewed and are negative.  There were no  vitals taken for this visit. Physical Exam Vitals reviewed.  HENT:     Nose: Nose normal.  Eyes:     Pupils: Pupils are equal, round, and reactive to light.  Cardiovascular:     Rate and Rhythm: Normal rate.  Pulmonary:     Effort: Pulmonary effort is normal.  Abdominal:     General: Abdomen is flat.  Genitourinary:    Comments: Bilateral neph tubes in place with non-foul urine.  Skin:    General: Skin is warm.  Neurological:     Mental Status: He is alert.  Psychiatric:        Mood and Affect: Mood normal.     Assessment/Plan  Proceed as planned with cysto, bilateral retrogrades (if possible), TURBT for restaging. Risks, benefits, alternatives, expected peri-oip course discussed. He will likely need furhter therapy with chemo and cystectomy if expected DX confirmed.   Alexis Frock, MD 05/09/2021, 8:06 AM

## 2021-05-09 NOTE — Anesthesia Procedure Notes (Signed)
Procedure Name: LMA Insertion Date/Time: 05/09/2021 12:13 PM Performed by: Claudia Desanctis, CRNA Pre-anesthesia Checklist: Emergency Drugs available, Patient identified, Suction available and Patient being monitored Patient Re-evaluated:Patient Re-evaluated prior to induction Oxygen Delivery Method: Circle system utilized Preoxygenation: Pre-oxygenation with 100% oxygen Induction Type: IV induction Ventilation: Mask ventilation without difficulty LMA: LMA inserted LMA Size: 4.0 Number of attempts: 1 Placement Confirmation: positive ETCO2 and breath sounds checked- equal and bilateral Tube secured with: Tape Dental Injury: Teeth and Oropharynx as per pre-operative assessment  Comments: Very loose tooth noted upper left canine      tooth avoided with lma

## 2021-05-10 ENCOUNTER — Encounter (HOSPITAL_COMMUNITY): Payer: Self-pay | Admitting: Urology

## 2021-05-11 LAB — SURGICAL PATHOLOGY

## 2021-05-13 ENCOUNTER — Telehealth: Payer: Self-pay | Admitting: Internal Medicine

## 2021-05-13 NOTE — Telephone Encounter (Signed)
Patient is a patient of Dr. Antoine Primas.  He has scheduled pulmonary function testing on 05/16/2021.  Prior to that he was advised to go get COVID PCR test done.  He is from [facility] and showed up at the old [facility] but said he could not find the location.  Based on [facility] website it appears this is now being outsourced to [facility] labs.  I will give him the below instruction and information and instructions to find the white box building across approximately hotel.  Told him and his wife that if he still is not able to locate a facility then he should call back when the office opens on 05/15/2021   xxxxxxxxxxxxxxxxxxxxxxxxxxxxxxxxxxxxxxxxxxx  Pre-Admission Testing for Surgery, Medical Procedures, or Hospital Admission As of 12/11/2020 testing services will be done by a 3rd party contractor, [employer]. Patients with questions should contact the provider that scheduled their procedure. [employer] Location for Pre-Admission Testing:  [address], [place], [place]  [address]; 1st floor [address], Phone number: [phone].

## 2021-05-14 DIAGNOSIS — J9611 Chronic respiratory failure with hypoxia: Secondary | ICD-10-CM | POA: Diagnosis not present

## 2021-05-15 ENCOUNTER — Other Ambulatory Visit: Payer: Self-pay | Admitting: Internal Medicine

## 2021-05-15 LAB — SARS CORONAVIRUS 2 (TAT 6-24 HRS): SARS Coronavirus 2: NEGATIVE

## 2021-05-15 NOTE — Telephone Encounter (Signed)
Covid test completed and this was neg.

## 2021-05-16 ENCOUNTER — Ambulatory Visit (INDEPENDENT_AMBULATORY_CARE_PROVIDER_SITE_OTHER): Payer: Medicare Other | Admitting: Pulmonary Disease

## 2021-05-16 ENCOUNTER — Other Ambulatory Visit: Payer: Self-pay

## 2021-05-16 DIAGNOSIS — J432 Centrilobular emphysema: Secondary | ICD-10-CM

## 2021-05-16 LAB — PULMONARY FUNCTION TEST
DL/VA % pred: 47 %
DL/VA: 1.96 ml/min/mmHg/L
DLCO cor % pred: 38 %
DLCO cor: 8.22 ml/min/mmHg
DLCO unc % pred: 38 %
DLCO unc: 8.22 ml/min/mmHg
FEF 25-75 Post: 0.53 L/sec
FEF 25-75 Pre: 0.46 L/sec
FEF2575-%Change-Post: 15 %
FEF2575-%Pred-Post: 28 %
FEF2575-%Pred-Pre: 24 %
FEV1-%Change-Post: 8 %
FEV1-%Pred-Post: 48 %
FEV1-%Pred-Pre: 44 %
FEV1-Post: 1.21 L
FEV1-Pre: 1.12 L
FEV1FVC-%Change-Post: 2 %
FEV1FVC-%Pred-Pre: 70 %
FEV6-%Change-Post: 4 %
FEV6-%Pred-Post: 68 %
FEV6-%Pred-Pre: 65 %
FEV6-Post: 2.2 L
FEV6-Pre: 2.1 L
FEV6FVC-%Change-Post: 0 %
FEV6FVC-%Pred-Post: 103 %
FEV6FVC-%Pred-Pre: 104 %
FVC-%Change-Post: 5 %
FVC-%Pred-Post: 66 %
FVC-%Pred-Pre: 62 %
FVC-Post: 2.26 L
FVC-Pre: 2.15 L
Post FEV1/FVC ratio: 53 %
Post FEV6/FVC ratio: 97 %
Pre FEV1/FVC ratio: 52 %
Pre FEV6/FVC Ratio: 98 %
RV % pred: 142 %
RV: 3.05 L
TLC % pred: 95 %
TLC: 5.55 L

## 2021-05-16 NOTE — Progress Notes (Signed)
PFT done today. 

## 2021-05-17 ENCOUNTER — Telehealth: Payer: Self-pay | Admitting: Pulmonary Disease

## 2021-05-17 NOTE — Telephone Encounter (Signed)
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 This is consistent with his symptoms and imaging changes. He is on the appropriate treatment regimen with Breztri. Continue this and follow up with Dr. Erin Fulling as scheduled or PRN. Thanks!    Patient is aware of results and voiced his understanding.  Nothing further needed at this time.

## 2021-05-17 NOTE — Progress Notes (Signed)
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 This is consistent with his symptoms and imaging changes. He is on the appropriate treatment regimen with Breztri. Continue this and follow up with Dr. Erin Fulling as scheduled or PRN. Thanks!

## 2021-05-22 DIAGNOSIS — C775 Secondary and unspecified malignant neoplasm of intrapelvic lymph nodes: Secondary | ICD-10-CM | POA: Diagnosis not present

## 2021-05-22 DIAGNOSIS — C678 Malignant neoplasm of overlapping sites of bladder: Secondary | ICD-10-CM | POA: Diagnosis not present

## 2021-05-22 DIAGNOSIS — N130 Hydronephrosis with ureteropelvic junction obstruction: Secondary | ICD-10-CM | POA: Diagnosis not present

## 2021-05-24 ENCOUNTER — Telehealth: Payer: Self-pay | Admitting: Oncology

## 2021-05-24 NOTE — Telephone Encounter (Signed)
Scheduled appt per 1/12 referral. Pt is aware of appt date and time. Pt is aware to arrive 15 mins early to app time.

## 2021-06-07 ENCOUNTER — Other Ambulatory Visit: Payer: Self-pay

## 2021-06-07 ENCOUNTER — Inpatient Hospital Stay: Payer: Medicare Other | Attending: Oncology | Admitting: Oncology

## 2021-06-07 VITALS — BP 133/70 | HR 92 | Temp 97.5°F | Resp 20 | Wt 148.0 lb

## 2021-06-07 DIAGNOSIS — I129 Hypertensive chronic kidney disease with stage 1 through stage 4 chronic kidney disease, or unspecified chronic kidney disease: Secondary | ICD-10-CM | POA: Diagnosis not present

## 2021-06-07 DIAGNOSIS — E785 Hyperlipidemia, unspecified: Secondary | ICD-10-CM | POA: Insufficient documentation

## 2021-06-07 DIAGNOSIS — R590 Localized enlarged lymph nodes: Secondary | ICD-10-CM | POA: Diagnosis not present

## 2021-06-07 DIAGNOSIS — C679 Malignant neoplasm of bladder, unspecified: Secondary | ICD-10-CM | POA: Insufficient documentation

## 2021-06-07 DIAGNOSIS — J449 Chronic obstructive pulmonary disease, unspecified: Secondary | ICD-10-CM | POA: Diagnosis not present

## 2021-06-07 DIAGNOSIS — N183 Chronic kidney disease, stage 3 unspecified: Secondary | ICD-10-CM | POA: Diagnosis not present

## 2021-06-07 DIAGNOSIS — Z79899 Other long term (current) drug therapy: Secondary | ICD-10-CM | POA: Insufficient documentation

## 2021-06-07 MED ORDER — PROCHLORPERAZINE MALEATE 10 MG PO TABS: 10.0000 mg | ORAL_TABLET | Freq: Four times a day (QID) | ORAL | 0 refills | Status: DC | PRN

## 2021-06-07 MED ORDER — LIDOCAINE-PRILOCAINE 2.5-2.5 % EX CREA: 1.0000 "application " | TOPICAL_CREAM | CUTANEOUS | 0 refills | Status: AC | PRN

## 2021-06-07 NOTE — Progress Notes (Signed)
Reason for the request:   Bladder cancer  HPI: I was asked by Dr. Tresa Moore to evaluate Samuel [name] for the evaluation of bladder cancer.  He is a 71 year old man with bladder cancer diagnosed in June 2022.  He was found to have localized without any muscle invasion.  He was treated with BCG induction at that time under the care of Dr. Tresa Moore.  He underwent repeat cystoscopy in June 2022 which showed a post BCG inflammation.  CT scan in November 2022 showed a bladder neck infiltrative mass with bilateral hydronephrosis and pelvic adenopathy.  He underwent bilateral nephrostomy tube placement in November 2022.  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  Tumor invades into muscularis propria.  Imaging studies including a CT scan of the abdomen and pelvis completed on March 30, 2021 showed large bladder obstructing the distal ureters measuring 2.7 x 5 x 6.5 cm associated with pelvic and retroperitoneal adenopathy.  Clinically, he reports feeling reasonably fair without any issues.  He denies any nausea, vomiting or abdominal pain.  He denies any pelvic pain or discomfort.  He continues to have persistent hematuria.  He does not report any headaches, blurry vision, syncope or seizures. Does not report any fevers, chills or sweats.  Does not report any cough, wheezing or hemoptysis.  Does not report any chest pain, palpitation, orthopnea or leg edema.  Does not report any nausea, vomiting or abdominal pain.  Does not report any constipation or diarrhea.  Does not report any skeletal complaints.    Does not report frequency, urgency or hematuria.  Does not report any skin rashes or lesions. Does not report any heat or cold intolerance.  Does not report any lymphadenopathy or petechiae.  Does not report any anxiety or depression.  Remaining review of systems is negative.     Past  Medical History:  Diagnosis Date   Anemia    Anxiety    Arthritis    Bladder cancer [facility])    urologist--- dr Tresa Moore   BPH with obstruction/lower urinary tract symptoms    CKD (chronic kidney disease), stage III ([place])    Congenital deformity of left hand    COPD (chronic obstructive pulmonary disease) ([place])    followed by pcp   Depression    Dyspnea    Essential hypertension    followed by pcp   History of adenomatous polyp of colon    Hyperlipidemia    Renal cysts, acquired, bilateral    Wears glasses   :   Past Surgical History:  Procedure Laterality Date   COLONOSCOPY WITH PROPOFOL  last one 02-04-2017  dr Henrene Pastor   CYSTOSCOPY W/ RETROGRADES Bilateral 09/15/2020   Procedure: CYSTOSCOPY WITH RETROGRADE PYELOGRAM;  Surgeon: Alexis Frock, MD;  Location: [facility];  Service: Urology;  Laterality: Bilateral;   CYSTOSCOPY W/ RETROGRADES Bilateral 05/09/2021   Procedure: CYSTOSCOPY;  Surgeon: Alexis Frock, MD;  [facility];  Service: Urology;  Laterality: Bilateral;   IR NEPHROSTOMY PLACEMENT LEFT  03/30/2021   IR NEPHROSTOMY PLACEMENT RIGHT  04/02/2021   TRANSURETHRAL RESECTION OF BLADDER TUMOR N/A 09/15/2020   Procedure: BLADDER BIOPSY FULGERATION;  Surgeon: Alexis Frock, MD;  Location: [facility];  Service: Urology;  Laterality: N/A;  [place] TUMOR N/A 05/09/2021   Procedure: TRANSURETHRAL RESECTION OF BLADDER TUMOR (TURBT);  Surgeon: Alexis Frock, MD;  [facility];  Service: Urology;  Laterality: N/A;  75 MINS  :   Current Outpatient Medications:    acetaminophen (TYLENOL) 325 MG tablet, Take 650 mg by mouth every 6 (six) hours as needed for moderate pain., Disp: , Rfl:    albuterol (PROVENTIL) (2.5 MG/3ML) 0.083% nebulizer solution, Take 2.5 mg by nebulization in the morning and at bedtime., Disp: , Rfl:    albuterol (VENTOLIN HFA) 108 (90 Base) MCG/ACT inhaler, Inhale 2 puffs into the  lungs every 6 (six) hours as needed for wheezing or shortness of breath., Disp: 8 g, Rfl: 6   amLODipine (NORVASC) 10 MG tablet, Take 10 mg by mouth at bedtime., Disp: , Rfl:    aspirin EC 81 MG tablet, Take 1 tablet (81 mg total) by mouth daily. Swallow whole., Disp: 90 tablet, Rfl: 3   atorvastatin (LIPITOR) 40 MG tablet, Take 1 tablet (40 mg total) by mouth daily., Disp: 90 tablet, Rfl: 3   Budeson-Glycopyrrol-Formoterol (BREZTRI AEROSPHERE) 160-9-4.8 MCG/ACT AERO, Inhale 2 puffs into the lungs in the morning and at bedtime., Disp: 11.8 g, Rfl: 11   Cholecalciferol (VITAMIN D3 PO), Take 1 tablet by mouth daily., Disp: , Rfl:    citalopram (CELEXA) 20 MG tablet, Take 1 tablet (20 mg total) by mouth daily. (Patient taking differently: Take 20 mg by mouth at bedtime.), Disp: 90 tablet, Rfl: 1   Cyanocobalamin (B-12 PO), Take 1 capsule by mouth daily., Disp: , Rfl:    finasteride (PROSCAR) 5 MG tablet, Take 5 mg by mouth daily., Disp: , Rfl:    metoprolol succinate (TOPROL-XL) 25 MG 24 hr tablet, Take 25 mg by mouth daily., Disp: , Rfl:    oxyCODONE (OXY IR/ROXICODONE) 5 MG immediate release tablet, Take 1 tablet (5 mg total) by mouth every 6 (six) hours as needed for moderate pain or severe pain (Post-operatively)., Disp: 25 tablet, Rfl: 0   OXYGEN, Inhale 2 L into the lungs See admin instructions. 2L when taking a nap and at bedtime, Disp: , Rfl:    tamsulosin (FLOMAX) 0.4 MG CAPS capsule, Take 2 capsules by mouth at bedtime., Disp: , Rfl: :   Allergies  Allergen Reactions   Penicillins Nausea And Vomiting  :   Family History  Problem Relation Age of Onset   Hypertension Mother    Stroke Mother    Heart disease Father    Breast cancer Sister    Prostate cancer Brother    Hypertension Sister    Colon cancer Neg Hx    Esophageal cancer Neg Hx    Pancreatic cancer Neg Hx    Rectal cancer Neg Hx    Stomach cancer Neg Hx   :   Social History   Socioeconomic History   Marital  status: Married    Spouse name: Not on file   Number of children: Not on file   Years of education: Not on file   Highest education level: Not on file  Occupational History   Occupation: retired  Tobacco Use   Smoking status: Former    Years: 58.00    Types: Cigarettes    Quit date: 01/16/2017    Years since quitting: 4.3   Smokeless tobacco: Never  Vaping Use   Vaping Use: Never used  Substance and Sexual Activity   Alcohol use: No   Drug use: Never   Sexual activity: Not on file  Other Topics Concern   Not on file  Social History Narrative   Retired    Married  He watches television   Social Determinants of Health   Financial Resource Strain: Medium Risk   Difficulty of Paying Living Expenses: Somewhat hard  Food Insecurity: No Food Insecurity   Worried About Charity fundraiser in the Last Year: Never true   Ran Out of Food in the Last Year: Never true  Transportation Needs: No Transportation Needs   Lack of Transportation (Medical): No   Lack of Transportation (Non-Medical): No  Physical Activity: Inactive   Days of Exercise per Week: 0 days   Minutes of Exercise per Session: 0 min  Stress: No Stress Concern Present   Feeling of Stress : Not at all  Social Connections: Moderately Isolated   Frequency of Communication with Friends and Family: More than three times a week   Frequency of Social Gatherings with Friends and Family: More than three times a week   Attends Religious Services: Never   Marine scientist or Organizations: No   Attends Music therapist: Never   Marital Status: Married  Human resources officer Violence: Not At Risk   Fear of Current or Ex-Partner: No   Emotionally Abused: No   Physically Abused: No   Sexually Abused: No  :  Pertinent items are noted in HPI.  Exam: Blood pressure 133/70, pulse 92, temperature (!) 97.5 F (36.4 C), resp. rate 20, weight 148 lb (67.1 kg), SpO2 95 %. ECOG 1 General appearance: alert and  cooperative appeared without distress. Head: atraumatic without any abnormalities. Eyes: conjunctivae/corneas clear. PERRL.  Sclera anicteric. Throat: lips, mucosa, and tongue normal; without oral thrush or ulcers. Resp: clear to auscultation bilaterally without rhonchi, wheezes or dullness to percussion. Cardio: regular rate and rhythm, S1, S2 normal, no murmur, click, rub or gallop GI: soft, non-tender; bowel sounds normal; no masses,  no organomegaly Skin: Skin color, texture, turgor normal. No rashes or lesions Lymph nodes: Cervical, supraclavicular, and axillary nodes normal. Neurologic: Grossly normal without any motor, sensory or deep tendon reflexes. Musculoskeletal: No joint deformity or effusion.    Assessment and Plan:   71 year old with:  1.  Bladder cancer presented with large bladder mass obstructing the distal ureters with high-grade tumor and small cell component.  This was diagnosed in December 2022.  He has evidence of metastatic disease with pelvic adenopathy.  Treatment options at this time including systemic chemotherapy versus immunotherapy were reviewed.  Any treatment is palliative at this time given his diseases is incurable.  I believe chemotherapy may be a better option for him given his high-volume disease and potentially poorly differentiated tumor that might not respond to immunotherapy appropriately.  I also recommended obtaining PET scan for staging purposes.   The logistics and rationale for using chemotherapy was reviewed today in detail.  Complication associated with carboplatin and gemcitabine chemotherapy was discussed.  These complications include nausea, vomiting, myelosuppression, fatigue, infusion related complications, renal insufficiency, neutropenia, neutropenic sepsis and rarely serious thrombosis, hospitalization and death.  The benefit would also if he has an excellent response to chemotherapy, curative surgical resection may be attempted.  The  plan is to treat with gemcitabine and cisplatin on day 1, gemcitabine day 8 out of a 21-day cycle.  Anticipate needing 4-6 cycles of therapy    After discussion today, he is agreeable to proceed after chemo education class.     2.  IV access: Risks and benefits of using Port-A-Cath versus peripheral veins was discussed today.  Complication associated with Port-A-Cath insertion include bleeding, infection and thrombosis.  After discussing the risks and benefits, he is agreeable to proceed.   3.  Antiemetics: Prescription for Compazine was made available to him.   4.  Renal function surveillance: His baseline kidney function is poor and will adjust carboplatin dosing accordingly.  He has bilateral nephrostomy tube in place.   5.  Goals of care: Therapy is palliative although aggressive measures are warranted at this time.   6.  Follow-up: will be in the immediate future to start chemotherapy.  60  minutes were dedicated to this visit. The time was spent on reviewing laboratory data, imaging studies, discussing treatment options,  and answering questions regarding future plan.      A copy of this consult has been forwarded to the requesting physician.

## 2021-06-07 NOTE — Progress Notes (Signed)
START ON PATHWAY REGIMEN - Bladder     A cycle is every 21 days:     Carboplatin      Gemcitabine   **Always confirm dose/schedule in your pharmacy ordering system**  Patient Characteristics: Advanced/Metastatic Disease, First Line, No Prior Platinum-Based Therapy, Poor Renal Function (CrCl < 50 mL/min), Unknown PD-L1 Expression Therapeutic Status: Advanced/Metastatic Disease Line of Therapy: First Line Prior Platinum-Based Therapy<= No Renal Function: Poor Renal Function (CrCl < 50 mL/min) PD-L1 Expression Status: Unknown PD-L1 Expression Intent of Therapy: Non-Curative / Palliative Intent, Discussed with Patient 

## 2021-06-08 ENCOUNTER — Encounter: Payer: Self-pay | Admitting: Adult Health

## 2021-06-08 ENCOUNTER — Telehealth: Payer: Self-pay | Admitting: Oncology

## 2021-06-08 NOTE — Telephone Encounter (Signed)
Scheduled per los, patient has been called and notified of upcoming appointments. 

## 2021-06-11 ENCOUNTER — Other Ambulatory Visit: Payer: Self-pay | Admitting: Oncology

## 2021-06-11 NOTE — Telephone Encounter (Signed)
FYI

## 2021-06-13 ENCOUNTER — Other Ambulatory Visit: Payer: Self-pay

## 2021-06-13 ENCOUNTER — Inpatient Hospital Stay: Payer: Medicare Other | Attending: Oncology

## 2021-06-13 ENCOUNTER — Inpatient Hospital Stay: Payer: Medicare Other

## 2021-06-13 ENCOUNTER — Other Ambulatory Visit: Payer: Medicare Other

## 2021-06-13 DIAGNOSIS — C679 Malignant neoplasm of bladder, unspecified: Secondary | ICD-10-CM

## 2021-06-13 DIAGNOSIS — Z5111 Encounter for antineoplastic chemotherapy: Secondary | ICD-10-CM | POA: Insufficient documentation

## 2021-06-13 DIAGNOSIS — Z79899 Other long term (current) drug therapy: Secondary | ICD-10-CM | POA: Diagnosis not present

## 2021-06-13 LAB — CBC WITH DIFFERENTIAL (CANCER CENTER ONLY)
Abs Immature Granulocytes: 0.05 10*3/uL (ref 0.00–0.07)
Basophils Absolute: 0.1 10*3/uL (ref 0.0–0.1)
Basophils Relative: 1 %
Eosinophils Absolute: 0.2 10*3/uL (ref 0.0–0.5)
Eosinophils Relative: 2 %
HCT: 28.5 % — ABNORMAL LOW (ref 39.0–52.0)
Hemoglobin: 9 g/dL — ABNORMAL LOW (ref 13.0–17.0)
Immature Granulocytes: 1 %
Lymphocytes Relative: 9 %
Lymphs Abs: 1 10*3/uL (ref 0.7–4.0)
MCH: 27.2 pg (ref 26.0–34.0)
MCHC: 31.6 g/dL (ref 30.0–36.0)
MCV: 86.1 fL (ref 80.0–100.0)
Monocytes Absolute: 0.9 10*3/uL (ref 0.1–1.0)
Monocytes Relative: 8 %
Neutro Abs: 8.9 10*3/uL — ABNORMAL HIGH (ref 1.7–7.7)
Neutrophils Relative %: 79 %
Platelet Count: 517 10*3/uL — ABNORMAL HIGH (ref 150–400)
RBC: 3.31 MIL/uL — ABNORMAL LOW (ref 4.22–5.81)
RDW: 16 % — ABNORMAL HIGH (ref 11.5–15.5)
WBC Count: 11.1 10*3/uL — ABNORMAL HIGH (ref 4.0–10.5)
nRBC: 0 % (ref 0.0–0.2)

## 2021-06-13 LAB — CMP (CANCER CENTER ONLY)
ALT: 12 U/L (ref 0–44)
AST: 12 U/L — ABNORMAL LOW (ref 15–41)
Albumin: 3.6 g/dL (ref 3.5–5.0)
Alkaline Phosphatase: 98 U/L (ref 38–126)
Anion gap: 9 (ref 5–15)
BUN: 38 mg/dL — ABNORMAL HIGH (ref 8–23)
CO2: 24 mmol/L (ref 22–32)
Calcium: 9 mg/dL (ref 8.9–10.3)
Chloride: 104 mmol/L (ref 98–111)
Creatinine: 2.17 mg/dL — ABNORMAL HIGH (ref 0.61–1.24)
GFR, Estimated: 32 mL/min — ABNORMAL LOW (ref >60)
Glucose, Bld: 130 mg/dL — ABNORMAL HIGH (ref 70–99)
Potassium: 3.8 mmol/L (ref 3.5–5.1)
Sodium: 137 mmol/L (ref 135–145)
Total Bilirubin: 0.3 mg/dL (ref 0.3–1.2)
Total Protein: 7.3 g/dL (ref 6.5–8.1)

## 2021-06-13 NOTE — Progress Notes (Signed)
Pharmacist Chemotherapy Monitoring - Initial Assessment    Anticipated start date: 06/20/21   The following has been reviewed per standard work regarding the patient's treatment regimen: The patient's diagnosis, treatment plan and drug doses, and organ/hematologic function Lab orders and baseline tests specific to treatment regimen  The treatment plan start date, drug sequencing, and pre-medications Prior authorization status  Patient's documented medication list, including drug-drug interaction screen and prescriptions for anti-emetics and supportive care specific to the treatment regimen The drug concentrations, fluid compatibility, administration routes, and timing of the medications to be used The patient's access for treatment and lifetime cumulative dose history, if applicable  The patient's medication allergies and previous infusion related reactions, if applicable   Changes made to treatment plan:  N/A  Follow up needed:  Pending authorization for treatment    Kennith Center, Pharm.D., CPP 06/13/2021@3 :41 PM

## 2021-06-14 ENCOUNTER — Other Ambulatory Visit (HOSPITAL_COMMUNITY): Payer: Self-pay | Admitting: Physician Assistant

## 2021-06-14 DIAGNOSIS — J9611 Chronic respiratory failure with hypoxia: Secondary | ICD-10-CM | POA: Diagnosis not present

## 2021-06-15 ENCOUNTER — Other Ambulatory Visit: Payer: Self-pay

## 2021-06-15 ENCOUNTER — Other Ambulatory Visit: Payer: Self-pay | Admitting: Radiology

## 2021-06-15 ENCOUNTER — Ambulatory Visit (HOSPITAL_COMMUNITY)
Admission: RE | Admit: 2021-06-15 | Discharge: 2021-06-15 | Disposition: A | Discharge status: Home / Self Care | Payer: Medicare Other | Source: Ambulatory Visit | Attending: Oncology | Admitting: Oncology

## 2021-06-15 DIAGNOSIS — I2510 Atherosclerotic heart disease of native coronary artery without angina pectoris: Secondary | ICD-10-CM | POA: Diagnosis not present

## 2021-06-15 DIAGNOSIS — N3289 Other specified disorders of bladder: Secondary | ICD-10-CM | POA: Diagnosis not present

## 2021-06-15 DIAGNOSIS — C679 Malignant neoplasm of bladder, unspecified: Secondary | ICD-10-CM | POA: Diagnosis not present

## 2021-06-15 DIAGNOSIS — D171 Benign lipomatous neoplasm of skin and subcutaneous tissue of trunk: Secondary | ICD-10-CM | POA: Diagnosis not present

## 2021-06-15 DIAGNOSIS — K449 Diaphragmatic hernia without obstruction or gangrene: Secondary | ICD-10-CM | POA: Diagnosis not present

## 2021-06-15 LAB — GLUCOSE, CAPILLARY: Glucose-Capillary: 102 mg/dL — ABNORMAL HIGH (ref 70–99)

## 2021-06-15 MED ORDER — FLUDEOXYGLUCOSE F - 18 (FDG) INJECTION
7.3000 | Freq: Once | INTRAVENOUS | Status: AC | PRN
  Administered 2021-06-15: 7.4 via INTRAVENOUS

## 2021-06-15 NOTE — H&P (Signed)
Chief Complaint: Patient was seen in consultation today for port-a-catheter placement.   Referring Physician(s): Wyatt Portela  Supervising Physician: Daryll Brod  Patient Status: [facility] - Out-pt  History of Present Illness: Samuel [name] is a 71 y.o. male with a medical history significant for HTN, anxiety/depression, COPD, congenital deformity of the left hand, CKD stage III and bladder cancer diagnosed June 2022 s/p BCG induction and TURBT. Later imaging showed a bladder neck infiltrative mass with bilateral hydronephrosis and IR placed bilateral nephrostomy tubes 03/2021. He subsequently underwent repeat TURBT 05/09/21. His oncology team is preparing him for palliative chemotherapy.    Interventional Radiology has been asked to evaluate this patient for an image-guided port-a-catheter placement.   Past Medical History:  Diagnosis Date   Anemia    Anxiety    Arthritis    Bladder cancer [facility])    urologist--- dr Tresa Moore   BPH with obstruction/lower urinary tract symptoms    CKD (chronic kidney disease), stage III ([place])    Congenital deformity of left hand    COPD (chronic obstructive pulmonary disease) ([place])    followed by pcp   Depression    Dyspnea    Essential hypertension    followed by pcp   History of adenomatous polyp of colon    Hyperlipidemia    Renal cysts, acquired, bilateral    Wears glasses     Past Surgical History:  Procedure Laterality Date   COLONOSCOPY WITH PROPOFOL  last one 02-04-2017  dr Henrene Pastor   CYSTOSCOPY W/ RETROGRADES Bilateral 09/15/2020   Procedure: CYSTOSCOPY WITH RETROGRADE PYELOGRAM;  Surgeon: Alexis Frock, MD;  Location: [facility];  Service: Urology;  Laterality: Bilateral;   CYSTOSCOPY W/ RETROGRADES Bilateral 05/09/2021   Procedure: CYSTOSCOPY;  Surgeon: Alexis Frock, MD;  Location: [facility];  Service: Urology;  Laterality: Bilateral;   IR NEPHROSTOMY PLACEMENT LEFT  03/30/2021   IR NEPHROSTOMY  PLACEMENT RIGHT  04/02/2021   TRANSURETHRAL RESECTION OF BLADDER TUMOR N/A 09/15/2020   Procedure: BLADDER BIOPSY FULGERATION;  Surgeon: Alexis Frock, MD;  Location: [facility];  Service: Urology;  Laterality: N/A;  [place] TUMOR N/A 05/09/2021   Procedure: TRANSURETHRAL RESECTION OF BLADDER TUMOR (TURBT);  Surgeon: Alexis Frock, MD;  Location: [facility];  Service: Urology;  Laterality: N/A;  62 MINS    Allergies: Penicillins  Medications: Prior to Admission medications   Medication Sig Start Date End Date Taking? Authorizing Provider  acetaminophen (TYLENOL) 325 MG tablet Take 650 mg by mouth every 6 (six) hours as needed for moderate pain.    [provider]  albuterol (PROVENTIL) (2.5 MG/3ML) 0.083% nebulizer solution Take 2.5 mg by nebulization in the morning and at bedtime.    [provider]  albuterol (VENTOLIN HFA) 108 (90 Base) MCG/ACT inhaler Inhale 2 puffs into the lungs every 6 (six) hours as needed for wheezing or shortness of breath. 11/08/20   Freddi Starr, MD  amLODipine (NORVASC) 10 MG tablet Take 10 mg by mouth at bedtime. 06/05/20   [provider]  aspirin EC 81 MG tablet Take 1 tablet (81 mg total) by mouth daily. Swallow whole. 04/17/21   Early Osmond, MD  atorvastatin (LIPITOR) 40 MG tablet Take 1 tablet (40 mg total) by mouth daily. 04/10/21   [name], [name], NP  Budeson-Glycopyrrol-Formoterol (BREZTRI AEROSPHERE) 160-9-4.8 MCG/ACT AERO Inhale 2 puffs into the lungs in the morning and at bedtime. 03/27/21   Dorothyann Peng, NP  Cholecalciferol (VITAMIN D3 PO) Take 1 tablet by mouth daily.    [provider]  citalopram (CELEXA) 20 MG tablet Take 1 tablet (20 mg total) by mouth daily. Patient taking differently: Take 20 mg by mouth at bedtime. 03/27/21   [name], [name], NP  Cyanocobalamin (B-12 PO) Take 1 capsule by mouth daily.    [provider]  finasteride  (PROSCAR) 5 MG tablet Take 5 mg by mouth daily. 05/15/20   Alexis Frock, MD  lidocaine-prilocaine (EMLA) cream Apply 1 application topically as needed. 06/07/21   Wyatt Portela, MD  metoprolol succinate (TOPROL-XL) 25 MG 24 hr tablet Take 25 mg by mouth daily.    [provider]  oxyCODONE (OXY IR/ROXICODONE) 5 MG immediate release tablet Take 1 tablet (5 mg total) by mouth every 6 (six) hours as needed for moderate pain or severe pain (Post-operatively). 05/09/21   Alexis Frock, MD  OXYGEN Inhale 2 L into the lungs See admin instructions. 2L when taking a nap and at bedtime    [provider]  prochlorperazine (COMPAZINE) 10 MG tablet TAKE 1 TABLET(10 MG) BY MOUTH EVERY 6 HOURS AS NEEDED FOR NAUSEA OR VOMITING 06/11/21   Wyatt Portela, MD  tamsulosin (FLOMAX) 0.4 MG CAPS capsule Take 2 capsules by mouth at bedtime. 04/21/20   [provider]     Family History  Problem Relation Age of Onset   Hypertension Mother    Stroke Mother    Heart disease Father    Breast cancer Sister    Prostate cancer Brother    Hypertension Sister    Colon cancer Neg Hx    Esophageal cancer Neg Hx    Pancreatic cancer Neg Hx    Rectal cancer Neg Hx    Stomach cancer Neg Hx     Social History   Socioeconomic History   Marital status: Married    Spouse name: Not on file   Number of children: Not on file   Years of education: Not on file   Highest education level: Not on file  Occupational History   Occupation: retired  Tobacco Use   Smoking status: Former    Years: 58.00    Types: Cigarettes    Quit date: 01/16/2017    Years since quitting: 4.4   Smokeless tobacco: Never  Vaping Use   Vaping Use: Never used  Substance and Sexual Activity   Alcohol use: No   Drug use: Never   Sexual activity: Not on file  Other Topics Concern   Not on file  Social History Narrative   Retired    Married       He watches television   Social Determinants of Health    Financial Resource Strain: Medium Risk   Difficulty of Paying Living Expenses: Somewhat hard  Food Insecurity: No Food Insecurity   Worried About Charity fundraiser in the Last Year: Never true   Arboriculturist in the Last Year: Never true  Transportation Needs: No Transportation Needs   Lack of Transportation (Medical): No   Lack of Transportation (Non-Medical): No  Physical Activity: Inactive   Days of Exercise per Week: 0 days   Minutes of Exercise per Session: 0 min  Stress: No Stress Concern Present   Feeling of Stress : Not at all  Social Connections: Moderately Isolated   Frequency of Communication with Friends and Family: More than three times a week   Frequency of Social Gatherings with Friends and Family:  More than three times a week   Attends Religious Services: Never   Active Member of Clubs or Organizations: No   Attends Archivist Meetings: Never   Marital Status: Married    Review of Systems: A 12 point ROS discussed and pertinent positives are indicated in the HPI above.  All other systems are negative.  Review of Systems  Constitutional:  Negative for appetite change and fatigue.  Respiratory:  Negative for cough and shortness of breath.   Cardiovascular:  Negative for chest pain and leg swelling.  Gastrointestinal:  Negative for diarrhea, nausea and vomiting.  Genitourinary:  Negative for flank pain.  Neurological:  Negative for headaches.   Vital Signs: 98.4  BMP 133/67, HR 89, RR 20 96% on room air   Physical Exam Constitutional:      General: He is not in acute distress.    Appearance: He is not ill-appearing.  HENT:     Mouth/Throat:     Mouth: Mucous membranes are moist.     Pharynx: Oropharynx is clear.  Cardiovascular:     Rate and Rhythm: Normal rate and regular rhythm.  Pulmonary:     Effort: Pulmonary effort is normal.     Breath sounds: Normal breath sounds.  Abdominal:     General: Bowel sounds are normal.     Palpations:  Abdomen is soft.  Genitourinary:    Comments: Bilateral nephrostomy tubes. Sutures and stat locks in place Musculoskeletal:        General: Deformity present.     Comments: Left hand - congenital   Skin:    General: Skin is warm and dry.  Neurological:     Mental Status: He is alert and oriented to person, place, and time.    Imaging: NM PET Image Initial (PI) Skull Base To Thigh  Result Date: 06/18/2021 CLINICAL DATA:  Subsequent treatment strategy for bladder malignancy. EXAM: NUCLEAR MEDICINE PET SKULL BASE TO THIGH TECHNIQUE: 7.4 mCi F-18 FDG was injected intravenously. Full-ring PET imaging was performed from the skull base to thigh after the radiotracer. CT data was obtained and used for attenuation correction and anatomic localization. Fasting blood glucose: 102 mg/dl COMPARISON:  CT scan 03/30/2021 FINDINGS: Mediastinal blood pool activity: SUV max 2.4 Liver activity: SUV max NA NECK: Physiologic muscular activity in the neck. Incidental CT findings: Mild atherosclerotic calcification of both common carotid arteries. CHEST: The lungs appear clear. Cardiac and mediastinal contours normal. No pleural effusion identified. 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 Appearance compatible with [place] syndrome. Coronary, aortic arch, and branch vessel atherosclerotic vascular disease. Small type 1 hiatal hernia. Prominent centrilobular emphysema.  Mild lingular scarring. ABDOMEN/PELVIS: Extensive hypermetabolic tumor in the urinary bladder. Representative SUV in the vicinity of bladder tumor with maximum SUV of 17.4. This is mostly posterior but also extends around superiorly and inferiorly in the urinary bladder and is also present in the vicinity of the ureterovesical junctions. There is hypermetabolic retrocrural, retrocaval, periaortic common iliac, bilateral external iliac, left internal iliac  adenopathy. A left common iliac node measuring 2.3 cm in short axis on image 149 of series 4 (formerly 0.5 cm) has a maximum SUV of 8.9. A left pelvic sidewall lymph node measuring 1.6 cm in short axis on image 64 series 3 (formerly 1.1 cm) has a maximum SUV of 11.4. A fusiform 1.4 by 1.1 cm mass along the right sciatic nerve on image  190 of series 4 a maximum SUV of 3.7, and is not appreciably changed from 03/30/2021. 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 Along the sigmoid colon as shown on image 56 of series 605, there is a focus of abnormal hypermetabolic activity with maximum SUV 11.3. This seems somewhat high to be inflammatory although there may be some mild inflammatory stranding along a diverticulum in this region. An underlying polyp would be a differential diagnostic consideration. Accentuated activity along the obturator internus muscles, especially on the left, likely physiologic given the lack of CT abnormality. Incidental CT findings: Bilateral nephrostomies noted. Bilateral hypodense renal lesions are probably cysts. Atherosclerosis is present, including aortoiliac atherosclerotic disease. Left gluteus medius lipoma. Cutaneous ulceration or lesion along the left back at about the L1 level on image 114 series 4 common no change from 09/27/2020, without substantial hypermetabolic activity. SKELETON: Focal mild hypermetabolic activity posteriorly in the T12 vertebral body associated with mild focal sclerosis measuring about 0.8 by 0.6 cm on image 107 series 4. This has a maximum SUV of 6.2. Mild focal activity in the right lateral fourth rib without underlying CT abnormality, as shown on image 152 series 605, maximum SUV 3.8. Incidental CT findings: Costosternal deformities related to [place] syndrome on the left. IMPRESSION: 1. Large hypermetabolic bladder mass associated with pelvic and upper abdominal hypermetabolic adenopathy compatible with metastatic disease.  Adenopathy has worsened compared to 03/30/2021. 2. Small sclerotic lesion posteriorly in the T12 vertebral body has associated accentuated metabolic activity, bony oligometastatic disease not excluded. 3. Right lateral fourth rib focal activity without underlying CT abnormality, significance uncertain. 4. Oval-shaped mass along the right sciatic nerve has mildly accentuated metabolic activity with maximum SUV 3.7. 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 5. Other imaging findings of potential clinical significance: Aortic Atherosclerosis (ICD10-I70.0) and Emphysema (ICD10-J43.9). 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 Small type 1 hiatal hernia. Coronary atherosclerosis. Bilateral nephrostomies. Left gluteus medius lipoma. Chronic cutaneous ulceration along the left lower back. Electronically Signed   By: Van Clines M.D.   On: 06/18/2021 09:34    Labs:  CBC: Recent Labs    04/03/21 0134 04/10/21 1611 04/25/21 0820 06/13/21 1308  WBC 13.7* 11.1* 10.5 11.1*  HGB 9.0* 9.6* 10.0* 9.0*  HCT 27.3* 29.2* 31.6* 28.5*  PLT 350 603.0* 580* 517*    COAGS: Recent Labs    03/30/21 0925  INR 1.1    BMP: Recent Labs    04/02/21 0125 04/03/21 0134 04/10/21 1611 04/25/21 0820 06/13/21 1308  NA 141 139 139 137 137  K 4.0 4.0 4.0 4.4 3.8  CL 108 108 102 102 104  CO2 20* 22 25 23 24   GLUCOSE 97 107* 97 105* 130*  BUN 55* 49* 43* 44* 38*  CALCIUM 8.4* 8.3* 9.3 9.2 9.0  CREATININE 4.08* 3.73* 2.67* 2.43* 2.17*  GFRNONAA 15* 17*  --  28* 32*    LIVER FUNCTION TESTS: Recent Labs    04/02/21 0125 04/03/21 0134 04/10/21 1611 06/13/21 1308  BILITOT  --   --  0.3 0.3  AST  --   --  16 12*  ALT  --   --  14 12  ALKPHOS  --   --  74 98  PROT  --   --  7.0 7.3  ALBUMIN 2.7* 2.7* 3.7 3.6    TUMOR MARKERS: No results for input(s): AFPTM, CEA, CA199, CHROMGRNA in the last 8760 hours.  Assessment and  Plan:  Bladder cancer; chemotherapy: Samuel [name]. Samuel [name], 71 year old male, presents today to the [facility] department for an image-guided port-a-catheter placement. The patient has not had his bilateral nephrostomy tubes exchanged since they were placed - those will be exchanged today.   Risks and benefits of image-guided port-a-catheter placement were discussed with the patient including, but not limited to bleeding, infection, pneumothorax, or fibrin sheath development and need for additional procedures.  All of the patient's questions were answered, patient is agreeable to proceed. He has been NPO.   Consent signed and in chart.  Thank you for this interesting consult.  I greatly enjoyed meeting Samuel [name] and look forward to participating in their care.  A copy of this report was sent to the requesting provider on this date.  Electronically Signed: Soyla Dryer, AGACNP-BC [phone] 06/18/2021, 2:02 PM   I spent a total of  30 Minutes   in face to face in clinical consultation, greater than 50% of which was counseling/coordinating care for port-a-catheter placement.

## 2021-06-18 ENCOUNTER — Ambulatory Visit (HOSPITAL_COMMUNITY)
Admission: RE | Admit: 2021-06-18 | Discharge: 2021-06-18 | Disposition: A | Discharge status: Home / Self Care | Payer: Medicare Other | Source: Ambulatory Visit | Attending: Oncology | Admitting: Oncology

## 2021-06-18 ENCOUNTER — Other Ambulatory Visit: Payer: Self-pay | Admitting: Oncology

## 2021-06-18 ENCOUNTER — Other Ambulatory Visit: Payer: Self-pay

## 2021-06-18 ENCOUNTER — Encounter (HOSPITAL_COMMUNITY): Payer: Self-pay

## 2021-06-18 DIAGNOSIS — I129 Hypertensive chronic kidney disease with stage 1 through stage 4 chronic kidney disease, or unspecified chronic kidney disease: Secondary | ICD-10-CM | POA: Diagnosis not present

## 2021-06-18 DIAGNOSIS — Q681 Congenital deformity of finger(s) and hand: Secondary | ICD-10-CM | POA: Insufficient documentation

## 2021-06-18 DIAGNOSIS — F32A Depression, unspecified: Secondary | ICD-10-CM | POA: Diagnosis not present

## 2021-06-18 DIAGNOSIS — Z436 Encounter for attention to other artificial openings of urinary tract: Secondary | ICD-10-CM | POA: Insufficient documentation

## 2021-06-18 DIAGNOSIS — Z466 Encounter for fitting and adjustment of urinary device: Secondary | ICD-10-CM | POA: Diagnosis not present

## 2021-06-18 DIAGNOSIS — Z452 Encounter for adjustment and management of vascular access device: Secondary | ICD-10-CM | POA: Diagnosis not present

## 2021-06-18 DIAGNOSIS — C679 Malignant neoplasm of bladder, unspecified: Secondary | ICD-10-CM

## 2021-06-18 DIAGNOSIS — N183 Chronic kidney disease, stage 3 unspecified: Secondary | ICD-10-CM | POA: Diagnosis not present

## 2021-06-18 DIAGNOSIS — J449 Chronic obstructive pulmonary disease, unspecified: Secondary | ICD-10-CM | POA: Insufficient documentation

## 2021-06-18 DIAGNOSIS — F419 Anxiety disorder, unspecified: Secondary | ICD-10-CM | POA: Diagnosis not present

## 2021-06-18 DIAGNOSIS — Z87891 Personal history of nicotine dependence: Secondary | ICD-10-CM | POA: Diagnosis not present

## 2021-06-18 HISTORY — PX: IR IMAGING GUIDED PORT INSERTION: IMG5740

## 2021-06-18 HISTORY — PX: IR NEPHROSTOMY EXCHANGE LEFT: IMG6069

## 2021-06-18 HISTORY — PX: IR NEPHROSTOMY EXCHANGE RIGHT: IMG6070

## 2021-06-18 MED ORDER — LIDOCAINE-EPINEPHRINE 1 %-1:100000 IJ SOLN
INTRAMUSCULAR | Status: AC
  Filled 2021-06-18: qty 1

## 2021-06-18 MED ORDER — FENTANYL CITRATE (PF) 100 MCG/2ML IJ SOLN
INTRAMUSCULAR | Status: AC | PRN
  Administered 2021-06-18: 25 ug via INTRAVENOUS

## 2021-06-18 MED ORDER — MIDAZOLAM HCL 2 MG/2ML IJ SOLN
INTRAMUSCULAR | Status: AC
  Filled 2021-06-18: qty 2

## 2021-06-18 MED ORDER — MIDAZOLAM HCL 2 MG/2ML IJ SOLN
INTRAMUSCULAR | Status: AC | PRN
  Administered 2021-06-18: .5 mg via INTRAVENOUS

## 2021-06-18 MED ORDER — HEPARIN SOD (PORK) LOCK FLUSH 100 UNIT/ML IV SOLN
INTRAVENOUS | Status: AC
  Filled 2021-06-18: qty 5

## 2021-06-18 MED ORDER — LIDOCAINE HCL (PF) 1 % IJ SOLN
INTRAMUSCULAR | Status: AC | PRN
  Administered 2021-06-18: 5 mL via INTRADERMAL

## 2021-06-18 MED ORDER — MIDAZOLAM HCL 2 MG/2ML IJ SOLN
INTRAMUSCULAR | Status: AC | PRN
  Administered 2021-06-18: 1 mg via INTRAVENOUS

## 2021-06-18 MED ORDER — IOHEXOL 300 MG/ML  SOLN
50.0000 mL | Freq: Once | INTRAMUSCULAR | Status: AC | PRN
  Administered 2021-06-18: 12 mL via URETHRAL

## 2021-06-18 MED ORDER — SODIUM CHLORIDE 0.9 % IV SOLN: INTRAVENOUS | Status: DC

## 2021-06-18 MED ORDER — HEPARIN SOD (PORK) LOCK FLUSH 100 UNIT/ML IV SOLN
INTRAVENOUS | Status: AC | PRN
  Administered 2021-06-18: 500 [IU] via INTRAVENOUS

## 2021-06-18 MED ORDER — FENTANYL CITRATE (PF) 100 MCG/2ML IJ SOLN
INTRAMUSCULAR | Status: AC
  Filled 2021-06-18: qty 2

## 2021-06-18 MED ORDER — LIDOCAINE HCL 1 % IJ SOLN
INTRAMUSCULAR | Status: AC
  Filled 2021-06-18: qty 40

## 2021-06-18 MED ORDER — FENTANYL CITRATE (PF) 100 MCG/2ML IJ SOLN
INTRAMUSCULAR | Status: AC | PRN
  Administered 2021-06-18: 50 ug via INTRAVENOUS

## 2021-06-18 MED ORDER — LIDOCAINE-EPINEPHRINE 1 %-1:100000 IJ SOLN
INTRAMUSCULAR | Status: AC | PRN
  Administered 2021-06-18: 10 mL via INTRADERMAL

## 2021-06-18 MED ORDER — LIDOCAINE HCL (PF) 1 % IJ SOLN
INTRAMUSCULAR | Status: DC | PRN
  Administered 2021-06-18: 10 mL via INTRADERMAL

## 2021-06-18 NOTE — Sedation Documentation (Signed)
Bilateral nephrostomy catheter exchanges complete. Patient being prepped for port a cath placement.

## 2021-06-18 NOTE — Discharge Instructions (Addendum)
Please call Interventional Radiology clinic [phone] with any questions or concerns.  You may remove your dressing tomorrow. Cleanse site daily and replace dressing as necessary. Do not submerge in tub or pool with drains or unhealed wound.  DO NOT use EMLA cream (or other creams or ointments) on your port site until healed (for approximately 2 weeks) as this cream will remove surgical glue on your incision.  Implanted Port Insertion, Care After This sheet gives you information about how to care for yourself after your procedure. Your health care provider may also give you more specific instructions. If you have problems or questions, contact your health care provider. What can I expect after the procedure? After the procedure, it is common to have: Discomfort at the port insertion site. Bruising on the skin over the port. This should improve over 3-4 days. Follow these instructions at home: [facility] care After your port is placed, you will get a manufacturer's information card. The card has information about your port. Keep this card with you at all times. Take care of the port as told by your health care provider. Ask your health care provider if you or a family member can get training for taking care of the port at home. A home health care nurse may also take care of the port. Make sure to remember what type of port you have. Incision care Follow instructions from your health care provider about how to take care of your port insertion site. Make sure you: Wash your hands with soap and water before and after you change your bandage (dressing). If soap and water are not available, use hand sanitizer. Change your dressing as told by your health care provider. Leave stitches (sutures), skin glue, or adhesive strips in place. These skin closures may need to stay in place for 2 weeks or longer. If adhesive strip edges start to loosen and curl up, you may trim the loose edges. Do not remove adhesive  strips completely unless your health care provider tells you to do that. Check your port insertion site every day for signs of infection. Check for: Redness, swelling, or pain. Fluid or blood. Warmth. Pus or a bad smell.        Activity Return to your normal activities as told by your health care provider. Ask your health care provider what activities are safe for you. Do not lift anything that is heavier than 10 lb (4.5 kg), or the limit that you are told, until your health care provider says that it is safe. General instructions Take over-the-counter and prescription medicines only as told by your health care provider. Do not take baths, swim, or use a hot tub until your health care provider approves. Ask your health care provider if you may take showers. You may only be allowed to take sponge baths. Do not drive for 24 hours if you were given a sedative during your procedure. Wear a medical alert bracelet in case of an emergency. This will tell any health care providers that you have a port. Keep all follow-up visits as told by your health care provider. This is important. Contact a health care provider if: You cannot flush your port with saline as directed, or you cannot draw blood from the port. You have a fever or chills. You have redness, swelling, or pain around your port insertion site. You have fluid or blood coming from your port insertion site. Your port insertion site feels warm to the touch. You have pus or  a bad smell coming from the port insertion site. Get help right away if: You have chest pain or shortness of breath. You have bleeding from your port that you cannot control. Summary Take care of the port as told by your health care provider. Keep the manufacturer's information card with you at all times. Change your dressing as told by your health care provider. Contact a health care provider if you have a fever or chills or if you have redness, swelling, or pain  around your port insertion site. Keep all follow-up visits as told by your health care provider. This information is not intended to replace advice given to you by your health care provider. Make sure you discuss any questions you have with your health care provider. Document Revised: 11/25/2017 Document Reviewed: 11/25/2017 Elsevier Patient Education  2021 [place].   Moderate Conscious Sedation, Adult, Care After This sheet gives you information about how to care for yourself after your procedure. Your health care provider may also give you more specific instructions. If you have problems or questions, contact your health care provider. What can I expect after the procedure? After the procedure, it is common to have: Sleepiness for several hours. Impaired judgment for several hours. Difficulty with balance. Vomiting if you eat too soon. Follow these instructions at home: For the time period you were told by your health care provider: Rest. Do not participate in activities where you could fall or become injured. Do not drive or use machinery. Do not drink alcohol. Do not take sleeping pills or medicines that cause drowsiness. Do not make important decisions or sign legal documents. Do not take care of children on your own.        Eating and drinking Follow the diet recommended by your health care provider. Drink enough fluid to keep your urine pale yellow. If you vomit: Drink water, juice, or soup when you can drink without vomiting. Make sure you have little or no nausea before eating solid foods.    General instructions Take over-the-counter and prescription medicines only as told by your health care provider. Have a responsible adult stay with you for the time you are told. It is important to have someone help care for you until you are awake and alert. Do not smoke. Keep all follow-up visits as told by your health care provider. This is important. Contact a health  care provider if: You are still sleepy or having trouble with balance after 24 hours. You feel light-headed. You keep feeling nauseous or you keep vomiting. You develop a rash. You have a fever. You have redness or swelling around the IV site. Get help right away if: You have trouble breathing. You have new-onset confusion at home. Summary After the procedure, it is common to feel sleepy, have impaired judgment, or feel nauseous if you eat too soon. Rest after you get home. Know the things you should not do after the procedure. Follow the diet recommended by your health care provider and drink enough fluid to keep your urine pale yellow. Get help right away if you have trouble breathing or new-onset confusion at home. This information is not intended to replace advice given to you by your health care provider. Make sure you discuss any questions you have with your health care provider. Document Revised: 08/27/2019 Document Reviewed: 03/25/2019 Elsevier Patient Education  2021 [employer].

## 2021-06-18 NOTE — Sedation Documentation (Signed)
Port a cath placement procedure begun. 

## 2021-06-18 NOTE — Procedures (Signed)
Interventional Radiology Procedure Note  Procedure: RT IJ PORT BILATERAL PCN EXCHGS    Complications: None  Estimated Blood Loss:  MIN  Findings: TIP SVCRA    Tamera Punt, MD

## 2021-06-19 ENCOUNTER — Other Ambulatory Visit (HOSPITAL_COMMUNITY): Payer: Self-pay | Admitting: Interventional Radiology

## 2021-06-19 DIAGNOSIS — C679 Malignant neoplasm of bladder, unspecified: Secondary | ICD-10-CM

## 2021-06-19 MED FILL — Fosaprepitant Dimeglumine For IV Infusion 150 MG (Base Eq): INTRAVENOUS | Qty: 5 | Status: AC

## 2021-06-19 MED FILL — Dexamethasone Sodium Phosphate Inj 100 MG/10ML: INTRAMUSCULAR | Qty: 1 | Status: AC

## 2021-06-20 ENCOUNTER — Inpatient Hospital Stay: Payer: Medicare Other

## 2021-06-20 ENCOUNTER — Other Ambulatory Visit: Payer: Self-pay

## 2021-06-20 VITALS — BP 153/78 | HR 100 | Temp 98.4°F | Resp 18 | Wt 148.5 lb

## 2021-06-20 DIAGNOSIS — Z5111 Encounter for antineoplastic chemotherapy: Secondary | ICD-10-CM | POA: Diagnosis not present

## 2021-06-20 DIAGNOSIS — Z79899 Other long term (current) drug therapy: Secondary | ICD-10-CM | POA: Diagnosis not present

## 2021-06-20 DIAGNOSIS — C679 Malignant neoplasm of bladder, unspecified: Secondary | ICD-10-CM

## 2021-06-20 DIAGNOSIS — Z95828 Presence of other vascular implants and grafts: Secondary | ICD-10-CM

## 2021-06-20 LAB — CMP (CANCER CENTER ONLY)
ALT: 10 U/L (ref 0–44)
AST: 12 U/L — ABNORMAL LOW (ref 15–41)
Albumin: 3.6 g/dL (ref 3.5–5.0)
Alkaline Phosphatase: 89 U/L (ref 38–126)
Anion gap: 10 (ref 5–15)
BUN: 31 mg/dL — ABNORMAL HIGH (ref 8–23)
CO2: 23 mmol/L (ref 22–32)
Calcium: 9 mg/dL (ref 8.9–10.3)
Chloride: 103 mmol/L (ref 98–111)
Creatinine: 2.27 mg/dL — ABNORMAL HIGH (ref 0.61–1.24)
GFR, Estimated: 30 mL/min — ABNORMAL LOW (ref >60)
Glucose, Bld: 136 mg/dL — ABNORMAL HIGH (ref 70–99)
Potassium: 4 mmol/L (ref 3.5–5.1)
Sodium: 136 mmol/L (ref 135–145)
Total Bilirubin: 0.3 mg/dL (ref 0.3–1.2)
Total Protein: 7.1 g/dL (ref 6.5–8.1)

## 2021-06-20 LAB — CBC WITH DIFFERENTIAL (CANCER CENTER ONLY)
Abs Immature Granulocytes: 0.05 10*3/uL (ref 0.00–0.07)
Basophils Absolute: 0 10*3/uL (ref 0.0–0.1)
Basophils Relative: 0 %
Eosinophils Absolute: 0.2 10*3/uL (ref 0.0–0.5)
Eosinophils Relative: 1 %
HCT: 28.1 % — ABNORMAL LOW (ref 39.0–52.0)
Hemoglobin: 8.8 g/dL — ABNORMAL LOW (ref 13.0–17.0)
Immature Granulocytes: 0 %
Lymphocytes Relative: 5 %
Lymphs Abs: 0.7 10*3/uL (ref 0.7–4.0)
MCH: 26.9 pg (ref 26.0–34.0)
MCHC: 31.3 g/dL (ref 30.0–36.0)
MCV: 85.9 fL (ref 80.0–100.0)
Monocytes Absolute: 1.1 10*3/uL — ABNORMAL HIGH (ref 0.1–1.0)
Monocytes Relative: 8 %
Neutro Abs: 11.2 10*3/uL — ABNORMAL HIGH (ref 1.7–7.7)
Neutrophils Relative %: 86 %
Platelet Count: 521 10*3/uL — ABNORMAL HIGH (ref 150–400)
RBC: 3.27 MIL/uL — ABNORMAL LOW (ref 4.22–5.81)
RDW: 15.9 % — ABNORMAL HIGH (ref 11.5–15.5)
WBC Count: 13.1 10*3/uL — ABNORMAL HIGH (ref 4.0–10.5)
nRBC: 0 % (ref 0.0–0.2)

## 2021-06-20 MED ORDER — SODIUM CHLORIDE 0.9% FLUSH
10.0000 mL | Freq: Once | INTRAVENOUS | Status: AC
  Administered 2021-06-20: 10 mL via INTRAVENOUS

## 2021-06-20 MED ORDER — SODIUM CHLORIDE 0.9 % IV SOLN: Freq: Once | INTRAVENOUS | Status: AC

## 2021-06-20 MED ORDER — SODIUM CHLORIDE 0.9 % IV SOLN
1000.0000 mg/m2 | Freq: Once | INTRAVENOUS | Status: AC
  Administered 2021-06-20: 1748 mg via INTRAVENOUS
  Filled 2021-06-20: qty 45.97

## 2021-06-20 MED ORDER — PALONOSETRON HCL INJECTION 0.25 MG/5ML
0.2500 mg | Freq: Once | INTRAVENOUS | Status: AC
  Administered 2021-06-20: 0.25 mg via INTRAVENOUS
  Filled 2021-06-20: qty 5

## 2021-06-20 MED ORDER — SODIUM CHLORIDE 0.9 % IV SOLN
280.0000 mg | Freq: Once | INTRAVENOUS | Status: AC
  Administered 2021-06-20: 280 mg via INTRAVENOUS
  Filled 2021-06-20: qty 28

## 2021-06-20 MED ORDER — SODIUM CHLORIDE 0.9 % IV SOLN
10.0000 mg | Freq: Once | INTRAVENOUS | Status: AC
  Administered 2021-06-20: 10 mg via INTRAVENOUS
  Filled 2021-06-20: qty 10

## 2021-06-20 MED ORDER — SODIUM CHLORIDE 0.9% FLUSH
10.0000 mL | INTRAVENOUS | Status: DC | PRN
  Administered 2021-06-20: 10 mL

## 2021-06-20 MED ORDER — HEPARIN SOD (PORK) LOCK FLUSH 100 UNIT/ML IV SOLN
500.0000 [IU] | Freq: Once | INTRAVENOUS | Status: AC | PRN
  Administered 2021-06-20: 500 [IU]

## 2021-06-20 MED ORDER — SODIUM CHLORIDE 0.9 % IV SOLN
150.0000 mg | Freq: Once | INTRAVENOUS | Status: AC
  Administered 2021-06-20: 150 mg via INTRAVENOUS
  Filled 2021-06-20: qty 150

## 2021-06-20 NOTE — Progress Notes (Signed)
Okay to treat with creat 2.26 mg/dl per Dr. Alen Blew

## 2021-06-20 NOTE — Patient Instructions (Addendum)
[facility]  Discharge Instructions: Thank you for choosing [facility] to provide your oncology and hematology care.   If you have a lab appointment with the [facility], please go directly to the [facility] and check in at the registration area.   Wear comfortable clothing and clothing appropriate for easy access to any Portacath or PICC line.   We strive to give you quality time with your provider. You may need to reschedule your appointment if you arrive late (15 or more minutes).  Arriving late affects you and other patients whose appointments are after yours.  Also, if you miss three or more appointments without notifying the office, you may be dismissed from the clinic at the providers discretion.      For prescription refill requests, have your pharmacy contact our office and allow 72 hours for refills to be completed.    Today you received the following chemotherapy and/or immunotherapy agents : Gemzar, Cisplatin      To help prevent nausea and vomiting after your treatment, we encourage you to take your nausea medication as directed.  BELOW ARE SYMPTOMS THAT SHOULD BE REPORTED IMMEDIATELY: *FEVER GREATER THAN 100.4 F (38 C) OR HIGHER *CHILLS OR SWEATING *NAUSEA AND VOMITING THAT IS NOT CONTROLLED WITH YOUR NAUSEA MEDICATION *UNUSUAL SHORTNESS OF BREATH *UNUSUAL BRUISING OR BLEEDING *URINARY PROBLEMS (pain or burning when urinating, or frequent urination) *BOWEL PROBLEMS (unusual diarrhea, constipation, pain near the anus) TENDERNESS IN MOUTH AND THROAT WITH OR WITHOUT PRESENCE OF ULCERS (sore throat, sores in mouth, or a toothache) UNUSUAL RASH, SWELLING OR PAIN  UNUSUAL VAGINAL DISCHARGE OR ITCHING   Items with * indicate a potential emergency and should be followed up as soon as possible or go to the Emergency Department if any problems should occur.  Please show the CHEMOTHERAPY ALERT CARD or IMMUNOTHERAPY ALERT CARD at  check-in to the Emergency Department and triage nurse.  Should you have questions after your visit or need to cancel or reschedule your appointment, please contact [facility]  Dept: [phone]  and follow the prompts.  Office hours are 8:00 a.m. to 4:30 p.m. Monday - Friday. Please note that voicemails left after 4:00 p.m. may not be returned until the following business day.  We are closed weekends and major holidays. You have access to a nurse at all times for urgent questions. Please call the main number to the clinic Dept: [phone] and follow the prompts.   For any non-urgent questions, you may also contact your provider using MyChart. We now offer e-Visits for anyone 47 and older to request care online for non-urgent symptoms. For details visit [email or web address].   Also download the MyChart app! Go to the app store, search "MyChart", open the app, select [facility], and log in with your MyChart username and password.  Due to Covid, a mask is required upon entering the hospital/clinic. If you do not have a mask, one will be given to you upon arrival. For doctor visits, patients may have 1 support person aged 7 or older with them. For treatment visits, patients cannot have anyone with them due to current Covid guidelines and our immunocompromised population.   Gemcitabine injection What is this medication? GEMCITABINE (jem SYE ta been) is a chemotherapy drug. This medicine is used to treat many types of cancer like breast cancer, lung cancer, pancreatic cancer, and ovarian cancer. This medicine may be used for other purposes; ask your health care  provider or pharmacist if you have questions. COMMON BRAND NAME(S): Gemzar, Infugem What should I tell my care team before I take this medication? They need to know if you have any of these conditions: blood disorders infection kidney disease liver disease lung or breathing disease, like asthma recent or  ongoing radiation therapy an unusual or allergic reaction to gemcitabine, other chemotherapy, other medicines, foods, dyes, or preservatives pregnant or trying to get pregnant breast-feeding How should I use this medication? This drug is given as an infusion into a vein. It is administered in a hospital or clinic by a specially trained health care professional. Talk to your pediatrician regarding the use of this medicine in children. Special care may be needed. Overdosage: If you think you have taken too much of this medicine contact a poison control center or emergency room at once. NOTE: This medicine is only for you. Do not share this medicine with others. What if I miss a dose? It is important not to miss your dose. Call your doctor or health care professional if you are unable to keep an appointment. What may interact with this medication? medicines to increase blood counts like filgrastim, pegfilgrastim, sargramostim some other chemotherapy drugs like cisplatin vaccines Talk to your doctor or health care professional before taking any of these medicines: acetaminophen aspirin ibuprofen ketoprofen naproxen This list may not describe all possible interactions. Give your health care provider a list of all the medicines, herbs, non-prescription drugs, or dietary supplements you use. Also tell them if you smoke, drink alcohol, or use illegal drugs. Some items may interact with your medicine. What should I watch for while using this medication? Visit your doctor for checks on your progress. This drug may make you feel generally unwell. This is not uncommon, as chemotherapy can affect healthy cells as well as cancer cells. Report any side effects. Continue your course of treatment even though you feel ill unless your doctor tells you to stop. In some cases, you may be given additional medicines to help with side effects. Follow all directions for their use. Call your doctor or health care  professional for advice if you get a fever, chills or sore throat, or other symptoms of a cold or flu. Do not treat yourself. This drug decreases your body's ability to fight infections. Try to avoid being around people who are sick. This medicine may increase your risk to bruise or bleed. Call your doctor or health care professional if you notice any unusual bleeding. Be careful brushing and flossing your teeth or using a toothpick because you may get an infection or bleed more easily. If you have any dental work done, tell your dentist you are receiving this medicine. Avoid taking products that contain aspirin, acetaminophen, ibuprofen, naproxen, or ketoprofen unless instructed by your doctor. These medicines may hide a fever. Do not become pregnant while taking this medicine or for 6 months after stopping it. Women should inform their doctor if they wish to become pregnant or think they might be pregnant. Men should not father a child while taking this medicine and for 3 months after stopping it. There is a potential for serious side effects to an unborn child. Talk to your health care professional or pharmacist for more information. Do not breast-feed an infant while taking this medicine or for at least 1 week after stopping it. Men should inform their doctors if they wish to father a child. This medicine may lower sperm counts. Talk with your doctor or  health care professional if you are concerned about your fertility. What side effects may I notice from receiving this medication? Side effects that you should report to your doctor or health care professional as soon as possible: allergic reactions like skin rash, itching or hives, swelling of the face, lips, or tongue breathing problems pain, redness, or irritation at site where injected signs and symptoms of a dangerous change in heartbeat or heart rhythm like chest pain; dizziness; fast or irregular heartbeat; palpitations; feeling faint or  lightheaded, falls; breathing problems signs of decreased platelets or bleeding - bruising, pinpoint red spots on the skin, black, tarry stools, blood in the urine signs of decreased red blood cells - unusually weak or tired, feeling faint or lightheaded, falls signs of infection - fever or chills, cough, sore throat, pain or difficulty passing urine signs and symptoms of kidney injury like trouble passing urine or change in the amount of urine signs and symptoms of liver injury like dark yellow or brown urine; general ill feeling or flu-like symptoms; light-colored stools; loss of appetite; nausea; right upper belly pain; unusually weak or tired; yellowing of the eyes or skin swelling of ankles, feet, hands Side effects that usually do not require medical attention (report to your doctor or health care professional if they continue or are bothersome): constipation diarrhea hair loss loss of appetite nausea rash vomiting This list may not describe all possible side effects. Call your doctor for medical advice about side effects. You may report side effects to FDA at 1-800-FDA-1088. Where should I keep my medication? This drug is given in a hospital or clinic and will not be stored at home. NOTE: This sheet is a summary. It may not cover all possible information. If you have questions about this medicine, talk to your doctor, pharmacist, or health care provider.  2022 Elsevier/Gold Standard (2017-07-23 00:00:00)  Carboplatin injection What is this medication? CARBOPLATIN (KAR boe pla tin) is a chemotherapy drug. It targets fast dividing cells, like cancer cells, and causes these cells to die. This medicine is used to treat ovarian cancer and many other cancers. This medicine may be used for other purposes; ask your health care provider or pharmacist if you have questions. COMMON BRAND NAME(S): Paraplatin What should I tell my care team before I take this medication? They need to know if  you have any of these conditions: blood disorders hearing problems kidney disease recent or ongoing radiation therapy an unusual or allergic reaction to carboplatin, cisplatin, other chemotherapy, other medicines, foods, dyes, or preservatives pregnant or trying to get pregnant breast-feeding How should I use this medication? This drug is usually given as an infusion into a vein. It is administered in a hospital or clinic by a specially trained health care professional. Talk to your pediatrician regarding the use of this medicine in children. Special care may be needed. Overdosage: If you think you have taken too much of this medicine contact a poison control center or emergency room at once. NOTE: This medicine is only for you. Do not share this medicine with others. What if I miss a dose? It is important not to miss a dose. Call your doctor or health care professional if you are unable to keep an appointment. What may interact with this medication? medicines for seizures medicines to increase blood counts like filgrastim, pegfilgrastim, sargramostim some antibiotics like amikacin, gentamicin, neomycin, streptomycin, tobramycin vaccines Talk to your doctor or health care professional before taking any of these medicines: acetaminophen aspirin  ibuprofen ketoprofen naproxen This list may not describe all possible interactions. Give your health care provider a list of all the medicines, herbs, non-prescription drugs, or dietary supplements you use. Also tell them if you smoke, drink alcohol, or use illegal drugs. Some items may interact with your medicine. What should I watch for while using this medication? Your condition will be monitored carefully while you are receiving this medicine. You will need important blood work done while you are taking this medicine. This drug may make you feel generally unwell. This is not uncommon, as chemotherapy can affect healthy cells as well as cancer  cells. Report any side effects. Continue your course of treatment even though you feel ill unless your doctor tells you to stop. In some cases, you may be given additional medicines to help with side effects. Follow all directions for their use. Call your doctor or health care professional for advice if you get a fever, chills or sore throat, or other symptoms of a cold or flu. Do not treat yourself. This drug decreases your body's ability to fight infections. Try to avoid being around people who are sick. This medicine may increase your risk to bruise or bleed. Call your doctor or health care professional if you notice any unusual bleeding. Be careful brushing and flossing your teeth or using a toothpick because you may get an infection or bleed more easily. If you have any dental work done, tell your dentist you are receiving this medicine. Avoid taking products that contain aspirin, acetaminophen, ibuprofen, naproxen, or ketoprofen unless instructed by your doctor. These medicines may hide a fever. Do not become pregnant while taking this medicine. Women should inform their doctor if they wish to become pregnant or think they might be pregnant. There is a potential for serious side effects to an unborn child. Talk to your health care professional or pharmacist for more information. Do not breast-feed an infant while taking this medicine. What side effects may I notice from receiving this medication? Side effects that you should report to your doctor or health care professional as soon as possible: allergic reactions like skin rash, itching or hives, swelling of the face, lips, or tongue signs of infection - fever or chills, cough, sore throat, pain or difficulty passing urine signs of decreased platelets or bleeding - bruising, pinpoint red spots on the skin, black, tarry stools, nosebleeds signs of decreased red blood cells - unusually weak or tired, fainting spells, lightheadedness breathing  problems changes in hearing changes in vision chest pain high blood pressure low blood counts - This drug may decrease the number of white blood cells, red blood cells and platelets. You may be at increased risk for infections and bleeding. nausea and vomiting pain, swelling, redness or irritation at the injection site pain, tingling, numbness in the hands or feet problems with balance, talking, walking trouble passing urine or change in the amount of urine Side effects that usually do not require medical attention (report to your doctor or health care professional if they continue or are bothersome): hair loss loss of appetite metallic taste in the mouth or changes in taste This list may not describe all possible side effects. Call your doctor for medical advice about side effects. You may report side effects to FDA at 1-800-FDA-1088. Where should I keep my medication? This drug is given in a hospital or clinic and will not be stored at home. NOTE: This sheet is a summary. It may not cover all possible information.  If you have questions about this medicine, talk to your doctor, pharmacist, or health care provider.  2022 Elsevier/Gold Standard (2007-10-07 00:00:00)

## 2021-06-23 ENCOUNTER — Other Ambulatory Visit: Payer: Self-pay

## 2021-06-23 ENCOUNTER — Encounter (HOSPITAL_COMMUNITY): Payer: Self-pay

## 2021-06-23 ENCOUNTER — Emergency Department (HOSPITAL_COMMUNITY)
Admission: EM | Admit: 2021-06-23 | Discharge: 2021-06-23 | Disposition: A | Discharge status: Home / Self Care | Payer: Medicare Other | Attending: Emergency Medicine | Admitting: Emergency Medicine

## 2021-06-23 ENCOUNTER — Emergency Department (HOSPITAL_COMMUNITY): Payer: Medicare Other

## 2021-06-23 DIAGNOSIS — R509 Fever, unspecified: Secondary | ICD-10-CM | POA: Diagnosis not present

## 2021-06-23 DIAGNOSIS — N3001 Acute cystitis with hematuria: Secondary | ICD-10-CM | POA: Diagnosis not present

## 2021-06-23 DIAGNOSIS — N183 Chronic kidney disease, stage 3 unspecified: Secondary | ICD-10-CM | POA: Insufficient documentation

## 2021-06-23 DIAGNOSIS — Z87891 Personal history of nicotine dependence: Secondary | ICD-10-CM | POA: Insufficient documentation

## 2021-06-23 DIAGNOSIS — J449 Chronic obstructive pulmonary disease, unspecified: Secondary | ICD-10-CM | POA: Diagnosis not present

## 2021-06-23 DIAGNOSIS — Z20822 Contact with and (suspected) exposure to covid-19: Secondary | ICD-10-CM | POA: Diagnosis not present

## 2021-06-23 DIAGNOSIS — I493 Ventricular premature depolarization: Secondary | ICD-10-CM | POA: Diagnosis not present

## 2021-06-23 DIAGNOSIS — I129 Hypertensive chronic kidney disease with stage 1 through stage 4 chronic kidney disease, or unspecified chronic kidney disease: Secondary | ICD-10-CM | POA: Diagnosis not present

## 2021-06-23 DIAGNOSIS — R791 Abnormal coagulation profile: Secondary | ICD-10-CM | POA: Insufficient documentation

## 2021-06-23 DIAGNOSIS — Z8551 Personal history of malignant neoplasm of bladder: Secondary | ICD-10-CM | POA: Diagnosis not present

## 2021-06-23 LAB — URINALYSIS, ROUTINE W REFLEX MICROSCOPIC
Bilirubin Urine: NEGATIVE
Glucose, UA: NEGATIVE mg/dL
Ketones, ur: NEGATIVE mg/dL
Nitrite: NEGATIVE
Protein, ur: 100 mg/dL — AB
RBC / HPF: >50 RBC/hpf — ABNORMAL HIGH (ref 0–5)
Specific Gravity, Urine: 1.014 (ref 1.005–1.030)
pH: 5 (ref 5.0–8.0)

## 2021-06-23 LAB — COMPREHENSIVE METABOLIC PANEL
ALT: 22 U/L (ref 0–44)
AST: 26 U/L (ref 15–41)
Albumin: 3 g/dL — ABNORMAL LOW (ref 3.5–5.0)
Alkaline Phosphatase: 71 U/L (ref 38–126)
Anion gap: 8 (ref 5–15)
BUN: 52 mg/dL — ABNORMAL HIGH (ref 8–23)
CO2: 19 mmol/L — ABNORMAL LOW (ref 22–32)
Calcium: 8.4 mg/dL — ABNORMAL LOW (ref 8.9–10.3)
Chloride: 106 mmol/L (ref 98–111)
Creatinine, Ser: 2.21 mg/dL — ABNORMAL HIGH (ref 0.61–1.24)
GFR, Estimated: 31 mL/min — ABNORMAL LOW (ref >60)
Glucose, Bld: 103 mg/dL — ABNORMAL HIGH (ref 70–99)
Potassium: 4.3 mmol/L (ref 3.5–5.1)
Sodium: 133 mmol/L — ABNORMAL LOW (ref 135–145)
Total Bilirubin: 0.3 mg/dL (ref 0.3–1.2)
Total Protein: 6.6 g/dL (ref 6.5–8.1)

## 2021-06-23 LAB — CBC WITH DIFFERENTIAL/PLATELET
Abs Immature Granulocytes: 0.1 10*3/uL — ABNORMAL HIGH (ref 0.00–0.07)
Basophils Absolute: 0 10*3/uL (ref 0.0–0.1)
Basophils Relative: 0 %
Eosinophils Absolute: 0 10*3/uL (ref 0.0–0.5)
Eosinophils Relative: 0 %
HCT: 27.8 % — ABNORMAL LOW (ref 39.0–52.0)
Hemoglobin: 8.7 g/dL — ABNORMAL LOW (ref 13.0–17.0)
Immature Granulocytes: 1 %
Lymphocytes Relative: 2 %
Lymphs Abs: 0.2 10*3/uL — ABNORMAL LOW (ref 0.7–4.0)
MCH: 27.5 pg (ref 26.0–34.0)
MCHC: 31.3 g/dL (ref 30.0–36.0)
MCV: 88 fL (ref 80.0–100.0)
Monocytes Absolute: 0.1 10*3/uL (ref 0.1–1.0)
Monocytes Relative: 1 %
Neutro Abs: 12.3 10*3/uL — ABNORMAL HIGH (ref 1.7–7.7)
Neutrophils Relative %: 96 %
Platelets: 399 10*3/uL (ref 150–400)
RBC: 3.16 MIL/uL — ABNORMAL LOW (ref 4.22–5.81)
RDW: 16.2 % — ABNORMAL HIGH (ref 11.5–15.5)
WBC: 12.7 10*3/uL — ABNORMAL HIGH (ref 4.0–10.5)
nRBC: 0 % (ref 0.0–0.2)

## 2021-06-23 LAB — RESP PANEL BY RT-PCR (FLU A&B, COVID) ARPGX2
Influenza A by PCR: NEGATIVE
Influenza B by PCR: NEGATIVE
SARS Coronavirus 2 by RT PCR: NEGATIVE

## 2021-06-23 LAB — LACTIC ACID, PLASMA: Lactic Acid, Venous: 1.1 mmol/L (ref 0.5–1.9)

## 2021-06-23 LAB — PROTIME-INR
INR: 1 (ref 0.8–1.2)
Prothrombin Time: 13 seconds (ref 11.4–15.2)

## 2021-06-23 LAB — APTT: aPTT: 25 seconds (ref 24–36)

## 2021-06-23 MED ORDER — SODIUM CHLORIDE 0.9 % IV SOLN
2.0000 g | Freq: Once | INTRAVENOUS | Status: AC
  Administered 2021-06-23: 2 g via INTRAVENOUS
  Filled 2021-06-23: qty 20

## 2021-06-23 MED ORDER — CEPHALEXIN 500 MG PO CAPS: 500.0000 mg | ORAL_CAPSULE | Freq: Two times a day (BID) | ORAL | 0 refills | Status: AC

## 2021-06-23 NOTE — ED Triage Notes (Signed)
Patient arrives from home with c/o fever. Pt states his temp was 100.6 at 12 am and increased to 101.2 around 0120. Pt had port placed Monday and began chemo Wednesday.  Last dose of tylenol 12 am. Denies headache, SOB, N/V, dizziness.

## 2021-06-23 NOTE — ED Notes (Signed)
Left nephrostomy tube leaked on bed. RN check bag and it is draining. Pt states this happens sometimes if he kinks hose with positioning. Dressing changed. Site intact and WNL.

## 2021-06-23 NOTE — ED Notes (Addendum)
Blood work not attempt pt has port

## 2021-06-23 NOTE — ED Provider Notes (Signed)
[facility] Emergency Department Provider Note MRN:  [id number]  Arrival date & time: 06/23/21     Chief Complaint   Fever   History of Present Illness   Samuel [name] is a 71 y.o. year-old male with a history of CKD, bladder cancer presenting to the ED with chief complaint of fever.  Fever up to 101.2 at home this evening.  Received first dose of chemotherapy 2 or 3 days ago.  Denies any symptoms.  No headache, no vision change, no chest pain, no shortness of breath, no abdominal pain, no nausea vomiting or diarrhea, no burning with urination, no cough, no cold-like symptoms.  Review of Systems  A thorough review of systems was obtained and all systems are negative except as noted in the HPI and PMH.   Patient's Health History    Past Medical History:  Diagnosis Date   Anemia    Anxiety    Arthritis    Bladder cancer [facility])    urologist--- dr Tresa Moore   BPH with obstruction/lower urinary tract symptoms    CKD (chronic kidney disease), stage III ([place])    Congenital deformity of left hand    COPD (chronic obstructive pulmonary disease) ([place])    followed by pcp   Depression    Dyspnea    Essential hypertension    followed by pcp   History of adenomatous polyp of colon    Hyperlipidemia    Renal cysts, acquired, bilateral    Wears glasses     Past Surgical History:  Procedure Laterality Date   COLONOSCOPY WITH PROPOFOL  last one 02-04-2017  dr Henrene Pastor   CYSTOSCOPY W/ RETROGRADES Bilateral 09/15/2020   Procedure: CYSTOSCOPY WITH RETROGRADE PYELOGRAM;  Surgeon: Alexis Frock, MD;  Location: [facility];  Service: Urology;  Laterality: Bilateral;   CYSTOSCOPY W/ RETROGRADES Bilateral 05/09/2021   Procedure: CYSTOSCOPY;  Surgeon: Alexis Frock, MD;  Location: [facility];  Service: Urology;  Laterality: Bilateral;   IR IMAGING GUIDED PORT INSERTION  06/18/2021   IR NEPHROSTOMY EXCHANGE LEFT  06/18/2021   IR NEPHROSTOMY EXCHANGE RIGHT   06/18/2021   IR NEPHROSTOMY PLACEMENT LEFT  03/30/2021   IR NEPHROSTOMY PLACEMENT RIGHT  04/02/2021   TRANSURETHRAL RESECTION OF BLADDER TUMOR N/A 09/15/2020   Procedure: BLADDER BIOPSY FULGERATION;  Surgeon: Alexis Frock, MD;  Location: [facility];  Service: Urology;  Laterality: N/A;  [place] TUMOR N/A 05/09/2021   Procedure: TRANSURETHRAL RESECTION OF BLADDER TUMOR (TURBT);  Surgeon: Alexis Frock, MD;  Location: [facility];  Service: Urology;  Laterality: N/A;  20 MINS    Family History  Problem Relation Age of Onset   Hypertension Mother    Stroke Mother    Heart disease Father    Breast cancer Sister    Prostate cancer Brother    Hypertension Sister    Colon cancer Neg Hx    Esophageal cancer Neg Hx    Pancreatic cancer Neg Hx    Rectal cancer Neg Hx    Stomach cancer Neg Hx     Social History   Socioeconomic History   Marital status: Married    Spouse name: Not on file   Number of children: Not on file   Years of education: Not on file   Highest education level: Not on file  Occupational History   Occupation: retired  Tobacco Use   Smoking status: Former    Years: 58.00    Types:  Cigarettes    Quit date: 01/16/2017    Years since quitting: 4.4   Smokeless tobacco: Never  Vaping Use   Vaping Use: Never used  Substance and Sexual Activity   Alcohol use: No   Drug use: Never   Sexual activity: Not on file  Other Topics Concern   Not on file  Social History Narrative   Retired    Married       He watches television   Social Determinants of Health   Financial Resource Strain: Medium Risk   Difficulty of Paying Living Expenses: Somewhat hard  Food Insecurity: No Food Insecurity   Worried About Charity fundraiser in the Last Year: Never true   Arboriculturist in the Last Year: Never true  Transportation Needs: No Transportation Needs   Lack of Transportation (Medical): No   Lack of Transportation  (Non-Medical): No  Physical Activity: Inactive   Days of Exercise per Week: 0 days   Minutes of Exercise per Session: 0 min  Stress: No Stress Concern Present   Feeling of Stress : Not at all  Social Connections: Moderately Isolated   Frequency of Communication with Friends and Family: More than three times a week   Frequency of Social Gatherings with Friends and Family: More than three times a week   Attends Religious Services: Never   Marine scientist or Organizations: No   Attends Archivist Meetings: Never   Marital Status: Married  Human resources officer Violence: Not At Risk   Fear of Current or Ex-Partner: No   Emotionally Abused: No   Physically Abused: No   Sexually Abused: No     Physical Exam   Vitals:   06/23/21 0300 06/23/21 0445  BP: 115/64 105/63  Pulse: (!) 102 87  Resp: 20   Temp: 98.6 F (37 C)   SpO2: 95% 96%    CONSTITUTIONAL: Well-appearing, NAD NEURO/PSYCH:  Alert and oriented x 3, no focal deficits EYES:  eyes equal and reactive ENT/NECK:  no LAD, no JVD CARDIO: Regular rate, well-perfused, normal S1 and S2 PULM:  CTAB no wheezing or rhonchi GI/GU:  non-distended, non-tender MSK/SPINE:  No gross deformities, no edema SKIN:  no rash, atraumatic   *Additional and/or pertinent findings included in MDM below  Diagnostic and Interventional Summary    EKG Interpretation  Date/Time:  Saturday June 23 2021 05:06:43 EST Ventricular Rate:  88 PR Interval:  130 QRS Duration: 101 QT Interval:  355 QTC Calculation: 430 R Axis:   59 Text Interpretation: Sinus rhythm Ventricular premature complex Minimal ST elevation, inferior leads Confirmed by Gerlene Fee [phone]) on 06/23/2021 5:34:25 AM       Labs Reviewed  COMPREHENSIVE METABOLIC PANEL - Abnormal; Notable for the following components:      Result Value   Sodium 133 (*)    CO2 19 (*)    Glucose, Bld 103 (*)    BUN 52 (*)    Creatinine, Ser 2.21 (*)    Calcium 8.4 (*)     Albumin 3.0 (*)    GFR, Estimated 31 (*)    All other components within normal limits  CBC WITH DIFFERENTIAL/PLATELET - Abnormal; Notable for the following components:   WBC 12.7 (*)    RBC 3.16 (*)    Hemoglobin 8.7 (*)    HCT 27.8 (*)    RDW 16.2 (*)    Neutro Abs 12.3 (*)    Lymphs Abs 0.2 (*)    Abs  Immature Granulocytes 0.10 (*)    All other components within normal limits  URINALYSIS, ROUTINE W REFLEX MICROSCOPIC - Abnormal; Notable for the following components:   APPearance CLOUDY (*)    Hgb urine dipstick LARGE (*)    Protein, ur 100 (*)    Leukocytes,Ua MODERATE (*)    RBC / HPF >50 (*)    Bacteria, UA RARE (*)    All other components within normal limits  RESP PANEL BY RT-PCR (FLU A&B, COVID) ARPGX2  CULTURE, BLOOD (ROUTINE X 2)  CULTURE, BLOOD (ROUTINE X 2)  URINE CULTURE  LACTIC ACID, PLASMA  PROTIME-INR  APTT  LACTIC ACID, PLASMA    DG Chest Port 1 View  Final Result      Medications  cefTRIAXone (ROCEPHIN) 2 g in sodium chloride 0.9 % 100 mL IVPB (0 g Intravenous Stopped 06/23/21 0504)     Procedures  /  Critical Care Procedures  ED Course and Medical Decision Making  Initial Impression and Ddx Very well-appearing patient with normal vital signs presenting with fever after first dose of chemotherapy.  Gemcitabine infusion this past Wednesday, initiating treatment for recently diagnosed bladder cancer.  Given well-appearing nature and normal vital signs, will begin infectious work-up with labs, chest x-ray, urinalysis, holding antibiotics for now.  May be a good candidate for discharge and close follow-up if he continues to look and feel well.  Past medical/surgical history that increases complexity of ED encounter: Bladder cancer on chemotherapy  Interpretation of Diagnostics I personally reviewed the EKG and my interpretation is as follows: Sinus rhythm without significant interval changes    Labs are overall reassuring with no leukopenia, no  significant electrolyte disturbance.  Urinalysis showing evidence of infection  Patient Reassessment and Ultimate Disposition/Management Patient continues to look and feel well with no symptoms, normal vital signs, given IV ceftriaxone, appropriate for discharge with close follow-up.  Patient management required discussion with the following services or consulting groups: None  Complexity of Problems Addressed Acute illness or injury that poses threat of life of bodily function  Additional Data Reviewed and Analyzed Further history obtained from: Recent Consult notes  Additional Factors Impacting ED Encounter Risk Consideration of hospitalization  Barth Kirks. [place] Small, [place] [email or web address]  Final Clinical Impressions(s) / ED Diagnoses     ICD-10-CM   1. Acute cystitis with hematuria  N30.01       ED Discharge Orders          Ordered    cephALEXin (KEFLEX) 500 MG capsule  2 times daily        06/23/21 0554             Discharge Instructions Discussed with and Provided to Patient:     Discharge Instructions      You were evaluated in the Emergency Department and after careful evaluation, we did not find any emergent condition requiring admission or further testing in the hospital.  Your exam/testing today was overall reassuring.  Fever likely due to a urinary tract infection.  Please take the Keflex antibiotic starting tomorrow morning.  Follow-up closely with your regular doctors.  Please return to the Emergency Department if you experience any worsening of your condition.  Thank you for allowing [place] to be a part of your care.        Maudie Flakes, MD 06/23/21 [id number]

## 2021-06-23 NOTE — Discharge Instructions (Signed)
You were evaluated in the Emergency Department and after careful evaluation, we did not find any emergent condition requiring admission or further testing in the hospital.  Your exam/testing today was overall reassuring.  Fever likely due to a urinary tract infection.  Please take the Keflex antibiotic starting tomorrow morning.  Follow-up closely with your regular doctors.  Please return to the Emergency Department if you experience any worsening of your condition.  Thank you for allowing [place] to be a part of your care.

## 2021-06-26 LAB — URINE CULTURE: Culture: >=100000 — AB

## 2021-06-27 ENCOUNTER — Emergency Department (HOSPITAL_COMMUNITY)
Admission: EM | Admit: 2021-06-27 | Discharge: 2021-06-27 | Disposition: A | Discharge status: Home / Self Care | Payer: Medicare Other | Attending: Emergency Medicine | Admitting: Emergency Medicine

## 2021-06-27 ENCOUNTER — Other Ambulatory Visit: Payer: Self-pay

## 2021-06-27 ENCOUNTER — Inpatient Hospital Stay: Payer: Medicare Other

## 2021-06-27 ENCOUNTER — Telehealth: Payer: Self-pay | Admitting: Emergency Medicine

## 2021-06-27 ENCOUNTER — Encounter (HOSPITAL_COMMUNITY): Payer: Self-pay

## 2021-06-27 VITALS — BP 143/75 | HR 95 | Temp 98.2°F | Resp 16 | Wt 145.5 lb

## 2021-06-27 DIAGNOSIS — J449 Chronic obstructive pulmonary disease, unspecified: Secondary | ICD-10-CM | POA: Diagnosis not present

## 2021-06-27 DIAGNOSIS — R319 Hematuria, unspecified: Secondary | ICD-10-CM | POA: Insufficient documentation

## 2021-06-27 DIAGNOSIS — Z79899 Other long term (current) drug therapy: Secondary | ICD-10-CM | POA: Diagnosis not present

## 2021-06-27 DIAGNOSIS — C679 Malignant neoplasm of bladder, unspecified: Secondary | ICD-10-CM

## 2021-06-27 DIAGNOSIS — Z95828 Presence of other vascular implants and grafts: Secondary | ICD-10-CM

## 2021-06-27 DIAGNOSIS — I10 Essential (primary) hypertension: Secondary | ICD-10-CM | POA: Diagnosis not present

## 2021-06-27 LAB — CBC WITH DIFFERENTIAL (CANCER CENTER ONLY)
Abs Immature Granulocytes: 0.03 10*3/uL (ref 0.00–0.07)
Basophils Absolute: 0 10*3/uL (ref 0.0–0.1)
Basophils Relative: 0 %
Eosinophils Absolute: 0 10*3/uL (ref 0.0–0.5)
Eosinophils Relative: 0 %
HCT: 25.6 % — ABNORMAL LOW (ref 39.0–52.0)
Hemoglobin: 8 g/dL — ABNORMAL LOW (ref 13.0–17.0)
Immature Granulocytes: 1 %
Lymphocytes Relative: 8 %
Lymphs Abs: 0.4 10*3/uL — ABNORMAL LOW (ref 0.7–4.0)
MCH: 26.3 pg (ref 26.0–34.0)
MCHC: 31.3 g/dL (ref 30.0–36.0)
MCV: 84.2 fL (ref 80.0–100.0)
Monocytes Absolute: 0.2 10*3/uL (ref 0.1–1.0)
Monocytes Relative: 4 %
Neutro Abs: 4.3 10*3/uL (ref 1.7–7.7)
Neutrophils Relative %: 87 %
Platelet Count: 199 10*3/uL (ref 150–400)
RBC: 3.04 MIL/uL — ABNORMAL LOW (ref 4.22–5.81)
RDW: 15.8 % — ABNORMAL HIGH (ref 11.5–15.5)
WBC Count: 4.9 10*3/uL (ref 4.0–10.5)
nRBC: 0 % (ref 0.0–0.2)

## 2021-06-27 LAB — CMP (CANCER CENTER ONLY)
ALT: 30 U/L (ref 0–44)
AST: 35 U/L (ref 15–41)
Albumin: 3.3 g/dL — ABNORMAL LOW (ref 3.5–5.0)
Alkaline Phosphatase: 71 U/L (ref 38–126)
Anion gap: 10 (ref 5–15)
BUN: 36 mg/dL — ABNORMAL HIGH (ref 8–23)
CO2: 22 mmol/L (ref 22–32)
Calcium: 8.4 mg/dL — ABNORMAL LOW (ref 8.9–10.3)
Chloride: 104 mmol/L (ref 98–111)
Creatinine: 1.91 mg/dL — ABNORMAL HIGH (ref 0.61–1.24)
GFR, Estimated: 37 mL/min — ABNORMAL LOW (ref >60)
Glucose, Bld: 132 mg/dL — ABNORMAL HIGH (ref 70–99)
Potassium: 4 mmol/L (ref 3.5–5.1)
Sodium: 136 mmol/L (ref 135–145)
Total Bilirubin: 0.3 mg/dL (ref 0.3–1.2)
Total Protein: 6.7 g/dL (ref 6.5–8.1)

## 2021-06-27 MED ORDER — SODIUM CHLORIDE 0.9% FLUSH
10.0000 mL | INTRAVENOUS | Status: AC | PRN
  Administered 2021-06-27: 10 mL

## 2021-06-27 MED ORDER — HEPARIN SOD (PORK) LOCK FLUSH 100 UNIT/ML IV SOLN
500.0000 [IU] | Freq: Once | INTRAVENOUS | Status: AC | PRN
  Administered 2021-06-27: 500 [IU]

## 2021-06-27 MED ORDER — SODIUM CHLORIDE 0.9% FLUSH
10.0000 mL | INTRAVENOUS | Status: DC | PRN
  Administered 2021-06-27: 10 mL

## 2021-06-27 MED ORDER — SODIUM CHLORIDE 0.9 % IV SOLN: Freq: Once | INTRAVENOUS | Status: AC

## 2021-06-27 MED ORDER — SODIUM CHLORIDE 0.9 % IV SOLN
1000.0000 mg/m2 | Freq: Once | INTRAVENOUS | Status: AC
  Administered 2021-06-27: 1748 mg via INTRAVENOUS
  Filled 2021-06-27: qty 45.97

## 2021-06-27 MED ORDER — PROCHLORPERAZINE MALEATE 10 MG PO TABS
10.0000 mg | ORAL_TABLET | Freq: Once | ORAL | Status: AC
  Administered 2021-06-27: 10 mg via ORAL
  Filled 2021-06-27: qty 1

## 2021-06-27 NOTE — Progress Notes (Addendum)
ED Antimicrobial Stewardship Positive Culture Follow Up   Samuel [name] is an 71 y.o. male who presented to [facility] on 06/23/2021 with a chief complaint of  Chief Complaint  Patient presents with   Fever    Recent Results (from the past 720 hour(s))  Blood Culture (routine x 2)     Status: None (Preliminary result)   Collection Time: 06/23/21  3:13 AM   Specimen: BLOOD  Result Value Ref Range Status   Specimen Description   Final    BLOOD RIGHT ANTECUBITAL Performed at [facility] [address] [place], [place] [address]    Special Requests   Final    BOTTLES DRAWN AEROBIC AND ANAEROBIC Blood Culture adequate volume Performed at [facility] [address], [place], [place] [address]    Culture   Final    NO GROWTH 4 DAYS Performed at [facility], [place] [address], [place], [place] [address]    Report Status PENDING  Incomplete  Urine Culture     Status: Abnormal   Collection Time: 06/23/21  3:13 AM   Specimen: In/Out Cath Urine  Result Value Ref Range Status   Specimen Description   Final    IN/OUT CATH URINE Performed at [facility] [address] [place], [place] [address]    Special Requests   Final    NONE Performed at [facility], [place] [address] [place], [place] [address]    Culture (A)  Final    >=100,000 COLONIES/mL STAPHYLOCOCCUS LUGDUNENSIS Two isolates with different morphologies were identified as the same organism.The most resistant organism was reported. Performed at [facility], [place] [address] [place], [place] [address]    Report Status 06/26/2021 FINAL  Final   Organism ID, Bacteria STAPHYLOCOCCUS LUGDUNENSIS (A)  Final      Susceptibility   Staphylococcus lugdunensis - MIC*    CIPROFLOXACIN <=0.5 SENSITIVE Sensitive     GENTAMICIN <=0.5 SENSITIVE Sensitive     NITROFURANTOIN <=16 SENSITIVE Sensitive     OXACILLIN RESISTANT Resistant      TETRACYCLINE <=1 SENSITIVE Sensitive     VANCOMYCIN <=0.5 SENSITIVE Sensitive     TRIMETH/SULFA <=10 SENSITIVE Sensitive     CLINDAMYCIN <=0.25 SENSITIVE Sensitive     RIFAMPIN <=0.5 SENSITIVE Sensitive     Inducible Clindamycin NEGATIVE Sensitive     * >=100,000 COLONIES/mL STAPHYLOCOCCUS LUGDUNENSIS  Resp Panel by RT-PCR (Flu A&B, Covid) Nasopharyngeal Swab     Status: None   Collection Time: 06/23/21  3:13 AM   Specimen: Nasopharyngeal Swab; Nasopharyngeal(NP) swabs in vial transport medium  Result Value Ref Range Status   SARS Coronavirus 2 by RT PCR NEGATIVE NEGATIVE Final    Comment: (NOTE) SARS-CoV-2 target nucleic acids are NOT DETECTED.  The SARS-CoV-2 RNA is generally detectable in upper respiratory specimens during the acute phase of infection. The lowest concentration of SARS-CoV-2 viral copies this assay can detect is 138 copies/mL. A negative result does not preclude SARS-Cov-2 infection and should not be used as the sole basis for treatment or other patient management decisions. A negative result may occur with  improper specimen collection/handling, submission of specimen other than nasopharyngeal swab, presence of viral mutation(s) within the areas targeted by this assay, and inadequate number of viral copies(<138 copies/mL). A negative result must be combined with clinical observations, patient history, and epidemiological information. The expected result is Negative.  Fact Sheet for Patients:  EntrepreneurPulse.com.au  Fact Sheet  for Healthcare Providers:  IncredibleEmployment.be  This test is no t yet approved or cleared by the [place] and  has been authorized for detection and/or diagnosis of SARS-CoV-2 by FDA under an Emergency Use Authorization (EUA). This EUA will remain  in effect (meaning this test can be used) for the duration of the COVID-19 declaration under Section 564(b)(1) of the Act, 21 U.S.C.section  360bbb-3(b)(1), unless the authorization is terminated  or revoked sooner.       Influenza A by PCR NEGATIVE NEGATIVE Final   Influenza B by PCR NEGATIVE NEGATIVE Final    Comment: (NOTE) The Xpert Xpress SARS-CoV-2/FLU/RSV plus assay is intended as an aid in the diagnosis of influenza from Nasopharyngeal swab specimens and should not be used as a sole basis for treatment. Nasal washings and aspirates are unacceptable for Xpert Xpress SARS-CoV-2/FLU/RSV testing.  Fact Sheet for Patients: EntrepreneurPulse.com.au  Fact Sheet for Healthcare Providers: IncredibleEmployment.be  This test is not yet approved or cleared by the [place] FDA and has been authorized for detection and/or diagnosis of SARS-CoV-2 by FDA under an Emergency Use Authorization (EUA). This EUA will remain in effect (meaning this test can be used) for the duration of the COVID-19 declaration under Section 564(b)(1) of the Act, 21 U.S.C. section 360bbb-3(b)(1), unless the authorization is terminated or revoked.  Performed at [facility], [place] [address] [place], [place] [address]     [x]  Treated with Cephalexin 500mg  by mouth 2 times daily for 7 days , organism resistant to prescribed antimicrobial []  Patient discharged originally without antimicrobial agent and treatment is now indicated  New antibiotic prescription: Bactrim 1 SS dose by mouth twice daily for 7 days  ED Provider: Dene Gentry, MD   Elpidio Anis, PharmD Candidate [phone] 06/27/2021, 10:50 AM Clinical Pharmacist Monday - Friday phone -  [phone] Saturday - Sunday phone - [phone]    Jimmy Footman, PharmD, BCPS, [facility] Clinical Pharmacist Phone: [phone] 06/27/2021 10:56 AM

## 2021-06-27 NOTE — ED Provider Triage Note (Signed)
Emergency Medicine Provider Triage Evaluation Note  Samuel [name] , a 71 y.o. male  was evaluated in triage.  Pt with history of bladder cancer currently undergoing chemotherapy who presents with concern for leaking from his left nephrostomy tube.  According to the patient he states he feels fine and does not think he needs to be here by his wife at bedside states that he has been having "more blood in his urine than his normal" in his nephrostomy tube drains.  He does chronically have hematuria, but his wife feels this is increased from his baseline.  Diagnosed with  urinary tract infection on 06/23/2021 and started on cephalexin but was called today and informed that the bacteria that grew from his culture is resistant to cephalexin and he was changed to Bactrim.  He has not yet started this medication.  He was having fevers over the weekend but has been afebrile today.  Review of Systems  Positive: Hematuria Negative: fever  Physical Exam  BP (!) 143/70    Pulse 92    Temp 97.8 F (36.6 C) (Oral)    Resp 18    SpO2 95%  Gen:   Awake, no distress   Resp:  Normal effort  MSK:   Moves extremities without difficulty  Other:  Seen hematuria and left nephrostomy tube drain.  No gross hematuria and right nephrostomy tube drain.  Cardiopulmonary and abdominal exams are benign.  Medical Decision Making  Medically screening exam initiated at 3:43 PM.  Appropriate orders placed.  [name] was informed that the remainder of the evaluation will be completed by another provider, this initial triage assessment does not replace that evaluation, and the importance of remaining in the ED until their evaluation is complete.  Will obtain repeat UA and urine culture per oncology provider note sent in hand with the patient from cancer center.  No repeat labs as patient had labs At 1230 this afternoon.  CBC with hemoglobin of 8 near his baseline.  No leukocytosis.  CMP with baseline  creatinine of 1.9.  This chart was dictated using voice recognition software, Dragon. Despite the best efforts of this provider to proofread and correct errors, errors may still occur which can change documentation meaning.    Emeline Darling, PA-C 06/27/21 1547

## 2021-06-27 NOTE — Patient Instructions (Signed)
[facility]   Discharge Instructions: Thank you for choosing [facility] to provide your oncology and hematology care.   If you have a lab appointment with the Cancer Center, please go directly to the Cancer Center and check in at the registration area.   Wear comfortable clothing and clothing appropriate for easy access to any Portacath or PICC line.   We strive to give you quality time with your provider. You may need to reschedule your appointment if you arrive late (15 or more minutes).  Arriving late affects you and other patients whose appointments are after yours.  Also, if you miss three or more appointments without notifying the office, you may be dismissed from the clinic at the provider's discretion.      For prescription refill requests, have your pharmacy contact our office and allow 72 hours for refills to be completed.    Today you received the following chemotherapy and/or immunotherapy agents: Gemcitabine (Gemzar)      To help prevent nausea and vomiting after your treatment, we encourage you to take your nausea medication as directed.  BELOW ARE SYMPTOMS THAT SHOULD BE REPORTED IMMEDIATELY: *FEVER GREATER THAN 100.4 F (38 C) OR HIGHER *CHILLS OR SWEATING *NAUSEA AND VOMITING THAT IS NOT CONTROLLED WITH YOUR NAUSEA MEDICATION *UNUSUAL SHORTNESS OF BREATH *UNUSUAL BRUISING OR BLEEDING *URINARY PROBLEMS (pain or burning when urinating, or frequent urination) *BOWEL PROBLEMS (unusual diarrhea, constipation, pain near the anus) TENDERNESS IN MOUTH AND THROAT WITH OR WITHOUT PRESENCE OF ULCERS (sore throat, sores in mouth, or a toothache) UNUSUAL RASH, SWELLING OR PAIN  UNUSUAL VAGINAL DISCHARGE OR ITCHING   Items with * indicate a potential emergency and should be followed up as soon as possible or go to the Emergency Department if any problems should occur.  Please show the CHEMOTHERAPY ALERT CARD or IMMUNOTHERAPY ALERT CARD at  check-in to the Emergency Department and triage nurse.  Should you have questions after your visit or need to cancel or reschedule your appointment, please contact [facility]  Dept: [phone]  and follow the prompts.  Office hours are 8:00 a.m. to 4:30 p.m. Monday - Friday. Please note that voicemails left after 4:00 p.m. may not be returned until the following business day.  We are closed weekends and major holidays. You have access to a nurse at all times for urgent questions. Please call the main number to the clinic Dept: [phone] and follow the prompts.   For any non-urgent questions, you may also contact your provider using MyChart. We now offer e-Visits for anyone 18 and older to request care online for non-urgent symptoms. For details visit [email or web address].   Also download the MyChart app! Go to the app store, search "MyChart", open the app, select [facility], and log in with your MyChart username and password.  Due to Covid, a mask is required upon entering the hospital/clinic. If you do not have a mask, one will be given to you upon arrival. For doctor visits, patients may have 1 support person aged 18 or older with them. For treatment visits, patients cannot have anyone with them due to current Covid guidelines and our immunocompromised population.   

## 2021-06-27 NOTE — Telephone Encounter (Signed)
Post ED Visit - Positive Culture Follow-up: Successful Patient Follow-Up  Culture assessed and recommendations reviewed by:  []  Elenor Quinones, Pharm.D. []  Heide Guile, Pharm.D., BCPS AQ-ID []  Parks Neptune, Pharm.D., BCPS []  Alycia Rossetti, Pharm.D., BCPS []  [place], Pharm.D., BCPS, AAHIVP []  Legrand Como, Pharm.D., BCPS, AAHIVP []  Salome Arnt, PharmD, BCPS []  Johnnette Gourd, PharmD, BCPS []  Hughes Better, PharmD, BCPS []  Leeroy Cha, PharmD Jimmy Footman PharmD  Positive urine culture  []  Patient discharged without antimicrobial prescription and treatment is now indicated [x]  Organism is resistant to prescribed ED discharge antimicrobial []  Patient with positive blood cultures  Changes discussed with ED provider: Dr Dene Gentry New antibiotic prescription: stop cephalexin and start Bactrim SS one 400/80mg  po twice daily x 7 days #14 Attempting to contact patient     Hazle Nordmann 06/27/2021, 11:48 AM

## 2021-06-27 NOTE — ED Triage Notes (Signed)
Pt arrived via POV, states had had blood in nephrostomy tube, recently dx with cystitis. Abx changed today due to resistance.

## 2021-06-27 NOTE — Discharge Instructions (Addendum)
Please call Dr. Johny Shears office today to schedule follow-up appointment and request appointment sooner than March 7 for ER follow-up.  Inform them you have been seen in the ER twice no concern for increased bleeding from your nephrostomy tubes with stable labs today.  Return to the ER with any significant increase in your bleeding, worsening lightheadedness, dizziness, or any other new severe symptoms.  Please start taking the new antibiotic that was prescribed to you today.

## 2021-06-27 NOTE — ED Provider Notes (Signed)
[facility] DEPT Provider Note   CSN: [id number] Arrival date & time: 06/27/21  1457     History  No chief complaint on file.   Samuel [name] is a 71 y.o. male currently on chemotherapy for bladder cancer with bilateral nephrostomy tubes recently replaced who presents with concern for reported increase in his hematuria output as well as mild leaking around the left nephrostomy tube.  Patient was seen in the cancer center this morning and labs taken and discussed with oncologist.  CBC without leukocytosis and with mild increase in anemia with hemoglobin of 8, baseline closer to 8.8.  Oncologist was aware and patient did receive chemotherapy today.  Of note patient was seen on 06/23/2020 and diagnosed with UTI and started on Keflex.  Was called by ED provider this morning for change in antibiotics to Bactrim given concern for resistance of urine bacteria growth to cephalexin.  Patient states that he understands this and is agreeable to picking up the new antibiotic in the outpatient setting.  Patient states he does not want to be in the emergency department but his wife is concerned that he may have had an increase in blood in his nephrostomy output from his baseline.  Patient endorses some episodes of shortness of breath or fatigue with exertion but states he is feeling well at this time.  Is scheduled to follow-up with urologist on 3/7.  I personally reviewed this patient's medical records.  He has history of hypertension, COPD, anxiety and depression as well as hyperlipidemia.  He is not anticoagulated.  HPI     Home Medications Prior to Admission medications   Medication Sig Start Date End Date Taking? Authorizing Provider  acetaminophen (TYLENOL) 325 MG tablet Take 650 mg by mouth every 6 (six) hours as needed for moderate pain.    [provider]  albuterol (PROVENTIL) (2.5 MG/3ML) 0.083% nebulizer solution Take 2.5 mg by  nebulization in the morning and at bedtime.    [provider]  albuterol (VENTOLIN HFA) 108 (90 Base) MCG/ACT inhaler Inhale 2 puffs into the lungs every 6 (six) hours as needed for wheezing or shortness of breath. 11/08/20   Freddi Starr, MD  amLODipine (NORVASC) 10 MG tablet Take 10 mg by mouth at bedtime. 06/05/20   [provider]  aspirin EC 81 MG tablet Take 1 tablet (81 mg total) by mouth daily. Swallow whole. 04/17/21   Early Osmond, MD  atorvastatin (LIPITOR) 40 MG tablet Take 1 tablet (40 mg total) by mouth daily. 04/10/21   [name], [name], NP  Budeson-Glycopyrrol-Formoterol (BREZTRI AEROSPHERE) 160-9-4.8 MCG/ACT AERO Inhale 2 puffs into the lungs in the morning and at bedtime. 03/27/21   [name], [name], NP  cephALEXin (KEFLEX) 500 MG capsule Take 1 capsule (500 mg total) by mouth 2 (two) times daily for 7 days. 06/23/21 06/30/21  Maudie Flakes, MD  Cholecalciferol (VITAMIN D3 PO) Take 1 tablet by mouth daily.    [provider]  citalopram (CELEXA) 20 MG tablet Take 1 tablet (20 mg total) by mouth daily. Patient taking differently: Take 20 mg by mouth at bedtime. 03/27/21   [name], [name], NP  Cyanocobalamin (B-12 PO) Take 1 capsule by mouth daily.    [provider]  finasteride (PROSCAR) 5 MG tablet Take 5 mg by mouth daily. 05/15/20   Alexis Frock, MD  lidocaine-prilocaine (EMLA) cream Apply 1 application topically as needed. 06/07/21   Wyatt Portela, MD  metoprolol succinate (TOPROL-XL) 25 MG  24 hr tablet Take 25 mg by mouth daily.    [provider]  oxyCODONE (OXY IR/ROXICODONE) 5 MG immediate release tablet Take 1 tablet (5 mg total) by mouth every 6 (six) hours as needed for moderate pain or severe pain (Post-operatively). 05/09/21   Alexis Frock, MD  OXYGEN Inhale 2 L into the lungs See admin instructions. 2L when taking a nap and at bedtime    [provider]  prochlorperazine (COMPAZINE) 10 MG tablet TAKE 1  TABLET(10 MG) BY MOUTH EVERY 6 HOURS AS NEEDED FOR NAUSEA OR VOMITING 06/11/21   Wyatt Portela, MD  tamsulosin (FLOMAX) 0.4 MG CAPS capsule Take 2 capsules by mouth at bedtime. 04/21/20   [provider]      Allergies    Penicillins    Review of Systems   Review of Systems  Constitutional: Negative.   HENT: Negative.    Respiratory: Negative.    Cardiovascular: Negative.   Gastrointestinal: Negative.   Genitourinary:  Positive for hematuria.  Musculoskeletal: Negative.   Neurological: Negative.   Hematological: Negative.   Psychiatric/Behavioral: Negative.     Physical Exam Updated Vital Signs BP (!) 143/70    Pulse 92    Temp 97.8 F (36.6 C) (Oral)    Resp 18    SpO2 95%  Physical Exam Vitals and nursing note reviewed.  HENT:     Head: Normocephalic and atraumatic.     Mouth/Throat:     Mouth: Mucous membranes are moist.     Pharynx: No oropharyngeal exudate or posterior oropharyngeal erythema.  Eyes:     General:        Right eye: No discharge.        Left eye: No discharge.     Extraocular Movements: Extraocular movements intact.     Conjunctiva/sclera: Conjunctivae normal.     Pupils: Pupils are equal, round, and reactive to light.  Cardiovascular:     Rate and Rhythm: Normal rate and regular rhythm.     Pulses: Normal pulses.  Pulmonary:     Effort: Pulmonary effort is normal. No respiratory distress.     Breath sounds: Normal breath sounds. No wheezing or rales.  Abdominal:     General: Bowel sounds are normal. There is no distension.     Palpations: Abdomen is soft.     Tenderness: There is no abdominal tenderness. There is no right CVA tenderness, left CVA tenderness, guarding or rebound.  Musculoskeletal:        General: No deformity.       Arms:     Cervical back: Neck supple.  Skin:    General: Skin is warm and dry.     Capillary Refill: Capillary refill takes less than 2 seconds.  Neurological:     General: No focal deficit present.      Mental Status: He is alert and oriented to person, place, and time. Mental status is at baseline.  Psychiatric:        Mood and Affect: Mood normal.    ED Results / Procedures / Treatments   Labs (all labs ordered are listed, but only abnormal results are displayed) Labs Reviewed  URINE CULTURE  URINALYSIS, ROUTINE W REFLEX MICROSCOPIC    EKG None  Radiology No results found.  Procedures Procedures    Medications Ordered in ED Medications - No data to display  ED Course/ Medical Decision Making/ A&P  Medical Decision Making 71 year old male with chronic hematuria secondary to nephrostomy tubes and bladder cancer who presents with concern for possible increase in hematuria from baseline.  Mildly hypertensive on intake and vital signs otherwise normal.  Cardiopulmonary and abdominal exams are benign.  Patient with bilateral nephrostomy tubes with scant serous discharge around left nephrostomy without purulent or bloody discharge.  Blood-tinged urine present in left nephrostomy bag without hemorrhage.  No gross hematuria visualized in urine left nephrostomy drain.  Amount and/or Complexity of Data Reviewed Labs: ordered.    Details: Patient refused repeat urine sample.   Given reassuring labs at the hematologist today, without indication for acute transfusion, as well as reassuring vital signs and patient was asymptomatic at this time do not feel any further work-up is warranted near this time.  Repeat urine specimen offered as patient's wife stated that the oncologist wanted a repeat urine sample but patient declined.  He voiced understanding of his instructions to switch his antibiotic to Bactrim and follow-up closely with his urologist.  Clinical concern for acute underlying emergent etiology that would warrant further ED work-up or inpatient management is exceedingly low.  No evidence of gross hemorrhage from nephrostomy tubes today.  Laban voiced  understanding of his medical evaluation and treatment plan.  Each of his questions was answered to his expressed satisfaction.  Case discussed with attending physician.  Patient is well-appearing, stable, and was discharged in good condition.  This chart was dictated using voice recognition software, Dragon. Despite the best efforts of this provider to proofread and correct errors, errors may still occur which can change documentation meaning.   This chart was dictated using voice recognition software, Dragon. Despite the best efforts of this provider to proofread and correct errors, errors may still occur which can change documentation meaning.  Final Clinical Impression(s) / ED Diagnoses Final diagnoses:  Hematuria, unspecified type    Rx / DC Orders ED Discharge Orders     None         Aura Dials 06/27/21 2019    Luna Fuse, MD 07/01/21 [phone]

## 2021-06-27 NOTE — Progress Notes (Signed)
Per Dr. Julien Nordmann - okay to treat with hemoglobin of 8 and creatinine of 1.91. MD made aware of new s/s and recent ED visit.

## 2021-06-28 DIAGNOSIS — N3000 Acute cystitis without hematuria: Secondary | ICD-10-CM | POA: Diagnosis not present

## 2021-06-28 LAB — CULTURE, BLOOD (ROUTINE X 2)
Culture: NO GROWTH
Special Requests: ADEQUATE

## 2021-06-29 ENCOUNTER — Encounter: Payer: Self-pay | Admitting: Oncology

## 2021-06-29 NOTE — Progress Notes (Signed)
Received call from spouse regarding received my card.  Introduced myself as Arboriculturist and to offer available resources.  Discussed one-time $1000 Radio broadcast assistant to assist with personal expenses while going through treatment. Advised what is needed to apply and agreed to bring at next visit on 07/11/21.  Also discussed possible copay assistance and advised what would be needed if any available. She verbalized understanding.  They have my card for any additional financial questions or concerns.

## 2021-07-06 ENCOUNTER — Other Ambulatory Visit: Payer: Self-pay

## 2021-07-06 ENCOUNTER — Emergency Department (HOSPITAL_COMMUNITY): Payer: Medicare Other

## 2021-07-06 ENCOUNTER — Encounter (HOSPITAL_COMMUNITY): Payer: Self-pay

## 2021-07-06 ENCOUNTER — Inpatient Hospital Stay (HOSPITAL_COMMUNITY)
Admission: EM | Admit: 2021-07-06 | Discharge: 2021-07-09 | DRG: 699 | Disposition: A | Discharge status: Home / Self Care | Payer: Medicare Other | Attending: Internal Medicine | Admitting: Internal Medicine

## 2021-07-06 DIAGNOSIS — I10 Essential (primary) hypertension: Secondary | ICD-10-CM | POA: Diagnosis not present

## 2021-07-06 DIAGNOSIS — Y846 Urinary catheterization as the cause of abnormal reaction of the patient, or of later complication, without mention of misadventure at the time of the procedure: Secondary | ICD-10-CM | POA: Diagnosis present

## 2021-07-06 DIAGNOSIS — R509 Fever, unspecified: Secondary | ICD-10-CM

## 2021-07-06 DIAGNOSIS — D72819 Decreased white blood cell count, unspecified: Secondary | ICD-10-CM

## 2021-07-06 DIAGNOSIS — R197 Diarrhea, unspecified: Secondary | ICD-10-CM | POA: Diagnosis not present

## 2021-07-06 DIAGNOSIS — N179 Acute kidney failure, unspecified: Secondary | ICD-10-CM | POA: Diagnosis not present

## 2021-07-06 DIAGNOSIS — T451X5A Adverse effect of antineoplastic and immunosuppressive drugs, initial encounter: Secondary | ICD-10-CM | POA: Diagnosis present

## 2021-07-06 DIAGNOSIS — J449 Chronic obstructive pulmonary disease, unspecified: Secondary | ICD-10-CM | POA: Diagnosis present

## 2021-07-06 DIAGNOSIS — D630 Anemia in neoplastic disease: Secondary | ICD-10-CM | POA: Diagnosis present

## 2021-07-06 DIAGNOSIS — Z20822 Contact with and (suspected) exposure to covid-19: Secondary | ICD-10-CM | POA: Diagnosis not present

## 2021-07-06 DIAGNOSIS — F32A Depression, unspecified: Secondary | ICD-10-CM | POA: Diagnosis present

## 2021-07-06 DIAGNOSIS — D709 Neutropenia, unspecified: Secondary | ICD-10-CM | POA: Diagnosis present

## 2021-07-06 DIAGNOSIS — N138 Other obstructive and reflux uropathy: Secondary | ICD-10-CM | POA: Diagnosis not present

## 2021-07-06 DIAGNOSIS — D631 Anemia in chronic kidney disease: Secondary | ICD-10-CM | POA: Diagnosis not present

## 2021-07-06 DIAGNOSIS — Z7982 Long term (current) use of aspirin: Secondary | ICD-10-CM

## 2021-07-06 DIAGNOSIS — Z8249 Family history of ischemic heart disease and other diseases of the circulatory system: Secondary | ICD-10-CM

## 2021-07-06 DIAGNOSIS — N1832 Chronic kidney disease, stage 3b: Secondary | ICD-10-CM | POA: Diagnosis present

## 2021-07-06 DIAGNOSIS — D649 Anemia, unspecified: Secondary | ICD-10-CM | POA: Insufficient documentation

## 2021-07-06 DIAGNOSIS — K920 Hematemesis: Secondary | ICD-10-CM | POA: Diagnosis present

## 2021-07-06 DIAGNOSIS — C679 Malignant neoplasm of bladder, unspecified: Secondary | ICD-10-CM | POA: Diagnosis present

## 2021-07-06 DIAGNOSIS — Z7951 Long term (current) use of inhaled steroids: Secondary | ICD-10-CM

## 2021-07-06 DIAGNOSIS — J432 Centrilobular emphysema: Secondary | ICD-10-CM | POA: Diagnosis present

## 2021-07-06 DIAGNOSIS — I129 Hypertensive chronic kidney disease with stage 1 through stage 4 chronic kidney disease, or unspecified chronic kidney disease: Secondary | ICD-10-CM | POA: Diagnosis not present

## 2021-07-06 DIAGNOSIS — R319 Hematuria, unspecified: Secondary | ICD-10-CM | POA: Diagnosis present

## 2021-07-06 DIAGNOSIS — Z936 Other artificial openings of urinary tract status: Secondary | ICD-10-CM

## 2021-07-06 DIAGNOSIS — F419 Anxiety disorder, unspecified: Secondary | ICD-10-CM | POA: Diagnosis present

## 2021-07-06 DIAGNOSIS — N390 Urinary tract infection, site not specified: Secondary | ICD-10-CM | POA: Diagnosis not present

## 2021-07-06 DIAGNOSIS — Z9981 Dependence on supplemental oxygen: Secondary | ICD-10-CM | POA: Diagnosis not present

## 2021-07-06 DIAGNOSIS — M1990 Unspecified osteoarthritis, unspecified site: Secondary | ICD-10-CM | POA: Diagnosis present

## 2021-07-06 DIAGNOSIS — G4736 Sleep related hypoventilation in conditions classified elsewhere: Secondary | ICD-10-CM | POA: Diagnosis present

## 2021-07-06 DIAGNOSIS — Z66 Do not resuscitate: Secondary | ICD-10-CM | POA: Diagnosis not present

## 2021-07-06 DIAGNOSIS — Z87891 Personal history of nicotine dependence: Secondary | ICD-10-CM

## 2021-07-06 DIAGNOSIS — N401 Enlarged prostate with lower urinary tract symptoms: Secondary | ICD-10-CM | POA: Diagnosis present

## 2021-07-06 DIAGNOSIS — J9811 Atelectasis: Secondary | ICD-10-CM | POA: Diagnosis not present

## 2021-07-06 DIAGNOSIS — E785 Hyperlipidemia, unspecified: Secondary | ICD-10-CM | POA: Diagnosis present

## 2021-07-06 DIAGNOSIS — Z466 Encounter for fitting and adjustment of urinary device: Secondary | ICD-10-CM | POA: Diagnosis not present

## 2021-07-06 DIAGNOSIS — J439 Emphysema, unspecified: Secondary | ICD-10-CM | POA: Diagnosis present

## 2021-07-06 DIAGNOSIS — R1110 Vomiting, unspecified: Secondary | ICD-10-CM | POA: Diagnosis not present

## 2021-07-06 DIAGNOSIS — T83512A Infection and inflammatory reaction due to nephrostomy catheter, initial encounter: Principal | ICD-10-CM | POA: Diagnosis present

## 2021-07-06 DIAGNOSIS — Z880 Allergy status to penicillin: Secondary | ICD-10-CM

## 2021-07-06 DIAGNOSIS — Z720 Tobacco use: Secondary | ICD-10-CM | POA: Diagnosis present

## 2021-07-06 LAB — COMPREHENSIVE METABOLIC PANEL
ALT: 162 U/L — ABNORMAL HIGH (ref 0–44)
AST: 138 U/L — ABNORMAL HIGH (ref 15–41)
Albumin: 3.2 g/dL — ABNORMAL LOW (ref 3.5–5.0)
Alkaline Phosphatase: 83 U/L (ref 38–126)
Anion gap: 12 (ref 5–15)
BUN: 37 mg/dL — ABNORMAL HIGH (ref 8–23)
CO2: 21 mmol/L — ABNORMAL LOW (ref 22–32)
Calcium: 8.6 mg/dL — ABNORMAL LOW (ref 8.9–10.3)
Chloride: 101 mmol/L (ref 98–111)
Creatinine, Ser: 2.8 mg/dL — ABNORMAL HIGH (ref 0.61–1.24)
GFR, Estimated: 23 mL/min — ABNORMAL LOW (ref >60)
Glucose, Bld: 115 mg/dL — ABNORMAL HIGH (ref 70–99)
Potassium: 3.8 mmol/L (ref 3.5–5.1)
Sodium: 134 mmol/L — ABNORMAL LOW (ref 135–145)
Total Bilirubin: 0.3 mg/dL (ref 0.3–1.2)
Total Protein: 7.8 g/dL (ref 6.5–8.1)

## 2021-07-06 LAB — CBC
HCT: 27.3 % — ABNORMAL LOW (ref 39.0–52.0)
Hemoglobin: 8.6 g/dL — ABNORMAL LOW (ref 13.0–17.0)
MCH: 26.5 pg (ref 26.0–34.0)
MCHC: 31.5 g/dL (ref 30.0–36.0)
MCV: 84 fL (ref 80.0–100.0)
Platelets: 158 10*3/uL (ref 150–400)
RBC: 3.25 MIL/uL — ABNORMAL LOW (ref 4.22–5.81)
RDW: 15.5 % (ref 11.5–15.5)
WBC: 1.5 10*3/uL — ABNORMAL LOW (ref 4.0–10.5)
nRBC: 1.3 % — ABNORMAL HIGH (ref 0.0–0.2)

## 2021-07-06 LAB — URINALYSIS, ROUTINE W REFLEX MICROSCOPIC
Bilirubin Urine: NEGATIVE
Bilirubin Urine: NEGATIVE
Glucose, UA: 50 mg/dL — AB
Glucose, UA: NEGATIVE mg/dL
Ketones, ur: NEGATIVE mg/dL
Ketones, ur: NEGATIVE mg/dL
Leukocytes,Ua: NEGATIVE
Nitrite: NEGATIVE
Nitrite: NEGATIVE
Protein, ur: 100 mg/dL — AB
Protein, ur: >=300 mg/dL — AB
RBC / HPF: >50 RBC/hpf — ABNORMAL HIGH (ref 0–5)
Specific Gravity, Urine: 1.011 (ref 1.005–1.030)
Specific Gravity, Urine: 1.017 (ref 1.005–1.030)
pH: 5 (ref 5.0–8.0)
pH: 5 (ref 5.0–8.0)

## 2021-07-06 LAB — DIFFERENTIAL
Abs Immature Granulocytes: 0 10*3/uL (ref 0.00–0.07)
Band Neutrophils: 2 %
Basophils Absolute: 0 10*3/uL (ref 0.0–0.1)
Basophils Relative: 0 %
Eosinophils Absolute: 0 10*3/uL (ref 0.0–0.5)
Eosinophils Relative: 0 %
Lymphocytes Relative: 12 %
Lymphs Abs: 0.2 10*3/uL — ABNORMAL LOW (ref 0.7–4.0)
Metamyelocytes Relative: 1 %
Monocytes Absolute: 0.1 10*3/uL (ref 0.1–1.0)
Monocytes Relative: 4 %
Neutro Abs: 1.2 10*3/uL — ABNORMAL LOW (ref 1.7–7.7)
Neutrophils Relative %: 81 %

## 2021-07-06 LAB — RESP PANEL BY RT-PCR (FLU A&B, COVID) ARPGX2
Influenza A by PCR: NEGATIVE
Influenza B by PCR: NEGATIVE
SARS Coronavirus 2 by RT PCR: NEGATIVE

## 2021-07-06 LAB — LACTIC ACID, PLASMA: Lactic Acid, Venous: 1.6 mmol/L (ref 0.5–1.9)

## 2021-07-06 LAB — BRAIN NATRIURETIC PEPTIDE: B Natriuretic Peptide: 55.7 pg/mL (ref 0.0–100.0)

## 2021-07-06 LAB — TROPONIN I (HIGH SENSITIVITY): Troponin I (High Sensitivity): 6 ng/L (ref <18)

## 2021-07-06 MED ORDER — BOOST / RESOURCE BREEZE PO LIQD CUSTOM
1.0000 | Freq: Three times a day (TID) | ORAL | Status: DC
Start: 2021-07-07 — End: 2021-07-08
  Administered 2021-07-07: 1 via ORAL

## 2021-07-06 MED ORDER — SODIUM CHLORIDE 0.9 % IV SOLN
2.0000 g | Freq: Once | INTRAVENOUS | Status: AC
  Administered 2021-07-06: 2 g via INTRAVENOUS
  Filled 2021-07-06: qty 2

## 2021-07-06 MED ORDER — OXYCODONE HCL 5 MG PO TABS: 5.0000 mg | ORAL_TABLET | Freq: Four times a day (QID) | ORAL | Status: DC | PRN

## 2021-07-06 MED ORDER — ASPIRIN EC 81 MG PO TBEC
81.0000 mg | DELAYED_RELEASE_TABLET | Freq: Every day | ORAL | Status: DC
  Administered 2021-07-07 – 2021-07-09 (×3): 81 mg via ORAL
  Filled 2021-07-06 (×3): qty 1

## 2021-07-06 MED ORDER — METOPROLOL SUCCINATE ER 50 MG PO TB24
25.0000 mg | ORAL_TABLET | Freq: Every day | ORAL | Status: DC
  Administered 2021-07-08 – 2021-07-09 (×2): 25 mg via ORAL
  Filled 2021-07-06 (×3): qty 1

## 2021-07-06 MED ORDER — BUDESON-GLYCOPYRROL-FORMOTEROL 160-9-4.8 MCG/ACT IN AERO: 2.0000 | INHALATION_SPRAY | Freq: Two times a day (BID) | RESPIRATORY_TRACT | Status: DC

## 2021-07-06 MED ORDER — CITALOPRAM HYDROBROMIDE 10 MG PO TABS
20.0000 mg | ORAL_TABLET | Freq: Every day | ORAL | Status: DC
Start: 2021-07-06 — End: 2021-07-10
  Administered 2021-07-06 – 2021-07-08 (×3): 20 mg via ORAL
  Filled 2021-07-06 (×4): qty 2

## 2021-07-06 MED ORDER — SODIUM CHLORIDE 0.9 % IV SOLN
2.0000 g | INTRAVENOUS | Status: DC
  Administered 2021-07-07 – 2021-07-08 (×2): 2 g via INTRAVENOUS
  Filled 2021-07-06 (×3): qty 2

## 2021-07-06 MED ORDER — FINASTERIDE 5 MG PO TABS
5.0000 mg | ORAL_TABLET | Freq: Every day | ORAL | Status: DC
  Administered 2021-07-07 – 2021-07-09 (×3): 5 mg via ORAL
  Filled 2021-07-06 (×3): qty 1

## 2021-07-06 MED ORDER — ACETAMINOPHEN 650 MG RE SUPP: 650.0000 mg | Freq: Four times a day (QID) | RECTAL | Status: DC | PRN

## 2021-07-06 MED ORDER — ACETAMINOPHEN 325 MG PO TABS: 650.0000 mg | ORAL_TABLET | Freq: Four times a day (QID) | ORAL | Status: DC | PRN

## 2021-07-06 MED ORDER — ONDANSETRON HCL 4 MG/2ML IJ SOLN: 4.0000 mg | Freq: Four times a day (QID) | INTRAMUSCULAR | Status: DC | PRN

## 2021-07-06 MED ORDER — VANCOMYCIN HCL IN DEXTROSE 1-5 GM/200ML-% IV SOLN
1000.0000 mg | Freq: Once | INTRAVENOUS | Status: AC
  Administered 2021-07-06: 1000 mg via INTRAVENOUS
  Filled 2021-07-06: qty 200

## 2021-07-06 MED ORDER — LACTATED RINGERS IV BOLUS
500.0000 mL | Freq: Once | INTRAVENOUS | Status: AC
  Administered 2021-07-06: 500 mL via INTRAVENOUS

## 2021-07-06 MED ORDER — MOMETASONE FURO-FORMOTEROL FUM 100-5 MCG/ACT IN AERO
2.0000 | INHALATION_SPRAY | Freq: Two times a day (BID) | RESPIRATORY_TRACT | Status: DC
  Administered 2021-07-06 – 2021-07-09 (×6): 2 via RESPIRATORY_TRACT
  Filled 2021-07-06: qty 8.8

## 2021-07-06 MED ORDER — LACTATED RINGERS IV SOLN: INTRAVENOUS | Status: DC

## 2021-07-06 MED ORDER — UMECLIDINIUM BROMIDE 62.5 MCG/ACT IN AEPB
1.0000 | INHALATION_SPRAY | Freq: Every day | RESPIRATORY_TRACT | Status: DC
  Administered 2021-07-07 – 2021-07-09 (×3): 1 via RESPIRATORY_TRACT
  Filled 2021-07-06: qty 7

## 2021-07-06 MED ORDER — ENOXAPARIN SODIUM 30 MG/0.3ML IJ SOSY
30.0000 mg | PREFILLED_SYRINGE | INTRAMUSCULAR | Status: DC
  Administered 2021-07-06 – 2021-07-08 (×3): 30 mg via SUBCUTANEOUS
  Filled 2021-07-06 (×3): qty 0.3

## 2021-07-06 MED ORDER — AMLODIPINE BESYLATE 5 MG PO TABS
10.0000 mg | ORAL_TABLET | Freq: Every day | ORAL | Status: DC
  Administered 2021-07-06 – 2021-07-08 (×3): 10 mg via ORAL
  Filled 2021-07-06 (×3): qty 2

## 2021-07-06 MED ORDER — ATORVASTATIN CALCIUM 40 MG PO TABS
40.0000 mg | ORAL_TABLET | Freq: Every day | ORAL | Status: DC
  Administered 2021-07-07 – 2021-07-09 (×3): 40 mg via ORAL
  Filled 2021-07-06 (×3): qty 1

## 2021-07-06 MED ORDER — ENSURE ENLIVE PO LIQD
237.0000 mL | Freq: Two times a day (BID) | ORAL | Status: DC
  Administered 2021-07-08: 237 mL via ORAL

## 2021-07-06 MED ORDER — TAMSULOSIN HCL 0.4 MG PO CAPS
0.8000 mg | ORAL_CAPSULE | Freq: Every day | ORAL | Status: DC
  Administered 2021-07-06 – 2021-07-08 (×3): 0.8 mg via ORAL
  Filled 2021-07-06 (×3): qty 2

## 2021-07-06 MED ORDER — SODIUM CHLORIDE 0.9 % IV SOLN: INTRAVENOUS | Status: DC

## 2021-07-06 NOTE — Assessment & Plan Note (Addendum)
-  Pt quit smoking 37yrs ago.  -Pt congratulated

## 2021-07-06 NOTE — Progress Notes (Signed)
Pharmacy Antibiotic Note  Samuel [name] is a 71 y.o. male admitted on 07/06/2021 with UTI.  PMH significant for bladder cancer with nephrostomy tubes, CKD. Patient recently completed course of SMX/TMP for staph lugdunensis UTI. Pharmacy has been consulted for cefepime dosing.  Today, 07/06/21 WBC low AKI. SCr 2.8, CrCl ~19 mL/min Afebrile  Plan: Cefepime 2 g IV q24h Monitor renal function and culture data  Height: 5\' 3"  (160 cm) Weight: 66.7 kg (147 lb) IBW/kg (Calculated) : 56.9  Temp (24hrs), Avg:98.3 F (36.8 C), Min:98.3 F (36.8 C), Max:98.3 F (36.8 C)  Recent Labs  Lab 07/06/21 1302 07/06/21 1306  WBC  --  1.5*  CREATININE  --  2.80*  LATICACIDVEN 1.6  --     Estimated Creatinine Clearance: 19.5 mL/min (A) (by C-G formula based on SCr of 2.8 mg/dL (H)).    Allergies  Allergen Reactions   Penicillins Diarrhea and Nausea And Vomiting    Did it involve swelling of the face/tongue/throat, SOB, or low BP? No Did it involve sudden or severe rash/hives, skin peeling, or any reaction on the inside of your mouth or nose?  Did you need to seek medical attention at a hospital or doctor's office? No When did it last happen?    Several Years Ago   If all above answers are "NO", may proceed with cephalosporin use.      Antimicrobials this admission: cefepime 2/24 >>  Vancomycin x1 dose in ED 2/24  Dose adjustments this admission:  Microbiology results: 2/24 BCx:  2/24 UCx:    Lenis Noon, PharmD 07/06/2021 7:43 PM

## 2021-07-06 NOTE — ED Triage Notes (Signed)
Patient reports that he has been vomiting blood when he eats x 10 days. Patient also c/o diarrhea x 2 days. Patient states no blood present. Patient has nephrostomy tubes x 2 and the left one has decreased urine output x 4 days and the right nephrostomy tube has blood present since nephrostomy tube replaced at the beginning of February.  Patient's wife reports that the patient urinated from his penis on 06/20/21 and 06/30/21 and was bloody.

## 2021-07-06 NOTE — Assessment & Plan Note (Signed)
-  Cont home statin per home regimen

## 2021-07-06 NOTE — Plan of Care (Signed)
  Problem: Health Behavior/Discharge Planning: Goal: Ability to manage health-related needs will improve Outcome: Progressing   Problem: Clinical Measurements: Goal: Ability to maintain clinical measurements within normal limits will improve Outcome: Progressing Goal: Will remain free from infection Outcome: Progressing Goal: Diagnostic test results will improve Outcome: Progressing Goal: Respiratory complications will improve Outcome: Progressing Goal: Cardiovascular complication will be avoided Outcome: Progressing   Problem: Activity: Goal: Risk for activity intolerance will decrease Outcome: Progressing   Problem: Coping: Goal: Level of anxiety will decrease Outcome: Progressing   Problem: Pain Managment: Goal: General experience of comfort will improve Outcome: Progressing   Problem: Safety: Goal: Ability to remain free from injury will improve Outcome: Progressing   Problem: Skin Integrity: Goal: Risk for impaired skin integrity will decrease Outcome: Progressing   

## 2021-07-06 NOTE — Assessment & Plan Note (Addendum)
-   UA reviewed, suggestive of UTI - Pt was continued on cefepime initially - Urine cx pos for pseudomonas from R nephrostomy tube -Pt reported feeling much better - abx was changed to cipro per culture results - IR was consulted and B nephrostomies were changed 2/27

## 2021-07-06 NOTE — Assessment & Plan Note (Addendum)
-  BP had been stable -Hemodynamically stable and pt is conversing and appearing well -Cont regimen as tolerated

## 2021-07-06 NOTE — Progress Notes (Signed)
A consult was received from an ED physician for vancomycin and vancomycin per pharmacy dosing.  The patient's profile has been reviewed for ht/wt/allergies/indication/available labs.    A one time order has been placed for vancomycin 1000 mg and cefepime 2gm IV x1.  Further antibiotics/pharmacy consults should be ordered by admitting physician if indicated.                       Thank you, Lynelle Doctor 07/06/2021  5:06 PM

## 2021-07-06 NOTE — ED Provider Triage Note (Signed)
Emergency Medicine Provider Triage Evaluation Note  Samuel [name] , a 71 y.o. male  was evaluated in triage.  Pt complains of SHOB, decreased PO intake, vomiting (with blood, stools noted to be dark). On 81mg  ASA Last chemo was Wednesday last week, bladder CA.  Fever 102.1 last night. 100.6 this AM, last Tylenol 4:30AM today Nephrostomy bags Hx COPD, on 2L [place] baseline for sleeping  Review of Systems  Positive: SHOB, nausea/vomiting, hematemsis, loose dark stools, fever 102.1 (chemo pt) Negative: CP, cough  Physical Exam  BP 109/70 (BP Location: Right Arm)    Pulse (!) 104    Temp 98.3 F (36.8 C) (Oral)    Resp 18    Ht 5\' 3"  (1.6 m)    Wt 66.7 kg    SpO2 97%    BMI 26.04 kg/m  Gen:   Awake, no distress   Resp:  Tachypenic  MSK:   Moves extremities without difficulty  Other:    Medical Decision Making  Medically screening exam initiated at 11:59 AM.  Appropriate orders placed.  Vardaan Depascale Buchholz was informed that the remainder of the evaluation will be completed by another provider, this initial triage assessment does not replace that evaluation, and the importance of remaining in the ED until their evaluation is complete.     Tacy Learn, PA-C 07/06/21 1203

## 2021-07-06 NOTE — H&P (Signed)
History and Physical    Patient: Samuel [name] ACZ:[id number] DOB: [birth year] DOA: 07/06/2021 DOS: the patient was seen and examined on 07/06/2021 PCP: Dorothyann Peng, NP  Patient coming from: Home  Chief Complaint:  Chief Complaint  Patient presents with   Hematemesis   Diarrhea   Hematuria   Shortness of Breath   Fever    HPI: Samuel [name] is a 71 y.o. male with medical history significant of bladder cancer with nephrostomy tubes placed by Urology, CKD, arthritis, HTN. Presented with one week hx of malaise, weakness, nausea, diarrhea. Pt reported so weak, he was essentially bedbound over the past week. Pt was seen by Urology,who treated pt with bactrim for UTI, completing 7-8 day course. Symptoms did not improve, thus pt presented to ED. Pt noted to have temp of 102F on early AM of admit.   In Ed, pt noted to have UA suggestive of UTI. Pt was given dose of vanc and cefepime.   Review of Systems: As mentioned in the history of present illness. All other systems reviewed and are negative. Past Medical History:  Diagnosis Date   Anemia    Anxiety    Arthritis    Bladder cancer [facility])    urologist--- dr Tresa Moore   BPH with obstruction/lower urinary tract symptoms    CKD (chronic kidney disease), stage III ([place])    Congenital deformity of left hand    COPD (chronic obstructive pulmonary disease) ([place])    followed by pcp   Depression    Dyspnea    Essential hypertension    followed by pcp   History of adenomatous polyp of colon    Hyperlipidemia    Renal cysts, acquired, bilateral    Wears glasses    Past Surgical History:  Procedure Laterality Date   COLONOSCOPY WITH PROPOFOL  last one 02-04-2017  dr Henrene Pastor   CYSTOSCOPY W/ RETROGRADES Bilateral 09/15/2020   Procedure: CYSTOSCOPY WITH RETROGRADE PYELOGRAM;  Surgeon: Alexis Frock, MD;  Location: [facility];  Service: Urology;  Laterality: Bilateral;   CYSTOSCOPY W/ RETROGRADES Bilateral  05/09/2021   Procedure: CYSTOSCOPY;  Surgeon: Alexis Frock, MD;  Location: [facility];  Service: Urology;  Laterality: Bilateral;   IR IMAGING GUIDED PORT INSERTION  06/18/2021   IR NEPHROSTOMY EXCHANGE LEFT  06/18/2021   IR NEPHROSTOMY EXCHANGE RIGHT  06/18/2021   IR NEPHROSTOMY PLACEMENT LEFT  03/30/2021   IR NEPHROSTOMY PLACEMENT RIGHT  04/02/2021   TRANSURETHRAL RESECTION OF BLADDER TUMOR N/A 09/15/2020   Procedure: BLADDER BIOPSY FULGERATION;  Surgeon: Alexis Frock, MD;  Location: [facility];  Service: Urology;  Laterality: N/A;  [place] TUMOR N/A 05/09/2021   Procedure: TRANSURETHRAL RESECTION OF BLADDER TUMOR (TURBT);  Surgeon: Alexis Frock, MD;  Location: [facility];  Service: Urology;  Laterality: N/A;  67 MINS   Social History:  reports that he quit smoking about 4 years ago. His smoking use included cigarettes. He has never used smokeless tobacco. He reports that he does not drink alcohol and does not use drugs.  Allergies  Allergen Reactions   Penicillins Diarrhea and Nausea And Vomiting    Did it involve swelling of the face/tongue/throat, SOB, or low BP? No Did it involve sudden or severe rash/hives, skin peeling, or any reaction on the inside of your mouth or nose?  Did you need to seek medical attention at a hospital or doctor's office? No When did it last happen?    Several  Years Ago   If all above answers are "NO", may proceed with cephalosporin use.      Family History  Problem Relation Age of Onset   Hypertension Mother    Stroke Mother    Heart disease Father    Breast cancer Sister    Prostate cancer Brother    Hypertension Sister    Colon cancer Neg Hx    Esophageal cancer Neg Hx    Pancreatic cancer Neg Hx    Rectal cancer Neg Hx    Stomach cancer Neg Hx     Prior to Admission medications   Medication Sig Start Date End Date Taking? Authorizing Provider  acetaminophen (TYLENOL) 325 MG tablet Take 325 mg  by mouth every 6 (six) hours as needed for moderate pain or fever.   Yes [provider]  albuterol (PROVENTIL) (2.5 MG/3ML) 0.083% nebulizer solution Take 2.5 mg by nebulization in the morning and at bedtime.   Yes [provider]  albuterol (VENTOLIN HFA) 108 (90 Base) MCG/ACT inhaler Inhale 2 puffs into the lungs every 6 (six) hours as needed for wheezing or shortness of breath. 11/08/20  Yes Dewald, Cheryle Horsfall, MD  amLODipine (NORVASC) 10 MG tablet Take 10 mg by mouth at bedtime. 06/05/20  Yes [provider]  aspirin EC 81 MG tablet Take 1 tablet (81 mg total) by mouth daily. Swallow whole. 04/17/21  Yes Early Osmond, MD  atorvastatin (LIPITOR) 40 MG tablet Take 1 tablet (40 mg total) by mouth daily. 04/10/21  Yes [name], [name], NP  Budeson-Glycopyrrol-Formoterol (BREZTRI AEROSPHERE) 160-9-4.8 MCG/ACT AERO Inhale 2 puffs into the lungs in the morning and at bedtime. 03/27/21  Yes [name], [name], NP  Cholecalciferol (VITAMIN D3 PO) Take 1 tablet by mouth daily.   Yes [provider]  citalopram (CELEXA) 20 MG tablet Take 1 tablet (20 mg total) by mouth daily. Patient taking differently: Take 20 mg by mouth at bedtime. 03/27/21  Yes [name], [name], NP  Cyanocobalamin (B-12 PO) Take 1 capsule by mouth daily.   Yes [provider]  finasteride (PROSCAR) 5 MG tablet Take 5 mg by mouth daily. 05/15/20  Yes Alexis Frock, MD  metoprolol succinate (TOPROL-XL) 25 MG 24 hr tablet Take 25 mg by mouth daily.   Yes [provider]  oxyCODONE (OXY IR/ROXICODONE) 5 MG immediate release tablet Take 1 tablet (5 mg total) by mouth every 6 (six) hours as needed for moderate pain or severe pain (Post-operatively). 05/09/21  Yes Alexis Frock, MD  prochlorperazine (COMPAZINE) 10 MG tablet TAKE 1 TABLET(10 MG) BY MOUTH EVERY 6 HOURS AS NEEDED FOR NAUSEA OR VOMITING 06/11/21  Yes Wyatt Portela, MD  tamsulosin (FLOMAX) 0.4 MG CAPS capsule Take 2 capsules by  mouth at bedtime. 04/21/20  Yes [provider]  lidocaine-prilocaine (EMLA) cream Apply 1 application topically as needed. 06/07/21   Wyatt Portela, MD  OXYGEN Inhale 2 L into the lungs See admin instructions. 2L when taking a nap and at bedtime    [provider]    Physical Exam: Vitals:   07/06/21 1630 07/06/21 1645 07/06/21 1700 07/06/21 1800  BP: 120/71  108/80 (!) 93/58  Pulse: 92 (!) 104 92 93  Resp: 19 (!) 22 19 (!) 21  Temp:      TempSrc:      SpO2: 99% 99% 100% 100%  Weight:      Height:       General exam: Awake, laying in bed, in nad Respiratory  system: Normal respiratory effort, no wheezing Cardiovascular system: regular rate, s1, s2 Gastrointestinal system: Soft, nondistended, positive BS Central nervous system: CN2-12 grossly intact, strength intact Extremities: Perfused, no clubbing Skin: Normal skin turgor, no notable skin lesions seen Psychiatry: Mood normal // no visual hallucinations   Data Reviewed:  Labs reviewed, WBC  1.5  Assessment and Plan: * UTI (urinary tract infection)- (present on admission) - UA reviewed, suggestive of UTI - Most recent urine cx growing Staphylococcus lugdunensis - Will repeat urine cx, cont on cefepime to cover healthcare associated UTI  Acute renal failure superimposed on stage 3b chronic kidney disease ([place])- (present on admission) -cont IVF as tolerated -Recheck bmet in AM  Nocturnal hypoxemia due to emphysema ([place])- (present on admission) -Appears stable at this time. No audible wheezing on exam  COPD (chronic obstructive pulmonary disease) ([place])- (present on admission) -Clear on exam, on minimal O2 support at this time  Hyperlipidemia- (present on admission) -Cont home statin per home regimen  Tobacco use- (present on admission) -Pt quit smoking 34yrs ago. Pt congratulated  Essential hypertension- (present on admission) -BP had been stable -Most recent sbp in the 70's, however was obtained  with cuff on much smaller opposite arm with deformity, thus likely falsely low reading. Pt is conversing and appearing well -Cont regimen as tolerated -repeat bmet in AM       Advance Care Planning:   Code Status: DNR DNR, confirmed with patient and family  Consults:   Family Communication: Pt in room, pt's wife at bedside  Severity of Illness: The appropriate patient status for this patient is INPATIENT. Inpatient status is judged to be reasonable and necessary in order to provide the required intensity of service to ensure the patient's safety. The patient's presenting symptoms, physical exam findings, and initial radiographic and laboratory data in the context of their chronic comorbidities is felt to place them at high risk for further clinical deterioration. Furthermore, it is not anticipated that the patient will be medically stable for discharge from the hospital within 2 midnights of admission.   * I certify that at the point of admission it is my clinical judgment that the patient will require inpatient hospital care spanning beyond 2 midnights from the point of admission due to high intensity of service, high risk for further deterioration and high frequency of surveillance required.*  Author: Marylu Lund, MD 07/06/2021 6:27 PM  For on call review [email or web address].

## 2021-07-06 NOTE — Assessment & Plan Note (Addendum)
-  cont IVF as tolerated -Renal function improved with hydration

## 2021-07-06 NOTE — ED Provider Notes (Signed)
[facility] DEPT Provider Note   CSN: [id number] Arrival date & time: 07/06/21  1128     History  Chief Complaint  Patient presents with   Hematemesis   Diarrhea   Hematuria   Shortness of Breath   Fever    Samuel [name] is a 71 y.o. male.  HPI 71 yo male ho bladder ca, recent chemo last week presents with fever that began loc.  Fever at home to 102.1, last tylenol at 0430 today.  No rhinnorhea, sore throat, no cough, no abdominal pain. Nausea, vomiting and diarrhea began with 4 episodes of emesis early this week with some blood in it.  Yesterday with explosive diarrhea that was dark and again today.  No wounds.  Recent treatment for uti last Saturday completed last night-cefexelin changed to  No covid or vaccine.        Home Medications Prior to Admission medications   Medication Sig Start Date End Date Taking? Authorizing Provider  acetaminophen (TYLENOL) 325 MG tablet Take 325 mg by mouth every 6 (six) hours as needed for moderate pain or fever.   Yes [provider]  albuterol (PROVENTIL) (2.5 MG/3ML) 0.083% nebulizer solution Take 2.5 mg by nebulization in the morning and at bedtime.   Yes [provider]  albuterol (VENTOLIN HFA) 108 (90 Base) MCG/ACT inhaler Inhale 2 puffs into the lungs every 6 (six) hours as needed for wheezing or shortness of breath. 11/08/20  Yes Dewald, Cheryle Horsfall, MD  amLODipine (NORVASC) 10 MG tablet Take 10 mg by mouth at bedtime. 06/05/20  Yes [provider]  aspirin EC 81 MG tablet Take 1 tablet (81 mg total) by mouth daily. Swallow whole. 04/17/21  Yes Early Osmond, MD  atorvastatin (LIPITOR) 40 MG tablet Take 1 tablet (40 mg total) by mouth daily. 04/10/21  Yes [name], [name], NP  Budeson-Glycopyrrol-Formoterol (BREZTRI AEROSPHERE) 160-9-4.8 MCG/ACT AERO Inhale 2 puffs into the lungs in the morning and at bedtime. 03/27/21  Yes [name], [name], NP  Cholecalciferol (VITAMIN  D3 PO) Take 1 tablet by mouth daily.   Yes [provider]  citalopram (CELEXA) 20 MG tablet Take 1 tablet (20 mg total) by mouth daily. Patient taking differently: Take 20 mg by mouth at bedtime. 03/27/21  Yes [name], [name], NP  Cyanocobalamin (B-12 PO) Take 1 capsule by mouth daily.   Yes [provider]  finasteride (PROSCAR) 5 MG tablet Take 5 mg by mouth daily. 05/15/20  Yes Alexis Frock, MD  metoprolol succinate (TOPROL-XL) 25 MG 24 hr tablet Take 25 mg by mouth daily.   Yes [provider]  oxyCODONE (OXY IR/ROXICODONE) 5 MG immediate release tablet Take 1 tablet (5 mg total) by mouth every 6 (six) hours as needed for moderate pain or severe pain (Post-operatively). 05/09/21  Yes Alexis Frock, MD  prochlorperazine (COMPAZINE) 10 MG tablet TAKE 1 TABLET(10 MG) BY MOUTH EVERY 6 HOURS AS NEEDED FOR NAUSEA OR VOMITING 06/11/21  Yes Wyatt Portela, MD  tamsulosin (FLOMAX) 0.4 MG CAPS capsule Take 2 capsules by mouth at bedtime. 04/21/20  Yes [provider]  lidocaine-prilocaine (EMLA) cream Apply 1 application topically as needed. 06/07/21   Wyatt Portela, MD  OXYGEN Inhale 2 L into the lungs See admin instructions. 2L when taking a nap and at bedtime    [provider]      Allergies    Penicillins    Review of Systems   Review of Systems  Constitutional:  Positive for appetite change, chills, fatigue, fever and unexpected weight change.  HENT: Negative.    Eyes: Negative.   Respiratory:  Positive for shortness of breath. Negative for cough.   Gastrointestinal:  Positive for diarrhea and vomiting. Negative for abdominal pain.  Endocrine: Negative.   Genitourinary: Negative.   Neurological: Negative.   Hematological: Negative.    Physical Exam Updated Vital Signs BP (!) 93/58    Pulse 93    Temp 98.3 F (36.8 C) (Oral)    Resp (!) 21    Ht 1.6 m (5\' 3" )    Wt 66.7 kg    SpO2 100%    BMI 26.04 kg/m  Physical Exam Vitals and  nursing note reviewed.  Constitutional:      Appearance: He is well-developed.  HENT:     Head: Normocephalic.     Mouth/Throat:     Mouth: Mucous membranes are moist.  Eyes:     Pupils: Pupils are equal, round, and reactive to light.  Cardiovascular:     Rate and Rhythm: Normal rate and regular rhythm.  Pulmonary:     Effort: Pulmonary effort is normal.     Breath sounds: Normal breath sounds.  Abdominal:     General: Bowel sounds are normal.     Palpations: Abdomen is soft.  Musculoskeletal:        General: Normal range of motion.     Cervical back: Normal range of motion.  Skin:    General: Skin is warm.     Capillary Refill: Capillary refill takes less than 2 seconds.     Coloration: Skin is pale.  Neurological:     General: No focal deficit present.     Mental Status: He is alert.  Psychiatric:        Mood and Affect: Mood normal.    ED Results / Procedures / Treatments   Labs (all labs ordered are listed, but only abnormal results are displayed) Labs Reviewed  COMPREHENSIVE METABOLIC PANEL - Abnormal; Notable for the following components:      Result Value   Sodium 134 (*)    CO2 21 (*)    Glucose, Bld 115 (*)    BUN 37 (*)    Creatinine, Ser 2.80 (*)    Calcium 8.6 (*)    Albumin 3.2 (*)    AST 138 (*)    ALT 162 (*)    GFR, Estimated 23 (*)    All other components within normal limits  CBC - Abnormal; Notable for the following components:   WBC 1.5 (*)    RBC 3.25 (*)    Hemoglobin 8.6 (*)    HCT 27.3 (*)    nRBC 1.3 (*)    All other components within normal limits  DIFFERENTIAL - Abnormal; Notable for the following components:   Neutro Abs 1.2 (*)    Lymphs Abs 0.2 (*)    All other components within normal limits  URINALYSIS, ROUTINE W REFLEX MICROSCOPIC - Abnormal; Notable for the following components:   APPearance CLOUDY (*)    Hgb urine dipstick LARGE (*)    Protein, ur >=300 (*)    Leukocytes,Ua SMALL (*)    RBC / HPF >50 (*)    Bacteria,  UA FEW (*)    All other components within normal limits  URINALYSIS, ROUTINE W REFLEX MICROSCOPIC - Abnormal; Notable for the following components:   APPearance HAZY (*)    Glucose, UA 50 (*)    Hgb urine dipstick  SMALL (*)    Protein, ur 100 (*)    Bacteria, UA RARE (*)    All other components within normal limits  CULTURE, BLOOD (ROUTINE X 2)  CULTURE, BLOOD (ROUTINE X 2)  RESP PANEL BY RT-PCR (FLU A&B, COVID) ARPGX2  BRAIN NATRIURETIC PEPTIDE  LACTIC ACID, PLASMA  LACTIC ACID, PLASMA  POC OCCULT BLOOD, ED  TYPE AND SCREEN  TROPONIN I (HIGH SENSITIVITY)  TROPONIN I (HIGH SENSITIVITY)    EKG None  Radiology DG Chest Port 1 View  Result Date: 07/06/2021 CLINICAL DATA:  Vomiting blood with eating for 10 days, diarrhea for 2 days, has BILATERAL nephrostomy tubes EXAM: PORTABLE CHEST 1 VIEW COMPARISON:  Portable exam 1645 hours compared to 06/23/2021 FINDINGS: RIGHT jugular Port-A-Cath with tip projecting over cavoatrial junction. Normal heart size, mediastinal contours, and pulmonary vascularity. Subsegmental atelectasis at LEFT base. Suspect underlying emphysematous changes. No infiltrate, pleural effusion, or pneumothorax. Bones demineralized. IMPRESSION: Probable emphysematous changes with subsegmental atelectasis LEFT base. Electronically Signed   By: Lavonia Dana M.D.   On: 07/06/2021 16:57    Procedures Procedures    Medications Ordered in ED Medications  lactated ringers infusion ( Intravenous New Bag/Given 07/06/21 1725)  vancomycin (VANCOCIN) IVPB 1000 mg/200 mL premix (1,000 mg Intravenous New Bag/Given 07/06/21 1724)  lactated ringers bolus 500 mL (0 mLs Intravenous Stopped 07/06/21 1804)  ceFEPIme (MAXIPIME) 2 g in sodium chloride 0.9 % 100 mL IVPB (0 g Intravenous Stopped 07/06/21 1804)    ED Course/ Medical Decision Making/ A&P Clinical Course as of 07/06/21 1805  Fri Jul 06, 2021  1602 Leukopenia noted on cbc-81% neutrophils-anc-1200  [DR]  1604 Comprehensive  metabolic panel(!) Creatinine increased at 2.8 LFT elevated from prior  [DR]  1608 Urine from left nephrostomy tube shows 0-5 red blood cells 0-5 white blood cells and rare bacteria Urine from right nephrostomy tube, greater than 50 red blood cells with rare bacteria [DR]    Clinical Course User Index [DR] Pattricia Boss, MD                           Medical Decision Making 71 year old man with ongoing treatment for bladder cancer, presents today with fever and generalized weakness. Patient has white blood cell count of 1500 with 81% neutrophils He has bilateral nephrostomy tubes and urinalysis was obtained from each tube. Prior urine cultures reviewed and patient most recently grew out staph lugdunensis in late February. Creatinine is increased today to 2.8 Hemoglobin is stable at 8 LFTs are increased  Amount and/or Complexity of Data Reviewed External Data Reviewed:     Details: Reviewed prior urine culture and patient most recently grew staph lugdunensis from urine culture on 2/28 Unclear which antibiotics he was on Attempting to contact urology to clarify-discussed with Dr. Alyson Ingles and [facility]'s office notes patient was last on Bactrim Labs: ordered. Decision-making details documented in ED Course.    Details: Urinalysis hazy from left Radiology: ordered. Discussion of management or test interpretation with external provider(s): Discussed care with Dr. Alyson Ingles on-call for urology [facility] and patient finished Bactrim today Discussed antibiotic choices with Larey Days.D., plan cefepime and vanc Discussed care with Dr. Wyline Copas, on-call for hospitalist and he will see for admission  Risk Prescription drug management.           Final Clinical Impression(s) / ED Diagnoses Final diagnoses:  Neutropenia, unspecified type (HCC)  Fever, unspecified fever cause  AKI (acute kidney injury) (  [facility])    Rx / [place] Orders ED Discharge Orders      None         Pattricia Boss, MD 07/06/21 [phone]

## 2021-07-06 NOTE — Assessment & Plan Note (Signed)
-  Clear on exam, on minimal O2 support at this time

## 2021-07-06 NOTE — Assessment & Plan Note (Addendum)
-  Appears stable at this time.  -No audible wheezing on exam

## 2021-07-07 DIAGNOSIS — D649 Anemia, unspecified: Secondary | ICD-10-CM | POA: Insufficient documentation

## 2021-07-07 DIAGNOSIS — D72819 Decreased white blood cell count, unspecified: Secondary | ICD-10-CM

## 2021-07-07 LAB — PREPARE RBC (CROSSMATCH)

## 2021-07-07 LAB — HEMOGLOBIN AND HEMATOCRIT, BLOOD
HCT: 24.1 % — ABNORMAL LOW (ref 39.0–52.0)
Hemoglobin: 7.8 g/dL — ABNORMAL LOW (ref 13.0–17.0)

## 2021-07-07 LAB — CBC
HCT: 21.6 % — ABNORMAL LOW (ref 39.0–52.0)
Hemoglobin: 6.9 g/dL — CL (ref 13.0–17.0)
MCH: 27 pg (ref 26.0–34.0)
MCHC: 31.9 g/dL (ref 30.0–36.0)
MCV: 84.4 fL (ref 80.0–100.0)
Platelets: 208 10*3/uL (ref 150–400)
RBC: 2.56 MIL/uL — ABNORMAL LOW (ref 4.22–5.81)
RDW: 15.7 % — ABNORMAL HIGH (ref 11.5–15.5)
WBC: 1.2 10*3/uL — CL (ref 4.0–10.5)
nRBC: 2.5 % — ABNORMAL HIGH (ref 0.0–0.2)

## 2021-07-07 LAB — COMPREHENSIVE METABOLIC PANEL
ALT: 191 U/L — ABNORMAL HIGH (ref 0–44)
AST: 158 U/L — ABNORMAL HIGH (ref 15–41)
Albumin: 2.6 g/dL — ABNORMAL LOW (ref 3.5–5.0)
Alkaline Phosphatase: 62 U/L (ref 38–126)
Anion gap: 9 (ref 5–15)
BUN: 33 mg/dL — ABNORMAL HIGH (ref 8–23)
CO2: 18 mmol/L — ABNORMAL LOW (ref 22–32)
Calcium: 7.7 mg/dL — ABNORMAL LOW (ref 8.9–10.3)
Chloride: 104 mmol/L (ref 98–111)
Creatinine, Ser: 2.41 mg/dL — ABNORMAL HIGH (ref 0.61–1.24)
GFR, Estimated: 28 mL/min — ABNORMAL LOW (ref >60)
Glucose, Bld: 88 mg/dL (ref 70–99)
Potassium: 3.5 mmol/L (ref 3.5–5.1)
Sodium: 131 mmol/L — ABNORMAL LOW (ref 135–145)
Total Bilirubin: 0.2 mg/dL — ABNORMAL LOW (ref 0.3–1.2)
Total Protein: 5.9 g/dL — ABNORMAL LOW (ref 6.5–8.1)

## 2021-07-07 MED ORDER — CHLORHEXIDINE GLUCONATE CLOTH 2 % EX PADS
6.0000 | MEDICATED_PAD | Freq: Every day | CUTANEOUS | Status: DC
  Administered 2021-07-07 – 2021-07-09 (×3): 6 via TOPICAL

## 2021-07-07 MED ORDER — SODIUM CHLORIDE 0.9% IV SOLUTION: Freq: Once | INTRAVENOUS | Status: AC

## 2021-07-07 MED ORDER — SODIUM CHLORIDE 0.9% FLUSH
10.0000 mL | Freq: Two times a day (BID) | INTRAVENOUS | Status: DC
  Administered 2021-07-07 – 2021-07-09 (×4): 10 mL

## 2021-07-07 MED ORDER — LOPERAMIDE HCL 2 MG PO CAPS: 2.0000 mg | ORAL_CAPSULE | ORAL | Status: DC | PRN

## 2021-07-07 MED ORDER — SODIUM CHLORIDE 0.9% FLUSH: 10.0000 mL | INTRAVENOUS | Status: DC | PRN

## 2021-07-07 NOTE — Evaluation (Signed)
Physical Therapy Evaluation Patient Details Name: Samuel [name] MRN: [id number] DOB: July 19, 1950 Today's Date: 07/07/2021  History of Present Illness  71 yo male presents to [facility] on 2/24 wtih vomiting x10 days, diarrhea x2 days, decreased urine output. Workup for UTI, ARF. PMH includes nephrostomy tubes exchange 06/18/2020, bladder cancer on chemo as recent as last week and s/p transurethral resection bladder tumor 5 and 04/2021, OA, CKD, HTN, COPD, L hand congenial deformity.  Clinical Impression   Pt presents with generalized weakness, impaired activity tolerance, DOE 2/4 on 2LO2 with cues needed for breathing technique, and impaired balance vs baseline. Pt to benefit from acute PT to address deficits. Pt tolerated stand and pivot to recliner today without AD and close guard for safety, pt limited in tolerance by fatigue and dyspnea on exertion, 2LO2 donned throughout session. PT anticipates pt will progress well with frequent mobility while acute, pt's wife present during session and supportive. PT to progress mobility as tolerated, and will continue to follow acutely.         Recommendations for follow up therapy are one component of a multi-disciplinary discharge planning process, led by the attending physician.  Recommendations may be updated based on patient status, additional functional criteria and insurance authorization.  Follow Up Recommendations Home health PT    Assistance Recommended at Discharge Intermittent Supervision/Assistance  Patient can return home with the following  A little help with walking and/or transfers;A little help with bathing/dressing/bathroom;Assist for transportation;Help with stairs or ramp for entrance;Direct supervision/assist for medications management;Assistance with cooking/housework    Equipment Recommendations None recommended by PT  Recommendations for Other Services       Functional Status Assessment Patient has had a recent decline in  their functional status and demonstrates the ability to make significant improvements in function in a reasonable and predictable amount of time.     Precautions / Restrictions Precautions Precautions: Fall Precaution Comments: bilat nephrostomy bags Restrictions Weight Bearing Restrictions: No      Mobility  Bed Mobility Overal bed mobility: Needs Assistance Bed Mobility: Supine to Sit     Supine to sit: Supervision, HOB elevated     General bed mobility comments: for safety, increased time and cues to rest and for breathing technique once sitting EOB given DOE 2/4    Transfers Overall transfer level: Needs assistance Equipment used: None Transfers: Sit to/from Stand, Bed to chair/wheelchair/BSC Sit to Stand: Min guard   Step pivot transfers: Min guard       General transfer comment: close guard for safety, STS x2 from EOB, DOE 2/4 when transfer from bed to recliner but recovers with rest and cues for breathing technique. 2LO2 donned throughout session.    Ambulation/Gait               General Gait Details: NT - pt fatigue  Stairs            Wheelchair Mobility    Modified Rankin (Stroke Patients Only)       Balance Overall balance assessment: Needs assistance Sitting-balance support: No upper extremity supported, Feet supported Sitting balance-Leahy Scale: Good     Standing balance support: No upper extremity supported, During functional activity Standing balance-Leahy Scale: Fair                               Pertinent Vitals/Pain Pain Assessment Pain Assessment: Faces Faces Pain Scale: Hurts a little bit Pain Location: generalized Pain Descriptors /  Indicators: Discomfort Pain Intervention(s): Limited activity within patient's tolerance, Monitored during session, Repositioned    Home Living Family/patient expects to be discharged to:: Private residence Living Arrangements: Spouse/significant other Available Help at  Discharge: Family;Available 24 hours/day Type of Home: House Home Access: Stairs to enter   [employer] of Steps: 1 (threshold)   Home Layout: One level Home Equipment: Other (comment);Shower seat;Wheelchair - manual Additional Comments: toilet handles recently installed, home O2 for nighttime and PRN    Prior Function Prior Level of Function : Needs assist             Mobility Comments: pt reports independence with mobility, no history of falls. Pt's wife states pt has a wheelchair for community distances but doesn't use it, typically is a household mobilizer only ADLs Comments: Pt reports wife assists him with dressing, bathing, meal prep     Hand Dominance   Dominant Hand: Right    Extremity/Trunk Assessment   Upper Extremity Assessment Upper Extremity Assessment: Defer to OT evaluation    Lower Extremity Assessment Lower Extremity Assessment: Generalized weakness (at least 3/5 as screened in supine all motions)    Cervical / Trunk Assessment Cervical / Trunk Assessment: Normal  Communication   Communication: No difficulties  Cognition Arousal/Alertness: Awake/alert Behavior During Therapy: Flat affect Overall Cognitive Status: Within Functional Limits for tasks assessed                                          General Comments General comments (skin integrity, edema, etc.): 2LO2 via [place]    Exercises General Exercises - Lower Extremity Long Arc Quad: AROM, Both, 5 reps, Seated Hip Flexion/Marching: AROM, Both, 5 reps, Seated   Assessment/Plan    PT Assessment Patient needs continued PT services  PT Problem List Decreased mobility;Decreased strength;Decreased activity tolerance;Decreased balance;Decreased knowledge of use of DME;Pain;Decreased safety awareness       PT Treatment Interventions DME instruction;Therapeutic activities;Gait training;Patient/family education;Therapeutic exercise;Balance training;Stair  training;Functional mobility training;Neuromuscular re-education    PT Goals (Current goals can be found in the Care Plan section)  Acute Rehab PT Goals Patient Stated Goal: home when able PT Goal Formulation: With patient/family Time For Goal Achievement: 07/21/21 Potential to Achieve Goals: Good    Frequency Min 3X/week     Co-evaluation               AM-PAC PT "6 Clicks" Mobility  Outcome Measure Help needed turning from your back to your side while in a flat bed without using bedrails?: A Little Help needed moving from lying on your back to sitting on the side of a flat bed without using bedrails?: A Little Help needed moving to and from a bed to a chair (including a wheelchair)?: A Little Help needed standing up from a chair using your arms (e.g., wheelchair or bedside chair)?: A Little Help needed to walk in hospital room?: A Little Help needed climbing 3-5 steps with a railing? : A Little 6 Click Score: 18    End of Session Equipment Utilized During Treatment: Oxygen Activity Tolerance: Patient tolerated treatment well;Patient limited by fatigue Patient left: in chair;with chair alarm set;with call bell/phone within reach;with family/visitor present Nurse Communication: Mobility status PT Visit Diagnosis: Other abnormalities of gait and mobility (R26.89);Muscle weakness (generalized) (M62.81)    Time: 2778-2423 PT Time Calculation (min) (ACUTE ONLY): 18 min   Charges:   PT  Evaluation $PT Eval Low Complexity: 1 Low          Leylani Duley S, PT DPT Acute Rehabilitation Services Pager [phone]  Office [phone]   Louis Matte 07/07/2021, 12:31 PM

## 2021-07-07 NOTE — Assessment & Plan Note (Addendum)
-  Likely secondary to recent chemotherapy -WBC improved to 2.0 at time of d/c. Pt remained stable

## 2021-07-07 NOTE — Progress Notes (Signed)
Received call at 0510 regarding critical lab results of WBC at 1.2. Hgb at 6.9. Rufina Falco, NP notified via secure chat.

## 2021-07-07 NOTE — Evaluation (Signed)
Occupational Therapy Evaluation Patient Details Name: Samuel [name] MRN: [id number] DOB: 09/30/1950 Today's Date: 07/07/2021   History of Present Illness 71 yo male presents to [facility] on 2/24 wtih vomiting x10 days, diarrhea x2 days, decreased urine output. Workup for UTI, ARF. PMH includes nephrostomy tubes exchange 06/18/2020, bladder cancer on chemo as recent as last week and s/p transurethral resection bladder tumor 5 and 04/2021, OA, CKD, HTN, COPD, L hand congenial deformity.   Clinical Impression   [name] is a 71 year old man who presents with above medical history. On evaluation he demonstrates ability to perform bed mobility and stand without physical assistance. He requires assistance with ADLs at baseline - and his wife helps him 24/7. He does exhibit decreased activity tolerance with movement and reportedly worse dyspnea. He is currently on 2 L [place]. From an ADL  stand point patient is at his baseline - needing assistance from wife and he demonstrates the ability to perform basic mobility. No current OT needs at this time.      Recommendations for follow up therapy are one component of a multi-disciplinary discharge planning process, led by the attending physician.  Recommendations may be updated based on patient status, additional functional criteria and insurance authorization.   Follow Up Recommendations  No OT follow up    Assistance Recommended at Discharge Frequent or constant Supervision/Assistance  Patient can return home with the following A little help with walking and/or transfers;A lot of help with bathing/dressing/bathroom;Assistance with cooking/housework    Functional Status Assessment  Patient has not had a recent decline in their functional status  Equipment Recommendations  None recommended by OT    Recommendations for Other Services       Precautions / Restrictions Precautions Precautions: Fall Precaution Comments: bilat nephrostomy  bags Restrictions Weight Bearing Restrictions: No      Mobility Bed Mobility                    Transfers                          Balance   Sitting-balance support: No upper extremity supported, Feet supported Sitting balance-Leahy Scale: Good     Standing balance support: No upper extremity supported, During functional activity Standing balance-Leahy Scale: Fair                             ADL either performed or assessed with clinical judgement   ADL Overall ADL's : At baseline                                       General ADL Comments: Patient reports having assistance with dressing, bathing and any other ADLs needed from wife. He is typically abel to perform toiletinng by himself. Assistance needed for all fasteners.     Vision Patient Visual Report: No change from baseline       Perception     Praxis      Pertinent Vitals/Pain Pain Assessment Pain Assessment: No/denies pain     Hand Dominance Right   Extremity/Trunk Assessment Upper Extremity Assessment Upper Extremity Assessment: RUE deficits/detail;LUE deficits/detail RUE Deficits / Details: WFL ROM, 5/5 strength RUE Sensation: WNL RUE Coordination: WNL LUE Deficits / Details: Congenital deformity - at baseline   Lower Extremity Assessment Lower Extremity  Assessment: Defer to PT evaluation   Cervical / Trunk Assessment Cervical / Trunk Assessment: Normal   Communication Communication Communication: No difficulties   Cognition Arousal/Alertness: Awake/alert Behavior During Therapy: Flat affect Overall Cognitive Status: Within Functional Limits for tasks assessed                                       General Comments  2LO2 via [place]    Exercises     Shoulder Instructions      Home Living Family/patient expects to be discharged to:: Private residence Living Arrangements: Spouse/significant other Available Help at Discharge:  Family;Available 24 hours/day Type of Home: House Home Access: Stairs to enter [employer] of Steps: 1 (threshold)   Home Layout: One level     Bathroom Shower/Tub: Corporate investment banker: Standard     Home Equipment: Other (comment);Shower seat;Wheelchair - manual   Additional Comments: toilet handles recently installed, home O2 for nighttime and PRN      Prior Functioning/Environment Prior Level of Function : Needs assist             Mobility Comments: pt reports independence with mobility, no history of falls. Pt's wife states pt has a wheelchair for community distances but doesn't use it, typically is a household mobilizer only ADLs Comments: Pt reports wife assists him with dressing, bathing, meal prep        OT Problem List: Decreased activity tolerance      OT Treatment/Interventions:      OT Goals(Current goals can be found in the care plan section) Acute Rehab OT Goals OT Goal Formulation: All assessment and education complete, DC therapy  OT Frequency:      Co-evaluation              AM-PAC OT "6 Clicks" Daily Activity     Outcome Measure Help from another person eating meals?: A Little Help from another person taking care of personal grooming?: A Little Help from another person toileting, which includes using toliet, bedpan, or urinal?: A Little Help from another person bathing (including washing, rinsing, drying)?: A Little Help from another person to put on and taking off regular upper body clothing?: A Little Help from another person to put on and taking off regular lower body clothing?: A Lot 6 Click Score: 17   End of Session Nurse Communication: Mobility status  Activity Tolerance: Patient limited by fatigue Patient left: in bed;with call bell/phone within reach;with family/visitor present  OT Visit Diagnosis: Muscle weakness (generalized) (M62.81)                Time: 5465-0354 OT Time Calculation (min):  12 min Charges:  OT General Charges $OT Visit: 1 Visit OT Evaluation $OT Eval Low Complexity: 1 Low  Angeles Zehner, OTR/L [place]  Office [phone] Pager: [place] 07/07/2021, 3:56 PM

## 2021-07-07 NOTE — Assessment & Plan Note (Addendum)
-  likely related to malignancy and recent chemo -hgb down to 6.9 this visit, improved with 1 unit PRBC transfusion -Hgb had since remained stable the remainder of visit

## 2021-07-07 NOTE — Progress Notes (Signed)
°  Progress Note   Patient: Samuel [name] XVQ:[id number] DOB: Dec 17, 1950 DOA: 07/06/2021     1 DOS: the patient was seen and examined on 07/07/2021   Brief hospital course: 71 y.o. male with medical history significant of bladder cancer with nephrostomy tubes placed by Urology, CKD, arthritis, HTN. Presented with one week hx of malaise, weakness, nausea, diarrhea. Pt reported so weak, he was essentially bedbound over the past week. Pt was seen by Urology,who treated pt with bactrim for UTI, completing 7-8 day course. Symptoms did not improve, thus pt presented to ED. Pt noted to have temp of 102F on early AM of admit.  Assessment and Plan: * UTI (urinary tract infection)- (present on admission) - UA reviewed, suggestive of UTI - Most recent urine cx growing Staphylococcus lugdunensis - Repeat urine cx pending, cont on cefepime to cover healthcare associated UTI -Pt reported feeling better today  Leukopenia -Likely secondary to recent chemotherapy -Stable at present -Repeat CBC in AM  Acute on chronic anemia -likely related to malignancy and recent chemo -hgb down to 6.9 this AM, requiring 1 unit PRBC transfusion -Repeat cbc in AM  Acute renal failure superimposed on stage 3b chronic kidney disease ([place])- (present on admission) -cont IVF as tolerated -Renal function improved somewhat -Recheck bmet in AM  Nocturnal hypoxemia due to emphysema [facility])- (present on admission) -Appears stable at this time. No audible wheezing on exam  COPD (chronic obstructive pulmonary disease) ([place])- (present on admission) -Clear on exam, on minimal O2 support at this time  Hyperlipidemia- (present on admission) -Cont home statin per home regimen  Tobacco use- (present on admission) -Pt quit smoking 59yrs ago. Pt congratulated  Essential hypertension- (present on admission) -BP had been stable -Hemodynamically stable and pt is conversing and appearing well -Cont regimen as  tolerated -repeat bmet in AM        Subjective: Reports feeling better today  Physical Exam: Vitals:   07/07/21 0826 07/07/21 0945 07/07/21 1039 07/07/21 1344  BP:  133/62 (!) 104/59 113/64  Pulse:  85 87 84  Resp:  16 18 14   Temp:  98.1 F (36.7 C) 98.5 F (36.9 C) (!) 97.4 F (36.3 C)  TempSrc:  Oral Oral Oral  SpO2: 100% 100% 99% 99%  Weight:      Height:       General exam: Awake, laying in bed, in nad Respiratory system: Normal respiratory effort, no wheezing Cardiovascular system: regular rate, s1, s2 Gastrointestinal system: Soft, nondistended, positive BS Central nervous system: CN2-12 grossly intact, strength intact Extremities: Perfused, no clubbing Skin: Normal skin turgor, no notable skin lesions seen Psychiatry: Mood normal // no visual hallucinations   Data Reviewed:  Labs reviewed. Hgb 6.9   Family Communication: Pt in room, family at bedside  Disposition: Status is: Inpatient Remains inpatient appropriate because: Severity of illness     Planned Discharge Destination: Home      Author: Marylu Lund, MD 07/07/2021 3:54 PM  For on call review [email or web address].

## 2021-07-08 LAB — URINE CULTURE: Culture: NO GROWTH

## 2021-07-08 LAB — CBC
HCT: 24.2 % — ABNORMAL LOW (ref 39.0–52.0)
Hemoglobin: 7.8 g/dL — ABNORMAL LOW (ref 13.0–17.0)
MCH: 27.2 pg (ref 26.0–34.0)
MCHC: 32.2 g/dL (ref 30.0–36.0)
MCV: 84.3 fL (ref 80.0–100.0)
Platelets: 367 10*3/uL (ref 150–400)
RBC: 2.87 MIL/uL — ABNORMAL LOW (ref 4.22–5.81)
RDW: 15.9 % — ABNORMAL HIGH (ref 11.5–15.5)
WBC: 1.4 10*3/uL — CL (ref 4.0–10.5)
nRBC: 2.9 % — ABNORMAL HIGH (ref 0.0–0.2)

## 2021-07-08 LAB — COMPREHENSIVE METABOLIC PANEL
ALT: 162 U/L — ABNORMAL HIGH (ref 0–44)
AST: 102 U/L — ABNORMAL HIGH (ref 15–41)
Albumin: 2.2 g/dL — ABNORMAL LOW (ref 3.5–5.0)
Alkaline Phosphatase: 56 U/L (ref 38–126)
Anion gap: 4 — ABNORMAL LOW (ref 5–15)
BUN: 31 mg/dL — ABNORMAL HIGH (ref 8–23)
CO2: 22 mmol/L (ref 22–32)
Calcium: 7.9 mg/dL — ABNORMAL LOW (ref 8.9–10.3)
Chloride: 110 mmol/L (ref 98–111)
Creatinine, Ser: 1.9 mg/dL — ABNORMAL HIGH (ref 0.61–1.24)
GFR, Estimated: 37 mL/min — ABNORMAL LOW (ref >60)
Glucose, Bld: 103 mg/dL — ABNORMAL HIGH (ref 70–99)
Potassium: 4 mmol/L (ref 3.5–5.1)
Sodium: 136 mmol/L (ref 135–145)
Total Bilirubin: 0.2 mg/dL — ABNORMAL LOW (ref 0.3–1.2)
Total Protein: 5.4 g/dL — ABNORMAL LOW (ref 6.5–8.1)

## 2021-07-08 MED ORDER — ADULT MULTIVITAMIN W/MINERALS CH
1.0000 | ORAL_TABLET | Freq: Every day | ORAL | Status: DC
  Administered 2021-07-08 – 2021-07-09 (×2): 1 via ORAL
  Filled 2021-07-08 (×2): qty 1

## 2021-07-08 NOTE — Progress Notes (Signed)
MD at bedside. 

## 2021-07-08 NOTE — Progress Notes (Signed)
°  Progress Note   Patient: Samuel [name] OIT:[id number] DOB: Jun 29, 1950 DOA: 07/06/2021     2 DOS: the patient was seen and examined on 07/08/2021   Brief hospital course: 71 y.o. male with medical history significant of bladder cancer with nephrostomy tubes placed by Urology, CKD, arthritis, HTN. Presented with one week hx of malaise, weakness, nausea, diarrhea. Pt reported so weak, he was essentially bedbound over the past week. Pt was seen by Urology,who treated pt with bactrim for UTI, completing 7-8 day course. Symptoms did not improve, thus pt presented to ED. Pt noted to have temp of 102F on early AM of admit.  Assessment and Plan: * UTI (urinary tract infection)- (present on admission) - UA reviewed, suggestive of UTI - Currently continued on cefepime - Urine cx pos for pseudomonas from R nephrostomy tube -Pt reported feeling much better - Have consulted IR regarding possible tube replacement  Leukopenia -Likely secondary to recent chemotherapy -Stable at present -Repeat CBC in AM  Acute on chronic anemia -likely related to malignancy and recent chemo -hgb down to 6.9 this visit, improved with 1 unit PRBC transfusion -Hgb remains stable -Repeat cbc in AM  Acute renal failure superimposed on stage 3b chronic kidney disease ([place])- (present on admission) -cont IVF as tolerated -Renal function improved somewhat -Recheck bmet in AM  Nocturnal hypoxemia due to emphysema [facility])- (present on admission) -Appears stable at this time. No audible wheezing on exam  COPD (chronic obstructive pulmonary disease) ([place])- (present on admission) -Clear on exam, on minimal O2 support at this time  Hyperlipidemia- (present on admission) -Cont home statin per home regimen  Tobacco use- (present on admission) -Pt quit smoking 47yrs ago. Pt congratulated  Essential hypertension- (present on admission) -BP had been stable -Hemodynamically stable and pt is conversing and appearing  well -Cont regimen as tolerated -repeat bmet in AM        Subjective: States feeling better. Eager to eat solid food  Physical Exam: Vitals:   07/07/21 2044 07/08/21 0418 07/08/21 0959 07/08/21 1230  BP: 120/67 117/72 112/63 (!) 109/56  Pulse: 90 83 84 90  Resp: 20 18 14 19   Temp: 98 F (36.7 C) 97.9 F (36.6 C)  97.7 F (36.5 C)  TempSrc: Oral Oral  Oral  SpO2: 97% 98% 97% 98%  Weight:      Height:       General exam: Conversant, in no acute distress Respiratory system: normal chest rise, clear, no audible wheezing Cardiovascular system: regular rhythm, s1-s2 Gastrointestinal system: Nondistended, nontender, pos BS Central nervous system: No seizures, no tremors Extremities: No cyanosis, no joint deformities Skin: No rashes, no pallor Psychiatry: Affect normal // no auditory hallucinations   Data Reviewed:  Labs reviewed. Hgb 6.9   Family Communication: Pt in room, family at bedside  Disposition: Status is: Inpatient Remains inpatient appropriate because: Severity of illness     Planned Discharge Destination: Home      Author: Marylu Lund, MD 07/08/2021 3:04 PM  For on call review [email or web address].

## 2021-07-08 NOTE — Progress Notes (Signed)
Initial Nutrition Assessment  INTERVENTION:   -Ensure Plus High Protein po BID, each supplement provides 350 kcal and 20 grams of protein.   -Multivitamin with minerals daily  NUTRITION DIAGNOSIS:   Increased nutrient needs related to cancer and cancer related treatments as evidenced by estimated needs.  GOAL:   Patient will meet greater than or equal to 90% of their needs  MONITOR:   PO intake, Supplement acceptance, Labs, Weight trends, I & O's  REASON FOR ASSESSMENT:   Malnutrition Screening Tool    ASSESSMENT:   71 y.o. male with medical history significant of bladder cancer with nephrostomy tubes placed by Urology, CKD, arthritis, HTN. Presented with one week hx of malaise, weakness, nausea, diarrhea. Pt reported so weak, he was essentially bedbound over the past week.  Patient undergoing chemotherapy for bladder cancer, last treatment 2/15. Pt and family reporting vomiting blood for 10 days PTA and diarrhea x 2 days.  Consuming full liquids at this time. Ensure and Boost Breeze have both been ordered. Not accepting either one. Will d/c Boost Breeze at this time.  Per weight records, weights have remained stable.  Medications reviewed.  Labs reviewed.   NUTRITION - FOCUSED PHYSICAL EXAM:  Unable to complete, working remotely.   Diet Order:   Diet Order             Diet full liquid Room service appropriate? Yes; Fluid consistency: Thin  Diet effective now                   EDUCATION NEEDS:   No education needs have been identified at this time  Skin:  Skin Assessment: Reviewed RN Assessment  Last BM:  2/25 -type 7  Height:   Ht Readings from Last 1 Encounters:  07/06/21 5\' 3"  (1.6 m)    Weight:   Wt Readings from Last 1 Encounters:  07/06/21 66.7 kg    BMI:  Body mass index is 26.04 kg/m.  Estimated Nutritional Needs:   Kcal:  2000-2200  Protein:  100-110g  Fluid:  2L/day  Clayton Bibles, MS, RD, LDN Inpatient Clinical  Dietitian Contact information available via Amion

## 2021-07-08 NOTE — Progress Notes (Signed)
Pt being seen d/t hematemesis and nephrostomy complications. Pt is resting in stretcher in NAD. Pt provided morning medications. Pt denies CP, SOB, N/V/D and is Afebrile. Pt family at bedside. Pt and family denies any further needs at this time. Bed locked in lowest position call bell within reach.

## 2021-07-09 ENCOUNTER — Inpatient Hospital Stay (HOSPITAL_COMMUNITY): Payer: Medicare Other

## 2021-07-09 HISTORY — PX: IR NEPHROSTOMY EXCHANGE LEFT: IMG6069

## 2021-07-09 HISTORY — PX: IR NEPHROSTOMY EXCHANGE RIGHT: IMG6070

## 2021-07-09 LAB — TYPE AND SCREEN
ABO/RH(D): A POS
Antibody Screen: NEGATIVE
Unit division: 0

## 2021-07-09 LAB — COMPREHENSIVE METABOLIC PANEL
ALT: 117 U/L — ABNORMAL HIGH (ref 0–44)
AST: 51 U/L — ABNORMAL HIGH (ref 15–41)
Albumin: 2.2 g/dL — ABNORMAL LOW (ref 3.5–5.0)
Alkaline Phosphatase: 56 U/L (ref 38–126)
Anion gap: 2 — ABNORMAL LOW (ref 5–15)
BUN: 28 mg/dL — ABNORMAL HIGH (ref 8–23)
CO2: 22 mmol/L (ref 22–32)
Calcium: 7.8 mg/dL — ABNORMAL LOW (ref 8.9–10.3)
Chloride: 112 mmol/L — ABNORMAL HIGH (ref 98–111)
Creatinine, Ser: 1.77 mg/dL — ABNORMAL HIGH (ref 0.61–1.24)
GFR, Estimated: 41 mL/min — ABNORMAL LOW (ref >60)
Glucose, Bld: 104 mg/dL — ABNORMAL HIGH (ref 70–99)
Potassium: 3.9 mmol/L (ref 3.5–5.1)
Sodium: 136 mmol/L (ref 135–145)
Total Bilirubin: 0.1 mg/dL — ABNORMAL LOW (ref 0.3–1.2)
Total Protein: 5.6 g/dL — ABNORMAL LOW (ref 6.5–8.1)

## 2021-07-09 LAB — BPAM RBC
Blood Product Expiration Date: 202303182359
ISSUE DATE / TIME: 202302250630
Unit Type and Rh: 6200

## 2021-07-09 LAB — URINE CULTURE: Culture: >=100000 — AB

## 2021-07-09 LAB — CBC
HCT: 24.6 % — ABNORMAL LOW (ref 39.0–52.0)
Hemoglobin: 7.8 g/dL — ABNORMAL LOW (ref 13.0–17.0)
MCH: 27.1 pg (ref 26.0–34.0)
MCHC: 31.7 g/dL (ref 30.0–36.0)
MCV: 85.4 fL (ref 80.0–100.0)
Platelets: 580 10*3/uL — ABNORMAL HIGH (ref 150–400)
RBC: 2.88 MIL/uL — ABNORMAL LOW (ref 4.22–5.81)
RDW: 15.9 % — ABNORMAL HIGH (ref 11.5–15.5)
WBC: 2 10*3/uL — ABNORMAL LOW (ref 4.0–10.5)
nRBC: 3 % — ABNORMAL HIGH (ref 0.0–0.2)

## 2021-07-09 MED ORDER — LIDOCAINE HCL 1 % IJ SOLN
INTRAMUSCULAR | Status: DC | PRN
  Administered 2021-07-09: 5 mL

## 2021-07-09 MED ORDER — IOHEXOL 300 MG/ML  SOLN
50.0000 mL | Freq: Once | INTRAMUSCULAR | Status: AC | PRN
  Administered 2021-07-09: 15 mL

## 2021-07-09 MED ORDER — CIPROFLOXACIN HCL 500 MG PO TABS: 500.0000 mg | ORAL_TABLET | Freq: Two times a day (BID) | ORAL | 0 refills | Status: AC

## 2021-07-09 MED ORDER — CIPROFLOXACIN IN D5W 400 MG/200ML IV SOLN: 400.0000 mg | Freq: Two times a day (BID) | INTRAVENOUS | Status: DC

## 2021-07-09 MED ORDER — CIPROFLOXACIN HCL 500 MG PO TABS
500.0000 mg | ORAL_TABLET | Freq: Two times a day (BID) | ORAL | Status: DC
  Administered 2021-07-09: 500 mg via ORAL
  Filled 2021-07-09: qty 1

## 2021-07-09 MED ORDER — HEPARIN SOD (PORK) LOCK FLUSH 100 UNIT/ML IV SOLN
500.0000 [IU] | INTRAVENOUS | Status: AC | PRN
  Administered 2021-07-09: 500 [IU]
  Filled 2021-07-09: qty 5

## 2021-07-09 MED ORDER — LIDOCAINE HCL 1 % IJ SOLN
INTRAMUSCULAR | Status: AC
  Filled 2021-07-09: qty 20

## 2021-07-09 NOTE — Discharge Summary (Signed)
Physician Discharge Summary   Patient: Samuel [name] MRN: [id number] DOB: Apr 02, 1951  Admit date:     07/06/2021  Discharge date: 07/09/21  Discharge Physician: Marylu Lund   PCP: Dorothyann Peng, NP   Recommendations at discharge:    Follow up with PCP in 1-2 weeks Follow up with Oncology as scheduled  Discharge Diagnoses: Principal Problem:   UTI (urinary tract infection) Active Problems:   Essential hypertension   Tobacco use   Hyperlipidemia   COPD (chronic obstructive pulmonary disease) (HCC)   Nocturnal hypoxemia due to emphysema (HCC)   Acute renal failure superimposed on stage 3b chronic kidney disease (HCC)   Acute lower UTI   Acute on chronic anemia   Leukopenia  Resolved Problems:   * No resolved hospital problems. *   Hospital Course: 71 y.o. male with medical history significant of bladder cancer with nephrostomy tubes placed by Urology, CKD, arthritis, HTN. Presented with one week hx of malaise, weakness, nausea, diarrhea. Pt reported so weak, he was essentially bedbound over the past week. Pt was seen by Urology,who treated pt with bactrim for UTI, completing 7-8 day course. Symptoms did not improve, thus pt presented to ED. Pt noted to have temp of 102F on early AM of admit.  Assessment and Plan: * UTI (urinary tract infection)- (present on admission) - UA reviewed, suggestive of UTI - Pt was continued on cefepime initially - Urine cx pos for pseudomonas from R nephrostomy tube -Pt reported feeling much better - abx was changed to cipro per culture results - IR was consulted and B nephrostomies were changed 2/27  Leukopenia -Likely secondary to recent chemotherapy -WBC improved to 2.0 at time of d/c. Pt remained stable  Acute on chronic anemia -likely related to malignancy and recent chemo -hgb down to 6.9 this visit, improved with 1 unit PRBC transfusion -Hgb had since remained stable the remainder of visit  Acute renal failure  superimposed on stage 3b chronic kidney disease ([place])- (present on admission) -cont IVF as tolerated -Renal function improved with hydration  Nocturnal hypoxemia due to emphysema ([place])- (present on admission) -Appears stable at this time.  -No audible wheezing on exam  COPD (chronic obstructive pulmonary disease) ([place])- (present on admission) -Clear on exam, on minimal O2 support at this time  Hyperlipidemia- (present on admission) -Cont home statin per home regimen  Tobacco use- (present on admission) -Pt quit smoking 27yrs ago.  -Pt congratulated  Essential hypertension- (present on admission) -BP had been stable -Hemodynamically stable and pt is conversing and appearing well -Cont regimen as tolerated          Consultants: IR Procedures performed: Nephrostomy tube exchange, bilateral. 2/27  Disposition: Home Diet recommendation:  Regular diet  DISCHARGE MEDICATION: Allergies as of 07/09/2021       Reactions   Penicillins Diarrhea, Nausea And Vomiting   Did it involve swelling of the face/tongue/throat, SOB, or low BP? No Did it involve sudden or severe rash/hives, skin peeling, or any reaction on the inside of your mouth or nose?  Did you need to seek medical attention at a hospital or doctor's office? No When did it last happen?    Several Years Ago   If all above answers are "NO", may proceed with cephalosporin use.        Medication List     TAKE these medications    acetaminophen 325 MG tablet Commonly known as: TYLENOL Take 325 mg by mouth every 6 (six) hours as needed for moderate  pain or fever.   albuterol (2.5 MG/3ML) 0.083% nebulizer solution Commonly known as: PROVENTIL Take 2.5 mg by nebulization in the morning and at bedtime.   albuterol 108 (90 Base) MCG/ACT inhaler Commonly known as: VENTOLIN HFA Inhale 2 puffs into the lungs every 6 (six) hours as needed for wheezing or shortness of breath.   amLODipine 10 MG tablet Commonly known  as: NORVASC Take 10 mg by mouth at bedtime.   aspirin EC 81 MG tablet Take 1 tablet (81 mg total) by mouth daily. Swallow whole.   atorvastatin 40 MG tablet Commonly known as: LIPITOR Take 1 tablet (40 mg total) by mouth daily.   B-12 PO Take 1 capsule by mouth daily.   Breztri Aerosphere 160-9-4.8 MCG/ACT Aero Generic drug: Budeson-Glycopyrrol-Formoterol Inhale 2 puffs into the lungs in the morning and at bedtime.   ciprofloxacin 500 MG tablet Commonly known as: CIPRO Take 1 tablet (500 mg total) by mouth 2 (two) times daily for 7 days.   citalopram 20 MG tablet Commonly known as: CELEXA Take 1 tablet (20 mg total) by mouth daily. What changed: when to take this   finasteride 5 MG tablet Commonly known as: PROSCAR Take 5 mg by mouth daily.   lidocaine-prilocaine cream Commonly known as: EMLA Apply 1 application topically as needed.   metoprolol succinate 25 MG 24 hr tablet Commonly known as: TOPROL-XL Take 25 mg by mouth daily.   oxyCODONE 5 MG immediate release tablet Commonly known as: Oxy IR/ROXICODONE Take 1 tablet (5 mg total) by mouth every 6 (six) hours as needed for moderate pain or severe pain (Post-operatively).   OXYGEN Inhale 2 L into the lungs See admin instructions. 2L when taking a nap and at bedtime   prochlorperazine 10 MG tablet Commonly known as: COMPAZINE TAKE 1 TABLET(10 MG) BY MOUTH EVERY 6 HOURS AS NEEDED FOR NAUSEA OR VOMITING   tamsulosin 0.4 MG Caps capsule Commonly known as: FLOMAX Take 2 capsules by mouth at bedtime.   VITAMIN D3 PO Take 1 tablet by mouth daily.         Follow-up Information     [name], [name], NP Follow up in 2 week(s).   Specialty: Family Medicine Why: Hospital follow up Contact information: [place] [address] [phone]         Wyatt Portela, MD Follow up.   Specialty: Oncology Why: as scheduled Contact information: [place]  [address] [phone]         outpatient Physical therapy. Schedule an appointment as soon as possible for a visit.                  Discharge Exam: Filed Weights   07/06/21 1150  Weight: 66.7 kg   General exam: Awake, laying in bed, in nad Respiratory system: Normal respiratory effort, no wheezing Cardiovascular system: regular rate, s1, s2 Gastrointestinal system: Soft, nondistended, positive BS Central nervous system: CN2-12 grossly intact, strength intact Extremities: Perfused, no clubbing Skin: Normal skin turgor, no notable skin lesions seen Psychiatry: Mood normal // no visual hallucinations   Condition at discharge: good  The results of significant diagnostics from this hospitalization (including imaging, microbiology, ancillary and laboratory) are listed below for reference.   Imaging Studies: NM PET Image Initial (PI) Skull Base To Thigh  Result Date: 06/18/2021 CLINICAL DATA:  Subsequent treatment strategy for bladder malignancy. EXAM: NUCLEAR MEDICINE PET SKULL BASE TO THIGH TECHNIQUE: 7.4 mCi F-18 FDG was injected intravenously. Full-ring  PET imaging was performed from the skull base to thigh after the radiotracer. CT data was obtained and used for attenuation correction and anatomic localization. Fasting blood glucose: 102 mg/dl COMPARISON:  CT scan 03/30/2021 FINDINGS: Mediastinal blood pool activity: SUV max 2.4 Liver activity: SUV max NA NECK: Physiologic muscular activity in the neck. Incidental CT findings: Mild atherosclerotic calcification of both common carotid arteries. CHEST: The lungs appear clear. Cardiac and mediastinal contours normal. No pleural effusion identified. 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 Appearance compatible with [place] syndrome. Coronary, aortic arch, and branch vessel atherosclerotic vascular disease. Small type 1 hiatal hernia.  Prominent centrilobular emphysema.  Mild lingular scarring. ABDOMEN/PELVIS: Extensive hypermetabolic tumor in the urinary bladder. Representative SUV in the vicinity of bladder tumor with maximum SUV of 17.4. This is mostly posterior but also extends around superiorly and inferiorly in the urinary bladder and is also present in the vicinity of the ureterovesical junctions. There is hypermetabolic retrocrural, retrocaval, periaortic common iliac, bilateral external iliac, left internal iliac adenopathy. A left common iliac node measuring 2.3 cm in short axis on image 149 of series 4 (formerly 0.5 cm) has a maximum SUV of 8.9. A left pelvic sidewall lymph node measuring 1.6 cm in short axis on image 64 series 3 (formerly 1.1 cm) has a maximum SUV of 11.4. A fusiform 1.4 by 1.1 cm mass along the right sciatic nerve on image 190 of series 4 a maximum SUV of 3.7, and is not appreciably changed from 03/30/2021. 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 Along the sigmoid colon as shown on image 56 of series 605, there is a focus of abnormal hypermetabolic activity with maximum SUV 11.3. This seems somewhat high to be inflammatory although there may be some mild inflammatory stranding along a diverticulum in this region. An underlying polyp would be a differential diagnostic consideration. Accentuated activity along the obturator internus muscles, especially on the left, likely physiologic given the lack of CT abnormality. Incidental CT findings: Bilateral nephrostomies noted. Bilateral hypodense renal lesions are probably cysts. Atherosclerosis is present, including aortoiliac atherosclerotic disease. Left gluteus medius lipoma. Cutaneous ulceration or lesion along the left back at about the L1 level on image 114 series 4 common no change from 09/27/2020, without substantial hypermetabolic activity. SKELETON: Focal mild hypermetabolic activity posteriorly in the T12 vertebral body associated  with mild focal sclerosis measuring about 0.8 by 0.6 cm on image 107 series 4. This has a maximum SUV of 6.2. Mild focal activity in the right lateral fourth rib without underlying CT abnormality, as shown on image 152 series 605, maximum SUV 3.8. Incidental CT findings: Costosternal deformities related to [place] syndrome on the left. IMPRESSION: 1. Large hypermetabolic bladder mass associated with pelvic and upper abdominal hypermetabolic adenopathy compatible with metastatic disease. Adenopathy has worsened compared to 03/30/2021. 2. Small sclerotic lesion posteriorly in the T12 vertebral body has associated accentuated metabolic activity, bony oligometastatic disease not excluded. 3. Right lateral fourth rib focal activity without underlying CT abnormality, significance uncertain. 4. Oval-shaped mass along the right sciatic nerve has mildly accentuated metabolic activity with maximum SUV 3.7. 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 5. Other imaging findings of potential clinical significance: Aortic Atherosclerosis (ICD10-I70.0) and Emphysema (ICD10-J43.9). 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 Small type 1 hiatal hernia. Coronary atherosclerosis. Bilateral nephrostomies. Left gluteus medius  lipoma. Chronic cutaneous ulceration along the left lower back. Electronically Signed   By: Van Clines M.D.   On: 06/18/2021 09:34   DG Chest Port 1 View  Result Date: 07/06/2021 CLINICAL DATA:  Vomiting blood with eating for 10 days, diarrhea for 2 days, has BILATERAL nephrostomy tubes EXAM: PORTABLE CHEST 1 VIEW COMPARISON:  Portable exam 1645 hours compared to 06/23/2021 FINDINGS: RIGHT jugular Port-A-Cath with tip projecting over cavoatrial junction. Normal heart size, mediastinal contours, and pulmonary vascularity. Subsegmental atelectasis at LEFT base. Suspect underlying emphysematous changes. No infiltrate, pleural effusion, or  pneumothorax. Bones demineralized. IMPRESSION: Probable emphysematous changes with subsegmental atelectasis LEFT base. Electronically Signed   By: Lavonia Dana M.D.   On: 07/06/2021 16:57   DG Chest Port 1 View  Result Date: 06/23/2021 CLINICAL DATA:  Fever, chest port placed on Monday, chemotherapy started on Wednesday EXAM: PORTABLE CHEST 1 VIEW COMPARISON:  03/29/2021 FINDINGS: Lungs are clear.  No pleural effusion or pneumothorax. The heart is normal in size. Right chest power port terminates at the cavoatrial junction. IMPRESSION: No evidence of acute cardiopulmonary disease. Electronically Signed   By: Julian Hy M.D.   On: 06/23/2021 03:37   IR NEPHROSTOMY EXCHANGE LEFT  Result Date: 07/09/2021 INDICATION: 71 year old male with history of urinary tract infection, currently admitted, with Pseudomonas growing from indwelling right nephrostomy tube. EXAM: FLUOROSCOPIC GUIDED BILATERAL SIDED NEPHROSTOMY CATHETER EXCHANGE COMPARISON:  07/09/2021, 06/18/2021 CONTRAST:  A total of 15 mL Isovue-300 administered was administered into both collecting systems FLUOROSCOPY TIME:  One minute 48 seconds, 6 mGy COMPLICATIONS: None immediate. TECHNIQUE: Informed written consent was obtained from the patient after a discussion of the risks, benefits and alternatives to treatment. Questions regarding the procedure were encouraged and answered. A timeout was performed prior to the initiation of the procedure. The bilateral flanks and external portions of existing nephrostomy catheters were prepped and draped in the usual sterile fashion. A sterile drape was applied covering the operative field. Maximum barrier sterile technique with sterile gowns and gloves were used for the procedure. A timeout was performed prior to the initiation of the procedure. A pre procedural spot fluoroscopic image was obtained. Beginning with the left-sided nephrostomy, a small amount of contrast was injected via the existing left-sided  nephrostomy catheter demonstrating appropriate positioning within the renal pelvis. The existing nephrostomy catheter was cut and cannulated with a Benson wire which was coiled within the renal pelvis. Under intermittent fluoroscopic guidance, the existing nephrostomy catheter was exchanged for a new 10.2 [place] all-purpose drainage catheter. Limited contrast injection confirmed appropriate positioning within the left renal pelvis and a post exchange fluoroscopic image was obtained. The catheter was locked, secured to the skin with an interrupted suture and reconnected to a gravity bag. The identical repeat procedure was repeated for the contralateral right-sided nephrostomy, ultimately allowing successful exchange of a new 10.2 [place] all-purpose drainage catheter with end coiled and locked within the right renal pelvis. Dressings were placed. The patient tolerated the above procedures well without immediate postprocedural complication. FINDINGS: The existing nephrostomy catheters are appropriately positioned and functioning. After successful fluoroscopic guided exchange, new bilateral 10.2French nephrostomy catheters are coiled and locked within the respective renal pelvises. IMPRESSION: Successful fluoroscopic guided exchange of bilateral 10.2 French percutaneous nephrostomy catheters. Ruthann Cancer, MD [facility] Radiology Electronically Signed   By: Ruthann Cancer M.D.   On: 07/09/2021 16:04   IR NEPHROSTOMY EXCHANGE LEFT  Result Date: 06/18/2021 INDICATION: Bladder cancer, chronic indwelling nephrostomy EXAM:  FLUOROSCOPIC EXCHANGE OF THE BILATERAL NEPHROSTOMIES COMPARISON:  03/30/2021 MEDICATIONS: 1% LIDOCAINE LOCAL ANESTHESIA/SEDATION: Moderate (conscious) sedation was employed during this procedure. A total of Versed 1 mg and Fentanyl 50 mcg was administered intravenously by the radiology nurse. Total intra-service moderate Sedation Time: 10 minutes. The  patient's level of consciousness and vital signs were monitored continuously by radiology nursing throughout the procedure under my direct supervision. CONTRAST:  Ten-administered into the collecting system(s) FLUOROSCOPY: Radiation Exposure Index (as provided by the fluoroscopic device): 6.8 mGy Kerma COMPLICATIONS: None immediate. PROCEDURE: Informed written consent was obtained from the patient after a thorough discussion of the procedural risks, benefits and alternatives. All questions were addressed. Maximal Sterile Barrier Technique was utilized including caps, mask, sterile gowns, sterile gloves, sterile drape, hand hygiene and skin antiseptic. A timeout was performed prior to the initiation of the procedure. Under sterile conditions and local anesthesia, the existing bilateral 10 French nephrostomies were exchanged over Amplatz guidewires. Retention loops formed the renal pelvis bilaterally. Contrast injection confirms position. Images obtained for documentation of the exchanges. Catheters secured with Prolene sutures and connected to external gravity drainage bags. No immediate complication. Patient tolerated the procedure well. IMPRESSION: Successful bilateral 10 [place] nephrostomy exchanges Electronically Signed   By: Jerilynn Mages.  Shick M.D.   On: 06/18/2021 15:33   IR NEPHROSTOMY EXCHANGE RIGHT  Result Date: 07/09/2021 INDICATION: 71 year old male with history of urinary tract infection, currently admitted, with Pseudomonas growing from indwelling right nephrostomy tube. EXAM: FLUOROSCOPIC GUIDED BILATERAL SIDED NEPHROSTOMY CATHETER EXCHANGE COMPARISON:  07/09/2021, 06/18/2021 CONTRAST:  A total of 15 mL Isovue-300 administered was administered into both collecting systems FLUOROSCOPY TIME:  One minute 48 seconds, 6 mGy COMPLICATIONS: None immediate. TECHNIQUE: Informed written consent was obtained from the patient after a discussion of the risks, benefits and alternatives to treatment. Questions regarding  the procedure were encouraged and answered. A timeout was performed prior to the initiation of the procedure. The bilateral flanks and external portions of existing nephrostomy catheters were prepped and draped in the usual sterile fashion. A sterile drape was applied covering the operative field. Maximum barrier sterile technique with sterile gowns and gloves were used for the procedure. A timeout was performed prior to the initiation of the procedure. A pre procedural spot fluoroscopic image was obtained. Beginning with the left-sided nephrostomy, a small amount of contrast was injected via the existing left-sided nephrostomy catheter demonstrating appropriate positioning within the renal pelvis. The existing nephrostomy catheter was cut and cannulated with a Benson wire which was coiled within the renal pelvis. Under intermittent fluoroscopic guidance, the existing nephrostomy catheter was exchanged for a new 10.2 [place] all-purpose drainage catheter. Limited contrast injection confirmed appropriate positioning within the left renal pelvis and a post exchange fluoroscopic image was obtained. The catheter was locked, secured to the skin with an interrupted suture and reconnected to a gravity bag. The identical repeat procedure was repeated for the contralateral right-sided nephrostomy, ultimately allowing successful exchange of a new 10.2 [place] all-purpose drainage catheter with end coiled and locked within the right renal pelvis. Dressings were placed. The patient tolerated the above procedures well without immediate postprocedural complication. FINDINGS: The existing nephrostomy catheters are appropriately positioned and functioning. After successful fluoroscopic guided exchange, new bilateral 10.2French nephrostomy catheters are coiled and locked within the respective renal pelvises. IMPRESSION: Successful fluoroscopic guided exchange of bilateral 10.2 French percutaneous nephrostomy catheters. Ruthann Cancer,  MD [facility] Radiology Electronically Signed   By: Glade Nurse.D.  On: 07/09/2021 16:04   IR NEPHROSTOMY EXCHANGE RIGHT  Result Date: 06/18/2021 INDICATION: Bladder cancer, chronic indwelling nephrostomy EXAM: FLUOROSCOPIC EXCHANGE OF THE BILATERAL NEPHROSTOMIES COMPARISON:  03/30/2021 MEDICATIONS: 1% LIDOCAINE LOCAL ANESTHESIA/SEDATION: Moderate (conscious) sedation was employed during this procedure. A total of Versed 1 mg and Fentanyl 50 mcg was administered intravenously by the radiology nurse. Total intra-service moderate Sedation Time: 10 minutes. The patient's level of consciousness and vital signs were monitored continuously by radiology nursing throughout the procedure under my direct supervision. CONTRAST:  Ten-administered into the collecting system(s) FLUOROSCOPY: Radiation Exposure Index (as provided by the fluoroscopic device): 6.8 mGy Kerma COMPLICATIONS: None immediate. PROCEDURE: Informed written consent was obtained from the patient after a thorough discussion of the procedural risks, benefits and alternatives. All questions were addressed. Maximal Sterile Barrier Technique was utilized including caps, mask, sterile gowns, sterile gloves, sterile drape, hand hygiene and skin antiseptic. A timeout was performed prior to the initiation of the procedure. Under sterile conditions and local anesthesia, the existing bilateral 10 French nephrostomies were exchanged over Amplatz guidewires. Retention loops formed the renal pelvis bilaterally. Contrast injection confirms position. Images obtained for documentation of the exchanges. Catheters secured with Prolene sutures and connected to external gravity drainage bags. No immediate complication. Patient tolerated the procedure well. IMPRESSION: Successful bilateral 10 [place] nephrostomy exchanges Electronically Signed   By: Jerilynn Mages.  Shick M.D.   On: 06/18/2021 15:33   IR IMAGING GUIDED PORT  INSERTION  Result Date: 06/18/2021 CLINICAL DATA:  BLADDER CANCER, ACCESS FOR CHEMOTHERAPY EXAM: RIGHT INTERNAL JUGULAR SINGLE LUMEN POWER PORT CATHETER INSERTION Date:  06/18/2021 06/18/2021 4:19 pm Radiologist:  Jerilynn Mages. Daryll Brod, MD Guidance:  Ultrasound and fluoroscopic MEDICATIONS: 1% lidocaine local with epinephrine ANESTHESIA/SEDATION: Versed 3.5 mg IV; Fentanyl 100 mcg IV; Moderate Sedation Time:  30 The patient was continuously monitored during the procedure by the interventional radiology nurse under my direct supervision. FLUOROSCOPY: One minutes, 24 seconds (6 mGy) COMPLICATIONS: None immediate. CONTRAST:  None. PROCEDURE: Informed consent was obtained from the patient following explanation of the procedure, risks, benefits and alternatives. The patient understands, agrees and consents for the procedure. All questions were addressed. A time out was performed. Maximal barrier sterile technique utilized including caps, mask, sterile gowns, sterile gloves, large sterile drape, hand hygiene, and 2% chlorhexidine scrub. Under sterile conditions and local anesthesia, right internal jugular micropuncture venous access was performed. Access was performed with ultrasound. Images were obtained for documentation of the patent right internal jugular vein. A guide wire was inserted followed by a transitional dilator. This allowed insertion of a guide wire and catheter into the IVC. Measurements were obtained from the SVC / RA junction back to the right IJ venotomy site. In the right infraclavicular chest, a subcutaneous pocket was created over the second anterior rib. This was done under sterile conditions and local anesthesia. 1% lidocaine with epinephrine was utilized for this. A 2.5 cm incision was made in the skin. Blunt dissection was performed to create a subcutaneous pocket over the right pectoralis major muscle. The pocket was flushed with saline vigorously. There was adequate hemostasis. The port catheter was  assembled and checked for leakage. The port catheter was secured in the pocket with two retention sutures. The tubing was tunneled subcutaneously to the right venotomy site and inserted into the SVC/RA junction through a valved peel-away sheath. Position was confirmed with fluoroscopy. Images were obtained for documentation. The patient tolerated the procedure well. No immediate complications. Incisions were closed in a two layer fashion  with 4 - 0 Vicryl suture. Dermabond was applied to the skin. The port catheter was accessed, blood was aspirated followed by saline and heparin flushes. Needle was removed. A dry sterile dressing was applied. IMPRESSION: Ultrasound and fluoroscopically guided right internal jugular single lumen power port catheter insertion. Tip in the SVC/RA junction. Catheter ready for use. Electronically Signed   By: Jerilynn Mages.  Shick M.D.   On: 06/18/2021 16:27    Microbiology: Results for orders placed or performed during the hospital encounter of 07/06/21  Culture, blood (routine x 2)     Status: None (Preliminary result)   Collection Time: 07/06/21  1:02 PM   Specimen: BLOOD  Result Value Ref Range Status   Specimen Description   Final    BLOOD RIGHT ANTECUBITAL Performed at [facility] [address] [place], [place] [address]    Special Requests   Final    BOTTLES DRAWN AEROBIC AND ANAEROBIC Blood Culture results may not be optimal due to an excessive volume of blood received in culture bottles Performed at [facility] [address], [place], [place] [address]    Culture   Final    NO GROWTH 3 DAYS Performed at [facility], [place] [address] [place], [place] [address]    Report Status PENDING  Incomplete  Urine Culture     Status: Abnormal   Collection Time: 07/06/21  2:14 PM   Specimen: Urine, Suprapubic  Result Value Ref Range Status   Specimen Description   Final    URINE, SUPRAPUBIC Performed at [facility] [address], [place], [place] [address]    Special Requests   Final    RIGHT Performed at [facility], [place] [address] [place], [place] [address]    Culture >=100,000 COLONIES/mL PSEUDOMONAS PUTIDA (A)  Final   Report Status 07/09/2021 FINAL  Final   Organism ID, Bacteria PSEUDOMONAS PUTIDA (A)  Final      Susceptibility   Pseudomonas putida - MIC*    CEFTAZIDIME 16 INTERMEDIATE Intermediate     CIPROFLOXACIN 1 SENSITIVE Sensitive     GENTAMICIN <=1 SENSITIVE Sensitive     IMIPENEM 2 SENSITIVE Sensitive     PIP/TAZO >=128 RESISTANT Resistant     * >=100,000 COLONIES/mL PSEUDOMONAS PUTIDA  Urine Culture     Status: None   Collection Time: 07/06/21  2:20 PM   Specimen: Urine, Suprapubic  Result Value Ref Range Status   Specimen Description   Final    URINE, SUPRAPUBIC Performed at [facility] [address] [place], [place] [address]    Special Requests   Final    LEFT Performed at [facility], [place] [address] [place], [place] [address]    Culture   Final    NO GROWTH Performed at [facility], [place] [address] [place],  [address]    Report Status 07/08/2021 FINAL  Final  Resp Panel by RT-PCR (Flu A&B, Covid) Urine, Suprapubic     Status: None   Collection Time: 07/06/21  9:50 PM   Specimen: Urine, Suprapubic; Nasopharyngeal(NP) swabs in vial transport medium  Result Value Ref Range Status   SARS Coronavirus 2 by RT PCR NEGATIVE NEGATIVE Final    Comment: (NOTE) SARS-CoV-2 target nucleic acids are NOT DETECTED.  The SARS-CoV-2 RNA is generally detectable in upper respiratory specimens during the acute phase of infection. The lowest concentration of SARS-CoV-2 viral copies this assay can detect  is 138 copies/mL. A negative result does not preclude SARS-Cov-2 infection and should not be used as the sole basis for treatment or other patient management decisions. A negative result may  occur with  improper specimen collection/handling, submission of specimen other than nasopharyngeal swab, presence of viral mutation(s) within the areas targeted by this assay, and inadequate number of viral copies(<138 copies/mL). A negative result must be combined with clinical observations, patient history, and epidemiological information. The expected result is Negative.  Fact Sheet for Patients:  EntrepreneurPulse.com.au  Fact Sheet for Healthcare Providers:  IncredibleEmployment.be  This test is no t yet approved or cleared by the [place] FDA and  has been authorized for detection and/or diagnosis of SARS-CoV-2 by FDA under an Emergency Use Authorization (EUA). This EUA will remain  in effect (meaning this test can be used) for the duration of the COVID-19 declaration under Section 564(b)(1) of the Act, 21 U.S.C.section 360bbb-3(b)(1), unless the authorization is terminated  or revoked sooner.       Influenza A by PCR NEGATIVE NEGATIVE Final   Influenza B by PCR NEGATIVE NEGATIVE Final    Comment: (NOTE) The Xpert Xpress SARS-CoV-2/FLU/RSV plus assay is intended as an aid in the diagnosis of influenza from Nasopharyngeal swab specimens and should not be used as a sole basis for treatment. Nasal washings and aspirates are unacceptable for Xpert Xpress SARS-CoV-2/FLU/RSV testing.  Fact Sheet for Patients: EntrepreneurPulse.com.au  Fact Sheet for Healthcare Providers: IncredibleEmployment.be  This test is not yet approved or cleared by the [place] FDA and has been authorized for detection and/or diagnosis of SARS-CoV-2 by FDA under an Emergency Use Authorization (EUA). This EUA will remain in effect (meaning this test can be used) for the duration of the COVID-19 declaration under Section 564(b)(1) of the Act, 21 U.S.C. section 360bbb-3(b)(1), unless the authorization is terminated  or revoked.  Performed at [facility], [place] [address], [place], [place] [address]   Culture, blood (routine x 2)     Status: None (Preliminary result)   Collection Time: 07/07/21  4:46 AM   Specimen: BLOOD  Result Value Ref Range Status   Specimen Description   Final    BLOOD RIGHT HAND Performed at [facility] [address] [place], [place] [address]    Special Requests   Final    BOTTLES DRAWN AEROBIC ONLY Blood Culture adequate volume Performed at [facility] [address], [place], [place] [address]    Culture   Final    NO GROWTH 2 DAYS Performed at [facility] [address] [place], [place] [address]    Report Status PENDING  Incomplete    Labs: CBC: Recent Labs  Lab 07/06/21 1306 07/07/21 0446 07/07/21 2008 07/08/21 0322 07/09/21 0350  WBC 1.5* 1.2*  --  1.4* 2.0*  NEUTROABS 1.2*  --   --   --   --   HGB 8.6* 6.9* 7.8* 7.8* 7.8*  HCT 27.3* 21.6* 24.1* 24.2* 24.6*  MCV 84.0 84.4  --  84.3 85.4  PLT 158 208  --  367 102*   Basic Metabolic Panel: Recent Labs  Lab 07/06/21 1306 07/07/21 0446 07/08/21 0322 07/09/21 0350  NA 134* 131* 136 136  K 3.8 3.5 4.0 3.9  CL 101 104 110 112*  CO2 21* 18* 22 22  GLUCOSE 115* 88 103* 104*  BUN 37* 33* 31* 28*  CREATININE 2.80* 2.41* 1.90* 1.77*  CALCIUM 8.6* 7.7* 7.9* 7.8*  Liver Function Tests: Recent Labs  Lab 07/06/21 1306 07/07/21 0446 07/08/21 0322 07/09/21 0350  AST 138* 158* 102* 51*  ALT 162* 191* 162* 117*  ALKPHOS 83 62 56 56  BILITOT 0.3 0.2* 0.2* 0.1*  PROT 7.8 5.9* 5.4* 5.6*  ALBUMIN 3.2* 2.6* 2.2* 2.2*   CBG: No results for input(s): GLUCAP in the last 168 hours.  Discharge time spent: less than 30 minutes.  Signed: Marylu Lund, MD [facility] 07/09/2021

## 2021-07-09 NOTE — Procedures (Signed)
Interventional Radiology Procedure Note  Procedure: Bilateral nephrostomy tube exchange  Findings: Please refer to procedural dictation for full description.  Complications: None immediate  Estimated Blood Loss: < 65mL  Recommendations: Keep to bag drainage. IR has arranged outpatient follow up for routine nephrostomy check/change in 6 weeks.   Ruthann Cancer, MD Pager: [phone]

## 2021-07-09 NOTE — TOC Initial Note (Signed)
Transition of Care [facility]) - Initial/Assessment Note    Patient Details  Name: Samuel [name] MRN: [id number] Date of Birth: 1950-05-14  Transition of [facility]) CM/SW Contact:    Samuel Phi, RN Phone Number: 07/09/2021, 10:25 AM  Clinical Narrative:  From Home w/spouse-PT recc HHPT, will f/u on agency.                Expected Discharge Plan: March ARB Barriers to Discharge: Continued Medical Work up   Patient Goals and CMS Choice Patient states their goals for this hospitalization and ongoing recovery are:: Home CMS Medicare.gov Compare Post Acute Care list provided to:: Patient Represenative (must comment) (Samuel [name] spouse [phone]) Choice offered to / list presented to : Spouse  Expected Discharge Plan and Services Expected Discharge Plan: [place]   Discharge Planning Services: CM Consult Post Acute Care Choice: [place] arrangements for the past 2 months: Single Family Home                                      Prior Living Arrangements/Services Living arrangements for the past 2 months: Single Family Home Lives with:: Spouse Patient language and need for interpreter reviewed:: Yes Do you feel safe going back to the place where you live?: Yes      Need for Family Participation in Patient Care: Yes (Comment) Care giver support system in place?: Yes (comment)   Criminal Activity/Legal Involvement Pertinent to Current Situation/Hospitalization: No - Comment as needed  Activities of Daily Living Home Assistive Devices/Equipment: Eyeglasses ADL Screening (condition at time of admission) Patient's cognitive ability adequate to safely complete daily activities?: Yes Is the patient deaf or have difficulty hearing?: No Does the patient have difficulty seeing, even when wearing glasses/contacts?: No Does the patient have difficulty concentrating, remembering, or making decisions?: No Patient able to  express need for assistance with ADLs?: Yes Does the patient have difficulty dressing or bathing?: Yes Independently performs ADLs?: No Communication: Independent Dressing (OT): Needs assistance Is this a change from baseline?: Change from baseline, expected to last >3 days Grooming: Needs assistance Is this a change from baseline?: Pre-admission baseline Feeding: Needs assistance Is this a change from baseline?: Pre-admission baseline Bathing: Needs assistance Is this a change from baseline?: Pre-admission baseline Toileting: Needs assistance Is this a change from baseline?: Pre-admission baseline In/Out Bed: Needs assistance Is this a change from baseline?: Pre-admission baseline Walks in Home: Needs assistance Is this a change from baseline?: Pre-admission baseline Does the patient have difficulty walking or climbing stairs?: Yes Weakness of Legs: Both Weakness of Arms/Hands: Both  Permission Sought/Granted Permission sought to share information with : Case Manager Permission granted to share information with : Yes, Verbal Permission Granted  Share Information with NAME: Case manager           Emotional Assessment              Admission diagnosis:  UTI (urinary tract infection) [N39.0] AKI (acute kidney injury) ([place]) [N17.9] Fever, unspecified fever cause [R50.9] Neutropenia, unspecified type ([place]) [D70.9] Patient Active Problem List   Diagnosis Date Noted   Acute on chronic anemia 07/07/2021   Leukopenia 07/07/2021   UTI (urinary tract infection) 07/06/2021   Acute lower UTI 03/31/2021   Lower obstructive uropathy 03/30/2021   Acute renal failure superimposed on stage 3b chronic kidney disease ([place]) 03/30/2021   Bladder  cancer ([place]) 03/30/2021   Lightheadedness 03/29/2021   Centrilobular emphysema (HCC)    COPD (chronic obstructive pulmonary disease) (HCC)    Nocturnal hypoxemia due to emphysema (HCC)    Shortness of breath on exertion    Hyperlipidemia  12/19/2017   Depression    Essential hypertension 11/15/2016   Tobacco use 11/15/2016   BPH without urinary obstruction 11/15/2016   PCP:  Samuel Peng, NP Pharmacy:   [facility] [place], [place] - [address] [place] [address] Phone: [phone] Fax: [phone]     Social Determinants of Health (SDOH) Interventions    Readmission Risk Interventions Readmission Risk Prevention Plan 07/09/2021  Transportation Screening Complete  Medication Review (RN Care Manager) Complete  PCP or Specialist appointment within 3-5 days of discharge Complete  HRI or [place] Complete  SW Recovery Care/Counseling Consult Complete  [place] Not Applicable  Some recent data might be hidden

## 2021-07-09 NOTE — TOC Transition Note (Signed)
Transition of Care [facility]) - CM/SW Discharge Note   Patient Details  Name: Samuel [name] MRN: [id number] Date of Birth: 06-Jan-1951  Transition of Care [facility]) CM/SW Contact:  Dessa Phi, RN Phone Number: 07/09/2021, 1:53 PM   Clinical Narrative:  PT recc HHPT-unsucessfult w/HHC agency d/t location-[place]-no [place] agency to provide [place] services in that area-[facility]-unable to provide servies. Patient/family agree to outptatient PT-MD notifed to provide manual script to given to patient. No further CM needs.     Final next level of care: OP Rehab Barriers to Discharge: No Barriers Identified   Patient Goals and CMS Choice Patient states their goals for this hospitalization and ongoing recovery are:: Home CMS Medicare.gov Compare Post Acute Care list provided to:: Patient Represenative (must comment) ([place]othe spouse [phone]) Choice offered to / list presented to : Spouse  Discharge Placement                       Discharge Plan and Services   Discharge Planning Services: CM Consult Post Acute Care Choice: Home Health                               Social Determinants of Health (SDOH) Interventions     Readmission Risk Interventions Readmission Risk Prevention Plan 07/09/2021  Transportation Screening Complete  Medication Review ([place]) Complete  PCP or Specialist appointment within 3-5 days of discharge Complete  HRI or [place] Complete  SW Recovery Care/Counseling Consult Complete  [place] Not Applicable  Some recent data might be hidden

## 2021-07-09 NOTE — Progress Notes (Signed)
Physical Therapy Treatment Patient Details Name: Samuel [name] MRN: [id number] DOB: 05-09-1951 Today's Date: 07/09/2021   History of Present Illness 71 yo male presents [facility] 2/24 wtih vomiting x10 days, diarrhea x2 days, decreased urine output. Workup for UTI, ARF. PMH includes nephrostomy tubes exchange 06/18/2020, bladder cancer on chemo as recent as last week and s/p transurethral resection bladder tumor 5 and 04/2021, OA, CKD, HTN, COPD, L hand congenial deformity.    PT Comments    Pt is progressing well today. DOE 2-3/4 with amb x 80', with sats 98% on RA. Reviewed activity progression at home, pt will benefit from HHPT as he is still weaker/decr endurance compared to his baseline.   Recommendations for follow up therapy are one component of a multi-disciplinary discharge planning process, led by the attending physician.  Recommendations may be updated based on patient status, additional functional criteria and insurance authorization.  Follow Up Recommendations  Home health PT     Assistance Recommended at Discharge Intermittent Supervision/Assistance  Patient can return home with the following A little help with walking and/or transfers;A little help with bathing/dressing/bathroom;Assist for transportation;Help with stairs or ramp for entrance;Direct supervision/assist for medications management;Assistance with cooking/housework   Equipment Recommendations  None recommended by PT    Recommendations for Other Services       Precautions / Restrictions Precautions Precautions: Fall Precaution Comments: bilat nephrostomy bags Restrictions Weight Bearing Restrictions: No     Mobility  Bed Mobility Overal bed mobility: Modified Independent                  Transfers   Equipment used: None Transfers: Sit to/from Stand Sit to Stand: Supervision, Min guard           General transfer comment: for safety, no physical assist     Ambulation/Gait Ambulation/Gait assistance: Min guard Gait Distance (Feet): 80 Feet Assistive device: None Gait Pattern/deviations: Step-through pattern, Trunk flexed       General Gait Details: cues for breathing, self monitor fatigue level and DOE; overall 2-3/4 with SpO2=98% on RA   Stairs             Wheelchair Mobility    Modified Rankin (Stroke Patients Only)       Balance   Sitting-balance support: No upper extremity supported, Feet supported Sitting balance-Leahy Scale: Good     Standing balance support: No upper extremity supported, During functional activity Standing balance-Leahy Scale: Fair (fair to good, NT to mod perturbations)                              Cognition Arousal/Alertness: Awake/alert Behavior During Therapy: WFL for tasks assessed/performed Overall Cognitive Status: Within Functional Limits for tasks assessed                                          Exercises      General Comments        Pertinent Vitals/Pain Pain Assessment Pain Assessment: No/denies pain    Home Living                          Prior Function            PT Goals (current goals can now be found in the care plan section) Acute Rehab PT Goals Patient  Stated Goal: home when able PT Goal Formulation: With patient/family Time For Goal Achievement: 07/21/21 Potential to Achieve Goals: Good Progress towards PT goals: Progressing toward goals    Frequency    Min 3X/week      PT Plan Current plan remains appropriate    Co-evaluation              AM-PAC PT "6 Clicks" Mobility   Outcome Measure  Help needed turning from your back to your side while in a flat bed without using bedrails?: A Little Help needed moving from lying on your back to sitting on the side of a flat bed without using bedrails?: A Little Help needed moving to and from a bed to a chair (including a wheelchair)?: A Little Help  needed standing up from a chair using your arms (e.g., wheelchair or bedside chair)?: A Little Help needed to walk in hospital room?: A Little Help needed climbing 3-5 steps with a railing? : A Little 6 Click Score: 18    End of Session Equipment Utilized During Treatment: Gait belt Activity Tolerance: Patient tolerated treatment well Patient left: in chair;with chair alarm set;with call bell/phone within reach;with family/visitor present Nurse Communication: Mobility status PT Visit Diagnosis: Other abnormalities of gait and mobility (R26.89);Muscle weakness (generalized) (M62.81)     Time:                      Baxter Flattery, PT  Acute Rehab Dept [facility]) [phone] Pager [phone]  07/09/2021    [facility] 07/09/2021, 12:28 PM

## 2021-07-11 ENCOUNTER — Other Ambulatory Visit: Payer: Self-pay

## 2021-07-11 ENCOUNTER — Inpatient Hospital Stay: Payer: Medicare Other | Attending: Oncology

## 2021-07-11 ENCOUNTER — Inpatient Hospital Stay: Payer: Medicare Other

## 2021-07-11 ENCOUNTER — Inpatient Hospital Stay: Payer: Medicare Other | Admitting: Oncology

## 2021-07-11 ENCOUNTER — Encounter: Payer: Self-pay | Admitting: Oncology

## 2021-07-11 VITALS — BP 111/67 | HR 93 | Temp 97.9°F | Resp 18 | Ht 63.0 in | Wt 146.2 lb

## 2021-07-11 DIAGNOSIS — Z95828 Presence of other vascular implants and grafts: Secondary | ICD-10-CM | POA: Insufficient documentation

## 2021-07-11 DIAGNOSIS — Z79899 Other long term (current) drug therapy: Secondary | ICD-10-CM | POA: Diagnosis not present

## 2021-07-11 DIAGNOSIS — C679 Malignant neoplasm of bladder, unspecified: Secondary | ICD-10-CM

## 2021-07-11 DIAGNOSIS — N1330 Unspecified hydronephrosis: Secondary | ICD-10-CM | POA: Diagnosis not present

## 2021-07-11 DIAGNOSIS — D709 Neutropenia, unspecified: Secondary | ICD-10-CM | POA: Diagnosis not present

## 2021-07-11 DIAGNOSIS — N189 Chronic kidney disease, unspecified: Secondary | ICD-10-CM | POA: Insufficient documentation

## 2021-07-11 DIAGNOSIS — Z5111 Encounter for antineoplastic chemotherapy: Secondary | ICD-10-CM | POA: Diagnosis not present

## 2021-07-11 DIAGNOSIS — Z7982 Long term (current) use of aspirin: Secondary | ICD-10-CM | POA: Insufficient documentation

## 2021-07-11 LAB — CBC WITH DIFFERENTIAL (CANCER CENTER ONLY)
Abs Immature Granulocytes: 0.69 10*3/uL — ABNORMAL HIGH (ref 0.00–0.07)
Basophils Absolute: 0.1 10*3/uL (ref 0.0–0.1)
Basophils Relative: 1 %
Eosinophils Absolute: 0 10*3/uL (ref 0.0–0.5)
Eosinophils Relative: 0 %
HCT: 26.4 % — ABNORMAL LOW (ref 39.0–52.0)
Hemoglobin: 8.3 g/dL — ABNORMAL LOW (ref 13.0–17.0)
Immature Granulocytes: 14 %
Lymphocytes Relative: 32 %
Lymphs Abs: 1.6 10*3/uL (ref 0.7–4.0)
MCH: 26.5 pg (ref 26.0–34.0)
MCHC: 31.4 g/dL (ref 30.0–36.0)
MCV: 84.3 fL (ref 80.0–100.0)
Monocytes Absolute: 1.9 10*3/uL — ABNORMAL HIGH (ref 0.1–1.0)
Monocytes Relative: 38 %
Neutro Abs: 0.7 10*3/uL — ABNORMAL LOW (ref 1.7–7.7)
Neutrophils Relative %: 15 %
Platelet Count: 898 10*3/uL — ABNORMAL HIGH (ref 150–400)
RBC: 3.13 MIL/uL — ABNORMAL LOW (ref 4.22–5.81)
RDW: 16.4 % — ABNORMAL HIGH (ref 11.5–15.5)
Smear Review: NORMAL
WBC Count: 5 10*3/uL (ref 4.0–10.5)
nRBC: 2.4 % — ABNORMAL HIGH (ref 0.0–0.2)

## 2021-07-11 LAB — CULTURE, BLOOD (ROUTINE X 2): Culture: NO GROWTH

## 2021-07-11 LAB — CMP (CANCER CENTER ONLY)
ALT: 100 U/L — ABNORMAL HIGH (ref 0–44)
AST: 58 U/L — ABNORMAL HIGH (ref 15–41)
Albumin: 3 g/dL — ABNORMAL LOW (ref 3.5–5.0)
Alkaline Phosphatase: 88 U/L (ref 38–126)
Anion gap: 6 (ref 5–15)
BUN: 23 mg/dL (ref 8–23)
CO2: 24 mmol/L (ref 22–32)
Calcium: 8.5 mg/dL — ABNORMAL LOW (ref 8.9–10.3)
Chloride: 108 mmol/L (ref 98–111)
Creatinine: 1.79 mg/dL — ABNORMAL HIGH (ref 0.61–1.24)
GFR, Estimated: 40 mL/min — ABNORMAL LOW (ref >60)
Glucose, Bld: 115 mg/dL — ABNORMAL HIGH (ref 70–99)
Potassium: 4.2 mmol/L (ref 3.5–5.1)
Sodium: 138 mmol/L (ref 135–145)
Total Bilirubin: 0.2 mg/dL — ABNORMAL LOW (ref 0.3–1.2)
Total Protein: 6.7 g/dL (ref 6.5–8.1)

## 2021-07-11 MED ORDER — SODIUM CHLORIDE 0.9 % IV SOLN
10.0000 mg | Freq: Once | INTRAVENOUS | Status: AC
  Administered 2021-07-11: 10 mg via INTRAVENOUS
  Filled 2021-07-11: qty 10

## 2021-07-11 MED ORDER — SODIUM CHLORIDE 0.9% FLUSH
10.0000 mL | INTRAVENOUS | Status: DC | PRN
  Administered 2021-07-11: 10 mL

## 2021-07-11 MED ORDER — SODIUM CHLORIDE 0.9 % IV SOLN
150.0000 mg | Freq: Once | INTRAVENOUS | Status: AC
  Administered 2021-07-11: 150 mg via INTRAVENOUS
  Filled 2021-07-11: qty 150

## 2021-07-11 MED ORDER — PALONOSETRON HCL INJECTION 0.25 MG/5ML
0.2500 mg | Freq: Once | INTRAVENOUS | Status: AC
  Administered 2021-07-11: 0.25 mg via INTRAVENOUS
  Filled 2021-07-11: qty 5

## 2021-07-11 MED ORDER — SODIUM CHLORIDE 0.9 % IV SOLN
1000.0000 mg/m2 | Freq: Once | INTRAVENOUS | Status: AC
  Administered 2021-07-11: 1748 mg via INTRAVENOUS
  Filled 2021-07-11: qty 45.97

## 2021-07-11 MED ORDER — HEPARIN SOD (PORK) LOCK FLUSH 100 UNIT/ML IV SOLN
500.0000 [IU] | Freq: Once | INTRAVENOUS | Status: AC | PRN
  Administered 2021-07-11: 500 [IU]

## 2021-07-11 MED ORDER — SODIUM CHLORIDE 0.9 % IV SOLN
304.5000 mg | Freq: Once | INTRAVENOUS | Status: AC
  Administered 2021-07-11: 300 mg via INTRAVENOUS
  Filled 2021-07-11: qty 30

## 2021-07-11 MED ORDER — SODIUM CHLORIDE 0.9 % IV SOLN: Freq: Once | INTRAVENOUS | Status: AC

## 2021-07-11 MED ORDER — SODIUM CHLORIDE 0.9% FLUSH
10.0000 mL | Freq: Once | INTRAVENOUS | Status: AC
  Administered 2021-07-11: 10 mL

## 2021-07-11 NOTE — Progress Notes (Signed)
Hematology and Oncology Follow Up Visit  Samuel [name] [id number] 08/02/1950 72 y.o. 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  He initially presented with noninvasive disease in June 2022.   Prior Therapy:  He is status post BCG treatment and June 2022.  Current therapy: Carboplatin and gemcitabine started on June 20, 2021.  He is here for cycle 2, day 1.  Interim History: Samuel [name] returns today for a follow-up visit.  Since the last visit, he was hospitalized on July 06, 2021 with a urinary tract infection presenting with symptoms of fatigue and fever.  Patient treated with cefepime and subsequently was discharged on Cipro.  He has also had nephrostomy tube replaced at that time.  Since his discharge, he reports feeling reasonably well without any other complaints.  He denies any nausea, vomiting or abdominal pain.  He denies any fevers or chills.  He has tolerated the first cycle of chemotherapy generally well.      Medications: I have reviewed the patient's current medications.  Current Outpatient Medications  Medication Sig Dispense Refill   acetaminophen (TYLENOL) 325 MG tablet Take 325 mg by mouth every 6 (six) hours as needed for moderate pain or fever.     albuterol (PROVENTIL) (2.5 MG/3ML) 0.083% nebulizer solution Take 2.5 mg by nebulization in the morning and at bedtime.     albuterol (VENTOLIN HFA) 108 (90 Base) MCG/ACT inhaler Inhale 2 puffs into the lungs every 6 (six) hours as needed for wheezing or shortness of breath. 8 g 6   amLODipine (NORVASC) 10 MG tablet Take 10 mg by mouth at bedtime.     aspirin EC 81 MG tablet Take 1 tablet (81 mg total) by mouth daily. Swallow whole. 90 tablet 3   atorvastatin (LIPITOR) 40 MG tablet Take 1 tablet (40 mg total) by mouth daily. 90 tablet 3    Budeson-Glycopyrrol-Formoterol (BREZTRI AEROSPHERE) 160-9-4.8 MCG/ACT AERO Inhale 2 puffs into the lungs in the morning and at bedtime. 11.8 g 11   Cholecalciferol (VITAMIN D3 PO) Take 1 tablet by mouth daily.     ciprofloxacin (CIPRO) 500 MG tablet Take 1 tablet (500 mg total) by mouth 2 (two) times daily for 7 days. 14 tablet 0   citalopram (CELEXA) 20 MG tablet Take 1 tablet (20 mg total) by mouth daily. (Patient taking differently: Take 20 mg by mouth at bedtime.) 90 tablet 1   Cyanocobalamin (B-12 PO) Take 1 capsule by mouth daily.     finasteride (PROSCAR) 5 MG tablet Take 5 mg by mouth daily.     lidocaine-prilocaine (EMLA) cream Apply 1 application topically as needed. 30 g 0   metoprolol succinate (TOPROL-XL) 25 MG 24 hr tablet Take 25 mg by mouth daily.     oxyCODONE (OXY IR/ROXICODONE) 5 MG immediate release tablet Take 1 tablet (5 mg total) by mouth every 6 (six) hours as needed for moderate pain or severe pain (Post-operatively). 25 tablet 0   OXYGEN Inhale 2 L into the lungs See admin instructions. 2L when taking a nap and at bedtime     prochlorperazine (COMPAZINE) 10 MG tablet TAKE 1 TABLET(10 MG) BY MOUTH EVERY 6 HOURS AS NEEDED FOR NAUSEA OR VOMITING 30 tablet 0   tamsulosin (FLOMAX) 0.4 MG CAPS capsule Take 2 capsules by mouth at bedtime.     No current facility-administered medications for this visit.  Facility-Administered Medications Ordered in Other Visits  Medication Dose Route Frequency Provider Last Rate Last Admin   sodium chloride flush (NS) 0.9 % injection 10 mL  10 mL Intracatheter Once Wyatt Portela, MD         Allergies:  Allergies  Allergen Reactions   Penicillins Diarrhea and Nausea And Vomiting    Did it involve swelling of the face/tongue/throat, SOB, or low BP? No Did it involve sudden or severe rash/hives, skin peeling, or any reaction on the inside of your mouth or nose?  Did you need to seek medical attention at a hospital or doctor's office?  No When did it last happen?    Several Years Ago   If all above answers are "NO", may proceed with cephalosporin use.        Physical Exam: Blood pressure 111/67, pulse 93, temperature 97.9 F (36.6 C), temperature source Temporal, resp. rate 18, height 5\' 3"  (1.6 m), weight 146 lb 3.2 oz (66.3 kg), SpO2 92 %.  ECOG: 1    General appearance: Comfortable appearing without any discomfort Head: Normocephalic without any trauma Oropharynx: Mucous membranes are moist and pink without any thrush or ulcers. Eyes: Pupils are equal and round reactive to light. Lymph nodes: No cervical, supraclavicular, inguinal or axillary lymphadenopathy.   Heart:regular rate and rhythm.  S1 and S2 without leg edema. Lung: Clear without any rhonchi or wheezes.  No dullness to percussion. Abdomin: Soft, nontender, nondistended with good bowel sounds.  No hepatosplenomegaly. Musculoskeletal: No joint deformity or effusion.  Full range of motion noted. Neurological: No deficits noted on motor, sensory and deep tendon reflex exam. Skin: No petechial rash or dryness.  Appeared moist.      Lab Results: Lab Results  Component Value Date   WBC 2.0 (L) 07/09/2021   HGB 7.8 (L) 07/09/2021   HCT 24.6 (L) 07/09/2021   MCV 85.4 07/09/2021   PLT 580 (H) 07/09/2021     Chemistry      Component Value Date/Time   NA 136 07/09/2021 0350   K 3.9 07/09/2021 0350   CL 112 (H) 07/09/2021 0350   CO2 22 07/09/2021 0350   BUN 28 (H) 07/09/2021 0350   CREATININE 1.77 (H) 07/09/2021 0350   CREATININE 1.91 (H) 06/27/2021 1231   CREATININE 2.14 (H) 04/04/2020 0748      Component Value Date/Time   CALCIUM 7.8 (L) 07/09/2021 0350   ALKPHOS 56 07/09/2021 0350   AST 51 (H) 07/09/2021 0350   AST 35 06/27/2021 1231   ALT 117 (H) 07/09/2021 0350   ALT 30 06/27/2021 1231   BILITOT 0.1 (L) 07/09/2021 0350   BILITOT 0.3 06/27/2021 1231       Radiological Studies: IR NEPHROSTOMY EXCHANGE LEFT  Result Date:  07/09/2021 INDICATION: 71 year old male with history of urinary tract infection, currently admitted, with Pseudomonas growing from indwelling right nephrostomy tube. EXAM: FLUOROSCOPIC GUIDED BILATERAL SIDED NEPHROSTOMY CATHETER EXCHANGE COMPARISON:  07/09/2021, 06/18/2021 CONTRAST:  A total of 15 mL Isovue-300 administered was administered into both collecting systems FLUOROSCOPY TIME:  One minute 48 seconds, 6 mGy COMPLICATIONS: None immediate. TECHNIQUE: Informed written consent was obtained from the patient after a discussion of the risks, benefits and alternatives to treatment. Questions regarding the procedure were encouraged and answered. A timeout was performed prior to the initiation of the procedure. The bilateral flanks and external portions of existing nephrostomy catheters were prepped and draped in the usual sterile fashion. A sterile drape was applied covering the operative field.  Maximum barrier sterile technique with sterile gowns and gloves were used for the procedure. A timeout was performed prior to the initiation of the procedure. A pre procedural spot fluoroscopic image was obtained. Beginning with the left-sided nephrostomy, a small amount of contrast was injected via the existing left-sided nephrostomy catheter demonstrating appropriate positioning within the renal pelvis. The existing nephrostomy catheter was cut and cannulated with a Benson wire which was coiled within the renal pelvis. Under intermittent fluoroscopic guidance, the existing nephrostomy catheter was exchanged for a new 10.2 [place] all-purpose drainage catheter. Limited contrast injection confirmed appropriate positioning within the left renal pelvis and a post exchange fluoroscopic image was obtained. The catheter was locked, secured to the skin with an interrupted suture and reconnected to a gravity bag. The identical repeat procedure was repeated for the contralateral right-sided nephrostomy, ultimately allowing successful  exchange of a new 10.2 [place] all-purpose drainage catheter with end coiled and locked within the right renal pelvis. Dressings were placed. The patient tolerated the above procedures well without immediate postprocedural complication. FINDINGS: The existing nephrostomy catheters are appropriately positioned and functioning. After successful fluoroscopic guided exchange, new bilateral 10.2French nephrostomy catheters are coiled and locked within the respective renal pelvises. IMPRESSION: Successful fluoroscopic guided exchange of bilateral 10.2 French percutaneous nephrostomy catheters. Ruthann Cancer, MD [facility] Radiology Electronically Signed   By: Ruthann Cancer M.D.   On: 07/09/2021 16:04   IR NEPHROSTOMY EXCHANGE RIGHT  Result Date: 07/09/2021 INDICATION: 71 year old male with history of urinary tract infection, currently admitted, with Pseudomonas growing from indwelling right nephrostomy tube. EXAM: FLUOROSCOPIC GUIDED BILATERAL SIDED NEPHROSTOMY CATHETER EXCHANGE COMPARISON:  07/09/2021, 06/18/2021 CONTRAST:  A total of 15 mL Isovue-300 administered was administered into both collecting systems FLUOROSCOPY TIME:  One minute 48 seconds, 6 mGy COMPLICATIONS: None immediate. TECHNIQUE: Informed written consent was obtained from the patient after a discussion of the risks, benefits and alternatives to treatment. Questions regarding the procedure were encouraged and answered. A timeout was performed prior to the initiation of the procedure. The bilateral flanks and external portions of existing nephrostomy catheters were prepped and draped in the usual sterile fashion. A sterile drape was applied covering the operative field. Maximum barrier sterile technique with sterile gowns and gloves were used for the procedure. A timeout was performed prior to the initiation of the procedure. A pre procedural spot fluoroscopic image was obtained. Beginning with the  left-sided nephrostomy, a small amount of contrast was injected via the existing left-sided nephrostomy catheter demonstrating appropriate positioning within the renal pelvis. The existing nephrostomy catheter was cut and cannulated with a Benson wire which was coiled within the renal pelvis. Under intermittent fluoroscopic guidance, the existing nephrostomy catheter was exchanged for a new 10.2 [place] all-purpose drainage catheter. Limited contrast injection confirmed appropriate positioning within the left renal pelvis and a post exchange fluoroscopic image was obtained. The catheter was locked, secured to the skin with an interrupted suture and reconnected to a gravity bag. The identical repeat procedure was repeated for the contralateral right-sided nephrostomy, ultimately allowing successful exchange of a new 10.2 [place] all-purpose drainage catheter with end coiled and locked within the right renal pelvis. Dressings were placed. The patient tolerated the above procedures well without immediate postprocedural complication. FINDINGS: The existing nephrostomy catheters are appropriately positioned and functioning. After successful fluoroscopic guided exchange, new bilateral 10.2French nephrostomy catheters are coiled and locked within the respective renal pelvises. IMPRESSION: Successful fluoroscopic guided exchange of bilateral 10.2 French percutaneous nephrostomy  catheters. Ruthann Cancer, MD [facility] Radiology Electronically Signed   By: Ruthann Cancer M.D.   On: 07/09/2021 16:04      IMPRESSION: 1. Large hypermetabolic bladder mass associated with pelvic and upper abdominal hypermetabolic adenopathy compatible with metastatic disease. Adenopathy has worsened compared to 03/30/2021. 2. Small sclerotic lesion posteriorly in the T12 vertebral body has associated accentuated metabolic activity, bony oligometastatic disease not excluded. 3. Right lateral  fourth rib focal activity without underlying CT abnormality, significance uncertain. 4. Oval-shaped mass along the right sciatic nerve has mildly accentuated metabolic activity with maximum SUV 3.7. 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 5. Other imaging findings of potential clinical significance: Aortic Atherosclerosis (ICD10-I70.0) and Emphysema (ICD10-J43.9). 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 Small type 1 hiatal hernia. Coronary atherosclerosis. Bilateral nephrostomies. Left gluteus medius lipoma. Chronic cutaneous ulceration along the left lower back.     Impression and Plan:   71 year old with:   1.  Stage IV high-grade urothelial carcinoma and small cell features of the bladder with pelvic adenopathy diagnosed in December 2022.   PET scan obtained on June 14, 2021 was personally reviewed and discussed today with the patient.  He had a large bladder mass associated with pelvic adenopathy and questionable T12 lesion but no other areas of metastasis.  He completed the first cycle of chemotherapy without any major complications.  Risks and benefits of proceeding with this therapy were discussed.  Alternative therapy would be in the form checkpoint inhibitors single agent or combination with Padcev.  After discussion today he is agreeable to proceed at this time and we will update his staging scan after third cycle.          2.  IV access: Port-A-Cath inserted without any complications.   3.  Antiemetics: Compazine is available to her without any nausea or vomiting.   4.  Chronic Kidney disease: Related to hydronephrosis with creatinine clearance remains stable at 40 cc/min.  Nephrostomy tube remains in place.   5.  Goals of care: His disease is incurable although aggressive measures are warranted.   6.  Follow-up: He will return in 1 week to complete cycle 2 and in 3 weeks for the start of  cycle 3.   30  minutes were spent on this encounter.  The time was dedicated to reviewing laboratory data, disease status update and outlining future plan of care discussion.     Zola Button, MD 3/1/20238:38 AM

## 2021-07-11 NOTE — Progress Notes (Signed)
Pt is approved for the $1000 [employer] grant.  

## 2021-07-11 NOTE — Patient Instructions (Signed)
[facility]  Discharge Instructions: ?Thank you for choosing [facility] to provide your oncology and hematology care.  ? ?If you have a lab appointment with the [facility], please go directly to the [facility] and check in at the registration area. ?  ?Wear comfortable clothing and clothing appropriate for easy access to any Portacath or PICC line.  ? ?We strive to give you quality time with your provider. You may need to reschedule your appointment if you arrive late (15 or more minutes).  Arriving late affects you and other patients whose appointments are after yours.  Also, if you miss three or more appointments without notifying the office, you may be dismissed from the clinic at the provider?s discretion.    ?  ?For prescription refill requests, have your pharmacy contact our office and allow 72 hours for refills to be completed.   ? ?Today you received the following chemotherapy and/or immunotherapy agents gemcitabine, carboplatin    ?  ?To help prevent nausea and vomiting after your treatment, we encourage you to take your nausea medication as directed. ? ?BELOW ARE SYMPTOMS THAT SHOULD BE REPORTED IMMEDIATELY: ?*FEVER GREATER THAN 100.4 F (38 ?C) OR HIGHER ?*CHILLS OR SWEATING ?*NAUSEA AND VOMITING THAT IS NOT CONTROLLED WITH YOUR NAUSEA MEDICATION ?*UNUSUAL SHORTNESS OF BREATH ?*UNUSUAL BRUISING OR BLEEDING ?*URINARY PROBLEMS (pain or burning when urinating, or frequent urination) ?*BOWEL PROBLEMS (unusual diarrhea, constipation, pain near the anus) ?TENDERNESS IN MOUTH AND THROAT WITH OR WITHOUT PRESENCE OF ULCERS (sore throat, sores in mouth, or a toothache) ?UNUSUAL RASH, SWELLING OR PAIN  ?UNUSUAL VAGINAL DISCHARGE OR ITCHING  ? ?Items with * indicate a potential emergency and should be followed up as soon as possible or go to the Emergency Department if any problems should occur. ? ?Please show the CHEMOTHERAPY ALERT CARD or IMMUNOTHERAPY ALERT CARD  at check-in to the Emergency Department and triage nurse. ? ?Should you have questions after your visit or need to cancel or reschedule your appointment, please contact [facility]  Dept: [phone]  and follow the prompts.  Office hours are 8:00 a.m. to 4:30 p.m. Monday - Friday. Please note that voicemails left after 4:00 p.m. may not be returned until the following business day.  We are closed weekends and major holidays. You have access to a nurse at all times for urgent questions. Please call the main number to the clinic Dept: [phone] and follow the prompts. ? ? ?For any non-urgent questions, you may also contact your provider using MyChart. We now offer e-Visits for anyone 79 and older to request care online for non-urgent symptoms. For details visit [email or web address]. ?  ?Also download the MyChart app! Go to the app store, search "MyChart", open the app, select [facility], and log in with your MyChart username and password. ? ?Due to Covid, a mask is required upon entering the hospital/clinic. If you do not have a mask, one will be given to you upon arrival. For doctor visits, patients may have 1 support person aged 9 or older with them. For treatment visits, patients cannot have anyone with them due to current Covid guidelines and our immunocompromised population.  ? ?

## 2021-07-11 NOTE — Progress Notes (Signed)
Per Dr. Alen Blew ok to treat with ANC 0.7K/uL; Creatinine 1.79mg /dL; ALT 100U/L ?

## 2021-07-12 DIAGNOSIS — J9611 Chronic respiratory failure with hypoxia: Secondary | ICD-10-CM | POA: Diagnosis not present

## 2021-07-12 LAB — CULTURE, BLOOD (ROUTINE X 2)
Culture: NO GROWTH
Special Requests: ADEQUATE

## 2021-07-16 DIAGNOSIS — N1832 Chronic kidney disease, stage 3b: Secondary | ICD-10-CM | POA: Diagnosis not present

## 2021-07-17 DIAGNOSIS — N184 Chronic kidney disease, stage 4 (severe): Secondary | ICD-10-CM | POA: Diagnosis not present

## 2021-07-17 DIAGNOSIS — C678 Malignant neoplasm of overlapping sites of bladder: Secondary | ICD-10-CM | POA: Diagnosis not present

## 2021-07-17 DIAGNOSIS — N130 Hydronephrosis with ureteropelvic junction obstruction: Secondary | ICD-10-CM | POA: Diagnosis not present

## 2021-07-17 DIAGNOSIS — C775 Secondary and unspecified malignant neoplasm of intrapelvic lymph nodes: Secondary | ICD-10-CM | POA: Diagnosis not present

## 2021-07-18 ENCOUNTER — Other Ambulatory Visit: Payer: Self-pay

## 2021-07-18 ENCOUNTER — Inpatient Hospital Stay: Payer: Medicare Other | Admitting: Dietician

## 2021-07-18 ENCOUNTER — Inpatient Hospital Stay: Payer: Medicare Other

## 2021-07-18 VITALS — BP 111/66 | HR 92 | Temp 98.2°F | Resp 18 | Wt 145.8 lb

## 2021-07-18 DIAGNOSIS — Z95828 Presence of other vascular implants and grafts: Secondary | ICD-10-CM

## 2021-07-18 DIAGNOSIS — N189 Chronic kidney disease, unspecified: Secondary | ICD-10-CM | POA: Diagnosis not present

## 2021-07-18 DIAGNOSIS — Z5111 Encounter for antineoplastic chemotherapy: Secondary | ICD-10-CM | POA: Diagnosis not present

## 2021-07-18 DIAGNOSIS — C679 Malignant neoplasm of bladder, unspecified: Secondary | ICD-10-CM

## 2021-07-18 DIAGNOSIS — Z7982 Long term (current) use of aspirin: Secondary | ICD-10-CM | POA: Diagnosis not present

## 2021-07-18 DIAGNOSIS — Z79899 Other long term (current) drug therapy: Secondary | ICD-10-CM | POA: Diagnosis not present

## 2021-07-18 DIAGNOSIS — N1330 Unspecified hydronephrosis: Secondary | ICD-10-CM | POA: Diagnosis not present

## 2021-07-18 DIAGNOSIS — D709 Neutropenia, unspecified: Secondary | ICD-10-CM | POA: Diagnosis not present

## 2021-07-18 LAB — CBC WITH DIFFERENTIAL (CANCER CENTER ONLY)
Abs Immature Granulocytes: 0.13 10*3/uL — ABNORMAL HIGH (ref 0.00–0.07)
Basophils Absolute: 0.1 10*3/uL (ref 0.0–0.1)
Basophils Relative: 1 %
Eosinophils Absolute: 0 10*3/uL (ref 0.0–0.5)
Eosinophils Relative: 0 %
HCT: 24 % — ABNORMAL LOW (ref 39.0–52.0)
Hemoglobin: 7.8 g/dL — ABNORMAL LOW (ref 13.0–17.0)
Immature Granulocytes: 2 %
Lymphocytes Relative: 15 %
Lymphs Abs: 0.9 10*3/uL (ref 0.7–4.0)
MCH: 27.3 pg (ref 26.0–34.0)
MCHC: 32.5 g/dL (ref 30.0–36.0)
MCV: 83.9 fL (ref 80.0–100.0)
Monocytes Absolute: 0.5 10*3/uL (ref 0.1–1.0)
Monocytes Relative: 9 %
Neutro Abs: 4.2 10*3/uL (ref 1.7–7.7)
Neutrophils Relative %: 73 %
Platelet Count: 559 10*3/uL — ABNORMAL HIGH (ref 150–400)
RBC: 2.86 MIL/uL — ABNORMAL LOW (ref 4.22–5.81)
RDW: 16.5 % — ABNORMAL HIGH (ref 11.5–15.5)
WBC Count: 5.8 10*3/uL (ref 4.0–10.5)
nRBC: 0 % (ref 0.0–0.2)

## 2021-07-18 LAB — CMP (CANCER CENTER ONLY)
ALT: 85 U/L — ABNORMAL HIGH (ref 0–44)
AST: 52 U/L — ABNORMAL HIGH (ref 15–41)
Albumin: 3.2 g/dL — ABNORMAL LOW (ref 3.5–5.0)
Alkaline Phosphatase: 95 U/L (ref 38–126)
Anion gap: 8 (ref 5–15)
BUN: 35 mg/dL — ABNORMAL HIGH (ref 8–23)
CO2: 26 mmol/L (ref 22–32)
Calcium: 8.6 mg/dL — ABNORMAL LOW (ref 8.9–10.3)
Chloride: 107 mmol/L (ref 98–111)
Creatinine: 1.65 mg/dL — ABNORMAL HIGH (ref 0.61–1.24)
GFR, Estimated: 44 mL/min — ABNORMAL LOW (ref >60)
Glucose, Bld: 96 mg/dL (ref 70–99)
Potassium: 4.4 mmol/L (ref 3.5–5.1)
Sodium: 141 mmol/L (ref 135–145)
Total Bilirubin: 0.2 mg/dL — ABNORMAL LOW (ref 0.3–1.2)
Total Protein: 6.6 g/dL (ref 6.5–8.1)

## 2021-07-18 MED ORDER — SODIUM CHLORIDE 0.9 % IV SOLN
1000.0000 mg/m2 | Freq: Once | INTRAVENOUS | Status: AC
  Administered 2021-07-18: 1748 mg via INTRAVENOUS
  Filled 2021-07-18: qty 45.97

## 2021-07-18 MED ORDER — SODIUM CHLORIDE 0.9% FLUSH
10.0000 mL | Freq: Once | INTRAVENOUS | Status: AC
  Administered 2021-07-18: 10 mL

## 2021-07-18 MED ORDER — SODIUM CHLORIDE 0.9 % IV SOLN: Freq: Once | INTRAVENOUS | Status: AC

## 2021-07-18 MED ORDER — SODIUM CHLORIDE 0.9% FLUSH
10.0000 mL | INTRAVENOUS | Status: DC | PRN
  Administered 2021-07-18: 10 mL

## 2021-07-18 MED ORDER — PROCHLORPERAZINE MALEATE 10 MG PO TABS
10.0000 mg | ORAL_TABLET | Freq: Once | ORAL | Status: AC
  Administered 2021-07-18: 10 mg via ORAL
  Filled 2021-07-18: qty 1

## 2021-07-18 MED ORDER — HEPARIN SOD (PORK) LOCK FLUSH 100 UNIT/ML IV SOLN
500.0000 [IU] | Freq: Once | INTRAVENOUS | Status: AC | PRN
  Administered 2021-07-18: 500 [IU]

## 2021-07-18 NOTE — Progress Notes (Signed)
Nutrition Assessment ? ? ?Reason for Assessment: MST (+ wt loss) ? ? ?ASSESSMENT: 71 year old male with stage IV bladder cancer. S/p BCG treatment (June 2022). Patient is currently receiving carboplatin/gemcitabine q21d. Patient is under the care of Dr. Alen Blew. ? ?Past medical history includes COPD, centrilobular emphysema, supplemental 2L O2 at night, HTN, CKD stage III, BPH without urinary obstruction.   ? ?2/24-2/27 hospital admission for neutropenia  ? ?Met with patient during infusion. Patient reports decreased appetite. He is eating 2 meals/day as usual, but eating less. Patient recalls 2 scrambled eggs, burnt bacon, pancakes, french toast for breakfast. He does not eat again until dinner time. Last night he had pork chops, mashed potatoes, peas. Patient wears supplemental oxygen at night. Sometimes he feels tired and short of breath with eating. Patient drinks Boost Original on occasion. He does not drink much water. Patient reports he is constipated, states given a paper from Dr. Hazeline Junker nurse with medications he could take.   ? ?Nutrition Focused Physical Exam: deferred  ? ? ?Medications: oxycodone, B12, lipitor, celexa, D3 ? ? ?Labs: BUN 35, Cr 1.65 ? ? ?Anthropometrics:  ? ?Height: 5'3" ?Weight: 145 lb 12.8 oz  ?UBW: 153 lb (11/08/20) ?BMI: 25.83 ? ? ?NUTRITION DIAGNOSIS: Food and nutrition related knowledge deficit related to cancer and associated treatments as evidenced by no prior need for related nutrition information ? ? ?INTERVENTION:  ?Encouraged small frequent meals/snacks and educated on soft moist high protein foods for ease of intake ?Continue drinking 2 Boost/day and suggested switching to Boost Plus for added calories and protein - coupons provided ?Contact information provided  ? ?MONITORING, EVALUATION, GOAL: Patient will tolerate increased calories and protein to minimize weight loss ? ? ?Next Visit: Wednesday April 19 during infusion  ? ? ? ? ? ? ?

## 2021-07-18 NOTE — Progress Notes (Signed)
Pt's wife is concerned that this pt has not had a bowel movement in 3 days after taking dulcolax. Abdomen is not distended and he has no complaints of pain. Porshe C. LPN gave this LPN some suggestions like milk of mag, warm prune juice or hot liquids, senekot S-take 2 of those daily, increase fluids, eat high in fiber. If all fails, they may want to try an Enema. Wife and pt verbalizes understanding and know to call the office with anymore concerns.  ?

## 2021-07-18 NOTE — Patient Instructions (Signed)
[facility] MEDICAL ONCOLOGY   Discharge Instructions: Thank you for choosing [facility] to provide your oncology and hematology care.   If you have a lab appointment with the Cancer Center, please go directly to the Cancer Center and check in at the registration area.   Wear comfortable clothing and clothing appropriate for easy access to any Portacath or PICC line.   We strive to give you quality time with your provider. You may need to reschedule your appointment if you arrive late (15 or more minutes).  Arriving late affects you and other patients whose appointments are after yours.  Also, if you miss three or more appointments without notifying the office, you may be dismissed from the clinic at the provider's discretion.      For prescription refill requests, have your pharmacy contact our office and allow 72 hours for refills to be completed.    Today you received the following chemotherapy and/or immunotherapy agents: Gemcitabine (Gemzar)      To help prevent nausea and vomiting after your treatment, we encourage you to take your nausea medication as directed.  BELOW ARE SYMPTOMS THAT SHOULD BE REPORTED IMMEDIATELY: *FEVER GREATER THAN 100.4 F (38 C) OR HIGHER *CHILLS OR SWEATING *NAUSEA AND VOMITING THAT IS NOT CONTROLLED WITH YOUR NAUSEA MEDICATION *UNUSUAL SHORTNESS OF BREATH *UNUSUAL BRUISING OR BLEEDING *URINARY PROBLEMS (pain or burning when urinating, or frequent urination) *BOWEL PROBLEMS (unusual diarrhea, constipation, pain near the anus) TENDERNESS IN MOUTH AND THROAT WITH OR WITHOUT PRESENCE OF ULCERS (sore throat, sores in mouth, or a toothache) UNUSUAL RASH, SWELLING OR PAIN  UNUSUAL VAGINAL DISCHARGE OR ITCHING   Items with * indicate a potential emergency and should be followed up as soon as possible or go to the Emergency Department if any problems should occur.  Please show the CHEMOTHERAPY ALERT CARD or IMMUNOTHERAPY ALERT CARD at  check-in to the Emergency Department and triage nurse.  Should you have questions after your visit or need to cancel or reschedule your appointment, please contact [facility] ONCOLOGY  Dept: [phone]  and follow the prompts.  Office hours are 8:00 a.m. to 4:30 p.m. Monday - Friday. Please note that voicemails left after 4:00 p.m. may not be returned until the following business day.  We are closed weekends and major holidays. You have access to a nurse at all times for urgent questions. Please call the main number to the clinic Dept: [phone] and follow the prompts.   For any non-urgent questions, you may also contact your provider using MyChart. We now offer e-Visits for anyone 18 and older to request care online for non-urgent symptoms. For details visit [email or web address].   Also download the MyChart app! Go to the app store, search "MyChart", open the app, select [facility], and log in with your MyChart username and password.  Due to Covid, a mask is required upon entering the hospital/clinic. If you do not have a mask, one will be given to you upon arrival. For doctor visits, patients may have 1 support person aged 18 or older with them. For treatment visits, patients cannot have anyone with them due to current Covid guidelines and our immunocompromised population.   

## 2021-07-18 NOTE — Progress Notes (Signed)
Per Dr. Alen Blew - okay to treat with elevated creatinine level of 1.65, ALT of 85, and Hgb of 7.8. ?

## 2021-07-19 ENCOUNTER — Telehealth: Payer: Self-pay | Admitting: Oncology

## 2021-07-19 ENCOUNTER — Ambulatory Visit (INDEPENDENT_AMBULATORY_CARE_PROVIDER_SITE_OTHER): Payer: Medicare Other | Admitting: Adult Health

## 2021-07-19 ENCOUNTER — Encounter: Payer: Self-pay | Admitting: Adult Health

## 2021-07-19 VITALS — BP 110/60 | HR 84 | Temp 98.1°F | Ht 63.0 in | Wt 147.0 lb

## 2021-07-19 DIAGNOSIS — R5383 Other fatigue: Secondary | ICD-10-CM

## 2021-07-19 DIAGNOSIS — K5909 Other constipation: Secondary | ICD-10-CM

## 2021-07-19 NOTE — Telephone Encounter (Signed)
Scheduled per 03/01 los, patient has been called and notified. ?

## 2021-07-19 NOTE — Progress Notes (Signed)
Subjective:    Patient ID: Samuel [name], male    DOB: 1950-09-13, 71 y.o.   MRN: [id number]  HPI  71 year old male who  has a past medical history of Anemia, Anxiety, Arthritis, Bladder cancer (HCC), BPH with obstruction/lower urinary tract symptoms, CKD (chronic kidney disease), stage III (HCC), Congenital deformity of left hand, COPD (chronic obstructive pulmonary disease) (HCC), Depression, Dyspnea, Essential hypertension, History of adenomatous polyp of colon, Hyperlipidemia, Renal cysts, acquired, bilateral, and Wears glasses.  He presents to the office today for follow-up visit.  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  Biggest complaint is that of chronic fatigue.  He was hospitalized on July 06, 2021 with a urinary tract infection that presented with symptoms of fatigue and fever.  He was treated with cefepime and subsequently discharged on Cipro.  At this time he also had his urostomy tubes replaced.  Since being discharged he feels back to baseline, has having less urinates.  He has not had any fevers, chills, nausea, vomiting, or abdominal pain.  He has been constipated for the last 3 to 4 days.  Chemotherapy schedule is 2 weeks on and 2 weeks off, likely go until end of April into May.  There is a possibility of starting immunotherapy after chemo.  Unfortunately for this wonderful gentleman's cancer is not curative.  Likely has 12 months or slightly longer.  Review of Systems See HPI   Past Medical History:  Diagnosis Date   Anemia    Anxiety    Arthritis    Bladder cancer [facility])    urologist--- dr Berneice Heinrich   BPH with obstruction/lower urinary tract symptoms    CKD (chronic kidney disease), stage III (HCC)    Congenital deformity of left hand    COPD (chronic obstructive pulmonary disease) (HCC)    followed by pcp   Depression    Dyspnea    Essential hypertension    followed by pcp    History of adenomatous polyp of colon    Hyperlipidemia    Renal cysts, acquired, bilateral    Wears glasses     Social History   Socioeconomic History   Marital status: Married    Spouse name: Not on file   Number of children: Not on file   Years of education: Not on file   Highest education level: 12th grade  Occupational History   Occupation: retired  Tobacco Use   Smoking status: Former    Years: 58.00    Types: Cigarettes    Quit date: 01/16/2017    Years since quitting: 4.5   Smokeless tobacco: Never  Vaping Use   Vaping Use: Never used  Substance and Sexual Activity   Alcohol use: No   Drug use: Never   Sexual activity: Not on file  Other Topics Concern   Not on file  Social History Narrative   Retired    Married       He watches television   Social Determinants of Corporate investment banker Strain: Low Risk    Difficulty of Paying Living Expenses: Not very hard  Food Insecurity: Geophysicist/field seismologist Present   Worried About Programme researcher, broadcasting/film/video in the Last Year: Sometimes true   Barista in the Last Year: Never true  Transportation Needs: No Transportation Needs   Lack of Transportation (Medical): No   Lack of Transportation (Non-Medical): No  Physical Activity: Inactive  Days of Exercise per Week: 0 days   Minutes of Exercise per Session: 0 min  Stress: No Stress Concern Present   Feeling of Stress : Only a little  Social Connections: Moderately Isolated   Frequency of Communication with Friends and Family: Once a week   Frequency of Social Gatherings with Friends and Family: More than three times a week   Attends Religious Services: Never   Database administrator or Organizations: No   Attends Banker Meetings: Never   Marital Status: Married  Catering manager Violence: Not At Risk   Fear of Current or Ex-Partner: No   Emotionally Abused: No   Physically Abused: No   Sexually Abused: No    Past Surgical History:  Procedure  Laterality Date   COLONOSCOPY WITH PROPOFOL  last one 02-04-2017  dr Marina Goodell   CYSTOSCOPY W/ RETROGRADES Bilateral 09/15/2020   Procedure: CYSTOSCOPY WITH RETROGRADE PYELOGRAM;  Surgeon: Sebastian Ache, MD;  [facility];  Service: Urology;  Laterality: Bilateral;   CYSTOSCOPY W/ RETROGRADES Bilateral 05/09/2021   Procedure: CYSTOSCOPY;  Surgeon: Sebastian Ache, MD;  Location: [facility];  Service: Urology;  Laterality: Bilateral;   IR IMAGING GUIDED PORT INSERTION  06/18/2021   IR NEPHROSTOMY EXCHANGE LEFT  06/18/2021   IR NEPHROSTOMY EXCHANGE LEFT  07/09/2021   IR NEPHROSTOMY EXCHANGE RIGHT  06/18/2021   IR NEPHROSTOMY EXCHANGE RIGHT  07/09/2021   IR NEPHROSTOMY PLACEMENT LEFT  03/30/2021   IR NEPHROSTOMY PLACEMENT RIGHT  04/02/2021   TRANSURETHRAL RESECTION OF BLADDER TUMOR N/A 09/15/2020   Procedure: BLADDER BIOPSY FULGERATION;  Surgeon: Sebastian Ache, MD;  Location[facility];  Service: Urology;  Laterality: N/A;  45 MINS   TRANSURETHRAL RESECTION OF BLADDER TUMOR N/A 05/09/2021   Procedure: TRANSURETHRAL RESECTION OF BLADDER TUMOR (TURBT);  Surgeon: Sebastian Ache, MD;  Location: [facility];  Service: Urology;  Laterality: N/A;  39 MINS    Family History  Problem Relation Age of Onset   Hypertension Mother    Stroke Mother    Heart disease Father    Breast cancer Sister    Prostate cancer Brother    Hypertension Sister    Colon cancer Neg Hx    Esophageal cancer Neg Hx    Pancreatic cancer Neg Hx    Rectal cancer Neg Hx    Stomach cancer Neg Hx     Allergies  Allergen Reactions   Penicillins Diarrhea and Nausea And Vomiting    Did it involve swelling of the face/tongue/throat, SOB, or low BP? No Did it involve sudden or severe rash/hives, skin peeling, or any reaction on the inside of your mouth or nose?  Did you need to seek medical attention at a hospital or doctor's office? No When did it last happen?    Several Years Ago   If all above  answers are "NO", may proceed with cephalosporin use.      Current Outpatient Medications on File Prior to Visit  Medication Sig Dispense Refill   acetaminophen (TYLENOL) 325 MG tablet Take 325 mg by mouth every 6 (six) hours as needed for moderate pain or fever.     albuterol (PROVENTIL) (2.5 MG/3ML) 0.083% nebulizer solution Take 2.5 mg by nebulization in the morning and at bedtime.     albuterol (VENTOLIN HFA) 108 (90 Base) MCG/ACT inhaler Inhale 2 puffs into the lungs every 6 (six) hours as needed for wheezing or shortness of breath. 8 g 6   amLODipine (NORVASC) 10  MG tablet Take 10 mg by mouth at bedtime.     aspirin EC 81 MG tablet Take 1 tablet (81 mg total) by mouth daily. Swallow whole. 90 tablet 3   atorvastatin (LIPITOR) 40 MG tablet Take 1 tablet (40 mg total) by mouth daily. 90 tablet 3   Budeson-Glycopyrrol-Formoterol (BREZTRI AEROSPHERE) 160-9-4.8 MCG/ACT AERO Inhale 2 puffs into the lungs in the morning and at bedtime. 11.8 g 11   Cholecalciferol (VITAMIN D3 PO) Take 1 tablet by mouth daily.     citalopram (CELEXA) 20 MG tablet Take 1 tablet (20 mg total) by mouth daily. (Patient taking differently: Take 20 mg by mouth at bedtime.) 90 tablet 1   Cyanocobalamin (B-12 PO) Take 1 capsule by mouth daily.     finasteride (PROSCAR) 5 MG tablet Take 5 mg by mouth daily.     lidocaine-prilocaine (EMLA) cream Apply 1 application topically as needed. 30 g 0   metoprolol succinate (TOPROL-XL) 25 MG 24 hr tablet Take 25 mg by mouth daily.     oxyCODONE (OXY IR/ROXICODONE) 5 MG immediate release tablet Take 1 tablet (5 mg total) by mouth every 6 (six) hours as needed for moderate pain or severe pain (Post-operatively). 25 tablet 0   OXYGEN Inhale 2 L into the lungs See admin instructions. 2L when taking a nap and at bedtime     prochlorperazine (COMPAZINE) 10 MG tablet TAKE 1 TABLET(10 MG) BY MOUTH EVERY 6 HOURS AS NEEDED FOR NAUSEA OR VOMITING 30 tablet 0   tamsulosin (FLOMAX) 0.4 MG CAPS  capsule Take 2 capsules by mouth at bedtime.     No current facility-administered medications on file prior to visit.    BP 110/60   Pulse 84   Temp 98.1 F (36.7 C) (Oral)   Ht 5\' 3"  (1.6 m)   Wt 147 lb (66.7 kg)   SpO2 92%   BMI 26.04 kg/m       Objective:   Physical Exam Vitals and nursing note reviewed.  Constitutional:      Appearance: Normal appearance.  Abdominal:     General: Abdomen is flat. Bowel sounds are normal. There is distension.     Palpations: Abdomen is soft.  Skin:    General: Skin is warm and dry.  Neurological:     General: No focal deficit present.     Mental Status: He is alert and oriented to person, place, and time.  Psychiatric:        Mood and Affect: Mood normal.        Behavior: Behavior normal.        Thought Content: Thought content normal.      Assessment & Plan:  1. Other fatigue -In the setting of chemotherapy/chronic anemia/leukopenia.  Encouraged to try and get outside and walk on nice days and eat healthy  2. Other constipation -Is currently taking Senokot.  Encouraged warm prune juice to stimulate the bowels.  Once he has a bowel movement then start using fiber supplement such as Metamucil or Benefiber.  If no bowel movement in the next day or 2 then okay to use an enema  Shirline Frees, NP

## 2021-07-20 ENCOUNTER — Encounter: Payer: Self-pay | Admitting: Adult Health

## 2021-07-20 NOTE — Telephone Encounter (Signed)
Please advise 

## 2021-07-23 DIAGNOSIS — D631 Anemia in chronic kidney disease: Secondary | ICD-10-CM | POA: Diagnosis not present

## 2021-07-23 DIAGNOSIS — C679 Malignant neoplasm of bladder, unspecified: Secondary | ICD-10-CM | POA: Diagnosis not present

## 2021-07-23 DIAGNOSIS — E875 Hyperkalemia: Secondary | ICD-10-CM | POA: Diagnosis not present

## 2021-07-23 DIAGNOSIS — I129 Hypertensive chronic kidney disease with stage 1 through stage 4 chronic kidney disease, or unspecified chronic kidney disease: Secondary | ICD-10-CM | POA: Diagnosis not present

## 2021-07-23 DIAGNOSIS — N2581 Secondary hyperparathyroidism of renal origin: Secondary | ICD-10-CM | POA: Diagnosis not present

## 2021-07-23 DIAGNOSIS — N1832 Chronic kidney disease, stage 3b: Secondary | ICD-10-CM | POA: Diagnosis not present

## 2021-07-23 DIAGNOSIS — J449 Chronic obstructive pulmonary disease, unspecified: Secondary | ICD-10-CM | POA: Diagnosis not present

## 2021-07-31 ENCOUNTER — Encounter: Payer: Self-pay | Admitting: Pulmonary Disease

## 2021-07-31 ENCOUNTER — Other Ambulatory Visit: Payer: Self-pay

## 2021-07-31 ENCOUNTER — Ambulatory Visit: Payer: Medicare Other | Admitting: Pulmonary Disease

## 2021-07-31 VITALS — BP 116/62 | HR 69 | Ht 63.0 in | Wt 142.4 lb

## 2021-07-31 DIAGNOSIS — J432 Centrilobular emphysema: Secondary | ICD-10-CM

## 2021-07-31 DIAGNOSIS — G4736 Sleep related hypoventilation in conditions classified elsewhere: Secondary | ICD-10-CM | POA: Diagnosis not present

## 2021-07-31 DIAGNOSIS — J439 Emphysema, unspecified: Secondary | ICD-10-CM

## 2021-07-31 DIAGNOSIS — J9611 Chronic respiratory failure with hypoxia: Secondary | ICD-10-CM | POA: Diagnosis not present

## 2021-07-31 NOTE — Patient Instructions (Addendum)
We will order you for humidification for your home oxygen at night.  ? ?Continue breztri 2 puffs twice daily ? ?Use albuterol as needed ? ?Recommend consistent food intake for nutrional purposes to reduce lung infections in the future. I also recommend taking multiple short 5-10 minute walks throughout the day to maintain your conditioning as you go through chemo.  ? ?Follow up in a 6 months. ? ? ? ? ?

## 2021-07-31 NOTE — Progress Notes (Signed)
? ?Synopsis: Referred in June 2022 for COPD by Dorothyann Peng, NP ? ?Subjective:  ? ?PATIENT ID: Samuel [name] GENDER: male DOB: December 20, 1950, MRN: [id number] ? ?HPI ? ?Chief Complaint  ?Patient presents with  ? Follow-up  ?  3 mo f/u for emphysema. States his breathing has stable since last visit.   ? ?Samuel [name] is a 71 year old male, former smoker with CKDIII, hyperlipidemia and hypertension who returns to pulmonary clinic for COPD follow up. ? 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 He was started on carboplatin and gemcitabine 06/20/21. He was admitted 2/24 to 2/27 for neutropenia and UTI.  ? ?His breathing has been stable on breztri. He has not need nebulizers or albuterol much since he has been very inactive due to fatigue from chemo. He has lost a few lbs since starting chemo due to lack of appetite.  ? ?He complains of nose bleeds since starting home nocturnal oxygen. He does not have humidification setup. ? ?OV 04/20/21 ?He was seen on 03/29/21 by Marland Kitchen, NP for an acute sick visit with increasing shortness of breath and pre-syncopal episode. Chest radiograph was obtained with no acute changes. Laboratory workup showed worsening renal function and he was advised to go to the ER. He was admitted 11/18 to 11/22 for AKI and obstructive uropathy due to recurrent urothelial cancer. He is being followed by Nephrology and Urology. He is scheduled for bladder surgery on 12/28 with Urology for evaluation of the cancer involvement.  ? ?Since admission he has been doing well from a respiratory standpoint. Intermittent wheezing reported. No cough or sputum production. The exertional dyspnea is back to baseline. ? ?OV 11/08/20 ?He reports very limited physical activity due to limitations from his shortness of breath.  He lives a fairly sedentary life at this time.  He is accompanied by his wife today.  He is currently on Breztri 2 puffs  twice daily where he transitioned from Trelegy back and January 2022 with notable improvement in his wheezing.  He does report increased frequency of wheezing in the evening that he notices when going to bed. ? ?He had lung cancer screening CT chest 09/27/2020 with findings of diffuse centrilobular emphysema and no concerning nodules. ? ?He does not have a nebulizer machine at home. ? ?Past Medical History:  ?Diagnosis Date  ? Anemia   ? Anxiety   ? Arthritis   ? Bladder cancer ([place])   ? urologist--- dr Tresa Moore  ? BPH with obstruction/lower urinary tract symptoms   ? CKD (chronic kidney disease), stage III ([place])   ? Congenital deformity of left hand   ? COPD (chronic obstructive pulmonary disease) ([place])   ? followed by pcp  ? Depression   ? Dyspnea   ? Essential hypertension   ? followed by pcp  ? History of adenomatous polyp of colon   ? Hyperlipidemia   ? Renal cysts, acquired, bilateral   ? Wears glasses   ?  ? ?Family History  ?Problem Relation Age of Onset  ? Hypertension Mother   ? Stroke Mother   ? Heart disease Father   ? Breast cancer Sister   ? Prostate cancer Brother   ? Hypertension Sister   ? Colon cancer Neg Hx   ? Esophageal cancer Neg Hx   ? Pancreatic cancer Neg Hx   ? Rectal cancer Neg Hx   ? Stomach cancer Neg Hx   ?  ? ?Social  History  ? ?Socioeconomic History  ? Marital status: Married  ?  Spouse name: Not on file  ? Number of children: Not on file  ? Years of education: Not on file  ? Highest education level: 12th grade  ?Occupational History  ? Occupation: retired  ?Tobacco Use  ? Smoking status: Former  ?  Years: 58.00  ?  Types: Cigarettes  ?  Quit date: 01/16/2017  ?  Years since quitting: 4.5  ? Smokeless tobacco: Never  ?Vaping Use  ? Vaping Use: Never used  ?Substance and Sexual Activity  ? Alcohol use: No  ? Drug use: Never  ? Sexual activity: Not on file  ?Other Topics Concern  ? Not on file  ?Social History Narrative  ? Retired   ? Married   ?   ? He watches television  ? ?Social  Determinants of Health  ? ?Financial Resource Strain: Low Risk   ? Difficulty of Paying Living Expenses: Not very hard  ?Food Insecurity: Food Insecurity Present  ? Worried About Charity fundraiser in the Last Year: Sometimes true  ? Ran Out of Food in the Last Year: Never true  ?Transportation Needs: No Transportation Needs  ? Lack of Transportation (Medical): No  ? Lack of Transportation (Non-Medical): No  ?Physical Activity: Inactive  ? Days of Exercise per Week: 0 days  ? Minutes of Exercise per Session: 0 min  ?Stress: No Stress Concern Present  ? Feeling of Stress : Only a little  ?Social Connections: Moderately Isolated  ? Frequency of Communication with Friends and Family: Once a week  ? Frequency of Social Gatherings with Friends and Family: More than three times a week  ? Attends Religious Services: Never  ? Active Member of Clubs or Organizations: No  ? Attends Archivist Meetings: Never  ? Marital Status: Married  ?Intimate Partner Violence: Not At Risk  ? Fear of Current or Ex-Partner: No  ? Emotionally Abused: No  ? Physically Abused: No  ? Sexually Abused: No  ?  ? ?Allergies  ?Allergen Reactions  ? Penicillins Diarrhea and Nausea And Vomiting  ?  Did it involve swelling of the face/tongue/throat, SOB, or low BP? No ?Did it involve sudden or severe rash/hives, skin peeling, or any reaction on the inside of your mouth or nose?  ?Did you need to seek medical attention at a hospital or doctor's office? No ?When did it last happen?    Several Years Ago   ?If all above answers are "NO", may proceed with cephalosporin use. ? ?  ?  ? ?Outpatient Medications Prior to Visit  ?Medication Sig Dispense Refill  ? acetaminophen (TYLENOL) 325 MG tablet Take 325 mg by mouth every 6 (six) hours as needed for moderate pain or fever.    ? albuterol (PROVENTIL) (2.5 MG/3ML) 0.083% nebulizer solution Take 2.5 mg by nebulization in the morning and at bedtime.    ? albuterol (VENTOLIN HFA) 108 (90 Base) MCG/ACT  inhaler Inhale 2 puffs into the lungs every 6 (six) hours as needed for wheezing or shortness of breath. 8 g 6  ? amLODipine (NORVASC) 10 MG tablet Take 10 mg by mouth at bedtime.    ? aspirin EC 81 MG tablet Take 1 tablet (81 mg total) by mouth daily. Swallow whole. 90 tablet 3  ? atorvastatin (LIPITOR) 40 MG tablet Take 1 tablet (40 mg total) by mouth daily. 90 tablet 3  ? Budeson-Glycopyrrol-Formoterol (BREZTRI AEROSPHERE) 160-9-4.8 MCG/ACT AERO Inhale 2  puffs into the lungs in the morning and at bedtime. 11.8 g 11  ? Cholecalciferol (VITAMIN D3 PO) Take 1 tablet by mouth daily.    ? Cholecalciferol (VITAMIN D3) 25 MCG (1000 UT) CAPS     ? citalopram (CELEXA) 20 MG tablet Take 1 tablet (20 mg total) by mouth daily. (Patient taking differently: Take 20 mg by mouth at bedtime.) 90 tablet 1  ? Cyanocobalamin (B-12 PO) Take 1 capsule by mouth daily.    ? finasteride (PROSCAR) 5 MG tablet Take 5 mg by mouth daily.    ? furosemide (LASIX) 20 MG tablet Take 20 mg by mouth 3 (three) times a week.    ? lidocaine-prilocaine (EMLA) cream Apply 1 application topically as needed. 30 g 0  ? metoprolol succinate (TOPROL-XL) 25 MG 24 hr tablet Take 25 mg by mouth daily.    ? oxyCODONE (OXY IR/ROXICODONE) 5 MG immediate release tablet Take 1 tablet (5 mg total) by mouth every 6 (six) hours as needed for moderate pain or severe pain (Post-operatively). 25 tablet 0  ? OXYGEN Inhale 2 L into the lungs See admin instructions. 2L when taking a nap and at bedtime    ? prochlorperazine (COMPAZINE) 10 MG tablet TAKE 1 TABLET(10 MG) BY MOUTH EVERY 6 HOURS AS NEEDED FOR NAUSEA OR VOMITING 30 tablet 0  ? tamsulosin (FLOMAX) 0.4 MG CAPS capsule Take 2 capsules by mouth at bedtime.    ? ?No facility-administered medications prior to visit.  ? ?Review of Systems  ?Constitutional:  Negative for chills, fever, malaise/fatigue and weight loss.  ?HENT:  Positive for nosebleeds. Negative for congestion, sinus pain and sore throat.   ?Eyes:  Negative.   ?Respiratory:  Positive for shortness of breath. Negative for cough, hemoptysis, sputum production and wheezing.   ?Cardiovascular:  Negative for chest pain, palpitations, orthopnea, claudication and leg swel

## 2021-08-01 ENCOUNTER — Inpatient Hospital Stay: Payer: Medicare Other

## 2021-08-01 ENCOUNTER — Inpatient Hospital Stay (HOSPITAL_BASED_OUTPATIENT_CLINIC_OR_DEPARTMENT_OTHER): Payer: Medicare Other | Admitting: Oncology

## 2021-08-01 ENCOUNTER — Telehealth: Payer: Self-pay | Admitting: *Deleted

## 2021-08-01 VITALS — BP 120/59 | HR 106 | Temp 98.0°F | Resp 17 | Ht 63.0 in | Wt 142.8 lb

## 2021-08-01 DIAGNOSIS — N189 Chronic kidney disease, unspecified: Secondary | ICD-10-CM | POA: Diagnosis not present

## 2021-08-01 DIAGNOSIS — C679 Malignant neoplasm of bladder, unspecified: Secondary | ICD-10-CM

## 2021-08-01 DIAGNOSIS — Z7982 Long term (current) use of aspirin: Secondary | ICD-10-CM | POA: Diagnosis not present

## 2021-08-01 DIAGNOSIS — Z79899 Other long term (current) drug therapy: Secondary | ICD-10-CM | POA: Diagnosis not present

## 2021-08-01 DIAGNOSIS — Z95828 Presence of other vascular implants and grafts: Secondary | ICD-10-CM | POA: Diagnosis not present

## 2021-08-01 DIAGNOSIS — D709 Neutropenia, unspecified: Secondary | ICD-10-CM | POA: Diagnosis not present

## 2021-08-01 DIAGNOSIS — Z5111 Encounter for antineoplastic chemotherapy: Secondary | ICD-10-CM | POA: Diagnosis not present

## 2021-08-01 DIAGNOSIS — N1330 Unspecified hydronephrosis: Secondary | ICD-10-CM | POA: Diagnosis not present

## 2021-08-01 LAB — CBC WITH DIFFERENTIAL (CANCER CENTER ONLY)
Abs Immature Granulocytes: 0.52 10*3/uL — ABNORMAL HIGH (ref 0.00–0.07)
Basophils Absolute: 0.1 10*3/uL (ref 0.0–0.1)
Basophils Relative: 1 %
Eosinophils Absolute: 0 10*3/uL (ref 0.0–0.5)
Eosinophils Relative: 1 %
HCT: 24.3 % — ABNORMAL LOW (ref 39.0–52.0)
Hemoglobin: 7.6 g/dL — ABNORMAL LOW (ref 13.0–17.0)
Immature Granulocytes: 7 %
Lymphocytes Relative: 13 %
Lymphs Abs: 0.9 10*3/uL (ref 0.7–4.0)
MCH: 27 pg (ref 26.0–34.0)
MCHC: 31.3 g/dL (ref 30.0–36.0)
MCV: 86.5 fL (ref 80.0–100.0)
Monocytes Absolute: 1.8 10*3/uL — ABNORMAL HIGH (ref 0.1–1.0)
Monocytes Relative: 26 %
Neutro Abs: 3.7 10*3/uL (ref 1.7–7.7)
Neutrophils Relative %: 52 %
Platelet Count: 429 10*3/uL — ABNORMAL HIGH (ref 150–400)
RBC: 2.81 MIL/uL — ABNORMAL LOW (ref 4.22–5.81)
RDW: 19.1 % — ABNORMAL HIGH (ref 11.5–15.5)
Smear Review: NORMAL
WBC Count: 7 10*3/uL (ref 4.0–10.5)
nRBC: 1.3 % — ABNORMAL HIGH (ref 0.0–0.2)

## 2021-08-01 LAB — CMP (CANCER CENTER ONLY)
ALT: 18 U/L (ref 0–44)
AST: 18 U/L (ref 15–41)
Albumin: 3.4 g/dL — ABNORMAL LOW (ref 3.5–5.0)
Alkaline Phosphatase: 98 U/L (ref 38–126)
Anion gap: 10 (ref 5–15)
BUN: 21 mg/dL (ref 8–23)
CO2: 25 mmol/L (ref 22–32)
Calcium: 9.1 mg/dL (ref 8.9–10.3)
Chloride: 102 mmol/L (ref 98–111)
Creatinine: 2 mg/dL — ABNORMAL HIGH (ref 0.61–1.24)
GFR, Estimated: 35 mL/min — ABNORMAL LOW (ref >60)
Glucose, Bld: 138 mg/dL — ABNORMAL HIGH (ref 70–99)
Potassium: 4.1 mmol/L (ref 3.5–5.1)
Sodium: 137 mmol/L (ref 135–145)
Total Bilirubin: 0.3 mg/dL (ref 0.3–1.2)
Total Protein: 6.9 g/dL (ref 6.5–8.1)

## 2021-08-01 MED ORDER — SODIUM CHLORIDE 0.9 % IV SOLN: Freq: Once | INTRAVENOUS | Status: DC

## 2021-08-01 MED ORDER — SODIUM CHLORIDE 0.9 % IV SOLN: Freq: Once | INTRAVENOUS | Status: AC

## 2021-08-01 MED ORDER — SODIUM CHLORIDE 0.9 % IV SOLN
150.0000 mg | Freq: Once | INTRAVENOUS | Status: AC
  Administered 2021-08-01: 150 mg via INTRAVENOUS
  Filled 2021-08-01: qty 150

## 2021-08-01 MED ORDER — SODIUM CHLORIDE 0.9% FLUSH
10.0000 mL | Freq: Once | INTRAVENOUS | Status: AC
  Administered 2021-08-01: 10 mL

## 2021-08-01 MED ORDER — PALONOSETRON HCL INJECTION 0.25 MG/5ML
0.2500 mg | Freq: Once | INTRAVENOUS | Status: AC
  Administered 2021-08-01: 0.25 mg via INTRAVENOUS
  Filled 2021-08-01: qty 5

## 2021-08-01 MED ORDER — SODIUM CHLORIDE 0.9 % IV SOLN
10.0000 mg | Freq: Once | INTRAVENOUS | Status: AC
  Administered 2021-08-01: 10 mg via INTRAVENOUS
  Filled 2021-08-01: qty 10

## 2021-08-01 MED ORDER — SODIUM CHLORIDE 0.9% FLUSH
10.0000 mL | INTRAVENOUS | Status: DC | PRN
  Administered 2021-08-01: 10 mL

## 2021-08-01 MED ORDER — SODIUM CHLORIDE 0.9 % IV SOLN
286.0000 mg | Freq: Once | INTRAVENOUS | Status: AC
  Administered 2021-08-01: 290 mg via INTRAVENOUS
  Filled 2021-08-01: qty 29

## 2021-08-01 MED ORDER — HEPARIN SOD (PORK) LOCK FLUSH 100 UNIT/ML IV SOLN
500.0000 [IU] | Freq: Once | INTRAVENOUS | Status: AC | PRN
  Administered 2021-08-01: 500 [IU]

## 2021-08-01 MED ORDER — SODIUM CHLORIDE 0.9 % IV SOLN
1000.0000 mg/m2 | Freq: Once | INTRAVENOUS | Status: AC
  Administered 2021-08-01: 1748 mg via INTRAVENOUS
  Filled 2021-08-01: qty 45.97

## 2021-08-01 NOTE — Progress Notes (Signed)
Hematology and Oncology Follow Up Visit ? ?Samuel [name] ?[id number] ?04/19/1951 71 y.o. ?08/01/2021 11:26 AM ?Deveron Furlong, NP  ? ?Principle Diagnosis: 71 year old with bladder cancer diagnosed in 2022.  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  ? ? ?Prior Therapy: ? ?He is status post BCG treatment and June 2022. ? ?Current therapy: Carboplatin and gemcitabine started on June 20, 2021.   He is here for day 1 cycle 3 of therapy. ? ?Interim History: [name] is here for a follow-up visit.  Since last visit, he reports no major changes in his health.  He has tolerated the last cycle of chemotherapy without any major complaints.  He denies any nausea, vomiting or abdominal pain.  He denies any excessive fatigue or tiredness.  He denies any hospitalizations or illnesses.  He still has bleeding from his penis but no bleeding via his nephrostomy tubes.  ? ? ? ? ? ?Medications: Updated on review. ?Current Outpatient Medications  ?Medication Sig Dispense Refill  ? acetaminophen (TYLENOL) 325 MG tablet Take 325 mg by mouth every 6 (six) hours as needed for moderate pain or fever.    ? albuterol (PROVENTIL) (2.5 MG/3ML) 0.083% nebulizer solution Take 2.5 mg by nebulization in the morning and at bedtime.    ? albuterol (VENTOLIN HFA) 108 (90 Base) MCG/ACT inhaler Inhale 2 puffs into the lungs every 6 (six) hours as needed for wheezing or shortness of breath. 8 g 6  ? amLODipine (NORVASC) 10 MG tablet Take 10 mg by mouth at bedtime.    ? aspirin EC 81 MG tablet Take 1 tablet (81 mg total) by mouth daily. Swallow whole. 90 tablet 3  ? atorvastatin (LIPITOR) 40 MG tablet Take 1 tablet (40 mg total) by mouth daily. 90 tablet 3  ? Budeson-Glycopyrrol-Formoterol (BREZTRI AEROSPHERE) 160-9-4.8 MCG/ACT AERO Inhale 2 puffs into the lungs in the morning and at bedtime. 11.8 g 11  ? Cholecalciferol  (VITAMIN D3 PO) Take 1 tablet by mouth daily.    ? Cholecalciferol (VITAMIN D3) 25 MCG (1000 UT) CAPS     ? citalopram (CELEXA) 20 MG tablet Take 1 tablet (20 mg total) by mouth daily. (Patient taking differently: Take 20 mg by mouth at bedtime.) 90 tablet 1  ? Cyanocobalamin (B-12 PO) Take 1 capsule by mouth daily.    ? finasteride (PROSCAR) 5 MG tablet Take 5 mg by mouth daily.    ? furosemide (LASIX) 20 MG tablet Take 20 mg by mouth 3 (three) times a week.    ? lidocaine-prilocaine (EMLA) cream Apply 1 application topically as needed. 30 g 0  ? metoprolol succinate (TOPROL-XL) 25 MG 24 hr tablet Take 25 mg by mouth daily.    ? oxyCODONE (OXY IR/ROXICODONE) 5 MG immediate release tablet Take 1 tablet (5 mg total) by mouth every 6 (six) hours as needed for moderate pain or severe pain (Post-operatively). 25 tablet 0  ? OXYGEN Inhale 2 L into the lungs See admin instructions. 2L when taking a nap and at bedtime    ? prochlorperazine (COMPAZINE) 10 MG tablet TAKE 1 TABLET(10 MG) BY MOUTH EVERY 6 HOURS AS NEEDED FOR NAUSEA OR VOMITING 30 tablet 0  ? tamsulosin (FLOMAX) 0.4 MG CAPS capsule Take 2 capsules by mouth at bedtime.    ? ?No current facility-administered medications for this visit.  ? ? ? ?Allergies:  ?Allergies  ?Allergen Reactions  ? Penicillins Diarrhea and Nausea  And Vomiting  ?  Did it involve swelling of the face/tongue/throat, SOB, or low BP? No ?Did it involve sudden or severe rash/hives, skin peeling, or any reaction on the inside of your mouth or nose?  ?Did you need to seek medical attention at a hospital or doctor's office? No ?When did it last happen?    Several Years Ago   ?If all above answers are "NO", may proceed with cephalosporin use. ? ?  ? ? ? ? ?Physical Exam: ? ?Blood pressure (!) 120/59, pulse (!) 106, temperature 98 ?F (36.7 ?C), temperature source Axillary, resp. rate 17, height '5\' 3"'$  (1.6 m), weight 142 lb 12.8 oz (64.8 kg), SpO2 99 %. ? ?ECOG: 1 ? ? ?General appearance: Alert,  awake without any distress. ?Head: Atraumatic without abnormalities ?Oropharynx: Without any thrush or ulcers. ?Eyes: No scleral icterus. ?Lymph nodes: No lymphadenopathy noted in the cervical, supraclavicular, or axillary nodes ?Heart:regular rate and rhythm, without any murmurs or gallops.   ?Lung: Clear to auscultation without any rhonchi, wheezes or dullness to percussion. ?Abdomin: Soft, nontender without any shifting dullness or ascites. ?Musculoskeletal: No clubbing or cyanosis. ?Neurological: No motor or sensory deficits. ?Skin: No rashes or lesions. ? ? ? ? ?Lab Results: ?Lab Results  ?Component Value Date  ? WBC 5.8 07/18/2021  ? HGB 7.8 (L) 07/18/2021  ? HCT 24.0 (L) 07/18/2021  ? MCV 83.9 07/18/2021  ? PLT 559 (H) 07/18/2021  ? ?  Chemistry   ?   ?Component Value Date/Time  ? NA 141 07/18/2021 1223  ? K 4.4 07/18/2021 1223  ? CL 107 07/18/2021 1223  ? CO2 26 07/18/2021 1223  ? BUN 35 (H) 07/18/2021 1223  ? CREATININE 1.65 (H) 07/18/2021 1223  ? CREATININE 2.14 (H) 04/04/2020 0748  ?    ?Component Value Date/Time  ? CALCIUM 8.6 (L) 07/18/2021 1223  ? ALKPHOS 95 07/18/2021 1223  ? AST 52 (H) 07/18/2021 1223  ? ALT 85 (H) 07/18/2021 1223  ? BILITOT 0.2 (L) 07/18/2021 1223  ?  ? ? ? ? ? ? ?Impression and Plan: ? ? ?71 year old with: ?  ?1.  Bladder cancer diagnosed in 2022 without muscle invasion.  He subsequently developed stage IV high-grade urothelial carcinoma and small cell features with pelvic adenopathy diagnosed in December 2022. ?  ?He is currently on palliative chemotherapy utilizing gemcitabine and carboplatin without any major complications.  Risks and benefits of proceeding with this treatment were discussed.  Alternative treatment options including immunotherapy as well as antibody drug conjugate among other options if he has progression of disease.  I recommend updating his staging scan before the next visit. ?  ? ?The plan is to complete cycle 3 of therapy and switch to immunotherapy  maintenance after that.  Complication associated with Pembrolizumab including nausea, vomiting, immune mediated complications clinic pneumonitis, colitis among others. ?  ? ?  ?  ?2.  IV access: Port-A-Cath remains in use without any issues. ?  ?3.  Antiemetics: No nausea or vomiting reported at this time.  Compazine is available to him. ?  ?4.  Chronic Kidney disease: His creatinine clearance around 40 cc/min.  Nephrostomy tube remains in place for hydronephrosis. ?  ?5.  Goals of care: Therapy remains palliative although aggressive measures are warranted given his reasonable pulm status. ?  ?6.  Follow-up: In 1 week to complete the current cycle, and in 3 weeks for the start of the next cycle. ?  ?30  minutes were dedicated to this  visit.  Time spent on reviewing laboratory data, disease status update and outlining future plan of care discussion. ?  ? ? ?Zola Button, MD ?3/22/202311:26 AM ? ?

## 2021-08-01 NOTE — Addendum Note (Signed)
Addended by: Wyatt Portela on: 08/01/2021 12:05 PM ? ? Modules accepted: Orders ? ?

## 2021-08-01 NOTE — Telephone Encounter (Signed)
Per Dr.Shadad, OK to treat with hgb 7.6 and Scr 2.0 ?

## 2021-08-01 NOTE — Progress Notes (Signed)
DISCONTINUE ON PATHWAY REGIMEN - Bladder ? ? ?  A cycle is every 21 days: ?    Carboplatin  ?    Gemcitabine  ? ?**Always confirm dose/schedule in your pharmacy ordering system** ? ?REASON: Toxicities / Adverse Event ?PRIOR TREATMENT: [id number]: Carboplatin AUC=5 D1 + Gemcitabine 1,000 mg/m2 D1, 8 q21 Days for a Maximum of 6 Cycles ?TREATMENT RESPONSE: Unable to Evaluate ? ?START ON PATHWAY REGIMEN - Bladder ? ? ?  A cycle is every 21 days: ?    Pembrolizumab  ? ?**Always confirm dose/schedule in your pharmacy ordering system** ? ?Patient Characteristics: ?Advanced/Metastatic Disease, Second Line, FGFR2/FGFR3 Mutation Negative or Unknown, Prior Platinum-Based Therapy and No Prior PD-1/PD-L1 Inhibitor ?Therapeutic Status: Advanced/Metastatic Disease ?Line of Therapy: Second Line ?HLKT6/YBWL8 Mutation Status: Did Not Order Test ?Intent of Therapy: ?Non-Curative / Palliative Intent, Discussed with Patient ?

## 2021-08-01 NOTE — Patient Instructions (Signed)
[facility]  Discharge Instructions: ?Thank you for choosing [facility] to provide your oncology and hematology care.  ? ?If you have a lab appointment with the [facility], please go directly to the [facility] and check in at the registration area. ?  ?Wear comfortable clothing and clothing appropriate for easy access to any Portacath or PICC line.  ? ?We strive to give you quality time with your provider. You may need to reschedule your appointment if you arrive late (15 or more minutes).  Arriving late affects you and other patients whose appointments are after yours.  Also, if you miss three or more appointments without notifying the office, you may be dismissed from the clinic at the provider?s discretion.    ?  ?For prescription refill requests, have your pharmacy contact our office and allow 72 hours for refills to be completed.   ? ?Today you received the following chemotherapy and/or immunotherapy agents gemcitabine, carboplatin    ?  ?To help prevent nausea and vomiting after your treatment, we encourage you to take your nausea medication as directed. ? ?BELOW ARE SYMPTOMS THAT SHOULD BE REPORTED IMMEDIATELY: ?*FEVER GREATER THAN 100.4 F (38 ?C) OR HIGHER ?*CHILLS OR SWEATING ?*NAUSEA AND VOMITING THAT IS NOT CONTROLLED WITH YOUR NAUSEA MEDICATION ?*UNUSUAL SHORTNESS OF BREATH ?*UNUSUAL BRUISING OR BLEEDING ?*URINARY PROBLEMS (pain or burning when urinating, or frequent urination) ?*BOWEL PROBLEMS (unusual diarrhea, constipation, pain near the anus) ?TENDERNESS IN MOUTH AND THROAT WITH OR WITHOUT PRESENCE OF ULCERS (sore throat, sores in mouth, or a toothache) ?UNUSUAL RASH, SWELLING OR PAIN  ?UNUSUAL VAGINAL DISCHARGE OR ITCHING  ? ?Items with * indicate a potential emergency and should be followed up as soon as possible or go to the Emergency Department if any problems should occur. ? ?Please show the CHEMOTHERAPY ALERT CARD or IMMUNOTHERAPY ALERT CARD  at check-in to the Emergency Department and triage nurse. ? ?Should you have questions after your visit or need to cancel or reschedule your appointment, please contact [facility]  Dept: [phone]  and follow the prompts.  Office hours are 8:00 a.m. to 4:30 p.m. Monday - Friday. Please note that voicemails left after 4:00 p.m. may not be returned until the following business day.  We are closed weekends and major holidays. You have access to a nurse at all times for urgent questions. Please call the main number to the clinic Dept: [phone] and follow the prompts. ? ? ?For any non-urgent questions, you may also contact your provider using MyChart. We now offer e-Visits for anyone 79 and older to request care online for non-urgent symptoms. For details visit [email or web address]. ?  ?Also download the MyChart app! Go to the app store, search "MyChart", open the app, select [facility], and log in with your MyChart username and password. ? ?Due to Covid, a mask is required upon entering the hospital/clinic. If you do not have a mask, one will be given to you upon arrival. For doctor visits, patients may have 1 support person aged 9 or older with them. For treatment visits, patients cannot have anyone with them due to current Covid guidelines and our immunocompromised population.  ? ?

## 2021-08-01 NOTE — Progress Notes (Signed)
Okay to treat with hgb 7.6 and scr 2.0 per dr Alen Blew  ?

## 2021-08-08 ENCOUNTER — Inpatient Hospital Stay: Payer: Medicare Other

## 2021-08-08 ENCOUNTER — Encounter: Payer: Self-pay | Admitting: *Deleted

## 2021-08-08 ENCOUNTER — Other Ambulatory Visit: Payer: Self-pay | Admitting: *Deleted

## 2021-08-08 ENCOUNTER — Other Ambulatory Visit: Payer: Self-pay

## 2021-08-08 DIAGNOSIS — C679 Malignant neoplasm of bladder, unspecified: Secondary | ICD-10-CM

## 2021-08-08 DIAGNOSIS — D709 Neutropenia, unspecified: Secondary | ICD-10-CM | POA: Diagnosis not present

## 2021-08-08 DIAGNOSIS — N1330 Unspecified hydronephrosis: Secondary | ICD-10-CM | POA: Diagnosis not present

## 2021-08-08 DIAGNOSIS — N189 Chronic kidney disease, unspecified: Secondary | ICD-10-CM | POA: Diagnosis not present

## 2021-08-08 DIAGNOSIS — Z79899 Other long term (current) drug therapy: Secondary | ICD-10-CM | POA: Diagnosis not present

## 2021-08-08 DIAGNOSIS — Z5111 Encounter for antineoplastic chemotherapy: Secondary | ICD-10-CM | POA: Diagnosis not present

## 2021-08-08 DIAGNOSIS — Z95828 Presence of other vascular implants and grafts: Secondary | ICD-10-CM

## 2021-08-08 DIAGNOSIS — Z7982 Long term (current) use of aspirin: Secondary | ICD-10-CM | POA: Diagnosis not present

## 2021-08-08 LAB — CBC WITH DIFFERENTIAL (CANCER CENTER ONLY)
Abs Immature Granulocytes: 0.02 10*3/uL (ref 0.00–0.07)
Basophils Absolute: 0.1 10*3/uL (ref 0.0–0.1)
Basophils Relative: 1 %
Eosinophils Absolute: 0 10*3/uL (ref 0.0–0.5)
Eosinophils Relative: 0 %
HCT: 21.2 % — ABNORMAL LOW (ref 39.0–52.0)
Hemoglobin: 6.8 g/dL — CL (ref 13.0–17.0)
Immature Granulocytes: 0 %
Lymphocytes Relative: 12 %
Lymphs Abs: 0.5 10*3/uL — ABNORMAL LOW (ref 0.7–4.0)
MCH: 27.3 pg (ref 26.0–34.0)
MCHC: 32.1 g/dL (ref 30.0–36.0)
MCV: 85.1 fL (ref 80.0–100.0)
Monocytes Absolute: 0.3 10*3/uL (ref 0.1–1.0)
Monocytes Relative: 6 %
Neutro Abs: 3.6 10*3/uL (ref 1.7–7.7)
Neutrophils Relative %: 81 %
Platelet Count: 415 10*3/uL — ABNORMAL HIGH (ref 150–400)
RBC: 2.49 MIL/uL — ABNORMAL LOW (ref 4.22–5.81)
RDW: 19 % — ABNORMAL HIGH (ref 11.5–15.5)
WBC Count: 4.5 10*3/uL (ref 4.0–10.5)
nRBC: 0 % (ref 0.0–0.2)

## 2021-08-08 LAB — CMP (CANCER CENTER ONLY)
ALT: 17 U/L (ref 0–44)
AST: 23 U/L (ref 15–41)
Albumin: 3.2 g/dL — ABNORMAL LOW (ref 3.5–5.0)
Alkaline Phosphatase: 87 U/L (ref 38–126)
Anion gap: 9 (ref 5–15)
BUN: 31 mg/dL — ABNORMAL HIGH (ref 8–23)
CO2: 24 mmol/L (ref 22–32)
Calcium: 8.4 mg/dL — ABNORMAL LOW (ref 8.9–10.3)
Chloride: 103 mmol/L (ref 98–111)
Creatinine: 1.81 mg/dL — ABNORMAL HIGH (ref 0.61–1.24)
GFR, Estimated: 39 mL/min — ABNORMAL LOW (ref >60)
Glucose, Bld: 172 mg/dL — ABNORMAL HIGH (ref 70–99)
Potassium: 3.8 mmol/L (ref 3.5–5.1)
Sodium: 136 mmol/L (ref 135–145)
Total Bilirubin: 0.3 mg/dL (ref 0.3–1.2)
Total Protein: 6.6 g/dL (ref 6.5–8.1)

## 2021-08-08 LAB — PREPARE RBC (CROSSMATCH): Order Confirmation: 1

## 2021-08-08 LAB — TSH: TSH: 2.242 u[IU]/mL (ref 0.320–4.118)

## 2021-08-08 MED ORDER — ACETAMINOPHEN 325 MG PO TABS
650.0000 mg | ORAL_TABLET | Freq: Once | ORAL | Status: AC
  Administered 2021-08-08: 650 mg via ORAL
  Filled 2021-08-08: qty 2

## 2021-08-08 MED ORDER — SODIUM CHLORIDE 0.9% FLUSH
10.0000 mL | Freq: Once | INTRAVENOUS | Status: AC
  Administered 2021-08-08: 10 mL

## 2021-08-08 MED ORDER — HEPARIN SOD (PORK) LOCK FLUSH 100 UNIT/ML IV SOLN
500.0000 [IU] | Freq: Once | INTRAVENOUS | Status: AC
  Administered 2021-08-08: 500 [IU]

## 2021-08-08 MED ORDER — DIPHENHYDRAMINE HCL 25 MG PO CAPS
25.0000 mg | ORAL_CAPSULE | Freq: Once | ORAL | Status: AC
  Administered 2021-08-08: 25 mg via ORAL
  Filled 2021-08-08: qty 1

## 2021-08-08 MED ORDER — SODIUM CHLORIDE 0.9% IV SOLUTION
250.0000 mL | Freq: Once | INTRAVENOUS | Status: AC
  Administered 2021-08-08: 250 mL via INTRAVENOUS

## 2021-08-08 NOTE — Progress Notes (Signed)
CRITICAL VALUE STICKER ? ?CRITICAL VALUE: Hgb 6.8 ? ?RECEIVER (on-site recipient of call): Jabar Krysiak Yariela Tison  ? ?DATE & TIME NOTIFIED: 08/08/21 1232 ? ?MESSENGER (representative from lab): Ulice Dash  ? ?MD NOTIFIED:  Mingo Amber, RN with dr Alen Blew ? ?TIME OF NOTIFICATION: 1233 ? ?RESPONSE:  will notify provider  ?

## 2021-08-08 NOTE — Patient Instructions (Signed)
Blood Transfusion, Adult, Care After This sheet gives you information about how to care for yourself after your procedure. Your doctor may also give you more specific instructions. If you have problems or questions, contact your doctor. What can I expect after the procedure? After the procedure, it is common to have: Bruising and soreness at the IV site. A headache. Follow these instructions at home: Insertion site care   Follow instructions from your doctor about how to take care of your insertion site. This is where an IV tube was put into your vein. Make sure you: Wash your hands with soap and water before and after you change your bandage (dressing). If you cannot use soap and water, use hand sanitizer. Change your bandage as told by your doctor. Check your insertion site every day for signs of infection. Check for: Redness, swelling, or pain. Bleeding from the site. Warmth. Pus or a bad smell. General instructions Take over-the-counter and prescription medicines only as told by your doctor. Rest as told by your doctor. Go back to your normal activities as told by your doctor. Keep all follow-up visits as told by your doctor. This is important. Contact a doctor if: You have itching or red, swollen areas of skin (hives). You feel worried or nervous (anxious). You feel weak after doing your normal activities. You have redness, swelling, warmth, or pain around the insertion site. You have blood coming from the insertion site, and the blood does not stop with pressure. You have pus or a bad smell coming from the insertion site. Get help right away if: You have signs of a serious reaction. This may be coming from an allergy or the body's defense system (immune system). Signs include: Trouble breathing or shortness of breath. Swelling of the face or feeling warm (flushed). Fever or chills. Head, chest, or back pain. Dark pee (urine) or blood in the pee. Widespread rash. Fast  heartbeat. Feeling dizzy or light-headed. You may receive your blood transfusion in an outpatient setting. If so, you will be told whom to contact to report any reactions. These symptoms may be an emergency. Do not wait to see if the symptoms will go away. Get medical help right away. Call your local emergency services (911 in the [place] Do not drive yourself to the hospital. Summary Bruising and soreness at the IV site are common. Check your insertion site every day for signs of infection. Rest as told by your doctor. Go back to your normal activities as told by your doctor. Get help right away if you have signs of a serious reaction. This information is not intended to replace advice given to you by your health care provider. Make sure you discuss any questions you have with your health care provider. Document Revised: 08/24/2020 Document Reviewed: 10/22/2018 Elsevier Patient Education  2022 [employer]  

## 2021-08-08 NOTE — Progress Notes (Signed)
Patient has multiple antibodies in his blood. Blood bank called to report they had difficulty with his screening and at 1600 had still not been able to run the type and screen. Patient would need to return tomorrow for both units of blood. Patient requested to stay accessed for blood transfusion tomorrow. He will return Thursday at [place] site dressing changed to include biopatch and sorbaview dressing. Patient confirms appointment time for tomorrow. ?

## 2021-08-08 NOTE — Progress Notes (Signed)
CRITICAL VALUE STICKER ? ?CRITICAL VALUE: Hgb 6.8 ? ?RECEIVER (on-site recipient of call): L. Chilcott RN ? ?DATE & TIME NOTIFIED: 08/08/21 @ 1234 ? ?MESSENGER (representative from lab): ? ?MD NOTIFIED: Dr. Alen Blew ? ?TIME OF NOTIFICATION: 1236 ? ?RESPONSE:  Patient to receive 2 units PRBC's ?

## 2021-08-09 ENCOUNTER — Other Ambulatory Visit: Payer: Self-pay | Admitting: *Deleted

## 2021-08-09 ENCOUNTER — Inpatient Hospital Stay: Payer: Medicare Other

## 2021-08-09 DIAGNOSIS — C679 Malignant neoplasm of bladder, unspecified: Secondary | ICD-10-CM

## 2021-08-09 DIAGNOSIS — N189 Chronic kidney disease, unspecified: Secondary | ICD-10-CM | POA: Diagnosis not present

## 2021-08-09 DIAGNOSIS — Z5111 Encounter for antineoplastic chemotherapy: Secondary | ICD-10-CM | POA: Diagnosis not present

## 2021-08-09 DIAGNOSIS — Z79899 Other long term (current) drug therapy: Secondary | ICD-10-CM | POA: Diagnosis not present

## 2021-08-09 DIAGNOSIS — N1330 Unspecified hydronephrosis: Secondary | ICD-10-CM | POA: Diagnosis not present

## 2021-08-09 DIAGNOSIS — D709 Neutropenia, unspecified: Secondary | ICD-10-CM | POA: Diagnosis not present

## 2021-08-09 DIAGNOSIS — Z7982 Long term (current) use of aspirin: Secondary | ICD-10-CM | POA: Diagnosis not present

## 2021-08-09 LAB — PREPARE RBC (CROSSMATCH)

## 2021-08-09 MED ORDER — SODIUM CHLORIDE 0.9% FLUSH
10.0000 mL | INTRAVENOUS | Status: AC | PRN
  Administered 2021-08-09: 10 mL

## 2021-08-09 MED ORDER — ACETAMINOPHEN 325 MG PO TABS
650.0000 mg | ORAL_TABLET | Freq: Once | ORAL | Status: AC
  Administered 2021-08-09: 650 mg via ORAL
  Filled 2021-08-09: qty 2

## 2021-08-09 MED ORDER — DIPHENHYDRAMINE HCL 25 MG PO CAPS
25.0000 mg | ORAL_CAPSULE | Freq: Once | ORAL | Status: AC
  Administered 2021-08-09: 25 mg via ORAL
  Filled 2021-08-09: qty 1

## 2021-08-09 MED ORDER — HEPARIN SOD (PORK) LOCK FLUSH 100 UNIT/ML IV SOLN
500.0000 [IU] | Freq: Every day | INTRAVENOUS | Status: AC | PRN
  Administered 2021-08-09: 500 [IU]

## 2021-08-09 MED ORDER — SODIUM CHLORIDE 0.9% IV SOLUTION
250.0000 mL | Freq: Once | INTRAVENOUS | Status: AC
  Administered 2021-08-09: 250 mL via INTRAVENOUS

## 2021-08-09 NOTE — Progress Notes (Signed)
Per Alen Blew MD, ok to run blood at 369m/hr today. ?

## 2021-08-09 NOTE — Patient Instructions (Signed)

## 2021-08-10 ENCOUNTER — Telehealth: Payer: Self-pay | Admitting: *Deleted

## 2021-08-10 LAB — TYPE AND SCREEN
ABO/RH(D): A POS
Antibody Screen: POSITIVE
DAT, IgG: NEGATIVE
Unit division: 0
Unit division: 0

## 2021-08-10 LAB — BPAM RBC
Blood Product Expiration Date: 202304202359
Blood Product Expiration Date: 202304202359
ISSUE DATE / TIME: 202303301251
ISSUE DATE / TIME: 202303301251
Unit Type and Rh: 6200
Unit Type and Rh: 6200

## 2021-08-10 NOTE — Telephone Encounter (Signed)
PC to patient, informed him he has CT of chest, abdomen, & pelvis ordered & authorized by his insurance - he may schedule this at any time.  Central Scheduling number given to patient, also informed him he needs to come pick up oral contrast at [facility] before his scan.  He verbalizes understanding. ?

## 2021-08-12 DIAGNOSIS — J9611 Chronic respiratory failure with hypoxia: Secondary | ICD-10-CM | POA: Diagnosis not present

## 2021-08-13 ENCOUNTER — Ambulatory Visit (HOSPITAL_COMMUNITY)
Admission: RE | Admit: 2021-08-13 | Discharge: 2021-08-13 | Disposition: A | Discharge status: Home / Self Care | Payer: Medicare Other | Source: Ambulatory Visit | Attending: Oncology | Admitting: Oncology

## 2021-08-13 ENCOUNTER — Other Ambulatory Visit: Payer: Self-pay | Admitting: Oncology

## 2021-08-13 ENCOUNTER — Other Ambulatory Visit (HOSPITAL_COMMUNITY): Payer: Self-pay | Admitting: Interventional Radiology

## 2021-08-13 DIAGNOSIS — Z436 Encounter for attention to other artificial openings of urinary tract: Secondary | ICD-10-CM | POA: Insufficient documentation

## 2021-08-13 DIAGNOSIS — C679 Malignant neoplasm of bladder, unspecified: Secondary | ICD-10-CM | POA: Diagnosis not present

## 2021-08-13 DIAGNOSIS — Z466 Encounter for fitting and adjustment of urinary device: Secondary | ICD-10-CM | POA: Diagnosis not present

## 2021-08-13 HISTORY — PX: IR NEPHROSTOMY EXCHANGE LEFT: IMG6069

## 2021-08-13 HISTORY — PX: IR NEPHROSTOMY EXCHANGE RIGHT: IMG6070

## 2021-08-13 MED ORDER — IOHEXOL 300 MG/ML  SOLN
50.0000 mL | Freq: Once | INTRAMUSCULAR | Status: AC | PRN
  Administered 2021-08-13: 15 mL

## 2021-08-13 MED ORDER — LIDOCAINE HCL 1 % IJ SOLN
INTRAMUSCULAR | Status: AC
  Filled 2021-08-13: qty 20

## 2021-08-15 NOTE — Progress Notes (Signed)
Pharmacist Chemotherapy Monitoring - Initial Assessment   ? ?Anticipated start date: 08/22/21  ? ?The following has been reviewed per standard work regarding the patient's treatment regimen: ?The patient's diagnosis, treatment plan and drug doses, and organ/hematologic function ?Lab orders and baseline tests specific to treatment regimen  ?The treatment plan start date, drug sequencing, and pre-medications ?Prior authorization status  ?Patient's documented medication list, including drug-drug interaction screen and prescriptions for anti-emetics and supportive care specific to the treatment regimen ?The drug concentrations, fluid compatibility, administration routes, and timing of the medications to be used ?The patient's access for treatment and lifetime cumulative dose history, if applicable  ?The patient's medication allergies and previous infusion related reactions, if applicable  ? ?Changes made to treatment plan:  ?N/A ? ?Follow up needed:  ?Pending authorization for treatment  ? ? ?Kennith Center, Pharm.D., CPP ?08/15/2021'@11'$ :07 AM ? ? ? ?

## 2021-08-20 ENCOUNTER — Ambulatory Visit (HOSPITAL_COMMUNITY)
Admission: RE | Admit: 2021-08-20 | Discharge: 2021-08-20 | Disposition: A | Discharge status: Home / Self Care | Payer: Medicare Other | Source: Ambulatory Visit | Attending: Oncology | Admitting: Oncology

## 2021-08-20 ENCOUNTER — Telehealth: Payer: Self-pay | Admitting: Oncology

## 2021-08-20 DIAGNOSIS — C679 Malignant neoplasm of bladder, unspecified: Secondary | ICD-10-CM | POA: Insufficient documentation

## 2021-08-20 DIAGNOSIS — N2889 Other specified disorders of kidney and ureter: Secondary | ICD-10-CM | POA: Diagnosis not present

## 2021-08-20 DIAGNOSIS — J432 Centrilobular emphysema: Secondary | ICD-10-CM | POA: Diagnosis not present

## 2021-08-20 NOTE — Telephone Encounter (Signed)
Called patient regarding upcoming appointments, patient is notified. 

## 2021-08-21 ENCOUNTER — Telehealth: Payer: Self-pay | Admitting: Adult Health

## 2021-08-21 NOTE — Progress Notes (Incomplete)
tEST ?

## 2021-08-21 NOTE — Telephone Encounter (Signed)
Left message for patient to call back and schedule Medicare Annual Wellness Visit (AWV) either virtually or in office. Left  my jabber number [phone]   Last AWV ;08/16/20  please schedule at anytime with [facility] Nurse Health Advisor 1 or 2    

## 2021-08-22 ENCOUNTER — Other Ambulatory Visit: Payer: Self-pay

## 2021-08-22 ENCOUNTER — Inpatient Hospital Stay: Payer: Medicare Other | Admitting: Oncology

## 2021-08-22 ENCOUNTER — Inpatient Hospital Stay: Payer: Medicare Other | Attending: Oncology

## 2021-08-22 ENCOUNTER — Inpatient Hospital Stay: Payer: Medicare Other

## 2021-08-22 VITALS — BP 113/70 | HR 85 | Temp 97.6°F | Resp 17 | Ht 63.0 in | Wt 143.3 lb

## 2021-08-22 DIAGNOSIS — R590 Localized enlarged lymph nodes: Secondary | ICD-10-CM | POA: Insufficient documentation

## 2021-08-22 DIAGNOSIS — Z79899 Other long term (current) drug therapy: Secondary | ICD-10-CM | POA: Diagnosis not present

## 2021-08-22 DIAGNOSIS — D6481 Anemia due to antineoplastic chemotherapy: Secondary | ICD-10-CM | POA: Diagnosis not present

## 2021-08-22 DIAGNOSIS — Z5111 Encounter for antineoplastic chemotherapy: Secondary | ICD-10-CM | POA: Insufficient documentation

## 2021-08-22 DIAGNOSIS — C679 Malignant neoplasm of bladder, unspecified: Secondary | ICD-10-CM | POA: Insufficient documentation

## 2021-08-22 DIAGNOSIS — N189 Chronic kidney disease, unspecified: Secondary | ICD-10-CM | POA: Insufficient documentation

## 2021-08-22 DIAGNOSIS — Z95828 Presence of other vascular implants and grafts: Secondary | ICD-10-CM

## 2021-08-22 LAB — CBC WITH DIFFERENTIAL (CANCER CENTER ONLY)
Abs Immature Granulocytes: 0.13 10*3/uL — ABNORMAL HIGH (ref 0.00–0.07)
Basophils Absolute: 0.1 10*3/uL (ref 0.0–0.1)
Basophils Relative: 1 %
Eosinophils Absolute: 0 10*3/uL (ref 0.0–0.5)
Eosinophils Relative: 0 %
HCT: 32.7 % — ABNORMAL LOW (ref 39.0–52.0)
Hemoglobin: 10.7 g/dL — ABNORMAL LOW (ref 13.0–17.0)
Immature Granulocytes: 2 %
Lymphocytes Relative: 14 %
Lymphs Abs: 0.9 10*3/uL (ref 0.7–4.0)
MCH: 28.4 pg (ref 26.0–34.0)
MCHC: 32.7 g/dL (ref 30.0–36.0)
MCV: 86.7 fL (ref 80.0–100.0)
Monocytes Absolute: 1.7 10*3/uL — ABNORMAL HIGH (ref 0.1–1.0)
Monocytes Relative: 27 %
Neutro Abs: 3.5 10*3/uL (ref 1.7–7.7)
Neutrophils Relative %: 56 %
Platelet Count: 397 10*3/uL (ref 150–400)
RBC: 3.77 MIL/uL — ABNORMAL LOW (ref 4.22–5.81)
RDW: 19.9 % — ABNORMAL HIGH (ref 11.5–15.5)
WBC Count: 6.3 10*3/uL (ref 4.0–10.5)
nRBC: 0 % (ref 0.0–0.2)

## 2021-08-22 LAB — CMP (CANCER CENTER ONLY)
ALT: 12 U/L (ref 0–44)
AST: 17 U/L (ref 15–41)
Albumin: 3.6 g/dL (ref 3.5–5.0)
Alkaline Phosphatase: 97 U/L (ref 38–126)
Anion gap: 9 (ref 5–15)
BUN: 34 mg/dL — ABNORMAL HIGH (ref 8–23)
CO2: 24 mmol/L (ref 22–32)
Calcium: 9.5 mg/dL (ref 8.9–10.3)
Chloride: 104 mmol/L (ref 98–111)
Creatinine: 1.96 mg/dL — ABNORMAL HIGH (ref 0.61–1.24)
GFR, Estimated: 36 mL/min — ABNORMAL LOW (ref >60)
Glucose, Bld: 107 mg/dL — ABNORMAL HIGH (ref 70–99)
Potassium: 4.2 mmol/L (ref 3.5–5.1)
Sodium: 137 mmol/L (ref 135–145)
Total Bilirubin: 0.3 mg/dL (ref 0.3–1.2)
Total Protein: 7.5 g/dL (ref 6.5–8.1)

## 2021-08-22 LAB — TSH: TSH: 2.526 u[IU]/mL (ref 0.320–4.118)

## 2021-08-22 MED ORDER — SODIUM CHLORIDE 0.9% FLUSH
10.0000 mL | INTRAVENOUS | Status: DC | PRN
  Administered 2021-08-22: 10 mL

## 2021-08-22 MED ORDER — SODIUM CHLORIDE 0.9 % IV SOLN
200.0000 mg | Freq: Once | INTRAVENOUS | Status: AC
  Administered 2021-08-22: 200 mg via INTRAVENOUS
  Filled 2021-08-22: qty 200

## 2021-08-22 MED ORDER — SODIUM CHLORIDE 0.9% FLUSH
10.0000 mL | Freq: Once | INTRAVENOUS | Status: AC
  Administered 2021-08-22: 10 mL

## 2021-08-22 MED ORDER — HEPARIN SOD (PORK) LOCK FLUSH 100 UNIT/ML IV SOLN
500.0000 [IU] | Freq: Once | INTRAVENOUS | Status: AC | PRN
  Administered 2021-08-22: 500 [IU]

## 2021-08-22 MED ORDER — SODIUM CHLORIDE 0.9 % IV SOLN: Freq: Once | INTRAVENOUS | Status: AC

## 2021-08-22 NOTE — Progress Notes (Signed)
Serum creatinine is 1.96- per Dr. Alen Blew with are ok to proceed with treatment. ?

## 2021-08-22 NOTE — Progress Notes (Signed)
Hematology and Oncology Follow Up Visit ? ?Samuel [name] ?[id number] ?[birth year] 71 y.o. ?08/22/2021 8:02 AM ?Deveron Furlong, NP  ? ?Principle Diagnosis: 71 year old with stage IV high-grade urothelial carcinoma of the bladder with small cell component diagnosed in December 2022 with pelvic adenopathy.  ? ? ?Prior Therapy: ? ?He is status post BCG treatment and June 2022. ?Carboplatin and gemcitabine started on June 20, 2021.   He completed 3 cycles of therapy in March 2023. ? ? ?Current therapy: Pembrolizumab 200 mg every 3 weeks with cycle 1 tentatively to start on August 22, 2021. ? ?Interim History: Samuel [name] is here for repeat evaluation.  Since the last visit, he reports feeling well without any major complaints.  He reports some mild dyspnea but no cough or hemoptysis.  He reports improvement in his exercise tolerance after transfusion.  He denies any nausea, vomiting or abdominal pain.  He denies any hospitalizations or illnesses. ? ? ? ? ?Medications: Reviewed without changes. ?Current Outpatient Medications  ?Medication Sig Dispense Refill  ? acetaminophen (TYLENOL) 325 MG tablet Take 325 mg by mouth every 6 (six) hours as needed for moderate pain or fever.    ? albuterol (PROVENTIL) (2.5 MG/3ML) 0.083% nebulizer solution Take 2.5 mg by nebulization in the morning and at bedtime.    ? albuterol (VENTOLIN HFA) 108 (90 Base) MCG/ACT inhaler Inhale 2 puffs into the lungs every 6 (six) hours as needed for wheezing or shortness of breath. 8 g 6  ? amLODipine (NORVASC) 10 MG tablet Take 10 mg by mouth at bedtime.    ? aspirin EC 81 MG tablet Take 1 tablet (81 mg total) by mouth daily. Swallow whole. 90 tablet 3  ? atorvastatin (LIPITOR) 40 MG tablet Take 1 tablet (40 mg total) by mouth daily. 90 tablet 3  ? Budeson-Glycopyrrol-Formoterol (BREZTRI AEROSPHERE) 160-9-4.8 MCG/ACT AERO Inhale 2 puffs into the lungs in the morning and at bedtime. 11.8 g 11  ? Cholecalciferol (VITAMIN D3  PO) Take 1 tablet by mouth daily.    ? Cholecalciferol (VITAMIN D3) 25 MCG (1000 UT) CAPS     ? citalopram (CELEXA) 20 MG tablet Take 1 tablet (20 mg total) by mouth daily. (Patient taking differently: Take 20 mg by mouth at bedtime.) 90 tablet 1  ? Cyanocobalamin (B-12 PO) Take 1 capsule by mouth daily.    ? finasteride (PROSCAR) 5 MG tablet Take 5 mg by mouth daily.    ? furosemide (LASIX) 20 MG tablet Take 20 mg by mouth 3 (three) times a week.    ? lidocaine-prilocaine (EMLA) cream Apply 1 application topically as needed. 30 g 0  ? metoprolol succinate (TOPROL-XL) 25 MG 24 hr tablet Take 25 mg by mouth daily.    ? oxyCODONE (OXY IR/ROXICODONE) 5 MG immediate release tablet Take 1 tablet (5 mg total) by mouth every 6 (six) hours as needed for moderate pain or severe pain (Post-operatively). 25 tablet 0  ? OXYGEN Inhale 2 L into the lungs See admin instructions. 2L when taking a nap and at bedtime    ? prochlorperazine (COMPAZINE) 10 MG tablet TAKE 1 TABLET(10 MG) BY MOUTH EVERY 6 HOURS AS NEEDED FOR NAUSEA OR VOMITING 30 tablet 0  ? tamsulosin (FLOMAX) 0.4 MG CAPS capsule Take 2 capsules by mouth at bedtime.    ? ?No current facility-administered medications for this visit.  ? ? ? ?Allergies:  ?Allergies  ?Allergen Reactions  ? Penicillins Diarrhea and Nausea And Vomiting  ?  Did it  involve swelling of the face/tongue/throat, SOB, or low BP? No ?Did it involve sudden or severe rash/hives, skin peeling, or any reaction on the inside of your mouth or nose?  ?Did you need to seek medical attention at a hospital or doctor's office? No ?When did it last happen?    Several Years Ago   ?If all above answers are "NO", may proceed with cephalosporin use. ? ?  ? ? ? ? ?Physical Exam: ? ?Blood pressure 113/70, pulse 85, temperature 97.6 ?F (36.4 ?C), temperature source Temporal, resp. rate 17, height '5\' 3"'$  (1.6 m), weight 143 lb 4.8 oz (65 kg), SpO2 100 %. ? ? ?ECOG: 1 ? ? ? ?General appearance: Comfortable appearing  without any discomfort ?Head: Normocephalic without any trauma ?Oropharynx: Mucous membranes are moist and pink without any thrush or ulcers. ?Eyes: Pupils are equal and round reactive to light. ?Lymph nodes: No cervical, supraclavicular, inguinal or axillary lymphadenopathy.   ?Heart:regular rate and rhythm.  S1 and S2 without leg edema. ?Lung: Clear without any rhonchi or wheezes.  No dullness to percussion. ?Abdomin: Soft, nontender, nondistended with good bowel sounds.  No hepatosplenomegaly. ?Musculoskeletal: No joint deformity or effusion.  Full range of motion noted. ?Neurological: No deficits noted on motor, sensory and deep tendon reflex exam. ?Skin: No petechial rash or dryness.  Appeared moist.  ? ? ? ? ?Lab Results: ?Lab Results  ?Component Value Date  ? WBC 4.5 08/08/2021  ? HGB 6.8 (LL) 08/08/2021  ? HCT 21.2 (L) 08/08/2021  ? MCV 85.1 08/08/2021  ? PLT 415 (H) 08/08/2021  ? ?  Chemistry   ?   ?Component Value Date/Time  ? NA 136 08/08/2021 1213  ? K 3.8 08/08/2021 1213  ? CL 103 08/08/2021 1213  ? CO2 24 08/08/2021 1213  ? BUN 31 (H) 08/08/2021 1213  ? CREATININE 1.81 (H) 08/08/2021 1213  ? CREATININE 2.14 (H) 04/04/2020 0748  ?    ?Component Value Date/Time  ? CALCIUM 8.4 (L) 08/08/2021 1213  ? ALKPHOS 87 08/08/2021 1213  ? AST 23 08/08/2021 1213  ? ALT 17 08/08/2021 1213  ? BILITOT 0.3 08/08/2021 1213  ?  ? ? ? ?IMPRESSION: ?1. Markedly thickened urinary bladder wall, presumably reflective of ?invasive urothelial neoplasm. Progressive lymphadenopathy in the ?abdomen and pelvis, indicative of metastatic disease. ?2. No definite metastatic disease identified in the thorax. ?3. Aortic atherosclerosis, in addition to left main and three-vessel ?coronary artery disease. Assessment for potential risk factor ?modification, dietary therapy or pharmacologic therapy may be ?warranted, if clinically indicated. ?4. Diffuse bronchial wall thickening with moderate centrilobular and ?paraseptal emphysema; imaging  findings suggestive of underlying ?COPD. ?5. Cholelithiasis without evidence of acute cholecystitis. ?6. Bilateral nephrostomy tubes appear appropriately located. No ?hydroureteronephrosis on today's examination. ?  ? ? ?Impression and Plan: ? ? ?71 year old with: ?  ?1.  Stage IV high-grade urothelial carcinoma of the bladder with small cell features with pelvic adenopathy diagnosed in December 2022. ?  ? ?CT scan obtained on August 20, 2021 was personally reviewed and showed overall Law mild progression of his lymphadenopathy.  Risks and benefits of suturing to immunotherapy were reviewed at this time.  Alternative treatment options including antibody drug conjugate versus traditional chemotherapy were with topotecan given his small cell component. ? ?After discussion today, he is agreeable to proceed with Pembrolizumab and will repeat imaging studies in the next 3 months. ? ?  ?  ?2.  IV access: Port-A-Cath continues to be in use without any complications. ?  ?  3.  Antiemetics: Compazine is available to him without any nausea or vomiting. ?  ?4.  Chronic Kidney disease: Creatinine clearance remains stable at this time.  He is not a cisplatin candidate. ?  ?5.  Goals of care: His disease is incurable although aggressive measures are warranted for the time being. ? ?6.  Anemia: Related to chemotherapy and renal failure.  He is status posttransfusion with improvement in his hemoglobin. ?  ?7.  Follow-up: In 3 weeks for the next cycle of therapy. ?  ?30  minutes were spent on this encounter.  The time was dedicated to reviewing laboratory data, disease status update, reviewing imaging studies and future plan of care discussion. ?  ? ? ?Zola Button, MD ?4/12/20238:02 AM ? ?

## 2021-08-22 NOTE — Patient Instructions (Addendum)
[facility]  Discharge Instructions: ?Thank you for choosing [facility] to provide your oncology and hematology care.  ? ?If you have a lab appointment with the [facility], please go directly to the [facility] and check in at the registration area. ?  ?Wear comfortable clothing and clothing appropriate for easy access to any Portacath or PICC line.  ? ?We strive to give you quality time with your provider. You may need to reschedule your appointment if you arrive late (15 or more minutes).  Arriving late affects you and other patients whose appointments are after yours.  Also, if you miss three or more appointments without notifying the office, you may be dismissed from the clinic at the provider?s discretion.    ?  ?For prescription refill requests, have your pharmacy contact our office and allow 72 hours for refills to be completed.   ? ?Today you received the following chemotherapy and/or immunotherapy agents pembrolizumab ? ?Pembrolizumab injection ? ?What is this medication? ?PEMBROLIZUMAB (pem broe liz ue mab) is a monoclonal antibody. It is used to treat certain types of cancer. ?This medicine may be used for other purposes; ask your health care provider or pharmacist if you have questions. ?COMMON BRAND NAME(S): Keytruda ?What should I tell my care team before I take this medication? ?They need to know if you have any of these conditions: ?autoimmune diseases like Crohn's disease, ulcerative colitis, or lupus ?have had or planning to have an allogeneic stem cell transplant (uses someone else's stem cells) ?history of organ transplant ?history of chest radiation ?nervous system problems like myasthenia gravis or Guillain-Barre syndrome ?an unusual or allergic reaction to pembrolizumab, other medicines, foods, dyes, or preservatives ?pregnant or trying to get pregnant ?breast-feeding ?How should I use this medication? ?This medicine is for infusion into a  vein. It is given by a health care professional in a hospital or clinic setting. ?A special MedGuide will be given to you before each treatment. Be sure to read this information carefully each time. ?Talk to your pediatrician regarding the use of this medicine in children. While this drug may be prescribed for children as young as 6 months for selected conditions, precautions do apply. ?Overdosage: If you think you have taken too much of this medicine contact a poison control center or emergency room at once. ?NOTE: This medicine is only for you. Do not share this medicine with others. ?What if I miss a dose? ?It is important not to miss your dose. Call your doctor or health care professional if you are unable to keep an appointment. ?What may interact with this medication? ?Interactions have not been studied. ?This list may not describe all possible interactions. Give your health care provider a list of all the medicines, herbs, non-prescription drugs, or dietary supplements you use. Also tell them if you smoke, drink alcohol, or use illegal drugs. Some items may interact with your medicine. ?What should I watch for while using this medication? ?Your condition will be monitored carefully while you are receiving this medicine. ?You may need blood work done while you are taking this medicine. ?Do not become pregnant while taking this medicine or for 4 months after stopping it. Women should inform their doctor if they wish to become pregnant or think they might be pregnant. There is a potential for serious side effects to an unborn child. Talk to your health care professional or pharmacist for more information. Do not breast-feed an infant while taking  this medicine or for 4 months after the last dose. ?What side effects may I notice from receiving this medication? ?Side effects that you should report to your doctor or health care professional as soon as possible: ?allergic reactions like skin rash, itching or hives,  swelling of the face, lips, or tongue ?bloody or black, tarry ?breathing problems ?changes in vision ?chest pain ?chills ?confusion ?constipation ?cough ?diarrhea ?dizziness or feeling faint or lightheaded ?fast or irregular heartbeat ?fever ?flushing ?joint pain ?low blood counts - this medicine may decrease the number of white blood cells, red blood cells and platelets. You may be at increased risk for infections and bleeding. ?muscle pain ?muscle weakness ?pain, tingling, numbness in the hands or feet ?persistent headache ?redness, blistering, peeling or loosening of the skin, including inside the mouth ?signs and symptoms of high blood sugar such as dizziness; dry mouth; dry skin; fruity breath; nausea; stomach pain; increased hunger or thirst; increased urination ?signs and symptoms of kidney injury like trouble passing urine or change in the amount of urine ?signs and symptoms of liver injury like dark urine, light-colored stools, loss of appetite, nausea, right upper belly pain, yellowing of the eyes or skin ?sweating ?swollen lymph nodes ?weight loss ?Side effects that usually do not require medical attention (report to your doctor or health care professional if they continue or are bothersome): ?decreased appetite ?hair loss ?tiredness ?This list may not describe all possible side effects. Call your doctor for medical advice about side effects. You may report side effects to FDA at 1-800-FDA-1088. ?Where should I keep my medication? ?This drug is given in a hospital or clinic and will not be stored at home. ?NOTE: This sheet is a summary. It may not cover all possible information. If you have questions about this medicine, talk to your doctor, pharmacist, or health care provider. ?? 2022 Elsevier/Gold Standard (2021-01-16 00:00:00) ?    ?  ?To help prevent nausea and vomiting after your treatment, we encourage you to take your nausea medication as directed. ? ?BELOW ARE SYMPTOMS THAT SHOULD BE REPORTED  IMMEDIATELY: ?*FEVER GREATER THAN 100.4 F (38 ?C) OR HIGHER ?*CHILLS OR SWEATING ?*NAUSEA AND VOMITING THAT IS NOT CONTROLLED WITH YOUR NAUSEA MEDICATION ?*UNUSUAL SHORTNESS OF BREATH ?*UNUSUAL BRUISING OR BLEEDING ?*URINARY PROBLEMS (pain or burning when urinating, or frequent urination) ?*BOWEL PROBLEMS (unusual diarrhea, constipation, pain near the anus) ?TENDERNESS IN MOUTH AND THROAT WITH OR WITHOUT PRESENCE OF ULCERS (sore throat, sores in mouth, or a toothache) ?UNUSUAL RASH, SWELLING OR PAIN  ?UNUSUAL VAGINAL DISCHARGE OR ITCHING  ? ?Items with * indicate a potential emergency and should be followed up as soon as possible or go to the Emergency Department if any problems should occur. ? ?Please show the CHEMOTHERAPY ALERT CARD or IMMUNOTHERAPY ALERT CARD at check-in to the Emergency Department and triage nurse. ? ?Should you have questions after your visit or need to cancel or reschedule your appointment, please contact [facility]  Dept: [phone]  and follow the prompts.  Office hours are 8:00 a.m. to 4:30 p.m. Monday - Friday. Please note that voicemails left after 4:00 p.m. may not be returned until the following business day.  We are closed weekends and major holidays. You have access to a nurse at all times for urgent questions. Please call the main number to the clinic Dept: [phone] and follow the prompts. ? ? ?For any non-urgent questions, you may also contact your provider using MyChart. We now offer e-Visits  for anyone 81 and older to request care online for non-urgent symptoms. For details visit [email or web address]. ?  ?Also download the MyChart app! Go to the app store, search "MyChart", open the app, select [facility], and log in with your MyChart username and password. ? ?Due to Covid, a mask is required upon entering the hospital/clinic. If you do not have a mask, one will be given to you upon arrival. For doctor visits, patients may have 1 support  person aged 39 or older with them. For treatment visits, patients cannot have anyone with them due to current Covid guidelines and our immunocompromised population.  ? ?

## 2021-08-24 ENCOUNTER — Ambulatory Visit (INDEPENDENT_AMBULATORY_CARE_PROVIDER_SITE_OTHER): Payer: Medicare Other

## 2021-08-24 VITALS — BP 122/60 | Temp 97.8°F | Ht 63.0 in | Wt 145.1 lb

## 2021-08-24 DIAGNOSIS — Z0000 Encounter for general adult medical examination without abnormal findings: Secondary | ICD-10-CM | POA: Diagnosis not present

## 2021-08-24 NOTE — Patient Instructions (Addendum)
?[name] , ?Thank you for taking time to come for your Medicare Wellness Visit. I appreciate your ongoing commitment to your health goals. Please review the following plan we discussed and let me know if I can assist you in the future.  ? ?These are the goals we discussed: ? Goals   ? ?   Exercise 3x per week (30 min per time)   ?   No goals at present (pt-stated)   ?   May walk a little when weather gets better. ?  ?   Track and Manage My Blood Pressure-Hypertension   ?   Timeframe:  Short-Term Goal ?Priority:  Medium ?Start Date:                             ?Expected End Date:                      ? ?Follow Up Date 04/06/2021  ?  ?- check blood pressure weekly ?- choose a place to take my blood pressure (home, clinic or office, retail store) ?- write blood pressure results in a log or diary  ?  ?Why is this important?   ?You won't feel high blood pressure, but it can still hurt your blood vessels.  ?High blood pressure can cause heart or kidney problems. It can also cause a stroke.  ?Making lifestyle changes like losing a little weight or eating less salt will help.  ?Checking your blood pressure at home and at different times of the day can help to control blood pressure.  ?If the doctor prescribes medicine remember to take it the way the doctor ordered.  ?Call the office if you cannot afford the medicine or if there are questions about it.   ?  ?Notes:  ?  ? ?  ?  ?This is a list of the screening recommended for you and due dates:  ?Health Maintenance  ?Topic Date Due  ? COVID-19 Vaccine (1) 09/09/2021*  ? Zoster (Shingles) Vaccine (1 of 2) 11/23/2021*  ? Flu Shot  12/11/2021  ? Colon Cancer Screening  02/04/2022  ? Pneumonia Vaccine  Completed  ? Hepatitis C Screening: USPSTF Recommendation to screen - Ages 22-79 yo.  Completed  ? HPV Vaccine  Aged Out  ? Tetanus Vaccine  Discontinued  ?*Topic was postponed. The date shown is not the original due date.  ? ?Opioid Pain Medicine Management ?Opioids are  powerful medicines that are used to treat moderate to severe pain. When used for short periods of time, they can help you to: ?Sleep better. ?Do better in physical or occupational therapy. ?Feel better in the first few days after an injury. ?Recover from surgery. ?Opioids should be taken with the supervision of a trained health care provider. They should be taken for the shortest period of time possible. This is because opioids can be addictive, and the longer you take opioids, the greater your risk of addiction. This addiction can also be called opioid use disorder. ?What are the risks? ?Using opioid pain medicines for longer than 3 days increases your risk of side effects. Side effects include: ?Constipation. ?Nausea and vomiting. ?Breathing difficulties (respiratory depression). ?Drowsiness. ?Confusion. ?Opioid use disorder. ?Itching. ?Taking opioid pain medicine for a long period of time can affect your ability to do daily tasks. It also puts you at risk for: ?Motor vehicle crashes. ?Depression. ?Suicide. ?Heart attack. ?Overdose, which can be life-threatening. ?What is  a pain treatment plan? ?A pain treatment plan is an agreement between you and your health care provider. Pain is unique to each person, and treatments vary depending on your condition. To manage your pain, you and your health care provider need to work together. To help you do this: ?Discuss the goals of your treatment, including how much pain you might expect to have and how you will manage the pain. ?Review the risks and benefits of taking opioid medicines. ?Remember that a good treatment plan uses more than one approach and minimizes the chance of side effects. ?Be honest about the amount of medicines you take and about any drug or alcohol use. ?Get pain medicine prescriptions from only one health care provider. ?Pain can be managed with many types of alternative treatments. Ask your health care provider to refer you to one or more specialists  who can help you manage pain through: ?Physical or occupational therapy. ?Counseling (cognitive behavioral therapy). ?Good nutrition. ?Biofeedback. ?Massage. ?Meditation. ?Non-opioid medicine. ?Following a gentle exercise program. ?How to use opioid pain medicine ?Taking medicine ?Take your pain medicine exactly as told by your health care provider. Take it only when you need it. ?If your pain gets less severe, you may take less than your prescribed dose if your health care provider approves. ?If you are not having pain, do nottake pain medicine unless your health care provider tells you to take it. ?If your pain is severe, do nottry to treat it yourself by taking more pills than instructed on your prescription. Contact your health care provider for help. ?Write down the times when you take your pain medicine. It is easy to become confused while on pain medicine. Writing the time can help you avoid overdose. ?Take other over-the-counter or prescription medicines only as told by your health care provider. ?Keeping yourself and others safe ? ?While you are taking opioid pain medicine: ?Do not drive, use machinery, or power tools. ?Do not sign legal documents. ?Do not drink alcohol. ?Do not take sleeping pills. ?Do not supervise children by yourself. ?Do not do activities that require climbing or being in high places. ?Do not go to a lake, river, ocean, spa, or swimming pool. ?Do not share your pain medicine with anyone. ?Keep pain medicine in a locked cabinet or in a secure area where pets and children cannot reach it. ?Stopping your use of opioids ?If you have been taking opioid medicine for more than a few weeks, you may need to slowly decrease (taper) how much you take until you stop completely. Tapering your use of opioids can decrease your risk of symptoms of withdrawal, such as: ?Pain and cramping in the abdomen. ?Nausea. ?Sweating. ?Sleepiness. ?Restlessness. ?Uncontrollable shaking (tremors). ?Cravings for the  medicine. ?Do not attempt to taper your use of opioids on your own. Talk with your health care provider about how to do this. Your health care provider may prescribe a step-down schedule based on how much medicine you are taking and how long you have been taking it. ?Getting rid of leftover pills ?Do not save any leftover pills. Get rid of leftover pills safely by: ?Taking the medicine to a prescription take-back program. This is usually offered by the county or law enforcement. ?Bringing them to a pharmacy that has a drug disposal container. ?Flushing them down the toilet. Check the label or package insert of your medicine to see whether this is safe to do. ?Throwing them out in the trash. Check the label or package insert of  your medicine to see whether this is safe to do. ?If it is safe to throw it out, remove the medicine from the original container, put it into a sealable bag or container, and mix it with used coffee grounds, food scraps, dirt, or cat litter before putting it in the trash. ?Follow these instructions at home: ?Activity ?Do exercises as told by your health care provider. ?Avoid activities that make your pain worse. ?Return to your normal activities as told by your health care provider. Ask your health care provider what activities are safe for you. ?General instructions ?You may need to take these actions to prevent or treat constipation: ?Drink enough fluid to keep your urine pale yellow. ?Take over-the-counter or prescription medicines. ?Eat foods that are high in fiber, such as beans, whole grains, and fresh fruits and vegetables. ?Limit foods that are high in fat and processed sugars, such as fried or sweet foods. ?Keep all follow-up visits. This is important. ?Where to find support ?If you have been taking opioids for a long time, you may benefit from receiving support for quitting from a local support group or counselor. Ask your health care provider for a referral to these resources in  your area. ?Where to find more information ?Centers for Disease Control and Prevention (CDC): http://www.wolf.info/ ?U.S. Food and Drug Administration (FDA): [place]aming.ch ?Get help right away if: ?You may have taken

## 2021-08-24 NOTE — Progress Notes (Signed)
? ?Subjective:  ? Samuel [name] is a 71 y.o. male who presents for Medicare Annual/Subsequent preventive examination. ? ?Review of Systems    ? ?Cardiac Risk Factors include: advanced age (>12mn, >>40women);hypertension ? ?   ?Objective:  ?  ?Today's Vitals  ? 08/24/21 1101 08/24/21 1109  ?BP: 122/60   ?Temp: 97.8 ?F (36.6 ?C)   ?TempSrc: Oral   ?Weight: 145 lb 1.6 oz (65.8 kg)   ?Height: '5\' 3"'$  (1.6 m)   ?PainSc:  0-No pain  ? ?Body mass index is 25.7 kg/m?. ? ? ?  08/24/2021  ? 11:28 AM 08/01/2021  ? 12:49 PM 07/18/2021  ?  1:36 PM 07/18/2021  ? 12:58 PM 07/11/2021  ? 11:10 AM 07/06/2021  ?  8:26 PM 07/06/2021  ? 11:52 AM  ?Advanced Directives  ?Does Patient Have a Medical Advance Directive? Yes Yes Yes Yes Yes Yes   ?Type of AParamedicof [place]Living will [place] will   [place] will [place] Tele[place]Living will Living will;Healthcare Power of Attorney  ?Does patient want to make changes to medical advance directive? No - Patient declined  No - Patient declined No - Patient declined No - Patient declined No - Patient declined   ?Copy of HPinecrestin Chart? No - copy requested    No - copy requested No - copy requested   ? ? ?Current Medications (verified) ?Outpatient Encounter Medications as of 08/24/2021  ?Medication Sig  ? acetaminophen (TYLENOL) 325 MG tablet Take 325 mg by mouth every 6 (six) hours as needed for moderate pain or fever.  ? albuterol (PROVENTIL) (2.5 MG/3ML) 0.083% nebulizer solution Take 2.5 mg by nebulization in the morning and at bedtime.  ? albuterol (VENTOLIN HFA) 108 (90 Base) MCG/ACT inhaler Inhale 2 puffs into the lungs every 6 (six) hours as needed for wheezing or shortness of breath.  ? amLODipine (NORVASC) 10 MG tablet Take 10 mg by mouth at bedtime.  ? aspirin EC 81 MG tablet Take 1 tablet (81 mg total) by mouth daily. Swallow whole.  ? atorvastatin (LIPITOR) 40 MG  tablet Take 1 tablet (40 mg total) by mouth daily.  ? Budeson-Glycopyrrol-Formoterol (BREZTRI AEROSPHERE) 160-9-4.8 MCG/ACT AERO Inhale 2 puffs into the lungs in the morning and at bedtime.  ? Cholecalciferol (VITAMIN D3 PO) Take 1 tablet by mouth daily.  ? Cholecalciferol (VITAMIN D3) 25 MCG (1000 UT) CAPS   ? citalopram (CELEXA) 20 MG tablet Take 1 tablet (20 mg total) by mouth daily. (Patient taking differently: Take 20 mg by mouth at bedtime.)  ? Cyanocobalamin (B-12 PO) Take 1 capsule by mouth daily.  ? finasteride (PROSCAR) 5 MG tablet Take 5 mg by mouth daily.  ? furosemide (LASIX) 20 MG tablet Take 20 mg by mouth 3 (three) times a week.  ? lidocaine-prilocaine (EMLA) cream Apply 1 application topically as needed.  ? metoprolol succinate (TOPROL-XL) 25 MG 24 hr tablet Take 25 mg by mouth daily.  ? oxyCODONE (OXY IR/ROXICODONE) 5 MG immediate release tablet Take 1 tablet (5 mg total) by mouth every 6 (six) hours as needed for moderate pain or severe pain (Post-operatively).  ? OXYGEN Inhale 2 L into the lungs See admin instructions. 2L when taking a nap and at bedtime  ? prochlorperazine (COMPAZINE) 10 MG tablet TAKE 1 TABLET(10 MG) BY MOUTH EVERY 6 HOURS AS NEEDED FOR NAUSEA OR VOMITING  ? tamsulosin (FLOMAX) 0.4 MG CAPS capsule Take 2  capsules by mouth at bedtime.  ? ?No facility-administered encounter medications on file as of 08/24/2021.  ? ? ?Allergies (verified) ?Penicillins  ? ?History: ?Past Medical History:  ?Diagnosis Date  ? Anemia   ? Anxiety   ? Arthritis   ? Bladder cancer ([place])   ? urologist--- dr Tresa Moore  ? BPH with obstruction/lower urinary tract symptoms   ? CKD (chronic kidney disease), stage III ([place])   ? Congenital deformity of left hand   ? COPD (chronic obstructive pulmonary disease) ([place])   ? followed by pcp  ? Depression   ? Dyspnea   ? Essential hypertension   ? followed by pcp  ? History of adenomatous polyp of colon   ? Hyperlipidemia   ? Renal cysts, acquired, bilateral   ? Wears  glasses   ? ?Past Surgical History:  ?Procedure Laterality Date  ? COLONOSCOPY WITH PROPOFOL  last one 02-04-2017  dr Henrene Pastor  ? CYSTOSCOPY W/ RETROGRADES Bilateral 09/15/2020  ? Procedure: CYSTOSCOPY WITH RETROGRADE PYELOGRAM;  Surgeon: Alexis Frock, MD;  Location: [facility];  Service: Urology;  Laterality: Bilateral;  ? CYSTOSCOPY W/ RETROGRADES Bilateral 05/09/2021  ? Procedure: CYSTOSCOPY;  Surgeon: Alexis Frock, MD;  Location: [facility];  Service: Urology;  Laterality: Bilateral;  ? IR IMAGING GUIDED PORT INSERTION  06/18/2021  ? IR NEPHROSTOMY EXCHANGE LEFT  06/18/2021  ? IR NEPHROSTOMY EXCHANGE LEFT  07/09/2021  ? IR NEPHROSTOMY EXCHANGE LEFT  08/13/2021  ? IR NEPHROSTOMY EXCHANGE RIGHT  06/18/2021  ? IR NEPHROSTOMY EXCHANGE RIGHT  07/09/2021  ? IR NEPHROSTOMY EXCHANGE RIGHT  08/13/2021  ? IR NEPHROSTOMY PLACEMENT LEFT  03/30/2021  ? IR NEPHROSTOMY PLACEMENT RIGHT  04/02/2021  ? TRANSURETHRAL RESECTION OF BLADDER TUMOR N/A 09/15/2020  ? Procedure: BLADDER BIOPSY FULGERATION;  Surgeon: Alexis Frock, MD;  Location: [facility];  Service: Urology;  Laterality: N/A;  45 MINS  ? TRANSURETHRAL RESECTION OF BLADDER TUMOR N/A 05/09/2021  ? Procedure: TRANSURETHRAL RESECTION OF BLADDER TUMOR (TURBT);  Surgeon: Alexis Frock, MD;  Location: [facility];  Service: Urology;  Laterality: N/A;  75 MINS  ? ?Family History  ?Problem Relation Age of Onset  ? Hypertension Mother   ? Stroke Mother   ? Heart disease Father   ? Breast cancer Sister   ? Prostate cancer Brother   ? Hypertension Sister   ? Colon cancer Neg Hx   ? Esophageal cancer Neg Hx   ? Pancreatic cancer Neg Hx   ? Rectal cancer Neg Hx   ? Stomach cancer Neg Hx   ? ?Social History  ? ?Socioeconomic History  ? Marital status: Married  ?  Spouse name: Not on file  ? Number of children: Not on file  ? Years of education: Not on file  ? Highest education level: 12th grade  ?Occupational History  ? Occupation: retired  ?Tobacco Use  ? Smoking  status: Former  ?  Years: 58.00  ?  Types: Cigarettes  ?  Quit date: 01/16/2017  ?  Years since quitting: 4.6  ? Smokeless tobacco: Never  ?Vaping Use  ? Vaping Use: Never used  ?Substance and Sexual Activity  ? Alcohol use: No  ? Drug use: Never  ? Sexual activity: Not on file  ?Other Topics Concern  ? Not on file  ?Social History Narrative  ? Retired   ? Married   ?   ? He watches television  ? ?Social Determinants of Health  ? ?Financial Resource Strain: Medium Risk  ?  Difficulty of Paying Living Expenses: Somewhat hard  ?Food Insecurity: Food Insecurity Present  ? Worried About Charity fundraiser in the Last Year: Sometimes true  ? Ran Out of Food in the Last Year: Sometimes true  ?Transportation Needs: No Transportation Needs  ? Lack of Transportation (Medical): No  ? Lack of Transportation (Non-Medical): No  ?Physical Activity: Insufficiently Active  ? Days of Exercise per Week: 2 days  ? Minutes of Exercise per Session: 10 min  ?Stress: No Stress Concern Present  ? Feeling of Stress : Not at all  ?Social Connections: Moderately Isolated  ? Frequency of Communication with Friends and Family: Once a week  ? Frequency of Social Gatherings with Friends and Family: More than three times a week  ? Attends Religious Services: Never  ? Active Member of Clubs or Organizations: No  ? Attends Archivist Meetings: Never  ? Marital Status: Married  ? ?Clinical Intake: ? ? ?How often do you need to have someone help you when you read instructions, pamphlets, or other written materials from your doctor or pharmacy?: 1 - Never ? ?Diabetic?  No ? ?Activities of Daily Living ? ?  08/24/2021  ? 11:21 AM 08/21/2021  ?  4:33 PM  ?In your present state of health, do you have any difficulty performing the following activities:  ?Hearing? 0 0  ?Vision? 0 0  ?Difficulty concentrating or making decisions? 1 1  ?Comment Followed by PCP   ?Walking or climbing stairs? 0 0  ?Dressing or bathing? 1 1  ?Comment Wife assist   ?Doing  errands, shopping? 1 1  ?Comment Wife assist   ?Preparing Food and eating ? N Y  ?Using the Toilet? N Y  ?In the past six months, have you accidently leaked urine? N Y  ?Comment Wears pads   ?Do you have problem

## 2021-08-29 ENCOUNTER — Inpatient Hospital Stay: Payer: Medicare Other | Admitting: Dietician

## 2021-08-29 ENCOUNTER — Ambulatory Visit: Payer: Medicare Other

## 2021-08-29 ENCOUNTER — Other Ambulatory Visit: Payer: Medicare Other

## 2021-09-11 DIAGNOSIS — J9611 Chronic respiratory failure with hypoxia: Secondary | ICD-10-CM | POA: Diagnosis not present

## 2021-09-12 ENCOUNTER — Inpatient Hospital Stay: Payer: Medicare Other | Admitting: Oncology

## 2021-09-12 ENCOUNTER — Inpatient Hospital Stay: Payer: Medicare Other | Admitting: Dietician

## 2021-09-12 ENCOUNTER — Inpatient Hospital Stay: Payer: Medicare Other | Attending: Oncology

## 2021-09-12 ENCOUNTER — Inpatient Hospital Stay: Payer: Medicare Other

## 2021-09-12 ENCOUNTER — Other Ambulatory Visit: Payer: Self-pay

## 2021-09-12 VITALS — BP 119/68 | HR 98 | Temp 97.8°F | Resp 17 | Ht 63.0 in | Wt 145.7 lb

## 2021-09-12 DIAGNOSIS — Z79899 Other long term (current) drug therapy: Secondary | ICD-10-CM | POA: Diagnosis not present

## 2021-09-12 DIAGNOSIS — C679 Malignant neoplasm of bladder, unspecified: Secondary | ICD-10-CM

## 2021-09-12 DIAGNOSIS — D649 Anemia, unspecified: Secondary | ICD-10-CM | POA: Diagnosis not present

## 2021-09-12 DIAGNOSIS — Z95828 Presence of other vascular implants and grafts: Secondary | ICD-10-CM

## 2021-09-12 DIAGNOSIS — Z5111 Encounter for antineoplastic chemotherapy: Secondary | ICD-10-CM | POA: Diagnosis not present

## 2021-09-12 LAB — CBC WITH DIFFERENTIAL (CANCER CENTER ONLY)
Abs Immature Granulocytes: 0.04 10*3/uL (ref 0.00–0.07)
Basophils Absolute: 0 10*3/uL (ref 0.0–0.1)
Basophils Relative: 0 %
Eosinophils Absolute: 0.4 10*3/uL (ref 0.0–0.5)
Eosinophils Relative: 4 %
HCT: 33.5 % — ABNORMAL LOW (ref 39.0–52.0)
Hemoglobin: 10.8 g/dL — ABNORMAL LOW (ref 13.0–17.0)
Immature Granulocytes: 0 %
Lymphocytes Relative: 8 %
Lymphs Abs: 0.8 10*3/uL (ref 0.7–4.0)
MCH: 28.4 pg (ref 26.0–34.0)
MCHC: 32.2 g/dL (ref 30.0–36.0)
MCV: 88.2 fL (ref 80.0–100.0)
Monocytes Absolute: 0.9 10*3/uL (ref 0.1–1.0)
Monocytes Relative: 9 %
Neutro Abs: 7.5 10*3/uL (ref 1.7–7.7)
Neutrophils Relative %: 79 %
Platelet Count: 269 10*3/uL (ref 150–400)
RBC: 3.8 MIL/uL — ABNORMAL LOW (ref 4.22–5.81)
RDW: 19.8 % — ABNORMAL HIGH (ref 11.5–15.5)
WBC Count: 9.5 10*3/uL (ref 4.0–10.5)
nRBC: 0 % (ref 0.0–0.2)

## 2021-09-12 LAB — CMP (CANCER CENTER ONLY)
ALT: 14 U/L (ref 0–44)
AST: 17 U/L (ref 15–41)
Albumin: 3.5 g/dL (ref 3.5–5.0)
Alkaline Phosphatase: 121 U/L (ref 38–126)
Anion gap: 9 (ref 5–15)
BUN: 40 mg/dL — ABNORMAL HIGH (ref 8–23)
CO2: 23 mmol/L (ref 22–32)
Calcium: 9.1 mg/dL (ref 8.9–10.3)
Chloride: 104 mmol/L (ref 98–111)
Creatinine: 2.03 mg/dL — ABNORMAL HIGH (ref 0.61–1.24)
GFR, Estimated: 34 mL/min — ABNORMAL LOW (ref >60)
Glucose, Bld: 84 mg/dL (ref 70–99)
Potassium: 4.2 mmol/L (ref 3.5–5.1)
Sodium: 136 mmol/L (ref 135–145)
Total Bilirubin: 0.6 mg/dL (ref 0.3–1.2)
Total Protein: 7.5 g/dL (ref 6.5–8.1)

## 2021-09-12 LAB — TSH: TSH: 2.222 u[IU]/mL (ref 0.350–4.500)

## 2021-09-12 MED ORDER — HEPARIN SOD (PORK) LOCK FLUSH 100 UNIT/ML IV SOLN
500.0000 [IU] | Freq: Once | INTRAVENOUS | Status: AC | PRN
  Administered 2021-09-12: 500 [IU]

## 2021-09-12 MED ORDER — SODIUM CHLORIDE 0.9% FLUSH
10.0000 mL | INTRAVENOUS | Status: DC | PRN
  Administered 2021-09-12: 10 mL

## 2021-09-12 MED ORDER — SODIUM CHLORIDE 0.9% FLUSH
10.0000 mL | Freq: Once | INTRAVENOUS | Status: AC
  Administered 2021-09-12: 10 mL

## 2021-09-12 MED ORDER — SODIUM CHLORIDE 0.9 % IV SOLN
200.0000 mg | Freq: Once | INTRAVENOUS | Status: AC
  Administered 2021-09-12: 200 mg via INTRAVENOUS
  Filled 2021-09-12: qty 200

## 2021-09-12 MED ORDER — SODIUM CHLORIDE 0.9 % IV SOLN: Freq: Once | INTRAVENOUS | Status: AC

## 2021-09-12 NOTE — Progress Notes (Signed)
Hematology and Oncology Follow Up Visit ? ?Samuel [name] ?[id number] ?08/27/1950 71 y.o. ?09/12/2021 11:33 AM ?Samuel Furlong, NP  ? ?Principle Diagnosis: 71 year old with bladder cancer diagnosed in December 2022.  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 ? ?Prior Therapy: ? ?He is status post BCG treatment and June 2022. ?Carboplatin and gemcitabine started on June 20, 2021.   He completed 3 cycles of therapy in March 2023. ? ? ?Current therapy: Pembrolizumab 200 mg every 3 weeks started on August 22, 2021.  He is here for the second cycle of therapy. ? ?Interim History: Samuel [name] returns today for repeat evaluation.  Since last visit, he tolerated Pembrolizumab without any major complaints.  He denies any nausea, vomiting or abdominal pain.  He denies any recent hospitalizations or illnesses.  He reported more energy and increased exercise tolerance.  His performance status and quality of life remain excellent. ? ? ? ? ?Medications: Updated on review. ?Current Outpatient Medications  ?Medication Sig Dispense Refill  ? acetaminophen (TYLENOL) 325 MG tablet Take 325 mg by mouth every 6 (six) hours as needed for moderate pain or fever.    ? albuterol (PROVENTIL) (2.5 MG/3ML) 0.083% nebulizer solution Take 2.5 mg by nebulization in the morning and at bedtime.    ? albuterol (VENTOLIN HFA) 108 (90 Base) MCG/ACT inhaler Inhale 2 puffs into the lungs every 6 (six) hours as needed for wheezing or shortness of breath. 8 g 6  ? amLODipine (NORVASC) 10 MG tablet Take 10 mg by mouth at bedtime.    ? aspirin EC 81 MG tablet Take 1 tablet (81 mg total) by mouth daily. Swallow whole. 90 tablet 3  ? atorvastatin (LIPITOR) 40 MG tablet Take 1 tablet (40 mg total) by mouth daily. 90 tablet 3  ? Budeson-Glycopyrrol-Formoterol (BREZTRI AEROSPHERE) 160-9-4.8 MCG/ACT AERO Inhale 2 puffs into the lungs in the morning and at bedtime. 11.8 g 11  ? Cholecalciferol  (VITAMIN D3 PO) Take 1 tablet by mouth daily.    ? Cholecalciferol (VITAMIN D3) 25 MCG (1000 UT) CAPS     ? citalopram (CELEXA) 20 MG tablet Take 1 tablet (20 mg total) by mouth daily. (Patient taking differently: Take 20 mg by mouth at bedtime.) 90 tablet 1  ? Cyanocobalamin (B-12 PO) Take 1 capsule by mouth daily.    ? finasteride (PROSCAR) 5 MG tablet Take 5 mg by mouth daily.    ? furosemide (LASIX) 20 MG tablet Take 20 mg by mouth 3 (three) times a week.    ? lidocaine-prilocaine (EMLA) cream Apply 1 application topically as needed. 30 g 0  ? metoprolol succinate (TOPROL-XL) 25 MG 24 hr tablet Take 25 mg by mouth daily.    ? oxyCODONE (OXY IR/ROXICODONE) 5 MG immediate release tablet Take 1 tablet (5 mg total) by mouth every 6 (six) hours as needed for moderate pain or severe pain (Post-operatively). 25 tablet 0  ? OXYGEN Inhale 2 L into the lungs See admin instructions. 2L when taking a nap and at bedtime    ? prochlorperazine (COMPAZINE) 10 MG tablet TAKE 1 TABLET(10 MG) BY MOUTH EVERY 6 HOURS AS NEEDED FOR NAUSEA OR VOMITING 30 tablet 0  ? tamsulosin (FLOMAX) 0.4 MG CAPS capsule Take 2 capsules by mouth at bedtime.    ? ?No current facility-administered medications for this visit.  ? ? ? ?Allergies:  ?Allergies  ?Allergen Reactions  ? Penicillins Diarrhea and Nausea And Vomiting  ?  Did  it involve swelling of the face/tongue/throat, SOB, or low BP? No ?Did it involve sudden or severe rash/hives, skin peeling, or any reaction on the inside of your mouth or nose?  ?Did you need to seek medical attention at a hospital or doctor's office? No ?When did it last happen?    Several Years Ago   ?If all above answers are "NO", may proceed with cephalosporin use. ? ?  ? ? ? ? ?Physical Exam: ? ?Blood pressure 119/68, pulse 98, temperature 97.8 ?F (36.6 ?C), temperature source Axillary, resp. rate 17, height '5\' 3"'$  (1.6 m), weight 145 lb 11.2 oz (66.1 kg), SpO2 95 %. ? ? ?ECOG: 1 ? ? ?General appearance: Alert, awake  without any distress. ?Head: Atraumatic without abnormalities ?Oropharynx: Without any thrush or ulcers. ?Eyes: No scleral icterus. ?Lymph nodes: No lymphadenopathy noted in the cervical, supraclavicular, or axillary nodes ?Heart:regular rate and rhythm, without any murmurs or gallops.   ?Lung: Clear to auscultation without any rhonchi, wheezes or dullness to percussion. ?Abdomin: Soft, nontender without any shifting dullness or ascites. ?Musculoskeletal: No clubbing or cyanosis. ?Neurological: No motor or sensory deficits. ?Skin: No rashes or lesions. ? ? ? ? ?Lab Results: ?Lab Results  ?Component Value Date  ? WBC 9.5 09/12/2021  ? HGB 10.8 (L) 09/12/2021  ? HCT 33.5 (L) 09/12/2021  ? MCV 88.2 09/12/2021  ? PLT 269 09/12/2021  ? ?  Chemistry   ?   ?Component Value Date/Time  ? NA 137 08/22/2021 0801  ? K 4.2 08/22/2021 0801  ? CL 104 08/22/2021 0801  ? CO2 24 08/22/2021 0801  ? BUN 34 (H) 08/22/2021 0801  ? CREATININE 1.96 (H) 08/22/2021 0801  ? CREATININE 2.14 (H) 04/04/2020 0748  ?    ?Component Value Date/Time  ? CALCIUM 9.5 08/22/2021 0801  ? ALKPHOS 97 08/22/2021 0801  ? AST 17 08/22/2021 0801  ? ALT 12 08/22/2021 0801  ? BILITOT 0.3 08/22/2021 0801  ?  ? ? ? ? ? ?Impression and Plan: ? ? ?71 year old with: ?  ?1.  Bladder cancer diagnosed in December 2022.  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 ?  ?He is currently on salvage pembrolizumab without any major complications.  Risks and benefits of continuing this treatment were discussed at this time.  Autoimmune complications, dermatological issues among others were reviewed.  He is agreeable to proceed and the plan is to update staging scan in the next 3 months. ? ?  ?  ?2.  IV access: Port-A-Cath currently in use without any issues. ?  ?3.  Antiemetics: No nausea or vomiting reported at this time. ?  ?4.  Chronic Kidney disease: Creatinine clearance continues to be stable. ?  ?5.  Goals of care: Therapy remains  palliative though aggressive measures are warranted at this time. ? ?6.  Anemia: His hemoglobin improved after transfusion remained stable.  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 ? ?7.  Autoimmune complications: I continue to educate him about these issues.  These include pneumonitis, colitis and thyroid disease.  He has not experienced any at this time. ?  ?8.  Follow-up: He will return in 3 weeks for a follow-up visit. ?  ?30  minutes were dedicated to this visit.  The time was spent on reviewing laboratory data, disease status update, treatment choices and future plan of care review. ?  ? ? ?Zola Button, MD ?5/3/202311:33 AM ? ?

## 2021-09-12 NOTE — Patient Instructions (Signed)
[facility]  Discharge Instructions: ?Thank you for choosing [facility] to provide your oncology and hematology care.  ? ?If you have a lab appointment with the Cancer Center, please go directly to the Cancer Center and check in at the registration area. ?  ?Wear comfortable clothing and clothing appropriate for easy access to any Portacath or PICC line.  ? ?We strive to give you quality time with your provider. You may need to reschedule your appointment if you arrive late (15 or more minutes).  Arriving late affects you and other patients whose appointments are after yours.  Also, if you miss three or more appointments without notifying the office, you may be dismissed from the clinic at the provider?s discretion.    ?  ?For prescription refill requests, have your pharmacy contact our office and allow 72 hours for refills to be completed.   ? ?Today you received the following chemotherapy and/or immunotherapy agents: Keytruda ?  ?To help prevent nausea and vomiting after your treatment, we encourage you to take your nausea medication as directed. ? ?BELOW ARE SYMPTOMS THAT SHOULD BE REPORTED IMMEDIATELY: ?*FEVER GREATER THAN 100.4 F (38 ?C) OR HIGHER ?*CHILLS OR SWEATING ?*NAUSEA AND VOMITING THAT IS NOT CONTROLLED WITH YOUR NAUSEA MEDICATION ?*UNUSUAL SHORTNESS OF BREATH ?*UNUSUAL BRUISING OR BLEEDING ?*URINARY PROBLEMS (pain or burning when urinating, or frequent urination) ?*BOWEL PROBLEMS (unusual diarrhea, constipation, pain near the anus) ?TENDERNESS IN MOUTH AND THROAT WITH OR WITHOUT PRESENCE OF ULCERS (sore throat, sores in mouth, or a toothache) ?UNUSUAL RASH, SWELLING OR PAIN  ?UNUSUAL VAGINAL DISCHARGE OR ITCHING  ? ?Items with * indicate a potential emergency and should be followed up as soon as possible or go to the Emergency Department if any problems should occur. ? ?Please show the CHEMOTHERAPY ALERT CARD or IMMUNOTHERAPY ALERT CARD at check-in to the  Emergency Department and triage nurse. ? ?Should you have questions after your visit or need to cancel or reschedule your appointment, please contact [facility]  Dept: [phone]  and follow the prompts.  Office hours are 8:00 a.m. to 4:30 p.m. Monday - Friday. Please note that voicemails left after 4:00 p.m. may not be returned until the following business day.  We are closed weekends and major holidays. You have access to a nurse at all times for urgent questions. Please call the main number to the clinic Dept: [phone] and follow the prompts. ? ? ?For any non-urgent questions, you may also contact your provider using MyChart. We now offer e-Visits for anyone 18 and older to request care online for non-urgent symptoms. For details visit [email or web address]. ?  ?Also download the MyChart app! Go to the app store, search "MyChart", open the app, select [facility], and log in with your MyChart username and password. ? ?Due to Covid, a mask is required upon entering the hospital/clinic. If you do not have a mask, one will be given to you upon arrival. For doctor visits, patients may have 1 support person aged 18 or older with them. For treatment visits, patients cannot have anyone with them due to current Covid guidelines and our immunocompromised population.  ? ?

## 2021-09-12 NOTE — Progress Notes (Signed)
Per Dr. Alen Blew ok to treat today with creatinine of 2.03 mg/dL ?

## 2021-09-12 NOTE — Progress Notes (Signed)
Nutrition Follow-up: ? ?Patient with stage IV bladder cancer. He completed 3 cycles carboplatin/gemcitabine in March 2023. Patient is currently receiving Keytruda q21d.  ? ?Met with patient and wife in office following immunotherapy infusion. Patient reports good appetite. He is eating 2-3 meals and supplementing with Boost occasionally. Yesterday he drank CIB for breakfast, hamburger with ketchup for lunch, pot roast for dinner. He snacked on chocolate chip bar and chips. Patient is drinking ~1 cup of water. He is drinking kool-aid lemonade. Patient reports improved energy. He has been going for walks. Patient denies nutrition impact symptoms.  ? ? ?Medications: reviewed ? ?Labs: BUN 40, Cr 2.03 ? ?Anthropometrics: Weight 145 lb 11.2 oz today stable  ? ?4/14 - 145 lb 1.6 oz  ?3/8 - 145 lb 12.8 oz ? ? ? ?NUTRITION DIAGNOSIS: Food and nutrition related knowledge deficit improved  ? ? ? ?INTERVENTION:  ?Continue eating high calorie high protein foods for weight maintenance  ?Continue drinking Boost as needed with decreased appetite  ?Discussed strategies for increasing intake of water, pt will work on this  ?  ? ?MONITORING, EVALUATION, GOAL: weight trends, intake ? ? ?NEXT VISIT: To be scheduled as needed  ? ? ? ?

## 2021-09-18 ENCOUNTER — Other Ambulatory Visit: Payer: Self-pay | Admitting: Adult Health

## 2021-09-18 MED ORDER — CITALOPRAM HYDROBROMIDE 20 MG PO TABS: 20.0000 mg | ORAL_TABLET | Freq: Every day | ORAL | 1 refills | Status: DC

## 2021-09-24 ENCOUNTER — Telehealth: Payer: Self-pay | Admitting: Pharmacist

## 2021-09-24 NOTE — Chronic Care Management (AMB) (Signed)
    Chronic Care Management Pharmacy Assistant   Name: Anhad Sheeley  MRN: [id number] DOB: 1950/07/15    09/24/21  APPOINTMENT REMINDER    Patient was reminded to have all medications, supplements and any blood glucose and blood pressure readings available for review with [name], Pharm. D, for office visit on 09/25/21 at 9.    Care Gaps: COVID Booster - Overdue BP- 113/70 ( 08/22/21) AWV- 4/23  Star Rating Drug: Atorvastatin (Lipitor) 40 mg - Last filled 07/08/21 90 DS at [facility]     Medications: Outpatient Encounter Medications as of 09/24/2021  Medication Sig Note   acetaminophen (TYLENOL) 325 MG tablet Take 325 mg by mouth every 6 (six) hours as needed for moderate pain or fever.    albuterol (PROVENTIL) (2.5 MG/3ML) 0.083% nebulizer solution Take 2.5 mg by nebulization in the morning and at bedtime.    albuterol (VENTOLIN HFA) 108 (90 Base) MCG/ACT inhaler Inhale 2 puffs into the lungs every 6 (six) hours as needed for wheezing or shortness of breath.    amLODipine (NORVASC) 10 MG tablet Take 10 mg by mouth at bedtime.    aspirin EC 81 MG tablet Take 1 tablet (81 mg total) by mouth daily. Swallow whole.    atorvastatin (LIPITOR) 40 MG tablet Take 1 tablet (40 mg total) by mouth daily.    Budeson-Glycopyrrol-Formoterol (BREZTRI AEROSPHERE) 160-9-4.8 MCG/ACT AERO Inhale 2 puffs into the lungs in the morning and at bedtime.    Cholecalciferol (VITAMIN D3 PO) Take 1 tablet by mouth daily.    Cholecalciferol (VITAMIN D3) 25 MCG (1000 UT) CAPS     citalopram (CELEXA) 20 MG tablet Take 1 tablet (20 mg total) by mouth at bedtime.    Cyanocobalamin (B-12 PO) Take 1 capsule by mouth daily.    finasteride (PROSCAR) 5 MG tablet Take 5 mg by mouth daily.    furosemide (LASIX) 20 MG tablet Take 20 mg by mouth 3 (three) times a week.    lidocaine-prilocaine (EMLA) cream Apply 1 application topically as needed. 07/06/2021: Hasn't started.   metoprolol succinate  (TOPROL-XL) 25 MG 24 hr tablet Take 25 mg by mouth daily.    oxyCODONE (OXY IR/ROXICODONE) 5 MG immediate release tablet Take 1 tablet (5 mg total) by mouth every 6 (six) hours as needed for moderate pain or severe pain (Post-operatively).    OXYGEN Inhale 2 L into the lungs See admin instructions. 2L when taking a nap and at bedtime    prochlorperazine (COMPAZINE) 10 MG tablet TAKE 1 TABLET(10 MG) BY MOUTH EVERY 6 HOURS AS NEEDED FOR NAUSEA OR VOMITING    tamsulosin (FLOMAX) 0.4 MG CAPS capsule Take 2 capsules by mouth at bedtime.    No facility-administered encounter medications on file as of 09/24/2021.      [facility] Clinical Pharmacist Assistant [phone]

## 2021-09-25 ENCOUNTER — Other Ambulatory Visit: Payer: Self-pay | Admitting: *Deleted

## 2021-09-25 ENCOUNTER — Ambulatory Visit (INDEPENDENT_AMBULATORY_CARE_PROVIDER_SITE_OTHER): Payer: Medicare Other | Admitting: Pharmacist

## 2021-09-25 ENCOUNTER — Other Ambulatory Visit: Payer: Self-pay | Admitting: Adult Health

## 2021-09-25 VITALS — BP 118/70

## 2021-09-25 DIAGNOSIS — E782 Mixed hyperlipidemia: Secondary | ICD-10-CM

## 2021-09-25 DIAGNOSIS — Z87891 Personal history of nicotine dependence: Secondary | ICD-10-CM

## 2021-09-25 DIAGNOSIS — I10 Essential (primary) hypertension: Secondary | ICD-10-CM

## 2021-09-25 DIAGNOSIS — Z122 Encounter for screening for malignant neoplasm of respiratory organs: Secondary | ICD-10-CM

## 2021-09-25 DIAGNOSIS — J449 Chronic obstructive pulmonary disease, unspecified: Secondary | ICD-10-CM

## 2021-09-25 NOTE — Patient Instructions (Addendum)
Hi Samuel [name], ? ?It was great to catch up with you again! ? ?Look into one of those water bottles that we talked about or adding some crystal lite lemonade packets to your water to help you stay hydrated. ? ?Please reach out to me if you have any questions or need anything! ? ?Best, ?Maddie ? ?[name], PharmD, BCACP ?Clinical Pharmacist ?Therapist, [facility] ?[phone] ? ? Visit Information ? ? Goals Addressed   ?None ?  ? ?Patient Care Plan: [place]  ?  ? ?Problem Identified: Problem: Hypertension, Hyperlipidemia, COPD, Depression, and BPH   ?  ? ?Long-Range Goal: Patient-Specific Goal   ?Start Date: 09/28/2020  ?Expected End Date: 09/28/2021  ?Recent Progress: On track  ?Priority: High  ?Note:   ?Current Barriers:  ?Unable to independently afford treatment regimen ?Unable to independently monitor therapeutic efficacy ? ?Pharmacist Clinical Goal(s):  ?Patient will verbalize ability to afford treatment regimen ?achieve adherence to monitoring guidelines and medication adherence to achieve therapeutic efficacy through collaboration with PharmD and provider.  ? ?Interventions: ?1:1 collaboration with Samuel Peng, NP regarding development and update of comprehensive plan of care as evidenced by provider attestation and co-signature ?Inter-disciplinary care team collaboration (see longitudinal plan of care) ?Comprehensive medication review performed; medication list updated in electronic medical record ? ?Hypertension (BP goal <140/90) ?-Controlled ?-Current treatment: ?Amlodipine 10 mg 1 tablet daily (AM) - Appropriate, Effective, Safe, Accessible ?Metoprolol succinate 25 mg 1 tablet daily (AM) - Appropriate, Effective, Safe, Accessible ?-Medications previously tried: none  ?-Current home readings: not checking much at home (in office) ?-Current dietary habits: uses salt to season ?-Current exercise habits: does not have any interest ?-Denies hypotensive/hypertensive  symptoms ?-Educated on Daily salt intake goal < 2300 mg; ?Exercise goal of 150 minutes per week; ?Importance of home blood pressure monitoring; ?Proper BP monitoring technique; ?-Counseled to monitor BP at home weekly, document, and provide log at future appointments ?-Counseled on diet and exercise extensively ?Recommended to continue current medication ? ?Hyperlipidemia: (LDL goal < 100) ?-Uncontrolled ?-Current treatment: ?Atorvastatin 40 mg 1 tablet daily - Appropriate, Query effective, Safe, Accessible ?-Medications previously tried: none  ?-Current dietary patterns: does not eat many fruits and vegetables ?-Current exercise habits: not interested ?-Educated on Cholesterol goals;  ?Benefits of statin for ASCVD risk reduction; ?Importance of limiting foods high in cholesterol; ?Exercise goal of 150 minutes per week; ?-Counseled on diet and exercise extensively ?Recommended to continue current medication ?Recommended repeat lipid panel ? ?COPD (Goal: control symptoms and prevent exacerbations) ?-Not ideally controlled ?-Current treatment  ?Breztri 2 puffs twice daily - Appropriate, Effective, Safe, Accessible ?Albuterol HFA as needed - Appropriate, Effective, Safe, Accessible ?Albuterol nebulizer as needed - Appropriate, Effective, Safe, Accessible ?-Medications previously tried: Librarian, academic (cost)  ?-Gold Grade: n/a ?-Current COPD Classification:  B (high sx, <2 exacerbations/yr) ?-MMRC/CAT score: n/a ?-Pulmonary function testing: none ?-Exacerbations requiring treatment in last 6 months: none ?-Patient reports consistent use of maintenance inhaler ?-Frequency of rescue inhaler use: 2-3 times daily ?-Counseled on Proper inhaler technique; ?Benefits of consistent maintenance inhaler use ?When to use rescue inhaler ?-Recommended follow up with pulmonary. ?Assessed patient finances. Filled out patient assistance paperwork for [employer]. ? ?Depression/Anxiety (Goal: minimize symptoms) ?-Controlled ?-Current  treatment: ?Citalopram 20 mg 1 tablet daily - Appropriate, Effective, Safe, Accessible ?-Medications previously tried/failed: Bupropion ?-PHQ9: 0 ?-GAD7: n/a ?-Educated on Benefits of medication for symptom control ?-Recommended to continue current medication ? ?BPH (Goal: minimize symptoms) ?-Uncontrolled ?-Current treatment  ?tamsulosin 0.4 mg  2 capsules daily - Appropriate, Effective, Safe, Accessible ?Finasteride 5 mg 1 tablet daily - Appropriate, Effective, Safe, Accessible ?-Medications previously tried: none  ?-Recommended to follow up with Dr. Tresa Moore for continued symptoms ? ?Health Maintenance ?-Vaccine gaps: shingles, COVID, shingles ?-Current therapy:  ?Vitamin B12 500 mcg 1 tablet daily ?Vitamin D 1000 units 1 capsule daily ?Tylenol as needed ?-Educated on Cost vs benefit of each product must be carefully weighed by individual consumer ?-Patient is satisfied with current therapy and denies issues ?-Recommended to continue current medication ? ?Patient Goals/Self-Care Activities ?Patient will:  ?- take medications as prescribed ?check blood pressure weekly, document, and provide at future appointments ?target a minimum of 150 minutes of moderate intensity exercise weekly ?engage in dietary modifications by limiting salt intake ? ?Follow Up Plan: The care management team will reach out to the patient again over the next 180 days.   ? ?  ?  ? ?Patient verbalizes understanding of instructions and care plan provided today and agrees to view in [facility]. Active MyChart status confirmed with patient.   ?The pharmacy team will reach out to the patient again over the next 180 days.  ? ?Viona Gilmore, RPH  ?

## 2021-09-25 NOTE — Progress Notes (Signed)
? ?Chronic Care Management ?Pharmacy Note ? ?09/25/2021 ?Name:  [name] MRN:  [id number] DOB:  13-Oct-1950 ? ?Summary: ?BP is at goal < 140/90 per office readings ?  ?Recommendations/Changes made from today's visit: ?-Recommend continued BP monitoring at home ?-Recommended increasing water intake ?  ?Plan: ?Follow up to resubmit rx for PAP at the end of the year ?General assessment in 6 months ? ? ?Subjective: ?Sam[name] is an 71 y.o. year old male who is a primary patient of [name], [name], NP.  The CCM team was consulted for assistance with disease management and care coordination needs.   ? ?Engaged with patient face to face for follow up visit in response to provider referral for pharmacy case management and/or care coordination services.  ? ?Consent to Services:  ?The patient was given information about Chronic Care Management services, agreed to services, and gave verbal consent prior to initiation of services.  Please see initial visit note for detailed documentation.  ? ?Patient Care Team: ?Dorothyann Peng, NP as PCP - General (Family Medicine) ?[place], [place] (Nephrology) ?Viona Gilmore, [facility] as Pharmacist (Pharmacist) ? ?Recent office visits: ?08/24/21 Rolene Arbour, LPN: Patient presented for AWV. ? ?07/19/21 Dorothyann Peng, NP: Patient presented for fatigue. ?  ?10-04-2020 - Patient presented for Anxiety. Stopped Bupropion and Prescribed Citalopram. ? ?Recent consult visits: ?09/12/21 [name], RD (oncology): Patient presented for nutrition visit. ? ?09/12/21 Zola Button, MD (oncology): Patient presented for bladder cancer follow up and Keytruda infusion. ? ?08/22/21 Zola Button, MD (oncology): Patient presented for bladder cancer follow up. Plan for pembrolizumab. ? ?08/01/21 Zola Button, MD (oncology): Patient presented for bladder cancer follow up. Plan to complete cycle 3 of gemcitabine and carboplatin and then switch to immunotherapy. ? ?07/31/21 Freda Jackson, MD  (pulmonary): Patient presented for COPD follow up.  Follow up in 6 months. ? ?07/11/21 Zola Button, MD (oncology): Patient presented for bladder cancer follow up. Follow up in 1 week to complete cycle 2 of gemcitabine and carboplatin. ? ?12/08/20 Harold Barban (urology): Patient presented for bladder chemotherapy. Unable to access notes. ? ? ?Hospital visits: ?Admitted to the hospital on 07/06/21 due to UTI. Discharge date was 07/09/21. Discharged from [facility].   ?  ?New?Medications Started at [facility] Discharge:?? ?-started the following medications ?None ?Medication Changes at Hospital Discharge: ?None ? ? ?Objective: ? ?Lab Results  ?Component Value Date  ? CREATININE 2.03 (H) 09/12/2021  ? BUN 40 (H) 09/12/2021  ? GFR 23.34 (L) 04/10/2021  ? GFRNONAA 34 (L) 09/12/2021  ? GFRAA 35 (L) 04/04/2020  ? NA 136 09/12/2021  ? K 4.2 09/12/2021  ? CALCIUM 9.1 09/12/2021  ? CO2 23 09/12/2021  ? GLUCOSE 84 09/12/2021  ? ? ?Lab Results  ?Component Value Date/Time  ? HGBA1C 5.9 (H) 04/04/2020 07:48 AM  ? HGBA1C 6.2 12/19/2017 07:49 AM  ? GFR 23.34 (L) 04/10/2021 04:11 PM  ? GFR 9.02 (LL) 03/29/2021 11:42 AM  ?  ?Last diabetic Eye exam: No results found for: HMDIABEYEEXA  ?Last diabetic Foot exam: No results found for: HMDIABFOOTEX  ? ?Lab Results  ?Component Value Date  ? CHOL 208 (H) 04/04/2020  ? HDL 58 04/04/2020  ? LDLCALC 129 (H) 04/04/2020  ? TRIG 99 04/04/2020  ? CHOLHDL 3.6 04/04/2020  ? ? ? ?  Latest Ref Rng & Units 09/12/2021  ? 11:15 AM 08/22/2021  ?  8:01 AM 08/08/2021  ? 12:13 PM  ?Hepatic Function  ?Total Protein 6.5 - 8.1 g/dL  7.5   7.5   6.6    ?Albumin 3.5 - 5.0 g/dL 3.5   3.6   3.2    ?AST 15 - 41 U/L _0 ?ALT 0 - 44 U/L _1 ?Alk Phosphatase 38 - 126 U/L 121   97   87    ?Total Bilirubin 0.3 - 1.2 mg/dL 0.6   0.3   0.3    ? ? ?Lab Results  ?Component Value Date/Time  ? TSH 2.222 09/12/2021 11:15 AM  ? TSH 2.526 08/22/2021 08:01 AM  ? TSH 1.89 04/04/2020 07:48 AM  ? TSH  1.93 02/19/2019 07:21 AM  ? ? ? ?  Latest Ref Rng & Units 09/12/2021  ? 11:15 AM 08/22/2021  ?  8:01 AM 08/08/2021  ? 12:13 PM  ?CBC  ?WBC 4.0 - 10.5 K/uL 9.5   6.3   4.5    ?Hemoglobin 13.0 - 17.0 g/dL 10.8   10.7   6.8    ?Hematocrit 39.0 - 52.0 % 33.5   32.7   21.2    ?Platelets 150 - 400 K/uL 269   397   415    ? ? ?Lab Results  ?Component Value Date/Time  ? VD25OH 31.48 05/07/2017 01:43 PM  ? ? ?Clinical ASCVD: No  ?The 10-year ASCVD risk score (Arnett DK, et al., 2019) is: 18.7% ?  Values used to calculate the score: ?    Age: 37 years ?    Sex: Male ?    Is Non-Hispanic African American: No ?    Diabetic: No ?    Tobacco smoker: No ?    Systolic Blood Pressure: 503 mmHg ?    Is BP treated: Yes ?    HDL Cholesterol: 58 mg/dL ?    Total Cholesterol: 208 mg/dL   ? ? ?  08/24/2021  ? 11:15 AM 04/11/2021  ?  8:12 AM 08/16/2020  ?  8:39 AM  ?Depression screen PHQ 2/9  ?Decreased Interest 0 2 0  ?Down, Depressed, Hopeless 0 0 0  ?PHQ - 2 Score 0 2 0  ?Altered sleeping  3   ?Tired, decreased energy  3   ?Change in appetite  3   ?Feeling bad or failure about yourself   0   ?Trouble concentrating  2   ?Moving slowly or fidgety/restless  1   ?Suicidal thoughts  0   ?PHQ-9 Score  14   ?Difficult doing work/chores  Not difficult at all   ?  ? ?mMRC Dyspnea Scale mMRC Score  ?04/30/2021 ? 9:23 AM 2  ? ? ? ?   ? View : No data to display.  ?  ?  ?  ? ? ? ? ?Social History  ? ?Tobacco Use  ?Smoking Status Former  ? Years: 58.00  ? Types: Cigarettes  ? Quit date: 01/16/2017  ? Years since quitting: 4.6  ?Smokeless Tobacco Never  ? ?BP Readings from Last 3 Encounters:  ?09/25/21 118/70  ?09/12/21 119/68  ?08/24/21 122/60  ? ?Pulse Readings from Last 3 Encounters:  ?09/12/21 98  ?08/22/21 85  ?08/09/21 68  ? ?Wt Readings from Last 3 Encounters:  ?09/12/21 145 lb 11.2 oz (66.1 kg)  ?08/24/21 145 lb 1.6 oz (65.8 kg)  ?08/22/21 143 lb 4.8 oz (65 kg)  ? ?BMI Readings from Last 3 Encounters:  ?09/12/21 25.81 kg/m?  ?08/24/21 25.70 kg/m?   ?08/22/21 25.38 kg/m?  ? ? ?  Assessment/Interventions: Review of patient past medical history, allergies, medications, health status, including review of consultants reports, laboratory and other test data, was performed as part of comprehensive evaluation and provision of chronic care management services.  ? ?SDOH:  (Social Determinants of Health) assessments and interventions performed: Yes ? ? ?SDOH Screenings  ? ?Alcohol Screen: Low Risk   ? Last Alcohol Screening Score (AUDIT): 0  ?Depression (PHQ2-9): Low Risk   ? PHQ-2 Score: 0  ?Financial Resource Strain: Medium Risk  ? Difficulty of Paying Living Expenses: Somewhat hard  ?Food Insecurity: Food Insecurity Present  ? Worried About Charity fundraiser in the Last Year: Sometimes true  ? Ran Out of Food in the Last Year: Sometimes true  ?Housing: Low Risk   ? Last Housing Risk Score: 0  ?Physical Activity: Insufficiently Active  ? Days of Exercise per Week: 2 days  ? Minutes of Exercise per Session: 10 min  ?Social Connections: Moderately Isolated  ? Frequency of Communication with Friends and Family: Once a week  ? Frequency of Social Gatherings with Friends and Family: More than three times a week  ? Attends Religious Services: Never  ? Active Member of Clubs or Organizations: No  ? Attends Archivist Meetings: Never  ? Marital Status: Married  ?Stress: No Stress Concern Present  ? Feeling of Stress : Not at all  ?Tobacco Use: Medium Risk  ? Smoking Tobacco Use: Former  ? Smokeless Tobacco Use: Never  ? Passive Exposure: Not on file  ?Transportation Needs: No Transportation Needs  ? Lack of Transportation (Medical): No  ? Lack of Transportation (Non-Medical): No  ? ? ?CCM Care Plan ? ?Allergies  ?Allergen Reactions  ? Penicillins Diarrhea and Nausea And Vomiting  ?  Did it involve swelling of the face/tongue/throat, SOB, or low BP? No ?Did it involve sudden or severe rash/hives, skin peeling, or any reaction on the inside of your mouth or nose?   ?Did you need to seek medical attention at a hospital or doctor's office? No ?When did it last happen?    Several Years Ago   ?If all above answers are "NO", may proceed with cephalosporin use. ? ?  ? ? ?Medicat

## 2021-09-27 ENCOUNTER — Other Ambulatory Visit: Payer: Medicare Other

## 2021-10-03 ENCOUNTER — Other Ambulatory Visit: Payer: Self-pay

## 2021-10-03 ENCOUNTER — Inpatient Hospital Stay: Payer: Medicare Other

## 2021-10-03 ENCOUNTER — Inpatient Hospital Stay (HOSPITAL_BASED_OUTPATIENT_CLINIC_OR_DEPARTMENT_OTHER): Payer: Medicare Other | Admitting: Oncology

## 2021-10-03 VITALS — BP 117/70 | HR 78 | Temp 97.1°F | Resp 16 | Wt 144.9 lb

## 2021-10-03 DIAGNOSIS — Z95828 Presence of other vascular implants and grafts: Secondary | ICD-10-CM

## 2021-10-03 DIAGNOSIS — C679 Malignant neoplasm of bladder, unspecified: Secondary | ICD-10-CM

## 2021-10-03 DIAGNOSIS — Z5111 Encounter for antineoplastic chemotherapy: Secondary | ICD-10-CM | POA: Diagnosis not present

## 2021-10-03 DIAGNOSIS — D649 Anemia, unspecified: Secondary | ICD-10-CM | POA: Diagnosis not present

## 2021-10-03 DIAGNOSIS — Z79899 Other long term (current) drug therapy: Secondary | ICD-10-CM | POA: Diagnosis not present

## 2021-10-03 LAB — CBC WITH DIFFERENTIAL (CANCER CENTER ONLY)
Abs Immature Granulocytes: 0.02 10*3/uL (ref 0.00–0.07)
Basophils Absolute: 0 10*3/uL (ref 0.0–0.1)
Basophils Relative: 0 %
Eosinophils Absolute: 0.1 10*3/uL (ref 0.0–0.5)
Eosinophils Relative: 2 %
HCT: 33.6 % — ABNORMAL LOW (ref 39.0–52.0)
Hemoglobin: 11 g/dL — ABNORMAL LOW (ref 13.0–17.0)
Immature Granulocytes: 0 %
Lymphocytes Relative: 9 %
Lymphs Abs: 0.6 10*3/uL — ABNORMAL LOW (ref 0.7–4.0)
MCH: 29.1 pg (ref 26.0–34.0)
MCHC: 32.7 g/dL (ref 30.0–36.0)
MCV: 88.9 fL (ref 80.0–100.0)
Monocytes Absolute: 0.6 10*3/uL (ref 0.1–1.0)
Monocytes Relative: 9 %
Neutro Abs: 5.6 10*3/uL (ref 1.7–7.7)
Neutrophils Relative %: 80 %
Platelet Count: 366 10*3/uL (ref 150–400)
RBC: 3.78 MIL/uL — ABNORMAL LOW (ref 4.22–5.81)
RDW: 17.4 % — ABNORMAL HIGH (ref 11.5–15.5)
WBC Count: 7 10*3/uL (ref 4.0–10.5)
nRBC: 0 % (ref 0.0–0.2)

## 2021-10-03 LAB — TSH: TSH: 0.644 u[IU]/mL (ref 0.350–4.500)

## 2021-10-03 LAB — CMP (CANCER CENTER ONLY)
ALT: 9 U/L (ref 0–44)
AST: 13 U/L — ABNORMAL LOW (ref 15–41)
Albumin: 3.8 g/dL (ref 3.5–5.0)
Alkaline Phosphatase: 154 U/L — ABNORMAL HIGH (ref 38–126)
Anion gap: 8 (ref 5–15)
BUN: 39 mg/dL — ABNORMAL HIGH (ref 8–23)
CO2: 26 mmol/L (ref 22–32)
Calcium: 9.2 mg/dL (ref 8.9–10.3)
Chloride: 103 mmol/L (ref 98–111)
Creatinine: 2.07 mg/dL — ABNORMAL HIGH (ref 0.61–1.24)
GFR, Estimated: 34 mL/min — ABNORMAL LOW (ref >60)
Glucose, Bld: 104 mg/dL — ABNORMAL HIGH (ref 70–99)
Potassium: 4 mmol/L (ref 3.5–5.1)
Sodium: 137 mmol/L (ref 135–145)
Total Bilirubin: 0.5 mg/dL (ref 0.3–1.2)
Total Protein: 7.5 g/dL (ref 6.5–8.1)

## 2021-10-03 MED ORDER — HEPARIN SOD (PORK) LOCK FLUSH 100 UNIT/ML IV SOLN
500.0000 [IU] | Freq: Once | INTRAVENOUS | Status: AC | PRN
  Administered 2021-10-03: 500 [IU]

## 2021-10-03 MED ORDER — SODIUM CHLORIDE 0.9% FLUSH
10.0000 mL | Freq: Once | INTRAVENOUS | Status: AC
  Administered 2021-10-03: 10 mL

## 2021-10-03 MED ORDER — SODIUM CHLORIDE 0.9% FLUSH
10.0000 mL | INTRAVENOUS | Status: DC | PRN
  Administered 2021-10-03: 10 mL

## 2021-10-03 MED ORDER — SODIUM CHLORIDE 0.9 % IV SOLN
200.0000 mg | Freq: Once | INTRAVENOUS | Status: AC
  Administered 2021-10-03: 200 mg via INTRAVENOUS
  Filled 2021-10-03: qty 200

## 2021-10-03 MED ORDER — SODIUM CHLORIDE 0.9 % IV SOLN: Freq: Once | INTRAVENOUS | Status: AC

## 2021-10-03 NOTE — Patient Instructions (Signed)
[name]  ° Discharge Instructions: °Thank you for choosing [name] to provide your oncology and hematology care.  ° °If you have a lab appointment with the [name], please go directly to the [name] and check in at the registration area. °  °Wear comfortable clothing and clothing appropriate for easy access to any Portacath or PICC line.  ° °We strive to give you quality time with your provider. You may need to reschedule your appointment if you arrive late (15 or more minutes).  Arriving late affects you and other patients whose appointments are after yours.  Also, if you miss three or more appointments without notifying the office, you may be dismissed from the clinic at the provider’s discretion.    °  °For prescription refill requests, have your pharmacy contact our office and allow 72 hours for refills to be completed.   ° °Today you received the following chemotherapy and/or immunotherapy agents: Pembrolizumab (Keytruda) °  °To help prevent nausea and vomiting after your treatment, we encourage you to take your nausea medication as directed. ° °BELOW ARE SYMPTOMS THAT SHOULD BE REPORTED IMMEDIATELY: °*FEVER GREATER THAN 100.4 F (38 °C) OR HIGHER °*CHILLS OR SWEATING °*NAUSEA AND VOMITING THAT IS NOT CONTROLLED WITH YOUR NAUSEA MEDICATION °*UNUSUAL SHORTNESS OF BREATH °*UNUSUAL BRUISING OR BLEEDING °*URINARY PROBLEMS (pain or burning when urinating, or frequent urination) °*BOWEL PROBLEMS (unusual diarrhea, constipation, pain near the anus) °TENDERNESS IN MOUTH AND THROAT WITH OR WITHOUT PRESENCE OF ULCERS (sore throat, sores in mouth, or a toothache) °UNUSUAL RASH, SWELLING OR PAIN  °UNUSUAL VAGINAL DISCHARGE OR ITCHING  ° °Items with * indicate a potential emergency and should be followed up as soon as possible or go to the Emergency Department if any problems should occur. ° °Please show the CHEMOTHERAPY ALERT CARD or IMMUNOTHERAPY ALERT CARD  at check-in to the Emergency Department and triage nurse. ° °Should you have questions after your visit or need to cancel or reschedule your appointment, please contact [name]  Dept: [phone]  and follow the prompts.  Office hours are 8:00 a.m. to 4:30 p.m. Monday - Friday. Please note that voicemails left after 4:00 p.m. may not be returned until the following business day.  We are closed weekends and major holidays. You have access to a nurse at all times for urgent questions. Please call the main number to the clinic Dept: [phone] and follow the prompts. ° ° °For any non-urgent questions, you may also contact your provider using MyChart. We now offer e-Visits for anyone 18 and older to request care online for non-urgent symptoms. For details visit [email or web address]. °  °Also download the MyChart app! Go to the app store, search "MyChart", open the app, select [facility], and log in with your MyChart username and password. ° °Due to Covid, a mask is required upon entering the hospital/clinic. If you do not have a mask, one will be given to you upon arrival. For doctor visits, patients may have 1 support person aged 18 or older with them. For treatment visits, patients cannot have anyone with them due to current Covid guidelines and our immunocompromised population.  ° °

## 2021-10-03 NOTE — Progress Notes (Signed)
Hematology and Oncology Follow Up Visit  [name] [id number] 16-Aug-1950 71 y.o. 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  Prior Therapy:  He is status post BCG treatment and June 2022.  Carboplatin and gemcitabine started on June 20, 2021.   He completed 3 cycles of therapy in March 2023.   Current therapy: Pembrolizumab 200 mg every 3 weeks started on August 22, 2021.   He is here for cycle 3 of therapy.  Interim History: Samuel [name] is here for follow-up visit.  Since last visit, he reports no major changes in his health.  He continues to tolerate the current therapy without any complaints.  He denies any nausea, vomiting or abdominal pain.  He denies any recent hospitalizations or illnesses.  He denies any skin rashes or lesions.  His performance status and quality of life remains unchanged.     Medications: Reviewed without changes. Current Outpatient Medications  Medication Sig Dispense Refill   acetaminophen (TYLENOL) 325 MG tablet Take 325 mg by mouth every 6 (six) hours as needed for moderate pain or fever.     albuterol (PROVENTIL) (2.5 MG/3ML) 0.083% nebulizer solution Take 2.5 mg by nebulization in the morning and at bedtime.     albuterol (VENTOLIN HFA) 108 (90 Base) MCG/ACT inhaler Inhale 2 puffs into the lungs every 6 (six) hours as needed for wheezing or shortness of breath. 8 g 6   amLODipine (NORVASC) 10 MG tablet Take 10 mg by mouth at bedtime.     aspirin EC 81 MG tablet Take 1 tablet (81 mg total) by mouth daily. Swallow whole. 90 tablet 3   atorvastatin (LIPITOR) 40 MG tablet Take 1 tablet (40 mg total) by mouth daily. 90 tablet 3   Budeson-Glycopyrrol-Formoterol (BREZTRI AEROSPHERE) 160-9-4.8 MCG/ACT AERO Inhale 2 puffs into the lungs in the morning and at bedtime. 11.8 g 11    Cholecalciferol (VITAMIN D3) 25 MCG (1000 UT) CAPS 2 capsules daily.     citalopram (CELEXA) 20 MG tablet Take 1 tablet (20 mg total) by mouth at bedtime. 90 tablet 1   Cyanocobalamin (B-12 PO) Take 1 capsule by mouth daily.     finasteride (PROSCAR) 5 MG tablet Take 5 mg by mouth daily.     furosemide (LASIX) 20 MG tablet Take 20 mg by mouth 3 (three) times a week.     lidocaine-prilocaine (EMLA) cream Apply 1 application topically as needed. 30 g 0   metoprolol succinate (TOPROL-XL) 25 MG 24 hr tablet Take 25 mg by mouth daily.     oxyCODONE (OXY IR/ROXICODONE) 5 MG immediate release tablet Take 1 tablet (5 mg total) by mouth every 6 (six) hours as needed for moderate pain or severe pain (Post-operatively). (Patient not taking: Reported on 09/25/2021) 25 tablet 0   OXYGEN Inhale 2 L into the lungs See admin instructions. 2L when taking a nap and at bedtime     prochlorperazine (COMPAZINE) 10 MG tablet TAKE 1 TABLET(10 MG) BY MOUTH EVERY 6 HOURS AS NEEDED FOR NAUSEA OR VOMITING (Patient not taking: Reported on 09/25/2021) 30 tablet 0   tamsulosin (FLOMAX) 0.4 MG CAPS capsule Take 2 capsules by mouth at bedtime.     No current facility-administered medications for this visit.     Allergies:  Allergies  Allergen Reactions   Penicillins Diarrhea and Nausea And Vomiting    Did it involve  swelling of the face/tongue/throat, SOB, or low BP? No Did it involve sudden or severe rash/hives, skin peeling, or any reaction on the inside of your mouth or nose?  Did you need to seek medical attention at a hospital or doctor's office? No When did it last happen?    Several Years Ago   If all above answers are "NO", may proceed with cephalosporin use.        Physical Exam:  Blood pressure 117/70, pulse 78, temperature (!) 97.1 F (36.2 C), temperature source Tympanic, resp. rate 16, weight 144 lb 14.4 oz (65.7 kg), SpO2 96 %.    ECOG: 1    General appearance: Comfortable appearing without any  discomfort Head: Normocephalic without any trauma Oropharynx: Mucous membranes are moist and pink without any thrush or ulcers. Eyes: Pupils are equal and round reactive to light. Lymph nodes: No cervical, supraclavicular, inguinal or axillary lymphadenopathy.   Heart:regular rate and rhythm.  S1 and S2 without leg edema. Lung: Clear without any rhonchi or wheezes.  No dullness to percussion. Abdomin: Soft, nontender, nondistended with good bowel sounds.  No hepatosplenomegaly. Musculoskeletal: No joint deformity or effusion.  Full range of motion noted. Neurological: No deficits noted on motor, sensory and deep tendon reflex exam. Skin: No petechial rash or dryness.  Appeared moist.      Lab Results: Lab Results  Component Value Date   WBC 9.5 09/12/2021   HGB 10.8 (L) 09/12/2021   HCT 33.5 (L) 09/12/2021   MCV 88.2 09/12/2021   PLT 269 09/12/2021     Chemistry      Component Value Date/Time   NA 136 09/12/2021 1115   K 4.2 09/12/2021 1115   CL 104 09/12/2021 1115   CO2 23 09/12/2021 1115   BUN 40 (H) 09/12/2021 1115   CREATININE 2.03 (H) 09/12/2021 1115   CREATININE 2.14 (H) 04/04/2020 0748      Component Value Date/Time   CALCIUM 9.1 09/12/2021 1115   ALKPHOS 121 09/12/2021 1115   AST 17 09/12/2021 1115   ALT 14 09/12/2021 1115   BILITOT 0.6 09/12/2021 1115         Impression and Plan:   71 year old with:   1.  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   He has tolerated Pembrolizumab without any major complications.  Risks and benefits of proceeding with treatment today were reiterated.  The plan is to update his staging scan before the next visit.  Different salvage therapy options including systemic chemotherapy and antibody drug conjugate were reviewed.  He is agreeable to proceed with this plan.      2.  IV access: Port-A-Cath continues to be in use without any  issues.   3.  Antiemetics: Compazine is available to him without any nausea or vomiting.   4.  Chronic Kidney disease: Continues to be stable at this time.   5.  Goals of care: His disease is incurable although aggressive measures are warranted given his reasonable performance status.  6.  Anemia: Continues to be mild and asymptomatic at this time.  This is related to malignancy kidney disease.  7.  Autoimmune complications: He has not experienced any complications at this time.  These include pneumonitis, colitis and thyroid disease.   8.  Follow-up: In 3 weeks for repeat follow-up.   30  minutes were spent on this encounter.  The time was dedicated to reviewing laboratory data, disease status update  and outlining future plan of care.     Zola Button, MD 5/24/20239:43 AM

## 2021-10-03 NOTE — Progress Notes (Signed)
Per Dr. Alen Blew - okay to treat with elevated serum creatinine of 2.07. Patient has CKD

## 2021-10-05 ENCOUNTER — Telehealth: Payer: Self-pay | Admitting: Pharmacist

## 2021-10-05 MED ORDER — BREZTRI AEROSPHERE 160-9-4.8 MCG/ACT IN AERO: 2.0000 | INHALATION_SPRAY | Freq: Two times a day (BID) | RESPIRATORY_TRACT | 11 refills | Status: DC

## 2021-10-05 NOTE — Telephone Encounter (Signed)
Received a fax from [facility] requesting a refill for [facility] for a 90 days supply and 3 refills for patient assistance medication. Routing to PCP [facility] to send in a prescription.

## 2021-10-05 NOTE — Telephone Encounter (Signed)
Rx sent to pharmacy   

## 2021-10-09 ENCOUNTER — Other Ambulatory Visit (HOSPITAL_COMMUNITY): Payer: Self-pay | Admitting: Interventional Radiology

## 2021-10-09 ENCOUNTER — Ambulatory Visit (HOSPITAL_COMMUNITY)
Admission: RE | Admit: 2021-10-09 | Discharge: 2021-10-09 | Disposition: A | Discharge status: Home / Self Care | Payer: Medicare Other | Source: Ambulatory Visit | Attending: Interventional Radiology | Admitting: Interventional Radiology

## 2021-10-09 DIAGNOSIS — C679 Malignant neoplasm of bladder, unspecified: Secondary | ICD-10-CM

## 2021-10-09 DIAGNOSIS — Z466 Encounter for fitting and adjustment of urinary device: Secondary | ICD-10-CM | POA: Diagnosis not present

## 2021-10-09 DIAGNOSIS — Z436 Encounter for attention to other artificial openings of urinary tract: Secondary | ICD-10-CM | POA: Insufficient documentation

## 2021-10-09 HISTORY — PX: IR NEPHROSTOMY EXCHANGE LEFT: IMG6069

## 2021-10-09 HISTORY — PX: IR NEPHROSTOMY EXCHANGE RIGHT: IMG6070

## 2021-10-09 MED ORDER — LIDOCAINE HCL 1 % IJ SOLN
INTRAMUSCULAR | Status: AC
  Filled 2021-10-09: qty 20

## 2021-10-10 DIAGNOSIS — F32A Depression, unspecified: Secondary | ICD-10-CM

## 2021-10-10 DIAGNOSIS — N400 Enlarged prostate without lower urinary tract symptoms: Secondary | ICD-10-CM

## 2021-10-10 DIAGNOSIS — H40013 Open angle with borderline findings, low risk, bilateral: Secondary | ICD-10-CM | POA: Diagnosis not present

## 2021-10-10 DIAGNOSIS — J449 Chronic obstructive pulmonary disease, unspecified: Secondary | ICD-10-CM

## 2021-10-10 DIAGNOSIS — Z87891 Personal history of nicotine dependence: Secondary | ICD-10-CM | POA: Diagnosis not present

## 2021-10-10 DIAGNOSIS — E785 Hyperlipidemia, unspecified: Secondary | ICD-10-CM | POA: Diagnosis not present

## 2021-10-10 DIAGNOSIS — I10 Essential (primary) hypertension: Secondary | ICD-10-CM | POA: Diagnosis not present

## 2021-10-10 DIAGNOSIS — H2513 Age-related nuclear cataract, bilateral: Secondary | ICD-10-CM | POA: Diagnosis not present

## 2021-10-10 DIAGNOSIS — D3132 Benign neoplasm of left choroid: Secondary | ICD-10-CM | POA: Diagnosis not present

## 2021-10-11 ENCOUNTER — Telehealth: Payer: Self-pay | Admitting: Oncology

## 2021-10-11 NOTE — Telephone Encounter (Signed)
Scheduled per 05/25 los, patient has been called and notified. 

## 2021-10-12 DIAGNOSIS — J9611 Chronic respiratory failure with hypoxia: Secondary | ICD-10-CM | POA: Diagnosis not present

## 2021-10-18 DIAGNOSIS — C775 Secondary and unspecified malignant neoplasm of intrapelvic lymph nodes: Secondary | ICD-10-CM | POA: Diagnosis not present

## 2021-10-18 DIAGNOSIS — N130 Hydronephrosis with ureteropelvic junction obstruction: Secondary | ICD-10-CM | POA: Diagnosis not present

## 2021-10-18 DIAGNOSIS — C678 Malignant neoplasm of overlapping sites of bladder: Secondary | ICD-10-CM | POA: Diagnosis not present

## 2021-10-18 DIAGNOSIS — N184 Chronic kidney disease, stage 4 (severe): Secondary | ICD-10-CM | POA: Diagnosis not present

## 2021-10-19 ENCOUNTER — Ambulatory Visit (HOSPITAL_COMMUNITY)
Admission: RE | Admit: 2021-10-19 | Discharge: 2021-10-19 | Disposition: A | Discharge status: Home / Self Care | Payer: Medicare Other | Source: Ambulatory Visit | Attending: Oncology | Admitting: Oncology

## 2021-10-19 DIAGNOSIS — K5730 Diverticulosis of large intestine without perforation or abscess without bleeding: Secondary | ICD-10-CM | POA: Diagnosis not present

## 2021-10-19 DIAGNOSIS — I2510 Atherosclerotic heart disease of native coronary artery without angina pectoris: Secondary | ICD-10-CM | POA: Diagnosis not present

## 2021-10-19 DIAGNOSIS — C679 Malignant neoplasm of bladder, unspecified: Secondary | ICD-10-CM | POA: Diagnosis not present

## 2021-10-19 DIAGNOSIS — Q798 Other congenital malformations of musculoskeletal system: Secondary | ICD-10-CM | POA: Diagnosis not present

## 2021-10-19 LAB — GLUCOSE, CAPILLARY: Glucose-Capillary: 112 mg/dL — ABNORMAL HIGH (ref 70–99)

## 2021-10-19 MED ORDER — FLUDEOXYGLUCOSE F - 18 (FDG) INJECTION
7.1100 | Freq: Once | INTRAVENOUS | Status: AC
  Administered 2021-10-19: 7.11 via INTRAVENOUS

## 2021-10-23 ENCOUNTER — Inpatient Hospital Stay: Payer: Medicare Other

## 2021-10-23 ENCOUNTER — Other Ambulatory Visit: Payer: Self-pay

## 2021-10-23 ENCOUNTER — Inpatient Hospital Stay: Payer: Medicare Other | Attending: Oncology | Admitting: Oncology

## 2021-10-23 VITALS — BP 118/75 | HR 82 | Temp 98.1°F | Resp 16 | Wt 143.1 lb

## 2021-10-23 DIAGNOSIS — D649 Anemia, unspecified: Secondary | ICD-10-CM | POA: Insufficient documentation

## 2021-10-23 DIAGNOSIS — Z5111 Encounter for antineoplastic chemotherapy: Secondary | ICD-10-CM | POA: Diagnosis not present

## 2021-10-23 DIAGNOSIS — C679 Malignant neoplasm of bladder, unspecified: Secondary | ICD-10-CM | POA: Diagnosis not present

## 2021-10-23 DIAGNOSIS — C7951 Secondary malignant neoplasm of bone: Secondary | ICD-10-CM | POA: Diagnosis not present

## 2021-10-23 DIAGNOSIS — Z79899 Other long term (current) drug therapy: Secondary | ICD-10-CM | POA: Insufficient documentation

## 2021-10-23 DIAGNOSIS — N189 Chronic kidney disease, unspecified: Secondary | ICD-10-CM | POA: Insufficient documentation

## 2021-10-23 LAB — TSH: TSH: 2.592 u[IU]/mL (ref 0.350–4.500)

## 2021-10-23 LAB — CBC WITH DIFFERENTIAL (CANCER CENTER ONLY)
Abs Immature Granulocytes: 0.04 10*3/uL (ref 0.00–0.07)
Basophils Absolute: 0 10*3/uL (ref 0.0–0.1)
Basophils Relative: 0 %
Eosinophils Absolute: 0.2 10*3/uL (ref 0.0–0.5)
Eosinophils Relative: 3 %
HCT: 33.7 % — ABNORMAL LOW (ref 39.0–52.0)
Hemoglobin: 11.1 g/dL — ABNORMAL LOW (ref 13.0–17.0)
Immature Granulocytes: 1 %
Lymphocytes Relative: 9 %
Lymphs Abs: 0.7 10*3/uL (ref 0.7–4.0)
MCH: 29.1 pg (ref 26.0–34.0)
MCHC: 32.9 g/dL (ref 30.0–36.0)
MCV: 88.5 fL (ref 80.0–100.0)
Monocytes Absolute: 0.8 10*3/uL (ref 0.1–1.0)
Monocytes Relative: 10 %
Neutro Abs: 5.9 10*3/uL (ref 1.7–7.7)
Neutrophils Relative %: 77 %
Platelet Count: 366 10*3/uL (ref 150–400)
RBC: 3.81 MIL/uL — ABNORMAL LOW (ref 4.22–5.81)
RDW: 15.6 % — ABNORMAL HIGH (ref 11.5–15.5)
WBC Count: 7.6 10*3/uL (ref 4.0–10.5)
nRBC: 0 % (ref 0.0–0.2)

## 2021-10-23 LAB — CMP (CANCER CENTER ONLY)
ALT: 7 U/L (ref 0–44)
AST: 13 U/L — ABNORMAL LOW (ref 15–41)
Albumin: 3.7 g/dL (ref 3.5–5.0)
Alkaline Phosphatase: 133 U/L — ABNORMAL HIGH (ref 38–126)
Anion gap: 8 (ref 5–15)
BUN: 32 mg/dL — ABNORMAL HIGH (ref 8–23)
CO2: 24 mmol/L (ref 22–32)
Calcium: 9.5 mg/dL (ref 8.9–10.3)
Chloride: 106 mmol/L (ref 98–111)
Creatinine: 1.98 mg/dL — ABNORMAL HIGH (ref 0.61–1.24)
GFR, Estimated: 35 mL/min — ABNORMAL LOW (ref >60)
Glucose, Bld: 109 mg/dL — ABNORMAL HIGH (ref 70–99)
Potassium: 4.1 mmol/L (ref 3.5–5.1)
Sodium: 138 mmol/L (ref 135–145)
Total Bilirubin: 0.4 mg/dL (ref 0.3–1.2)
Total Protein: 7.5 g/dL (ref 6.5–8.1)

## 2021-10-23 MED ORDER — SODIUM CHLORIDE 0.9% FLUSH
10.0000 mL | INTRAVENOUS | Status: DC | PRN
  Administered 2021-10-23: 10 mL

## 2021-10-23 MED ORDER — HEPARIN SOD (PORK) LOCK FLUSH 100 UNIT/ML IV SOLN
500.0000 [IU] | Freq: Once | INTRAVENOUS | Status: AC | PRN
  Administered 2021-10-23: 500 [IU]

## 2021-10-23 MED ORDER — SODIUM CHLORIDE 0.9 % IV SOLN: Freq: Once | INTRAVENOUS | Status: AC

## 2021-10-23 MED ORDER — SODIUM CHLORIDE 0.9 % IV SOLN
200.0000 mg | Freq: Once | INTRAVENOUS | Status: AC
  Administered 2021-10-23: 200 mg via INTRAVENOUS
  Filled 2021-10-23: qty 200

## 2021-10-23 NOTE — Progress Notes (Signed)
DISCONTINUE ON PATHWAY REGIMEN - Bladder     A cycle is every 21 days:     Pembrolizumab   **Always confirm dose/schedule in your pharmacy ordering system**  REASON: Disease Progression PRIOR TREATMENT: [id number]: Pembrolizumab 200 mg q21 Days Until Progression,  Unacceptable Toxicity, or up to 24 Months TREATMENT RESPONSE: Partial Response (PR)  START ON PATHWAY REGIMEN - Bladder     A cycle is every 28 days:     Enfortumab vedotin-ejfv   **Always confirm dose/schedule in your pharmacy ordering system**  Patient Characteristics: Advanced/Metastatic Disease, Third Line and Beyond Therapeutic Status: Advanced/Metastatic Disease Line of Therapy: Third Line and Beyond  Intent of Therapy: Non-Curative / Palliative Intent, Discussed with Patient

## 2021-10-23 NOTE — Progress Notes (Signed)
Ok to treat with creat 1.98 per Dr Alen Blew mg/dL

## 2021-10-23 NOTE — Progress Notes (Signed)
Hematology and Oncology Follow Up Visit  Samuel [name] [id number] 1950-06-06 71 y.o. 10/23/2021 9:55 AM Carlisle Cater, Augustine Radar, [name], NP   Principle Diagnosis: 71 year old with bladder cancer diagnosed in December 2022.  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  Prior Therapy:  He is status post BCG treatment and June 2022.  Carboplatin and gemcitabine started on June 20, 2021.   He completed 3 cycles of therapy in March 2023.   Current therapy: Pembrolizumab 200 mg every 3 weeks started on August 22, 2021.   He is here for cycle 4 of therapy.  Interim History: Mr. Keesling is here for a follow-up visit.  Since last visit, he reports feeling well without any major complaints.  He denies any nausea, vomiting or abdominal pain.  He denies any skin rashes or lesions.  He denies any hospitalizations or illnesses.  He denies any shortness of breath or difficulty breathing.     Medications: Updated on review. Current Outpatient Medications  Medication Sig Dispense Refill   acetaminophen (TYLENOL) 325 MG tablet Take 325 mg by mouth every 6 (six) hours as needed for moderate pain or fever.     albuterol (PROVENTIL) (2.5 MG/3ML) 0.083% nebulizer solution Take 2.5 mg by nebulization in the morning and at bedtime.     albuterol (VENTOLIN HFA) 108 (90 Base) MCG/ACT inhaler Inhale 2 puffs into the lungs every 6 (six) hours as needed for wheezing or shortness of breath. 8 g 6   amLODipine (NORVASC) 10 MG tablet Take 10 mg by mouth at bedtime.     aspirin EC 81 MG tablet Take 1 tablet (81 mg total) by mouth daily. Swallow whole. 90 tablet 3   atorvastatin (LIPITOR) 40 MG tablet Take 1 tablet (40 mg total) by mouth daily. 90 tablet 3   Budeson-Glycopyrrol-Formoterol (BREZTRI AEROSPHERE) 160-9-4.8 MCG/ACT AERO Inhale 2 puffs into the lungs in the morning and at bedtime. 11.8 g 11   Cholecalciferol (VITAMIN D3) 25 MCG (1000 UT) CAPS 2  capsules daily.     citalopram (CELEXA) 20 MG tablet Take 1 tablet (20 mg total) by mouth at bedtime. 90 tablet 1   Cyanocobalamin (B-12 PO) Take 1 capsule by mouth daily.     finasteride (PROSCAR) 5 MG tablet Take 5 mg by mouth daily.     furosemide (LASIX) 20 MG tablet Take 20 mg by mouth 3 (three) times a week.     lidocaine-prilocaine (EMLA) cream Apply 1 application topically as needed. 30 g 0   metoprolol succinate (TOPROL-XL) 25 MG 24 hr tablet Take 25 mg by mouth daily.     oxyCODONE (OXY IR/ROXICODONE) 5 MG immediate release tablet Take 1 tablet (5 mg total) by mouth every 6 (six) hours as needed for moderate pain or severe pain (Post-operatively). (Patient not taking: Reported on 09/25/2021) 25 tablet 0   OXYGEN Inhale 2 L into the lungs See admin instructions. 2L when taking a nap and at bedtime     prochlorperazine (COMPAZINE) 10 MG tablet TAKE 1 TABLET(10 MG) BY MOUTH EVERY 6 HOURS AS NEEDED FOR NAUSEA OR VOMITING (Patient not taking: Reported on 09/25/2021) 30 tablet 0   tamsulosin (FLOMAX) 0.4 MG CAPS capsule Take 2 capsules by mouth at bedtime.     No current facility-administered medications for this visit.     Allergies:  Allergies  Allergen Reactions   Penicillins Diarrhea and Nausea And Vomiting    Did it involve swelling of the face/tongue/throat, SOB, or  low BP? No Did it involve sudden or severe rash/hives, skin peeling, or any reaction on the inside of your mouth or nose?  Did you need to seek medical attention at a hospital or doctor's office? No When did it last happen?    Several Years Ago   If all above answers are "NO", may proceed with cephalosporin use.        Physical Exam:   Blood pressure 118/75, pulse 82, temperature 98.1 F (36.7 C), temperature source Tympanic, resp. rate 16, weight 143 lb 1.6 oz (64.9 kg), SpO2 94 %.    ECOG: 1   General appearance: Alert, awake without any distress. Head: Atraumatic without abnormalities Oropharynx:  Without any thrush or ulcers. Eyes: No scleral icterus. Lymph nodes: No lymphadenopathy noted in the cervical, supraclavicular, or axillary nodes Heart:regular rate and rhythm, without any murmurs or gallops.   Lung: Clear to auscultation without any rhonchi, wheezes or dullness to percussion. Abdomin: Soft, nontender without any shifting dullness or ascites. Musculoskeletal: No clubbing or cyanosis. Neurological: No motor or sensory deficits. Skin: No rashes or lesions.     Lab Results: Lab Results  Component Value Date   WBC 7.0 10/03/2021   HGB 11.0 (L) 10/03/2021   HCT 33.6 (L) 10/03/2021   MCV 88.9 10/03/2021   PLT 366 10/03/2021   PSA 3.13 04/04/2020     Chemistry      Component Value Date/Time   NA 137 10/03/2021 1001   K 4.0 10/03/2021 1001   CL 103 10/03/2021 1001   CO2 26 10/03/2021 1001   BUN 39 (H) 10/03/2021 1001   CREATININE 2.07 (H) 10/03/2021 1001   CREATININE 2.14 (H) 04/04/2020 0748      Component Value Date/Time   CALCIUM 9.2 10/03/2021 1001   ALKPHOS 154 (H) 10/03/2021 1001   AST 13 (L) 10/03/2021 1001   ALT 9 10/03/2021 1001   BILITOT 0.5 10/03/2021 1001      IMPRESSION: 1. Substantially increased widespread scattered osseous metastatic disease. 2. Roughly stable size and activity of hypermetabolic retroperitoneal and pelvic adenopathy. 3. Probable hypermetabolic bladder tumor, although difficult to separate from excreted urinary FDG. 4. Other imaging findings of potential clinical significance: Aortic Atherosclerosis (ICD10-I70.0). Coronary atherosclerosis. Systemic atherosclerosis. Stable thoracic manifestations of [place] syndrome. Bilateral percutaneous nephrostomy tubes. Sigmoid colon diverticulosis.     Impression and Plan:   71 year old with:   1.  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   His disease status was updated at this time and treatment choices were  reviewed.  Scan obtained on October 19, 2021 was personally reviewed and showed progression of disease predominantly with widespread osseous metastasis.  His imaging studies were shared with the patient and his wife today.  Choices were reiterated.  Switching to a different salvage therapy including Padcev were discussed.  Complications that include neuropathy, hyperglycemia GI toxicity were reviewed.  After discussion today, we opted to proceed with Pembrolizumab today and switch to [facility] with the next visit on November 14, 2021.     2.  IV access: Port-A-Cath remains in use without any issues.   3.  Antiemetics: No nausea or vomiting reported at this time.  Compazine is available to him.   4.  Chronic Kidney disease: Creatinine clearance remains around 35 cc/min.   5.  Goals of care: Therapy remains palliative though aggressive measures are warranted given his recent status.  6.  Anemia: His hemoglobin is 11 and does  not require any growth factor support.  7.  Autoimmune complications: I continue to educate her about potential complications including pneumonitis, colitis and thyroid disease.   8.  Follow-up: In 3 weeks for a follow-up visit.   30  minutes were spent on this visit.  The time was dedicated to reviewing imaging studies, treatment choices and addressing complications related to cancer and cancer therapy.     Zola Button, MD 6/13/20239:55 AM

## 2021-10-23 NOTE — Patient Instructions (Signed)
[facility]COL[facility]  Discharge Instructions: ?Thank you for choosing [facility] to provide your oncology and hematology care.  ? ?If you have a lab appointment with the Cancer Center, please go directly to the Cancer Center and check in at the registration area. ?  ?Wear comfortable clothing and clothing appropriate for easy access to any Portacath or PICC line.  ? ?We strive to give you quality time with your provider. You may need to reschedule your appointment if you arrive late (15 or more minutes).  Arriving late affects you and other patients whose appointments are after yours.  Also, if you miss three or more appointments without notifying the office, you may be dismissed from the clinic at the provider?s discretion.    ?  ?For prescription refill requests, have your pharmacy contact our office and allow 72 hours for refills to be completed.   ? ?Today you received the following chemotherapy and/or immunotherapy agents: Keytruda ?  ?To help prevent nausea and vomiting after your treatment, we encourage you to take your nausea medication as directed. ? ?BELOW ARE SYMPTOMS THAT SHOULD BE REPORTED IMMEDIATELY: ?*FEVER GREATER THAN 100.4 F (38 ?C) OR HIGHER ?*CHILLS OR SWEATING ?*NAUSEA AND VOMITING THAT IS NOT CONTROLLED WITH YOUR NAUSEA MEDICATION ?*UNUSUAL SHORTNESS OF BREATH ?*UNUSUAL BRUISING OR BLEEDING ?*URINARY PROBLEMS (pain or burning when urinating, or frequent urination) ?*BOWEL PROBLEMS (unusual diarrhea, constipation, pain near the anus) ?TENDERNESS IN MOUTH AND THROAT WITH OR WITHOUT PRESENCE OF ULCERS (sore throat, sores in mouth, or a toothache) ?UNUSUAL RASH, SWELLING OR PAIN  ?UNUSUAL VAGINAL DISCHARGE OR ITCHING  ? ?Items with * indicate a potential emergency and should be followed up as soon as possible or go to the Emergency Department if any problems should occur. ? ?Please show the CHEMOTHERAPY ALERT CARD or IMMUNOTHERAPY ALERT CARD at check-in to the  Emergency Department and triage nurse. ? ?Should you have questions after your visit or need to cancel or reschedule your appointment, please contact [facility]  Dept: [phone]  and follow the prompts.  Office hours are 8:00 a.m. to 4:30 p.m. Monday - Friday. Please note that voicemails left after 4:00 p.m. may not be returned until the following business day.  We are closed weekends and major holidays. You have access to a nurse at all times for urgent questions. Please call the main number to the clinic Dept: [phone] and follow the prompts. ? ? ?For any non-urgent questions, you may also contact your provider using MyChart. We now offer e-Visits for anyone 18 and older to request care online for non-urgent symptoms. For details visit [email or web address]. ?  ?Also download the MyChart app! Go to the app store, search "MyChart", open the app, select , and log in with your MyChart username and password. ? ?Due to Covid, a mask is required upon entering the hospital/clinic. If you do not have a mask, one will be given to you upon arrival. For doctor visits, patients may have 1 support person aged 18 or older with them. For treatment visits, patients cannot have anyone with them due to current Covid guidelines and our immunocompromised population.  ? ?

## 2021-10-23 NOTE — Progress Notes (Signed)
Patient's creat. Level is 1.98. OK to treat per Dr. Alen Blew. RN notified.

## 2021-10-25 ENCOUNTER — Encounter: Payer: Self-pay | Admitting: Oncology

## 2021-11-11 DIAGNOSIS — J9611 Chronic respiratory failure with hypoxia: Secondary | ICD-10-CM | POA: Diagnosis not present

## 2021-11-12 DIAGNOSIS — N1832 Chronic kidney disease, stage 3b: Secondary | ICD-10-CM | POA: Diagnosis not present

## 2021-11-12 MED FILL — Dexamethasone Sodium Phosphate Inj 100 MG/10ML: INTRAMUSCULAR | Qty: 1 | Status: AC

## 2021-11-14 ENCOUNTER — Inpatient Hospital Stay: Payer: Medicare Other

## 2021-11-14 ENCOUNTER — Other Ambulatory Visit: Payer: Self-pay

## 2021-11-14 ENCOUNTER — Inpatient Hospital Stay (HOSPITAL_BASED_OUTPATIENT_CLINIC_OR_DEPARTMENT_OTHER): Payer: Medicare Other | Admitting: Oncology

## 2021-11-14 ENCOUNTER — Telehealth: Payer: Self-pay | Admitting: Adult Health

## 2021-11-14 ENCOUNTER — Inpatient Hospital Stay: Payer: Medicare Other | Attending: Oncology

## 2021-11-14 VITALS — BP 121/73 | HR 84 | Temp 97.9°F | Resp 19 | Ht 63.0 in | Wt 141.7 lb

## 2021-11-14 VITALS — BP 125/74 | HR 73 | Temp 97.7°F | Resp 18

## 2021-11-14 DIAGNOSIS — Z95828 Presence of other vascular implants and grafts: Secondary | ICD-10-CM

## 2021-11-14 DIAGNOSIS — R599 Enlarged lymph nodes, unspecified: Secondary | ICD-10-CM | POA: Diagnosis not present

## 2021-11-14 DIAGNOSIS — C679 Malignant neoplasm of bladder, unspecified: Secondary | ICD-10-CM

## 2021-11-14 DIAGNOSIS — M898X9 Other specified disorders of bone, unspecified site: Secondary | ICD-10-CM | POA: Insufficient documentation

## 2021-11-14 DIAGNOSIS — Z5111 Encounter for antineoplastic chemotherapy: Secondary | ICD-10-CM | POA: Diagnosis not present

## 2021-11-14 DIAGNOSIS — D630 Anemia in neoplastic disease: Secondary | ICD-10-CM | POA: Insufficient documentation

## 2021-11-14 DIAGNOSIS — Z79899 Other long term (current) drug therapy: Secondary | ICD-10-CM | POA: Insufficient documentation

## 2021-11-14 DIAGNOSIS — N189 Chronic kidney disease, unspecified: Secondary | ICD-10-CM | POA: Insufficient documentation

## 2021-11-14 LAB — CMP (CANCER CENTER ONLY)
ALT: 9 U/L (ref 0–44)
AST: 18 U/L (ref 15–41)
Albumin: 3.7 g/dL (ref 3.5–5.0)
Alkaline Phosphatase: 179 U/L — ABNORMAL HIGH (ref 38–126)
Anion gap: 9 (ref 5–15)
BUN: 35 mg/dL — ABNORMAL HIGH (ref 8–23)
CO2: 25 mmol/L (ref 22–32)
Calcium: 9.4 mg/dL (ref 8.9–10.3)
Chloride: 104 mmol/L (ref 98–111)
Creatinine: 1.9 mg/dL — ABNORMAL HIGH (ref 0.61–1.24)
GFR, Estimated: 37 mL/min — ABNORMAL LOW (ref >60)
Glucose, Bld: 105 mg/dL — ABNORMAL HIGH (ref 70–99)
Potassium: 4.1 mmol/L (ref 3.5–5.1)
Sodium: 138 mmol/L (ref 135–145)
Total Bilirubin: 0.4 mg/dL (ref 0.3–1.2)
Total Protein: 7.5 g/dL (ref 6.5–8.1)

## 2021-11-14 LAB — CBC WITH DIFFERENTIAL (CANCER CENTER ONLY)
Abs Immature Granulocytes: 0.08 10*3/uL — ABNORMAL HIGH (ref 0.00–0.07)
Basophils Absolute: 0 10*3/uL (ref 0.0–0.1)
Basophils Relative: 0 %
Eosinophils Absolute: 0.2 10*3/uL (ref 0.0–0.5)
Eosinophils Relative: 2 %
HCT: 34.5 % — ABNORMAL LOW (ref 39.0–52.0)
Hemoglobin: 11.1 g/dL — ABNORMAL LOW (ref 13.0–17.0)
Immature Granulocytes: 1 %
Lymphocytes Relative: 7 %
Lymphs Abs: 0.7 10*3/uL (ref 0.7–4.0)
MCH: 27.9 pg (ref 26.0–34.0)
MCHC: 32.2 g/dL (ref 30.0–36.0)
MCV: 86.7 fL (ref 80.0–100.0)
Monocytes Absolute: 0.9 10*3/uL (ref 0.1–1.0)
Monocytes Relative: 9 %
Neutro Abs: 8.3 10*3/uL — ABNORMAL HIGH (ref 1.7–7.7)
Neutrophils Relative %: 81 %
Platelet Count: 411 10*3/uL — ABNORMAL HIGH (ref 150–400)
RBC: 3.98 MIL/uL — ABNORMAL LOW (ref 4.22–5.81)
RDW: 15.2 % (ref 11.5–15.5)
WBC Count: 10.2 10*3/uL (ref 4.0–10.5)
nRBC: 0 % (ref 0.0–0.2)

## 2021-11-14 LAB — TSH: TSH: 2.734 u[IU]/mL (ref 0.350–4.500)

## 2021-11-14 MED ORDER — SODIUM CHLORIDE 0.9% FLUSH
10.0000 mL | Freq: Once | INTRAVENOUS | Status: AC
  Administered 2021-11-14: 10 mL

## 2021-11-14 MED ORDER — SODIUM CHLORIDE 0.9 % IV SOLN
10.0000 mg | Freq: Once | INTRAVENOUS | Status: AC
  Administered 2021-11-14: 10 mg via INTRAVENOUS
  Filled 2021-11-14: qty 10

## 2021-11-14 MED ORDER — SODIUM CHLORIDE 0.9 % IV SOLN
0.9200 mg/kg | Freq: Once | INTRAVENOUS | Status: AC
  Administered 2021-11-14: 60 mg via INTRAVENOUS
  Filled 2021-11-14: qty 6

## 2021-11-14 MED ORDER — HEPARIN SOD (PORK) LOCK FLUSH 100 UNIT/ML IV SOLN
500.0000 [IU] | Freq: Once | INTRAVENOUS | Status: AC | PRN
  Administered 2021-11-14: 500 [IU]

## 2021-11-14 MED ORDER — SODIUM CHLORIDE 0.9 % IV SOLN: Freq: Once | INTRAVENOUS | Status: AC

## 2021-11-14 MED ORDER — PALONOSETRON HCL INJECTION 0.25 MG/5ML
0.2500 mg | Freq: Once | INTRAVENOUS | Status: AC
  Administered 2021-11-14: 0.25 mg via INTRAVENOUS
  Filled 2021-11-14: qty 5

## 2021-11-14 MED ORDER — SODIUM CHLORIDE 0.9% FLUSH
10.0000 mL | INTRAVENOUS | Status: DC | PRN
  Administered 2021-11-14: 10 mL

## 2021-11-14 NOTE — Progress Notes (Signed)
Hematology and Oncology Follow Up Visit  Samuel [name] [id number] 12/16/1950 71 y.o. 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    Prior Therapy:  He is status post BCG treatment and June 2022.  Carboplatin and gemcitabine started on June 20, 2021.   He completed 3 cycles of therapy in March 2023.  Pembrolizumab 200 mg every 3 weeks started on August 22, 2021.   He completed 4 cycles of therapy.  Current therapy: Padcev 1 mg/kg scheduled to start November 14, 2021.  Interim History: Samuel [name] is here for repeat evaluation.  Since the last visit, he reports no major complaints.  He has reported some discomfort in his shoulder and hips have used Tylenol periodically.  And variable success associated with it.  Denies any nausea vomiting or abdominal pain.  He denies any hospitalizations or illnesses.  His performance status and quality of life remains unchanged.     Medications: Reviewed without changes. Current Outpatient Medications  Medication Sig Dispense Refill   acetaminophen (TYLENOL) 325 MG tablet Take 325 mg by mouth every 6 (six) hours as needed for moderate pain or fever.     albuterol (PROVENTIL) (2.5 MG/3ML) 0.083% nebulizer solution Take 2.5 mg by nebulization in the morning and at bedtime.     albuterol (VENTOLIN HFA) 108 (90 Base) MCG/ACT inhaler Inhale 2 puffs into the lungs every 6 (six) hours as needed for wheezing or shortness of breath. 8 g 6   amLODipine (NORVASC) 10 MG tablet Take 10 mg by mouth at bedtime.     aspirin EC 81 MG tablet Take 1 tablet (81 mg total) by mouth daily. Swallow whole. 90 tablet 3   atorvastatin (LIPITOR) 40 MG tablet Take 1 tablet (40 mg total) by mouth daily. 90 tablet 3   Budeson-Glycopyrrol-Formoterol (BREZTRI AEROSPHERE) 160-9-4.8 MCG/ACT AERO Inhale 2 puffs into the  lungs in the morning and at bedtime. 11.8 g 11   Cholecalciferol (VITAMIN D3) 25 MCG (1000 UT) CAPS 2 capsules daily.     citalopram (CELEXA) 20 MG tablet Take 1 tablet (20 mg total) by mouth at bedtime. 90 tablet 1   Cyanocobalamin (B-12 PO) Take 1 capsule by mouth daily.     finasteride (PROSCAR) 5 MG tablet Take 5 mg by mouth daily.     furosemide (LASIX) 20 MG tablet Take 20 mg by mouth 3 (three) times a week.     lidocaine-prilocaine (EMLA) cream Apply 1 application topically as needed. 30 g 0   metoprolol succinate (TOPROL-XL) 25 MG 24 hr tablet Take 25 mg by mouth daily.     oxyCODONE (OXY IR/ROXICODONE) 5 MG immediate release tablet Take 1 tablet (5 mg total) by mouth every 6 (six) hours as needed for moderate pain or severe pain (Post-operatively). (Patient not taking: Reported on 09/25/2021) 25 tablet 0   OXYGEN Inhale 2 L into the lungs See admin instructions. 2L when taking a nap and at bedtime     prochlorperazine (COMPAZINE) 10 MG tablet TAKE 1 TABLET(10 MG) BY MOUTH EVERY 6 HOURS AS NEEDED FOR NAUSEA OR VOMITING (Patient not taking: Reported on 09/25/2021) 30 tablet 0   tamsulosin (FLOMAX) 0.4 MG CAPS capsule Take 2 capsules by mouth at bedtime.     No current facility-administered medications for this visit.     Allergies:  Allergies  Allergen Reactions   Penicillins Diarrhea and Nausea  And Vomiting    Did it involve swelling of the face/tongue/throat, SOB, or low BP? No Did it involve sudden or severe rash/hives, skin peeling, or any reaction on the inside of your mouth or nose?  Did you need to seek medical attention at a hospital or doctor's office? No When did it last happen?    Several Years Ago   If all above answers are "NO", may proceed with cephalosporin use.        Physical Exam:   Blood pressure 121/73, pulse 84, temperature 97.9 F (36.6 C), temperature source Oral, resp. rate 19, height '5\' 3"'$  (1.6 m), weight 141 lb 11.2 oz (64.3 kg), SpO2 93  %.     ECOG: 1    General appearance: Comfortable appearing without any discomfort Head: Normocephalic without any trauma Oropharynx: Mucous membranes are moist and pink without any thrush or ulcers. Eyes: Pupils are equal and round reactive to light. Lymph nodes: No cervical, supraclavicular, inguinal or axillary lymphadenopathy.   Heart:regular rate and rhythm.  S1 and S2 without leg edema. Lung: Clear without any rhonchi or wheezes.  No dullness to percussion. Abdomin: Soft, nontender, nondistended with good bowel sounds.  No hepatosplenomegaly. Musculoskeletal: No joint deformity or effusion.  Full range of motion noted. Neurological: No deficits noted on motor, sensory and deep tendon reflex exam. Skin: No petechial rash or dryness.  Appeared moist.  .      Lab Results: Lab Results  Component Value Date   WBC 10.2 11/14/2021   HGB 11.1 (L) 11/14/2021   HCT 34.5 (L) 11/14/2021   MCV 86.7 11/14/2021   PLT 411 (H) 11/14/2021   PSA 3.13 04/04/2020     Chemistry      Component Value Date/Time   NA 138 10/23/2021 0941   K 4.1 10/23/2021 0941   CL 106 10/23/2021 0941   CO2 24 10/23/2021 0941   BUN 32 (H) 10/23/2021 0941   CREATININE 1.98 (H) 10/23/2021 0941   CREATININE 2.14 (H) 04/04/2020 0748      Component Value Date/Time   CALCIUM 9.5 10/23/2021 0941   ALKPHOS 133 (H) 10/23/2021 0941   AST 13 (L) 10/23/2021 0941   ALT 7 10/23/2021 0941   BILITOT 0.4 10/23/2021 0941         Impression and Plan:   71 year old with:   1.  Bladder cancer diagnosed in December 2022.  He was found to have stage IV high-grade urothelial carcinoma with adenopathy.   He is set to start palliative chemotherapy utilizing Padcev.  Complication associated with this treatment were reviewed at this time.  These include nausea, vomiting, hyperglycemia and neuropathy.  The plan is to update his staging scans after 3 cycles of therapy.  He is agreeable to proceed.   2.  IV access:  Port-A-Cath remains in place and in use without any issues.   3.  Antiemetics: Compazine is available to him without any nausea or vomiting.   4.  Chronic Kidney disease: His kidney function remains stable with creatinine clearance around 35 cc/min.   5.  Goals of care: Any treatment is palliative at this time although aggressive measures are warranted.  6.  Anemia: Related to malignancy and chemotherapy.  Hemoglobin is adequate at this time.  7.  Bone pain: He is currently on Tylenol and has access to oxycodone.  I recommended using if needed.  8.  Follow-up: He will receive weekly chemotherapy and in 4 weeks for an evaluation for cycle 2 of therapy.  30  minutes were dedicated to this encounter.  The time spent on reviewing laboratory data, disease status update and future treatment options.     Zola Button, MD 7/5/202311:17 AM

## 2021-11-14 NOTE — Telephone Encounter (Signed)
Called Earlie Server pt wife on both numbers no answer. She will need to schedule an ov to discuss update or can upload to pt's Mychart.

## 2021-11-14 NOTE — Progress Notes (Signed)
Ok to treat with elevated Scr. 1.9 today per Dr. Alen Blew

## 2021-11-14 NOTE — Telephone Encounter (Signed)
Pt wife call and stated she want [name] to give her a call back so she can give him a up date on pt.

## 2021-11-14 NOTE — Patient Instructions (Addendum)
[facility]  Discharge Instructions: Thank you for choosing [facility] to provide your oncology and hematology care.   If you have a lab appointment with the [facility], please go directly to the [facility] and check in at the registration area.   Wear comfortable clothing and clothing appropriate for easy access to any Portacath or PICC line.   We strive to give you quality time with your provider. You may need to reschedule your appointment if you arrive late (15 or more minutes).  Arriving late affects you and other patients whose appointments are after yours.  Also, if you miss three or more appointments without notifying the office, you may be dismissed from the clinic at the provider's discretion.      For prescription refill requests, have your pharmacy contact our office and allow 72 hours for refills to be completed.    Today you received the following chemotherapy and/or immunotherapy agents: Padcev      To help prevent nausea and vomiting after your treatment, we encourage you to take your nausea medication as directed.  BELOW ARE SYMPTOMS THAT SHOULD BE REPORTED IMMEDIATELY: *FEVER GREATER THAN 100.4 F (38 C) OR HIGHER *CHILLS OR SWEATING *NAUSEA AND VOMITING THAT IS NOT CONTROLLED WITH YOUR NAUSEA MEDICATION *UNUSUAL SHORTNESS OF BREATH *UNUSUAL BRUISING OR BLEEDING *URINARY PROBLEMS (pain or burning when urinating, or frequent urination) *BOWEL PROBLEMS (unusual diarrhea, constipation, pain near the anus) TENDERNESS IN MOUTH AND THROAT WITH OR WITHOUT PRESENCE OF ULCERS (sore throat, sores in mouth, or a toothache) UNUSUAL RASH, SWELLING OR PAIN  UNUSUAL VAGINAL DISCHARGE OR ITCHING   Items with * indicate a potential emergency and should be followed up as soon as possible or go to the Emergency Department if any problems should occur.  Please show the CHEMOTHERAPY ALERT CARD or IMMUNOTHERAPY ALERT CARD at check-in to the  Emergency Department and triage nurse.  Should you have questions after your visit or need to cancel or reschedule your appointment, please contact [facility]  Dept: [phone]  and follow the prompts.  Office hours are 8:00 a.m. to 4:30 p.m. Monday - Friday. Please note that voicemails left after 4:00 p.m. may not be returned until the following business day.  We are closed weekends and major holidays. You have access to a nurse at all times for urgent questions. Please call the main number to the clinic Dept: [phone] and follow the prompts.   For any non-urgent questions, you may also contact your provider using MyChart. We now offer e-Visits for anyone 52 and older to request care online for non-urgent symptoms. For details visit [email or web address].   Also download the MyChart app! Go to the app store, search "MyChart", open the app, select [facility], and log in with your MyChart username and password.  Masks are optional in the cancer centers. If you would like for your care team to wear a mask while they are taking care of you, please let them know. For doctor visits, patients may have with them one support person who is at least 72 years old. At this time, visitors are not allowed in the infusion area.    Enfortumab vedotin injection What is this medication? ENFORTUMAB VEDOTIN (en FORT ue mab ve DOE tin) is a chemotherapy medicine and monoclonal antibody. It treats urothelial cancer. This medicine may be used for other purposes; ask your health care provider or pharmacist if you have questions. COMMON BRAND  NAME(S): PADCEV What should I tell my care team before I take this medication? They need to know if you have any of these conditions: diabetes (high blood sugar) eye disease, vision problems lung disease skin conditions or sensitivity tingling of the fingers or toes, or other nerve disorder an unusual or allergic reaction to enfortumab  vedotin, other medicines, foods, dyes or preservatives pregnant or trying to get pregnant breast-feeding How should I use this medication? This medicine is injected into a vein. It is given by a health care provider in a hospital or clinic setting. Talk to your health care provider about the use of this medicine in children. Special care may be needed. Overdosage: If you think you have taken too much of this medicine contact a poison control center or emergency room at once. NOTE: This medicine is only for you. Do not share this medicine with others. What if I miss a dose? Keep appointments for follow-up doses. It is important not to miss your dose. Call your doctor or health care provider if you are unable to keep an appointment. What may interact with this medication? This medicine may also interact with the following medications: certain antivirals for HIV certain medicines for fungal infections like ketoconazole, itraconazole, or posaconazole clarithromycin grapefruit juice mifepristone telithromycin This list may not describe all possible interactions. Give your health care provider a list of all the medicines, herbs, non-prescription drugs, or dietary supplements you use. Also tell them if you smoke, drink alcohol, or use illegal drugs. Some items may interact with your medicine. What should I watch for while using this medication? This medicine may make you feel generally unwell. This is not uncommon as chemotherapy can affect healthy cells as well as cancer cells. Report any side effects. Continue your course of treatment even though you feel ill unless your health care provider tells you to stop. Your condition will be monitored carefully while you are receiving this medicine. Do not become pregnant while taking this medicine or for 2 months after stopping it. Women should inform their health care provider if they wish to become pregnant or think they might be pregnant. Men should not  father a child while taking this medicine and for 4 months after stopping it. There is potential for serious side effects to an unborn child. Talk to your health care provider for more information. Do not breast-feed an infant while taking this medicine or for at least 3 weeks after stopping it. This medicine may make it more difficult to father a child. Talk to your health care provider if you are concerned about your fertility. This medicine may increase blood sugar. Ask your health care provider if changes in diet or medicines are needed if you have diabetes. This medicine can cause a serious condition in which there is too much acid in your blood. If you develop nausea, vomiting, stomach pain, unusual tiredness, or breathing problems, stop taking this medicine and call your health care provider right away. If possible, use a ketone dipstick to check for ketones in your urine. This medicine may cause dry eyes and blurred vision. If you wear contact lenses, you may feel some discomfort. Lubricating eye drops may help. See your health care provider if the problem does not go away or is severe. Tell your health care provider right away if you have any change in your eyesight. This medicine may increase your risk of getting an infection. Call your health care provider for advice if you get a  fever, chills, or sore throat, or other symptoms of a cold or flu. Do not treat yourself. Try to avoid being around people who are sick. This medicine may cause serious skin reactions. They can happen weeks to months after starting the medicine. Contact your health care provider right away if you notice fevers or flu-like symptoms with a rash. The rash may be red or purple and then turn into blisters or peeling of the skin. Or, you might notice a red rash with swelling of the face, lips or lymph nodes in your neck or under your arms. What side effects may I notice from receiving this medication? Side effects that you  should report to your doctor or health care professional as soon as possible: allergic reactions (skin rash, itching or hives; swelling of the face, lips, or tongue) blurred vision changes in vision cough dry eyes high blood sugar (increased hunger, thirst or urination; unusually weak or tired, blurry vision) infection (fever, chills, cough, sore throat, pain or trouble passing urine) pain, tingling, numbness in the hands or feet redness, blistering, peeling, bleeding, swelling, or loosening of the skin on the palms of your hands or soles of your feet or inside the mouth trouble breathing Side effects that usually do not require medical attention (report these to your doctor or health care professional if they continue or are bothersome): changes in taste diarrhea dry skin hair loss loss of appetite nausea, vomiting weak or tired This list may not describe all possible side effects. Call your doctor for medical advice about side effects. You may report side effects to FDA at 1-800-FDA-1088. Where should I keep my medication? This medicine is given in a hospital or clinic and will not be stored at home. NOTE: This sheet is a summary. It may not cover all possible information. If you have questions about this medicine, talk to your doctor, pharmacist, or health care provider.  2023 Elsevier/Gold Standard (2019-12-08 00:00:00)

## 2021-11-15 ENCOUNTER — Telehealth: Payer: Self-pay

## 2021-11-15 NOTE — Telephone Encounter (Signed)
-----   Message from Fara Boros, RN sent at 11/14/2021  2:35 PM EDT ----- Regarding[facility]v today for the [facility] time. No issues with treatment patient tolerated well. Seen by Dr Alen Blew.

## 2021-11-15 NOTE — Telephone Encounter (Signed)
Samuel [name] states that her husdand has done fine since treatment yesterday.  She drained his urine bags twice last night.  He is drinking and eating well.  Wife knows to call the office if she has any questions or concerns later today as her husband is still sleeping at this time.

## 2021-11-20 MED FILL — Dexamethasone Sodium Phosphate Inj 100 MG/10ML: INTRAMUSCULAR | Qty: 1 | Status: AC

## 2021-11-21 ENCOUNTER — Inpatient Hospital Stay: Payer: Medicare Other

## 2021-11-21 ENCOUNTER — Other Ambulatory Visit: Payer: Self-pay

## 2021-11-21 VITALS — BP 113/67 | HR 78 | Temp 98.6°F | Resp 18 | Wt 142.5 lb

## 2021-11-21 DIAGNOSIS — Z5111 Encounter for antineoplastic chemotherapy: Secondary | ICD-10-CM | POA: Diagnosis not present

## 2021-11-21 DIAGNOSIS — R599 Enlarged lymph nodes, unspecified: Secondary | ICD-10-CM | POA: Diagnosis not present

## 2021-11-21 DIAGNOSIS — C679 Malignant neoplasm of bladder, unspecified: Secondary | ICD-10-CM

## 2021-11-21 DIAGNOSIS — D630 Anemia in neoplastic disease: Secondary | ICD-10-CM | POA: Diagnosis not present

## 2021-11-21 DIAGNOSIS — M898X9 Other specified disorders of bone, unspecified site: Secondary | ICD-10-CM | POA: Diagnosis not present

## 2021-11-21 DIAGNOSIS — Z79899 Other long term (current) drug therapy: Secondary | ICD-10-CM | POA: Diagnosis not present

## 2021-11-21 DIAGNOSIS — N189 Chronic kidney disease, unspecified: Secondary | ICD-10-CM | POA: Diagnosis not present

## 2021-11-21 LAB — CBC WITH DIFFERENTIAL (CANCER CENTER ONLY)
Abs Immature Granulocytes: 0.07 10*3/uL (ref 0.00–0.07)
Basophils Absolute: 0 10*3/uL (ref 0.0–0.1)
Basophils Relative: 0 %
Eosinophils Absolute: 0.2 10*3/uL (ref 0.0–0.5)
Eosinophils Relative: 3 %
HCT: 34.3 % — ABNORMAL LOW (ref 39.0–52.0)
Hemoglobin: 11.1 g/dL — ABNORMAL LOW (ref 13.0–17.0)
Immature Granulocytes: 1 %
Lymphocytes Relative: 9 %
Lymphs Abs: 0.8 10*3/uL (ref 0.7–4.0)
MCH: 28.2 pg (ref 26.0–34.0)
MCHC: 32.4 g/dL (ref 30.0–36.0)
MCV: 87.1 fL (ref 80.0–100.0)
Monocytes Absolute: 0.9 10*3/uL (ref 0.1–1.0)
Monocytes Relative: 10 %
Neutro Abs: 7.3 10*3/uL (ref 1.7–7.7)
Neutrophils Relative %: 77 %
Platelet Count: 427 10*3/uL — ABNORMAL HIGH (ref 150–400)
RBC: 3.94 MIL/uL — ABNORMAL LOW (ref 4.22–5.81)
RDW: 15.5 % (ref 11.5–15.5)
WBC Count: 9.3 10*3/uL (ref 4.0–10.5)
nRBC: 0 % (ref 0.0–0.2)

## 2021-11-21 LAB — TSH: TSH: 3.794 u[IU]/mL (ref 0.350–4.500)

## 2021-11-21 LAB — CMP (CANCER CENTER ONLY)
ALT: 14 U/L (ref 0–44)
AST: 17 U/L (ref 15–41)
Albumin: 3.7 g/dL (ref 3.5–5.0)
Alkaline Phosphatase: 155 U/L — ABNORMAL HIGH (ref 38–126)
Anion gap: 9 (ref 5–15)
BUN: 31 mg/dL — ABNORMAL HIGH (ref 8–23)
CO2: 26 mmol/L (ref 22–32)
Calcium: 9.3 mg/dL (ref 8.9–10.3)
Chloride: 105 mmol/L (ref 98–111)
Creatinine: 1.97 mg/dL — ABNORMAL HIGH (ref 0.61–1.24)
GFR, Estimated: 36 mL/min — ABNORMAL LOW (ref >60)
Glucose, Bld: 95 mg/dL (ref 70–99)
Potassium: 4.1 mmol/L (ref 3.5–5.1)
Sodium: 140 mmol/L (ref 135–145)
Total Bilirubin: 0.4 mg/dL (ref 0.3–1.2)
Total Protein: 7.1 g/dL (ref 6.5–8.1)

## 2021-11-21 MED ORDER — SODIUM CHLORIDE 0.9 % IV SOLN: Freq: Once | INTRAVENOUS | Status: AC

## 2021-11-21 MED ORDER — SODIUM CHLORIDE 0.9% FLUSH
10.0000 mL | INTRAVENOUS | Status: DC | PRN
  Administered 2021-11-21: 10 mL

## 2021-11-21 MED ORDER — PALONOSETRON HCL INJECTION 0.25 MG/5ML
0.2500 mg | Freq: Once | INTRAVENOUS | Status: AC
  Administered 2021-11-21: 0.25 mg via INTRAVENOUS
  Filled 2021-11-21: qty 5

## 2021-11-21 MED ORDER — SODIUM CHLORIDE 0.9 % IV SOLN
0.9200 mg/kg | Freq: Once | INTRAVENOUS | Status: AC
  Administered 2021-11-21: 60 mg via INTRAVENOUS
  Filled 2021-11-21: qty 4

## 2021-11-21 MED ORDER — SODIUM CHLORIDE 0.9 % IV SOLN
10.0000 mg | Freq: Once | INTRAVENOUS | Status: AC
  Administered 2021-11-21: 10 mg via INTRAVENOUS
  Filled 2021-11-21: qty 10

## 2021-11-21 MED ORDER — HEPARIN SOD (PORK) LOCK FLUSH 100 UNIT/ML IV SOLN
500.0000 [IU] | Freq: Once | INTRAVENOUS | Status: AC | PRN
  Administered 2021-11-21: 500 [IU]

## 2021-11-21 NOTE — Progress Notes (Signed)
Per Dr. Alen Blew okay to treat with creatinine 1.97

## 2021-11-21 NOTE — Patient Instructions (Signed)
[facility]  Discharge Instructions: Thank you for choosing [facility] to provide your oncology and hematology care.   If you have a lab appointment with the [facility], please go directly to the [facility] and check in at the registration area.   Wear comfortable clothing and clothing appropriate for easy access to any Portacath or PICC line.   We strive to give you quality time with your provider. You may need to reschedule your appointment if you arrive late (15 or more minutes).  Arriving late affects you and other patients whose appointments are after yours.  Also, if you miss three or more appointments without notifying the office, you may be dismissed from the clinic at the provider's discretion.      For prescription refill requests, have your pharmacy contact our office and allow 72 hours for refills to be completed.    Today you received the following chemotherapy and/or immunotherapy agents: Padcev      To help prevent nausea and vomiting after your treatment, we encourage you to take your nausea medication as directed.  BELOW ARE SYMPTOMS THAT SHOULD BE REPORTED IMMEDIATELY: *FEVER GREATER THAN 100.4 F (38 C) OR HIGHER *CHILLS OR SWEATING *NAUSEA AND VOMITING THAT IS NOT CONTROLLED WITH YOUR NAUSEA MEDICATION *UNUSUAL SHORTNESS OF BREATH *UNUSUAL BRUISING OR BLEEDING *URINARY PROBLEMS (pain or burning when urinating, or frequent urination) *BOWEL PROBLEMS (unusual diarrhea, constipation, pain near the anus) TENDERNESS IN MOUTH AND THROAT WITH OR WITHOUT PRESENCE OF ULCERS (sore throat, sores in mouth, or a toothache) UNUSUAL RASH, SWELLING OR PAIN  UNUSUAL VAGINAL DISCHARGE OR ITCHING   Items with * indicate a potential emergency and should be followed up as soon as possible or go to the Emergency Department if any problems should occur.  Please show the CHEMOTHERAPY ALERT CARD or IMMUNOTHERAPY ALERT CARD at check-in to the  Emergency Department and triage nurse.  Should you have questions after your visit or need to cancel or reschedule your appointment, please contact [facility]  Dept: [phone]  and follow the prompts.  Office hours are 8:00 a.m. to 4:30 p.m. Monday - Friday. Please note that voicemails left after 4:00 p.m. may not be returned until the following business day.  We are closed weekends and major holidays. You have access to a nurse at all times for urgent questions. Please call the main number to the clinic Dept: [phone] and follow the prompts.   For any non-urgent questions, you may also contact your provider using MyChart. We now offer e-Visits for anyone 48 and older to request care online for non-urgent symptoms. For details visit [email or web address].   Also download the MyChart app! Go to the app store, search "MyChart", open the app, select [facility], and log in with your MyChart username and password.  Masks are optional in the cancer centers. If you would like for your care team to wear a mask while they are taking care of you, please let them know. For doctor visits, patients may have with them one support Corion Sherrod who is at least 72 years old. At this time, visitors are not allowed in the infusion area.    Enfortumab vedotin injection What is this medication? ENFORTUMAB VEDOTIN (en FORT ue mab ve DOE tin) is a chemotherapy medicine and monoclonal antibody. It treats urothelial cancer. This medicine may be used for other purposes; ask your health care provider or pharmacist if you have questions. COMMON BRAND  NAME(S): PADCEV What should I tell my care team before I take this medication? They need to know if you have any of these conditions: diabetes (high blood sugar) eye disease, vision problems lung disease skin conditions or sensitivity tingling of the fingers or toes, or other nerve disorder an unusual or allergic reaction to enfortumab  vedotin, other medicines, foods, dyes or preservatives pregnant or trying to get pregnant breast-feeding How should I use this medication? This medicine is injected into a vein. It is given by a health care provider in a hospital or clinic setting. Talk to your health care provider about the use of this medicine in children. Special care may be needed. Overdosage: If you think you have taken too much of this medicine contact a poison control center or emergency room at once. NOTE: This medicine is only for you. Do not share this medicine with others. What if I miss a dose? Keep appointments for follow-up doses. It is important not to miss your dose. Call your doctor or health care provider if you are unable to keep an appointment. What may interact with this medication? This medicine may also interact with the following medications: certain antivirals for HIV certain medicines for fungal infections like ketoconazole, itraconazole, or posaconazole clarithromycin grapefruit juice mifepristone telithromycin This list may not describe all possible interactions. Give your health care provider a list of all the medicines, herbs, non-prescription drugs, or dietary supplements you use. Also tell them if you smoke, drink alcohol, or use illegal drugs. Some items may interact with your medicine. What should I watch for while using this medication? This medicine may make you feel generally unwell. This is not uncommon as chemotherapy can affect healthy cells as well as cancer cells. Report any side effects. Continue your course of treatment even though you feel ill unless your health care provider tells you to stop. Your condition will be monitored carefully while you are receiving this medicine. Do not become pregnant while taking this medicine or for 2 months after stopping it. Women should inform their health care provider if they wish to become pregnant or think they might be pregnant. Men should not  father a child while taking this medicine and for 4 months after stopping it. There is potential for serious side effects to an unborn child. Talk to your health care provider for more information. Do not breast-feed an infant while taking this medicine or for at least 3 weeks after stopping it. This medicine may make it more difficult to father a child. Talk to your health care provider if you are concerned about your fertility. This medicine may increase blood sugar. Ask your health care provider if changes in diet or medicines are needed if you have diabetes. This medicine can cause a serious condition in which there is too much acid in your blood. If you develop nausea, vomiting, stomach pain, unusual tiredness, or breathing problems, stop taking this medicine and call your health care provider right away. If possible, use a ketone dipstick to check for ketones in your urine. This medicine may cause dry eyes and blurred vision. If you wear contact lenses, you may feel some discomfort. Lubricating eye drops may help. See your health care provider if the problem does not go away or is severe. Tell your health care provider right away if you have any change in your eyesight. This medicine may increase your risk of getting an infection. Call your health care provider for advice if you get a  fever, chills, or sore throat, or other symptoms of a cold or flu. Do not treat yourself. Try to avoid being around people who are sick. This medicine may cause serious skin reactions. They can happen weeks to months after starting the medicine. Contact your health care provider right away if you notice fevers or flu-like symptoms with a rash. The rash may be red or purple and then turn into blisters or peeling of the skin. Or, you might notice a red rash with swelling of the face, lips or lymph nodes in your neck or under your arms. What side effects may I notice from receiving this medication? Side effects that you  should report to your doctor or health care professional as soon as possible: allergic reactions (skin rash, itching or hives; swelling of the face, lips, or tongue) blurred vision changes in vision cough dry eyes high blood sugar (increased hunger, thirst or urination; unusually weak or tired, blurry vision) infection (fever, chills, cough, sore throat, pain or trouble passing urine) pain, tingling, numbness in the hands or feet redness, blistering, peeling, bleeding, swelling, or loosening of the skin on the palms of your hands or soles of your feet or inside the mouth trouble breathing Side effects that usually do not require medical attention (report these to your doctor or health care professional if they continue or are bothersome): changes in taste diarrhea dry skin hair loss loss of appetite nausea, vomiting weak or tired This list may not describe all possible side effects. Call your doctor for medical advice about side effects. You may report side effects to FDA at 1-800-FDA-1088. Where should I keep my medication? This medicine is given in a hospital or clinic and will not be stored at home. NOTE: This sheet is a summary. It may not cover all possible information. If you have questions about this medicine, talk to your doctor, pharmacist, or health care provider.  2023 Elsevier/Gold Standard (2019-12-08 00:00:00)

## 2021-11-22 DIAGNOSIS — N2581 Secondary hyperparathyroidism of renal origin: Secondary | ICD-10-CM | POA: Diagnosis not present

## 2021-11-22 DIAGNOSIS — N1832 Chronic kidney disease, stage 3b: Secondary | ICD-10-CM | POA: Diagnosis not present

## 2021-11-22 DIAGNOSIS — I129 Hypertensive chronic kidney disease with stage 1 through stage 4 chronic kidney disease, or unspecified chronic kidney disease: Secondary | ICD-10-CM | POA: Diagnosis not present

## 2021-11-22 DIAGNOSIS — D631 Anemia in chronic kidney disease: Secondary | ICD-10-CM | POA: Diagnosis not present

## 2021-11-22 DIAGNOSIS — J449 Chronic obstructive pulmonary disease, unspecified: Secondary | ICD-10-CM | POA: Diagnosis not present

## 2021-11-22 DIAGNOSIS — C679 Malignant neoplasm of bladder, unspecified: Secondary | ICD-10-CM | POA: Diagnosis not present

## 2021-11-27 MED FILL — Dexamethasone Sodium Phosphate Inj 100 MG/10ML: INTRAMUSCULAR | Qty: 1 | Status: AC

## 2021-11-28 ENCOUNTER — Inpatient Hospital Stay: Payer: Medicare Other

## 2021-11-28 ENCOUNTER — Other Ambulatory Visit: Payer: Self-pay

## 2021-11-28 VITALS — BP 113/61 | HR 79 | Temp 98.3°F | Resp 18 | Wt 142.8 lb

## 2021-11-28 DIAGNOSIS — M898X9 Other specified disorders of bone, unspecified site: Secondary | ICD-10-CM | POA: Diagnosis not present

## 2021-11-28 DIAGNOSIS — Z5111 Encounter for antineoplastic chemotherapy: Secondary | ICD-10-CM | POA: Diagnosis not present

## 2021-11-28 DIAGNOSIS — C679 Malignant neoplasm of bladder, unspecified: Secondary | ICD-10-CM | POA: Diagnosis not present

## 2021-11-28 DIAGNOSIS — Z95828 Presence of other vascular implants and grafts: Secondary | ICD-10-CM

## 2021-11-28 DIAGNOSIS — Z79899 Other long term (current) drug therapy: Secondary | ICD-10-CM | POA: Diagnosis not present

## 2021-11-28 DIAGNOSIS — R599 Enlarged lymph nodes, unspecified: Secondary | ICD-10-CM | POA: Diagnosis not present

## 2021-11-28 DIAGNOSIS — D630 Anemia in neoplastic disease: Secondary | ICD-10-CM | POA: Diagnosis not present

## 2021-11-28 DIAGNOSIS — N189 Chronic kidney disease, unspecified: Secondary | ICD-10-CM | POA: Diagnosis not present

## 2021-11-28 LAB — CBC WITH DIFFERENTIAL (CANCER CENTER ONLY)
Abs Immature Granulocytes: 0.09 10*3/uL — ABNORMAL HIGH (ref 0.00–0.07)
Basophils Absolute: 0 10*3/uL (ref 0.0–0.1)
Basophils Relative: 0 %
Eosinophils Absolute: 0.3 10*3/uL (ref 0.0–0.5)
Eosinophils Relative: 4 %
HCT: 33.3 % — ABNORMAL LOW (ref 39.0–52.0)
Hemoglobin: 10.8 g/dL — ABNORMAL LOW (ref 13.0–17.0)
Immature Granulocytes: 1 %
Lymphocytes Relative: 9 %
Lymphs Abs: 0.9 10*3/uL (ref 0.7–4.0)
MCH: 28.5 pg (ref 26.0–34.0)
MCHC: 32.4 g/dL (ref 30.0–36.0)
MCV: 87.9 fL (ref 80.0–100.0)
Monocytes Absolute: 0.8 10*3/uL (ref 0.1–1.0)
Monocytes Relative: 8 %
Neutro Abs: 7.7 10*3/uL (ref 1.7–7.7)
Neutrophils Relative %: 78 %
Platelet Count: 385 10*3/uL (ref 150–400)
RBC: 3.79 MIL/uL — ABNORMAL LOW (ref 4.22–5.81)
RDW: 15.8 % — ABNORMAL HIGH (ref 11.5–15.5)
WBC Count: 9.8 10*3/uL (ref 4.0–10.5)
nRBC: 0 % (ref 0.0–0.2)

## 2021-11-28 LAB — CMP (CANCER CENTER ONLY)
ALT: 10 U/L (ref 0–44)
AST: 12 U/L — ABNORMAL LOW (ref 15–41)
Albumin: 3.5 g/dL (ref 3.5–5.0)
Alkaline Phosphatase: 149 U/L — ABNORMAL HIGH (ref 38–126)
Anion gap: 7 (ref 5–15)
BUN: 27 mg/dL — ABNORMAL HIGH (ref 8–23)
CO2: 26 mmol/L (ref 22–32)
Calcium: 8.8 mg/dL — ABNORMAL LOW (ref 8.9–10.3)
Chloride: 106 mmol/L (ref 98–111)
Creatinine: 1.87 mg/dL — ABNORMAL HIGH (ref 0.61–1.24)
GFR, Estimated: 38 mL/min — ABNORMAL LOW (ref >60)
Glucose, Bld: 146 mg/dL — ABNORMAL HIGH (ref 70–99)
Potassium: 3.8 mmol/L (ref 3.5–5.1)
Sodium: 139 mmol/L (ref 135–145)
Total Bilirubin: 0.3 mg/dL (ref 0.3–1.2)
Total Protein: 6.4 g/dL — ABNORMAL LOW (ref 6.5–8.1)

## 2021-11-28 LAB — TSH: TSH: 3.205 u[IU]/mL (ref 0.350–4.500)

## 2021-11-28 MED ORDER — SODIUM CHLORIDE 0.9% FLUSH
10.0000 mL | Freq: Once | INTRAVENOUS | Status: AC
  Administered 2021-11-28: 10 mL

## 2021-11-28 MED ORDER — SODIUM CHLORIDE 0.9 % IV SOLN
0.9200 mg/kg | Freq: Once | INTRAVENOUS | Status: AC
  Administered 2021-11-28: 60 mg via INTRAVENOUS
  Filled 2021-11-28: qty 6

## 2021-11-28 MED ORDER — SODIUM CHLORIDE 0.9% FLUSH
10.0000 mL | INTRAVENOUS | Status: DC | PRN
  Administered 2021-11-28: 10 mL

## 2021-11-28 MED ORDER — PALONOSETRON HCL INJECTION 0.25 MG/5ML
0.2500 mg | Freq: Once | INTRAVENOUS | Status: AC
  Administered 2021-11-28: 0.25 mg via INTRAVENOUS
  Filled 2021-11-28: qty 5

## 2021-11-28 MED ORDER — SODIUM CHLORIDE 0.9 % IV SOLN
10.0000 mg | Freq: Once | INTRAVENOUS | Status: AC
  Administered 2021-11-28: 10 mg via INTRAVENOUS
  Filled 2021-11-28: qty 10

## 2021-11-28 MED ORDER — HEPARIN SOD (PORK) LOCK FLUSH 100 UNIT/ML IV SOLN
500.0000 [IU] | Freq: Once | INTRAVENOUS | Status: AC | PRN
  Administered 2021-11-28: 500 [IU]

## 2021-11-28 MED ORDER — SODIUM CHLORIDE 0.9 % IV SOLN: Freq: Once | INTRAVENOUS | Status: AC

## 2021-11-28 NOTE — Progress Notes (Signed)
Per Dr. Alen Blew, okay to treat with creatinine 1.87.

## 2021-11-28 NOTE — Patient Instructions (Signed)
[name]  Discharge Instructions: Thank you for choosing [facility] to provide your oncology and hematology care.   If you have a lab appointment with the [facility], please go directly to the [facility] and check in at the registration area.   Wear comfortable clothing and clothing appropriate for easy access to any Portacath or PICC line.   We strive to give you quality time with your provider. You may need to reschedule your appointment if you arrive late (15 or more minutes).  Arriving late affects you and other patients whose appointments are after yours.  Also, if you miss three or more appointments without notifying the office, you may be dismissed from the clinic at the provider's discretion.      For prescription refill requests, have your pharmacy contact our office and allow 72 hours for refills to be completed.    Today you received the following chemotherapy and/or immunotherapy agents: Padcev      To help prevent nausea and vomiting after your treatment, we encourage you to take your nausea medication as directed.  BELOW ARE SYMPTOMS THAT SHOULD BE REPORTED IMMEDIATELY: *FEVER GREATER THAN 100.4 F (38 C) OR HIGHER *CHILLS OR SWEATING *NAUSEA AND VOMITING THAT IS NOT CONTROLLED WITH YOUR NAUSEA MEDICATION *UNUSUAL SHORTNESS OF BREATH *UNUSUAL BRUISING OR BLEEDING *URINARY PROBLEMS (pain or burning when urinating, or frequent urination) *BOWEL PROBLEMS (unusual diarrhea, constipation, pain near the anus) TENDERNESS IN MOUTH AND THROAT WITH OR WITHOUT PRESENCE OF ULCERS (sore throat, sores in mouth, or a toothache) UNUSUAL RASH, SWELLING OR PAIN  UNUSUAL VAGINAL DISCHARGE OR ITCHING   Items with * indicate a potential emergency and should be followed up as soon as possible or go to the Emergency Department if any problems should occur.  Please show the CHEMOTHERAPY ALERT CARD or IMMUNOTHERAPY ALERT CARD at check-in to the  Emergency Department and triage nurse.  Should you have questions after your visit or need to cancel or reschedule your appointment, please contact [facility]  Dept: [phone]  and follow the prompts.  Office hours are 8:00 a.m. to 4:30 p.m. Monday - Friday. Please note that voicemails left after 4:00 p.m. may not be returned until the following business day.  We are closed weekends and major holidays. You have access to a nurse at all times for urgent questions. Please call the main number to the clinic Dept: [phone] and follow the prompts.   For any non-urgent questions, you may also contact your provider using MyChart. We now offer e-Visits for anyone 72 and older to request care online for non-urgent symptoms. For details visit [email or web address].   Also download the MyChart app! Go to the app store, search "MyChart", open the app, select [name], and log in with your MyChart username and password.  Masks are optional in the cancer centers. If you would like for your care team to wear a mask while they are taking care of you, please let them know. For doctor visits, patients may have with them one support person who is at least 72 years old. At this time, visitors are not allowed in the infusion area.    Enfortumab vedotin injection What is this medication? ENFORTUMAB VEDOTIN (en FORT ue mab ve DOE tin) is a chemotherapy medicine and monoclonal antibody. It treats urothelial cancer. This medicine may be used for other purposes; ask your health care provider or pharmacist if you have questions. COMMON BRAND  NAME(S): PADCEV What should I tell my care team before I take this medication? They need to know if you have any of these conditions: diabetes (high blood sugar) eye disease, vision problems lung disease skin conditions or sensitivity tingling of the fingers or toes, or other nerve disorder an unusual or allergic reaction to enfortumab  vedotin, other medicines, foods, dyes or preservatives pregnant or trying to get pregnant breast-feeding How should I use this medication? This medicine is injected into a vein. It is given by a health care provider in a hospital or clinic setting. Talk to your health care provider about the use of this medicine in children. Special care may be needed. Overdosage: If you think you have taken too much of this medicine contact a poison control center or emergency room at once. NOTE: This medicine is only for you. Do not share this medicine with others. What if I miss a dose? Keep appointments for follow-up doses. It is important not to miss your dose. Call your doctor or health care provider if you are unable to keep an appointment. What may interact with this medication? This medicine may also interact with the following medications: certain antivirals for HIV certain medicines for fungal infections like ketoconazole, itraconazole, or posaconazole clarithromycin grapefruit juice mifepristone telithromycin This list may not describe all possible interactions. Give your health care provider a list of all the medicines, herbs, non-prescription drugs, or dietary supplements you use. Also tell them if you smoke, drink alcohol, or use illegal drugs. Some items may interact with your medicine. What should I watch for while using this medication? This medicine may make you feel generally unwell. This is not uncommon as chemotherapy can affect healthy cells as well as cancer cells. Report any side effects. Continue your course of treatment even though you feel ill unless your health care provider tells you to stop. Your condition will be monitored carefully while you are receiving this medicine. Do not become pregnant while taking this medicine or for 2 months after stopping it. Women should inform their health care provider if they wish to become pregnant or think they might be pregnant. Men should not  father a child while taking this medicine and for 4 months after stopping it. There is potential for serious side effects to an unborn child. Talk to your health care provider for more information. Do not breast-feed an infant while taking this medicine or for at least 3 weeks after stopping it. This medicine may make it more difficult to father a child. Talk to your health care provider if you are concerned about your fertility. This medicine may increase blood sugar. Ask your health care provider if changes in diet or medicines are needed if you have diabetes. This medicine can cause a serious condition in which there is too much acid in your blood. If you develop nausea, vomiting, stomach pain, unusual tiredness, or breathing problems, stop taking this medicine and call your health care provider right away. If possible, use a ketone dipstick to check for ketones in your urine. This medicine may cause dry eyes and blurred vision. If you wear contact lenses, you may feel some discomfort. Lubricating eye drops may help. See your health care provider if the problem does not go away or is severe. Tell your health care provider right away if you have any change in your eyesight. This medicine may increase your risk of getting an infection. Call your health care provider for advice if you get a  fever, chills, or sore throat, or other symptoms of a cold or flu. Do not treat yourself. Try to avoid being around people who are sick. This medicine may cause serious skin reactions. They can happen weeks to months after starting the medicine. Contact your health care provider right away if you notice fevers or flu-like symptoms with a rash. The rash may be red or purple and then turn into blisters or peeling of the skin. Or, you might notice a red rash with swelling of the face, lips or lymph nodes in your neck or under your arms. What side effects may I notice from receiving this medication? Side effects that you  should report to your doctor or health care professional as soon as possible: allergic reactions (skin rash, itching or hives; swelling of the face, lips, or tongue) blurred vision changes in vision cough dry eyes high blood sugar (increased hunger, thirst or urination; unusually weak or tired, blurry vision) infection (fever, chills, cough, sore throat, pain or trouble passing urine) pain, tingling, numbness in the hands or feet redness, blistering, peeling, bleeding, swelling, or loosening of the skin on the palms of your hands or soles of your feet or inside the mouth trouble breathing Side effects that usually do not require medical attention (report these to your doctor or health care professional if they continue or are bothersome): changes in taste diarrhea dry skin hair loss loss of appetite nausea, vomiting weak or tired This list may not describe all possible side effects. Call your doctor for medical advice about side effects. You may report side effects to FDA at 1-800-FDA-1088. Where should I keep my medication? This medicine is given in a hospital or clinic and will not be stored at home. NOTE: This sheet is a summary. It may not cover all possible information. If you have questions about this medicine, talk to your doctor, pharmacist, or health care provider.  2023 Elsevier/Gold Standard (2019-12-08 00:00:00)

## 2021-12-03 ENCOUNTER — Other Ambulatory Visit: Payer: Self-pay

## 2021-12-04 ENCOUNTER — Other Ambulatory Visit (HOSPITAL_COMMUNITY): Payer: Self-pay | Admitting: Interventional Radiology

## 2021-12-04 ENCOUNTER — Ambulatory Visit (HOSPITAL_COMMUNITY)
Admission: RE | Admit: 2021-12-04 | Discharge: 2021-12-04 | Disposition: A | Discharge status: Home / Self Care | Payer: Medicare Other | Source: Ambulatory Visit | Attending: Interventional Radiology | Admitting: Interventional Radiology

## 2021-12-04 DIAGNOSIS — C679 Malignant neoplasm of bladder, unspecified: Secondary | ICD-10-CM | POA: Diagnosis not present

## 2021-12-04 DIAGNOSIS — Z436 Encounter for attention to other artificial openings of urinary tract: Secondary | ICD-10-CM | POA: Diagnosis not present

## 2021-12-04 HISTORY — PX: IR NEPHROSTOMY EXCHANGE RIGHT: IMG6070

## 2021-12-04 HISTORY — PX: IR NEPHROSTOMY EXCHANGE LEFT: IMG6069

## 2021-12-04 MED ORDER — IOHEXOL 300 MG/ML  SOLN
50.0000 mL | Freq: Once | INTRAMUSCULAR | Status: AC | PRN
  Administered 2021-12-04: 20 mL

## 2021-12-04 NOTE — Procedures (Signed)
Interventional Radiology Procedure Note  Procedure: Bilateral PCN exchange  Indication: Bladder Ca  Findings: Please refer to procedural dictation for full description.  Complications: None  EBL: < 10 mL  Miachel Roux, MD [phone]

## 2021-12-11 ENCOUNTER — Other Ambulatory Visit: Payer: Self-pay

## 2021-12-11 MED FILL — Dexamethasone Sodium Phosphate Inj 100 MG/10ML: INTRAMUSCULAR | Qty: 1 | Status: AC

## 2021-12-12 ENCOUNTER — Inpatient Hospital Stay: Payer: Medicare Other

## 2021-12-12 ENCOUNTER — Inpatient Hospital Stay: Payer: Medicare Other | Attending: Oncology | Admitting: Oncology

## 2021-12-12 VITALS — BP 119/73 | HR 79 | Temp 97.8°F | Resp 16 | Wt 140.6 lb

## 2021-12-12 VITALS — BP 115/71 | HR 70 | Temp 97.7°F | Resp 16

## 2021-12-12 DIAGNOSIS — N189 Chronic kidney disease, unspecified: Secondary | ICD-10-CM | POA: Diagnosis not present

## 2021-12-12 DIAGNOSIS — J9611 Chronic respiratory failure with hypoxia: Secondary | ICD-10-CM | POA: Diagnosis not present

## 2021-12-12 DIAGNOSIS — Z95828 Presence of other vascular implants and grafts: Secondary | ICD-10-CM | POA: Diagnosis not present

## 2021-12-12 DIAGNOSIS — Z79899 Other long term (current) drug therapy: Secondary | ICD-10-CM | POA: Diagnosis not present

## 2021-12-12 DIAGNOSIS — D6481 Anemia due to antineoplastic chemotherapy: Secondary | ICD-10-CM | POA: Diagnosis not present

## 2021-12-12 DIAGNOSIS — R630 Anorexia: Secondary | ICD-10-CM | POA: Diagnosis not present

## 2021-12-12 DIAGNOSIS — Z5111 Encounter for antineoplastic chemotherapy: Secondary | ICD-10-CM | POA: Diagnosis not present

## 2021-12-12 DIAGNOSIS — C679 Malignant neoplasm of bladder, unspecified: Secondary | ICD-10-CM

## 2021-12-12 DIAGNOSIS — M898X9 Other specified disorders of bone, unspecified site: Secondary | ICD-10-CM | POA: Insufficient documentation

## 2021-12-12 DIAGNOSIS — D630 Anemia in neoplastic disease: Secondary | ICD-10-CM | POA: Diagnosis not present

## 2021-12-12 LAB — CMP (CANCER CENTER ONLY)
ALT: 8 U/L (ref 0–44)
AST: 13 U/L — ABNORMAL LOW (ref 15–41)
Albumin: 3.6 g/dL (ref 3.5–5.0)
Alkaline Phosphatase: 140 U/L — ABNORMAL HIGH (ref 38–126)
Anion gap: 8 (ref 5–15)
BUN: 23 mg/dL (ref 8–23)
CO2: 27 mmol/L (ref 22–32)
Calcium: 8.9 mg/dL (ref 8.9–10.3)
Chloride: 105 mmol/L (ref 98–111)
Creatinine: 2.06 mg/dL — ABNORMAL HIGH (ref 0.61–1.24)
GFR, Estimated: 34 mL/min — ABNORMAL LOW (ref >60)
Glucose, Bld: 101 mg/dL — ABNORMAL HIGH (ref 70–99)
Potassium: 4 mmol/L (ref 3.5–5.1)
Sodium: 140 mmol/L (ref 135–145)
Total Bilirubin: 0.4 mg/dL (ref 0.3–1.2)
Total Protein: 6.9 g/dL (ref 6.5–8.1)

## 2021-12-12 LAB — CBC WITH DIFFERENTIAL (CANCER CENTER ONLY)
Abs Immature Granulocytes: 0.05 10*3/uL (ref 0.00–0.07)
Basophils Absolute: 0.1 10*3/uL (ref 0.0–0.1)
Basophils Relative: 1 %
Eosinophils Absolute: 0.1 10*3/uL (ref 0.0–0.5)
Eosinophils Relative: 1 %
HCT: 34.2 % — ABNORMAL LOW (ref 39.0–52.0)
Hemoglobin: 10.9 g/dL — ABNORMAL LOW (ref 13.0–17.0)
Immature Granulocytes: 1 %
Lymphocytes Relative: 11 %
Lymphs Abs: 1 10*3/uL (ref 0.7–4.0)
MCH: 27.7 pg (ref 26.0–34.0)
MCHC: 31.9 g/dL (ref 30.0–36.0)
MCV: 87 fL (ref 80.0–100.0)
Monocytes Absolute: 1.1 10*3/uL — ABNORMAL HIGH (ref 0.1–1.0)
Monocytes Relative: 12 %
Neutro Abs: 6.6 10*3/uL (ref 1.7–7.7)
Neutrophils Relative %: 74 %
Platelet Count: 447 10*3/uL — ABNORMAL HIGH (ref 150–400)
RBC: 3.93 MIL/uL — ABNORMAL LOW (ref 4.22–5.81)
RDW: 16.5 % — ABNORMAL HIGH (ref 11.5–15.5)
WBC Count: 8.9 10*3/uL (ref 4.0–10.5)
nRBC: 0 % (ref 0.0–0.2)

## 2021-12-12 LAB — TSH: TSH: 2.356 u[IU]/mL (ref 0.350–4.500)

## 2021-12-12 MED ORDER — HEPARIN SOD (PORK) LOCK FLUSH 100 UNIT/ML IV SOLN
500.0000 [IU] | Freq: Once | INTRAVENOUS | Status: AC | PRN
  Administered 2021-12-12: 500 [IU]

## 2021-12-12 MED ORDER — PALONOSETRON HCL INJECTION 0.25 MG/5ML
0.2500 mg | Freq: Once | INTRAVENOUS | Status: AC
  Administered 2021-12-12: 0.25 mg via INTRAVENOUS
  Filled 2021-12-12: qty 5

## 2021-12-12 MED ORDER — SODIUM CHLORIDE 0.9 % IV SOLN
0.9200 mg/kg | Freq: Once | INTRAVENOUS | Status: AC
  Administered 2021-12-12: 60 mg via INTRAVENOUS
  Filled 2021-12-12: qty 6

## 2021-12-12 MED ORDER — SODIUM CHLORIDE 0.9% FLUSH
10.0000 mL | Freq: Once | INTRAVENOUS | Status: AC
  Administered 2021-12-12: 10 mL

## 2021-12-12 MED ORDER — SODIUM CHLORIDE 0.9 % IV SOLN: Freq: Once | INTRAVENOUS | Status: AC

## 2021-12-12 MED ORDER — SODIUM CHLORIDE 0.9 % IV SOLN
10.0000 mg | Freq: Once | INTRAVENOUS | Status: AC
  Administered 2021-12-12: 10 mg via INTRAVENOUS
  Filled 2021-12-12: qty 10

## 2021-12-12 MED ORDER — SODIUM CHLORIDE 0.9% FLUSH
10.0000 mL | INTRAVENOUS | Status: DC | PRN
  Administered 2021-12-12: 10 mL

## 2021-12-12 MED ORDER — OXYCODONE HCL 5 MG PO TABS: 5.0000 mg | ORAL_TABLET | Freq: Four times a day (QID) | ORAL | 0 refills | Status: DC | PRN

## 2021-12-12 NOTE — Progress Notes (Signed)
Hematology and Oncology Follow Up Visit  Samuel [name] [id number] [birth year] 71 y.o. 12/12/2021 8:45 AM Carlisle Cater, Augustine Radar, [name], NP   Principle Diagnosis: 71 year old with bladder cancer diagnosed in June 2022.  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    Prior Therapy:  He is status post BCG treatment and June 2022.  Carboplatin and gemcitabine started on June 20, 2021.   He completed 3 cycles of therapy in March 2023.  Pembrolizumab 200 mg every 3 weeks started on August 22, 2021.   He completed 4 cycles of therapy.  Current therapy: Padcev 1 mg/kg cycle 1 started on November 14, 2021.  He is here for day 1 cycle 2  Interim History: Mr. Stick returns today for repeat evaluation.  Since last visit, he reports feeling well without any major complaints.  He denies any nausea, vomiting or abdominal pain.  He denies any hospitalizations or illnesses.  He denies any complications related to Padcev.  His appetite has declined slightly but overall his performance status has remained stable.     Medications: Updated on review. Current Outpatient Medications  Medication Sig Dispense Refill   acetaminophen (TYLENOL) 325 MG tablet Take 325 mg by mouth every 6 (six) hours as needed for moderate pain or fever.     albuterol (PROVENTIL) (2.5 MG/3ML) 0.083% nebulizer solution Take 2.5 mg by nebulization in the morning and at bedtime.     albuterol (VENTOLIN HFA) 108 (90 Base) MCG/ACT inhaler Inhale 2 puffs into the lungs every 6 (six) hours as needed for wheezing or shortness of breath. 8 g 6   amLODipine (NORVASC) 10 MG tablet Take 10 mg by mouth at bedtime.     aspirin EC 81 MG tablet Take 1 tablet (81 mg total) by mouth daily. Swallow whole. 90 tablet 3   atorvastatin (LIPITOR) 40 MG tablet Take 1 tablet (40 mg total) by mouth daily. 90 tablet 3   Budeson-Glycopyrrol-Formoterol (BREZTRI AEROSPHERE) 160-9-4.8  MCG/ACT AERO Inhale 2 puffs into the lungs in the morning and at bedtime. 11.8 g 11   Cholecalciferol (VITAMIN D3) 25 MCG (1000 UT) CAPS 2 capsules daily.     citalopram (CELEXA) 20 MG tablet Take 1 tablet (20 mg total) by mouth at bedtime. 90 tablet 1   Cyanocobalamin (B-12 PO) Take 1 capsule by mouth daily.     finasteride (PROSCAR) 5 MG tablet Take 5 mg by mouth daily.     furosemide (LASIX) 20 MG tablet Take 20 mg by mouth 3 (three) times a week.     lidocaine-prilocaine (EMLA) cream Apply 1 application topically as needed. 30 g 0   metoprolol succinate (TOPROL-XL) 25 MG 24 hr tablet Take 25 mg by mouth daily.     oxyCODONE (OXY IR/ROXICODONE) 5 MG immediate release tablet Take 1 tablet (5 mg total) by mouth every 6 (six) hours as needed for moderate pain or severe pain (Post-operatively). (Patient not taking: Reported on 09/25/2021) 25 tablet 0   OXYGEN Inhale 2 L into the lungs See admin instructions. 2L when taking a nap and at bedtime     prochlorperazine (COMPAZINE) 10 MG tablet TAKE 1 TABLET(10 MG) BY MOUTH EVERY 6 HOURS AS NEEDED FOR NAUSEA OR VOMITING (Patient not taking: Reported on 09/25/2021) 30 tablet 0   tamsulosin (FLOMAX) 0.4 MG CAPS capsule Take 2 capsules by mouth at bedtime.     No current facility-administered medications for this visit.     Allergies:  Allergies  Allergen Reactions   Penicillins Diarrhea and Nausea And Vomiting    Did it involve swelling of the face/tongue/throat, SOB, or low BP? No Did it involve sudden or severe rash/hives, skin peeling, or any reaction on the inside of your mouth or nose?  Did you need to seek medical attention at a hospital or doctor's office? No When did it last happen?    Several Years Ago   If all above answers are "NO", may proceed with cephalosporin use.        Physical Exam:     Blood pressure 119/73, pulse 79, temperature 97.8 F (36.6 C), temperature source Oral, resp. rate 16, weight 140 lb 9.6 oz (63.8 kg),  SpO2 96 %.    ECOG: 1   General appearance: Alert, awake without any distress. Head: Atraumatic without abnormalities Oropharynx: Without any thrush or ulcers. Eyes: No scleral icterus. Lymph nodes: No lymphadenopathy noted in the cervical, supraclavicular, or axillary nodes Heart:regular rate and rhythm, without any murmurs or gallops.   Lung: Clear to auscultation without any rhonchi, wheezes or dullness to percussion. Abdomin: Soft, nontender without any shifting dullness or ascites. Musculoskeletal: No clubbing or cyanosis. Neurological: No motor or sensory deficits. Skin: No rashes or lesions.  .      Lab Results: Lab Results  Component Value Date   WBC 9.8 11/28/2021   HGB 10.8 (L) 11/28/2021   HCT 33.3 (L) 11/28/2021   MCV 87.9 11/28/2021   PLT 385 11/28/2021   PSA 3.13 04/04/2020     Chemistry      Component Value Date/Time   NA 139 11/28/2021 1211   K 3.8 11/28/2021 1211   CL 106 11/28/2021 1211   CO2 26 11/28/2021 1211   BUN 27 (H) 11/28/2021 1211   CREATININE 1.87 (H) 11/28/2021 1211   CREATININE 2.14 (H) 04/04/2020 0748      Component Value Date/Time   CALCIUM 8.8 (L) 11/28/2021 1211   ALKPHOS 149 (H) 11/28/2021 1211   AST 12 (L) 11/28/2021 1211   ALT 10 11/28/2021 1211   BILITOT 0.3 11/28/2021 1211         Impression and Plan:   71 year old with:   1.  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  The natural course of this disease was discussed at this time and treatment choices were reviewed.  He is currently on Padcev and has completed cycle 1 of therapy.  Risks and benefits of proceeding with cycle 2 were discussed.  Complications that include nausea, vomiting, myelosuppression, neuropathy and hyperglycemia were reiterated.  The plan is to update his staging scans after cycle 3.  He is agreeable to proceed.   2.  IV access: Port-A-Cath will continue to be in use without any  issues.   3.  Antiemetics: No nausea or vomiting reported at this time.  Compazine is available to him.   4.  Chronic Kidney disease: Creatinine clearance remains stable at this time without any further decline.   5.  Goals of care: Therapy remains palliative although aggressive measures are warranted at this time.  6.  Anemia: Related to malignancy and chemotherapy.  Hemoglobin is adequate and does not require transfusion.  7.  Bone pain: He is using oxycodone and that pain is manageable at this time.  8.  Anorexia: We discussed strategies to boost his appetite and nutritional intake.  9.  Follow-up: We will continue to follow weekly for chemotherapy and MD follow-up and 4  weeks.   30  minutes were spent on this visit.  The time was dedicated to reviewing laboratory data, disease status update and outlining future plan of care review.     Zola Button, MD 8/2/20238:45 AM

## 2021-12-12 NOTE — Patient Instructions (Signed)
[facility]  Discharge Instructions: Thank you for choosing [facility] to provide your oncology and hematology care.   If you have a lab appointment with the [facility], please go directly to the [facility] and check in at the registration area.   Wear comfortable clothing and clothing appropriate for easy access to any Portacath or PICC line.   We strive to give you quality time with your provider. You may need to reschedule your appointment if you arrive late (15 or more minutes).  Arriving late affects you and other patients whose appointments are after yours.  Also, if you miss three or more appointments without notifying the office, you may be dismissed from the clinic at the provider's discretion.      For prescription refill requests, have your pharmacy contact our office and allow 72 hours for refills to be completed.    Today you received the following chemotherapy and/or immunotherapy agents: Padcev      To help prevent nausea and vomiting after your treatment, we encourage you to take your nausea medication as directed.  BELOW ARE SYMPTOMS THAT SHOULD BE REPORTED IMMEDIATELY: *FEVER GREATER THAN 100.4 F (38 C) OR HIGHER *CHILLS OR SWEATING *NAUSEA AND VOMITING THAT IS NOT CONTROLLED WITH YOUR NAUSEA MEDICATION *UNUSUAL SHORTNESS OF BREATH *UNUSUAL BRUISING OR BLEEDING *URINARY PROBLEMS (pain or burning when urinating, or frequent urination) *BOWEL PROBLEMS (unusual diarrhea, constipation, pain near the anus) TENDERNESS IN MOUTH AND THROAT WITH OR WITHOUT PRESENCE OF ULCERS (sore throat, sores in mouth, or a toothache) UNUSUAL RASH, SWELLING OR PAIN  UNUSUAL VAGINAL DISCHARGE OR ITCHING   Items with * indicate a potential emergency and should be followed up as soon as possible or go to the Emergency Department if any problems should occur.  Please show the CHEMOTHERAPY ALERT CARD or IMMUNOTHERAPY ALERT CARD at check-in to the  Emergency Department and triage nurse.  Should you have questions after your visit or need to cancel or reschedule your appointment, please contact [facility]  Dept: [phone]  and follow the prompts.  Office hours are 8:00 a.m. to 4:30 p.m. Monday - Friday. Please note that voicemails left after 4:00 p.m. may not be returned until the following business day.  We are closed weekends and major holidays. You have access to a nurse at all times for urgent questions. Please call the main number to the clinic Dept: [phone] and follow the prompts.   For any non-urgent questions, you may also contact your provider using MyChart. We now offer e-Visits for anyone 75 and older to request care online for non-urgent symptoms. For details visit [email or web address].   Also download the MyChart app! Go to the app store, search "MyChart", open the app, select [facility], and log in with your MyChart username and password.  Masks are optional in the cancer centers. If you would like for your care team to wear a mask while they are taking care of you, please let them know. For doctor visits, patients may have with them one support person who is at least 72 years old. At this time, visitors are not allowed in the infusion area.    Enfortumab vedotin injection What is this medication? ENFORTUMAB VEDOTIN (en FORT ue mab ve DOE tin) is a chemotherapy medicine and monoclonal antibody. It treats urothelial cancer. This medicine may be used for other purposes; ask your health care provider or pharmacist if you have questions. COMMON BRAND  NAME(S): PADCEV What should I tell my care team before I take this medication? They need to know if you have any of these conditions: diabetes (high blood sugar) eye disease, vision problems lung disease skin conditions or sensitivity tingling of the fingers or toes, or other nerve disorder an unusual or allergic reaction to enfortumab  vedotin, other medicines, foods, dyes or preservatives pregnant or trying to get pregnant breast-feeding How should I use this medication? This medicine is injected into a vein. It is given by a health care provider in a hospital or clinic setting. Talk to your health care provider about the use of this medicine in children. Special care may be needed. Overdosage: If you think you have taken too much of this medicine contact a poison control center or emergency room at once. NOTE: This medicine is only for you. Do not share this medicine with others. What if I miss a dose? Keep appointments for follow-up doses. It is important not to miss your dose. Call your doctor or health care provider if you are unable to keep an appointment. What may interact with this medication? This medicine may also interact with the following medications: certain antivirals for HIV certain medicines for fungal infections like ketoconazole, itraconazole, or posaconazole clarithromycin grapefruit juice mifepristone telithromycin This list may not describe all possible interactions. Give your health care provider a list of all the medicines, herbs, non-prescription drugs, or dietary supplements you use. Also tell them if you smoke, drink alcohol, or use illegal drugs. Some items may interact with your medicine. What should I watch for while using this medication? This medicine may make you feel generally unwell. This is not uncommon as chemotherapy can affect healthy cells as well as cancer cells. Report any side effects. Continue your course of treatment even though you feel ill unless your health care provider tells you to stop. Your condition will be monitored carefully while you are receiving this medicine. Do not become pregnant while taking this medicine or for 2 months after stopping it. Women should inform their health care provider if they wish to become pregnant or think they might be pregnant. Men should not  father a child while taking this medicine and for 4 months after stopping it. There is potential for serious side effects to an unborn child. Talk to your health care provider for more information. Do not breast-feed an infant while taking this medicine or for at least 3 weeks after stopping it. This medicine may make it more difficult to father a child. Talk to your health care provider if you are concerned about your fertility. This medicine may increase blood sugar. Ask your health care provider if changes in diet or medicines are needed if you have diabetes. This medicine can cause a serious condition in which there is too much acid in your blood. If you develop nausea, vomiting, stomach pain, unusual tiredness, or breathing problems, stop taking this medicine and call your health care provider right away. If possible, use a ketone dipstick to check for ketones in your urine. This medicine may cause dry eyes and blurred vision. If you wear contact lenses, you may feel some discomfort. Lubricating eye drops may help. See your health care provider if the problem does not go away or is severe. Tell your health care provider right away if you have any change in your eyesight. This medicine may increase your risk of getting an infection. Call your health care provider for advice if you get a  fever, chills, or sore throat, or other symptoms of a cold or flu. Do not treat yourself. Try to avoid being around people who are sick. This medicine may cause serious skin reactions. They can happen weeks to months after starting the medicine. Contact your health care provider right away if you notice fevers or flu-like symptoms with a rash. The rash may be red or purple and then turn into blisters or peeling of the skin. Or, you might notice a red rash with swelling of the face, lips or lymph nodes in your neck or under your arms. What side effects may I notice from receiving this medication? Side effects that you  should report to your doctor or health care professional as soon as possible: allergic reactions (skin rash, itching or hives; swelling of the face, lips, or tongue) blurred vision changes in vision cough dry eyes high blood sugar (increased hunger, thirst or urination; unusually weak or tired, blurry vision) infection (fever, chills, cough, sore throat, pain or trouble passing urine) pain, tingling, numbness in the hands or feet redness, blistering, peeling, bleeding, swelling, or loosening of the skin on the palms of your hands or soles of your feet or inside the mouth trouble breathing Side effects that usually do not require medical attention (report these to your doctor or health care professional if they continue or are bothersome): changes in taste diarrhea dry skin hair loss loss of appetite nausea, vomiting weak or tired This list may not describe all possible side effects. Call your doctor for medical advice about side effects. You may report side effects to FDA at 1-800-FDA-1088. Where should I keep my medication? This medicine is given in a hospital or clinic and will not be stored at home. NOTE: This sheet is a summary. It may not cover all possible information. If you have questions about this medicine, talk to your doctor, pharmacist, or health care provider.  2023 Elsevier/Gold Standard (2019-12-08 00:00:00)

## 2021-12-12 NOTE — Progress Notes (Signed)
Per Dr. Alen Blew- ok to treat today with a SCR of 2.06. Glucose is 101.

## 2021-12-18 MED FILL — Dexamethasone Sodium Phosphate Inj 100 MG/10ML: INTRAMUSCULAR | Qty: 1 | Status: AC

## 2021-12-19 ENCOUNTER — Other Ambulatory Visit: Payer: Self-pay

## 2021-12-19 ENCOUNTER — Inpatient Hospital Stay: Payer: Medicare Other

## 2021-12-19 VITALS — BP 119/77 | HR 70 | Temp 97.7°F | Resp 18 | Wt 138.2 lb

## 2021-12-19 DIAGNOSIS — M898X9 Other specified disorders of bone, unspecified site: Secondary | ICD-10-CM | POA: Diagnosis not present

## 2021-12-19 DIAGNOSIS — N189 Chronic kidney disease, unspecified: Secondary | ICD-10-CM | POA: Diagnosis not present

## 2021-12-19 DIAGNOSIS — C679 Malignant neoplasm of bladder, unspecified: Secondary | ICD-10-CM

## 2021-12-19 DIAGNOSIS — Z5111 Encounter for antineoplastic chemotherapy: Secondary | ICD-10-CM | POA: Diagnosis not present

## 2021-12-19 DIAGNOSIS — D6481 Anemia due to antineoplastic chemotherapy: Secondary | ICD-10-CM | POA: Diagnosis not present

## 2021-12-19 DIAGNOSIS — Z95828 Presence of other vascular implants and grafts: Secondary | ICD-10-CM

## 2021-12-19 DIAGNOSIS — D630 Anemia in neoplastic disease: Secondary | ICD-10-CM | POA: Diagnosis not present

## 2021-12-19 DIAGNOSIS — Z79899 Other long term (current) drug therapy: Secondary | ICD-10-CM | POA: Diagnosis not present

## 2021-12-19 LAB — CMP (CANCER CENTER ONLY)
ALT: 10 U/L (ref 0–44)
AST: 12 U/L — ABNORMAL LOW (ref 15–41)
Albumin: 3.7 g/dL (ref 3.5–5.0)
Alkaline Phosphatase: 123 U/L (ref 38–126)
Anion gap: 7 (ref 5–15)
BUN: 30 mg/dL — ABNORMAL HIGH (ref 8–23)
CO2: 27 mmol/L (ref 22–32)
Calcium: 9 mg/dL (ref 8.9–10.3)
Chloride: 104 mmol/L (ref 98–111)
Creatinine: 1.81 mg/dL — ABNORMAL HIGH (ref 0.61–1.24)
GFR, Estimated: 39 mL/min — ABNORMAL LOW (ref >60)
Glucose, Bld: 98 mg/dL (ref 70–99)
Potassium: 4.3 mmol/L (ref 3.5–5.1)
Sodium: 138 mmol/L (ref 135–145)
Total Bilirubin: 0.4 mg/dL (ref 0.3–1.2)
Total Protein: 7.2 g/dL (ref 6.5–8.1)

## 2021-12-19 LAB — CBC WITH DIFFERENTIAL (CANCER CENTER ONLY)
Abs Immature Granulocytes: 0.1 10*3/uL — ABNORMAL HIGH (ref 0.00–0.07)
Basophils Absolute: 0 10*3/uL (ref 0.0–0.1)
Basophils Relative: 1 %
Eosinophils Absolute: 0.1 10*3/uL (ref 0.0–0.5)
Eosinophils Relative: 1 %
HCT: 34.3 % — ABNORMAL LOW (ref 39.0–52.0)
Hemoglobin: 11.2 g/dL — ABNORMAL LOW (ref 13.0–17.0)
Immature Granulocytes: 1 %
Lymphocytes Relative: 11 %
Lymphs Abs: 0.9 10*3/uL (ref 0.7–4.0)
MCH: 27.8 pg (ref 26.0–34.0)
MCHC: 32.7 g/dL (ref 30.0–36.0)
MCV: 85.1 fL (ref 80.0–100.0)
Monocytes Absolute: 0.8 10*3/uL (ref 0.1–1.0)
Monocytes Relative: 10 %
Neutro Abs: 6.7 10*3/uL (ref 1.7–7.7)
Neutrophils Relative %: 76 %
Platelet Count: 497 10*3/uL — ABNORMAL HIGH (ref 150–400)
RBC: 4.03 MIL/uL — ABNORMAL LOW (ref 4.22–5.81)
RDW: 16.5 % — ABNORMAL HIGH (ref 11.5–15.5)
WBC Count: 8.6 10*3/uL (ref 4.0–10.5)
nRBC: 0 % (ref 0.0–0.2)

## 2021-12-19 MED ORDER — SODIUM CHLORIDE 0.9% FLUSH
10.0000 mL | Freq: Once | INTRAVENOUS | Status: AC
  Administered 2021-12-19: 10 mL

## 2021-12-19 MED ORDER — SODIUM CHLORIDE 0.9 % IV SOLN: Freq: Once | INTRAVENOUS | Status: AC

## 2021-12-19 MED ORDER — PALONOSETRON HCL INJECTION 0.25 MG/5ML
INTRAVENOUS | Status: AC
  Filled 2021-12-19: qty 5

## 2021-12-19 MED ORDER — SODIUM CHLORIDE 0.9% FLUSH
10.0000 mL | INTRAVENOUS | Status: DC | PRN
  Administered 2021-12-19: 10 mL

## 2021-12-19 MED ORDER — SODIUM CHLORIDE 0.9 % IV SOLN
10.0000 mg | Freq: Once | INTRAVENOUS | Status: AC
  Administered 2021-12-19: 10 mg via INTRAVENOUS
  Filled 2021-12-19: qty 10

## 2021-12-19 MED ORDER — PALONOSETRON HCL INJECTION 0.25 MG/5ML
0.2500 mg | Freq: Once | INTRAVENOUS | Status: AC
  Administered 2021-12-19: 0.25 mg via INTRAVENOUS

## 2021-12-19 MED ORDER — SODIUM CHLORIDE 0.9 % IV SOLN
0.9200 mg/kg | Freq: Once | INTRAVENOUS | Status: AC
  Administered 2021-12-19: 60 mg via INTRAVENOUS
  Filled 2021-12-19: qty 6

## 2021-12-19 MED ORDER — HEPARIN SOD (PORK) LOCK FLUSH 100 UNIT/ML IV SOLN
500.0000 [IU] | Freq: Once | INTRAVENOUS | Status: AC | PRN
  Administered 2021-12-19: 500 [IU]

## 2021-12-19 NOTE — Progress Notes (Signed)
Per Dr. Alen Blew, okay to treat with Creatinine 1.81

## 2021-12-19 NOTE — Patient Instructions (Signed)
[facility]  Discharge Instructions: Thank you for choosing [name] to provide your oncology and hematology care.   If you have a lab appointment with the [facility], please go directly to the [facility] and check in at the registration area.   Wear comfortable clothing and clothing appropriate for easy access to any Portacath or PICC line.   We strive to give you quality time with your provider. You may need to reschedule your appointment if you arrive late (15 or more minutes).  Arriving late affects you and other patients whose appointments are after yours.  Also, if you miss three or more appointments without notifying the office, you may be dismissed from the clinic at the provider's discretion.      For prescription refill requests, have your pharmacy contact our office and allow 72 hours for refills to be completed.    Today you received the following chemotherapy and/or immunotherapy agents: Padcev      To help prevent nausea and vomiting after your treatment, we encourage you to take your nausea medication as directed.  BELOW ARE SYMPTOMS THAT SHOULD BE REPORTED IMMEDIATELY: *FEVER GREATER THAN 100.4 F (38 C) OR HIGHER *CHILLS OR SWEATING *NAUSEA AND VOMITING THAT IS NOT CONTROLLED WITH YOUR NAUSEA MEDICATION *UNUSUAL SHORTNESS OF BREATH *UNUSUAL BRUISING OR BLEEDING *URINARY PROBLEMS (pain or burning when urinating, or frequent urination) *BOWEL PROBLEMS (unusual diarrhea, constipation, pain near the anus) TENDERNESS IN MOUTH AND THROAT WITH OR WITHOUT PRESENCE OF ULCERS (sore throat, sores in mouth, or a toothache) UNUSUAL RASH, SWELLING OR PAIN  UNUSUAL VAGINAL DISCHARGE OR ITCHING   Items with * indicate a potential emergency and should be followed up as soon as possible or go to the Emergency Department if any problems should occur.  Please show the CHEMOTHERAPY ALERT CARD or IMMUNOTHERAPY ALERT CARD at check-in to the  Emergency Department and triage nurse.  Should you have questions after your visit or need to cancel or reschedule your appointment, please contact [facility]  Dept: [phone]  and follow the prompts.  Office hours are 8:00 a.m. to 4:30 p.m. Monday - Friday. Please note that voicemails left after 4:00 p.m. may not be returned until the following business day.  We are closed weekends and major holidays. You have access to a nurse at all times for urgent questions. Please call the main number to the clinic Dept: [phone] and follow the prompts.   For any non-urgent questions, you may also contact your provider using MyChart. We now offer e-Visits for anyone 83 and older to request care online for non-urgent symptoms. For details visit [email or web address].   Also download the MyChart app! Go to the app store, search "MyChart", open the app, select [facility], and log in with your MyChart username and password.  Masks are optional in the cancer centers. If you would like for your care team to wear a mask while they are taking care of you, please let them know. For doctor visits, patients may have with them one support Jen Eppinger who is at least 72 years old. At this time, visitors are not allowed in the infusion area.    Enfortumab vedotin injection What is this medication? ENFORTUMAB VEDOTIN (en FORT ue mab ve DOE tin) is a chemotherapy medicine and monoclonal antibody. It treats urothelial cancer. This medicine may be used for other purposes; ask your health care provider or pharmacist if you have questions. COMMON BRAND  NAME(S): PADCEV What should I tell my care team before I take this medication? They need to know if you have any of these conditions: diabetes (high blood sugar) eye disease, vision problems lung disease skin conditions or sensitivity tingling of the fingers or toes, or other nerve disorder an unusual or allergic reaction to enfortumab  vedotin, other medicines, foods, dyes or preservatives pregnant or trying to get pregnant breast-feeding How should I use this medication? This medicine is injected into a vein. It is given by a health care provider in a hospital or clinic setting. Talk to your health care provider about the use of this medicine in children. Special care may be needed. Overdosage: If you think you have taken too much of this medicine contact a poison control center or emergency room at once. NOTE: This medicine is only for you. Do not share this medicine with others. What if I miss a dose? Keep appointments for follow-up doses. It is important not to miss your dose. Call your doctor or health care provider if you are unable to keep an appointment. What may interact with this medication? This medicine may also interact with the following medications: certain antivirals for HIV certain medicines for fungal infections like ketoconazole, itraconazole, or posaconazole clarithromycin grapefruit juice mifepristone telithromycin This list may not describe all possible interactions. Give your health care provider a list of all the medicines, herbs, non-prescription drugs, or dietary supplements you use. Also tell them if you smoke, drink alcohol, or use illegal drugs. Some items may interact with your medicine. What should I watch for while using this medication? This medicine may make you feel generally unwell. This is not uncommon as chemotherapy can affect healthy cells as well as cancer cells. Report any side effects. Continue your course of treatment even though you feel ill unless your health care provider tells you to stop. Your condition will be monitored carefully while you are receiving this medicine. Do not become pregnant while taking this medicine or for 2 months after stopping it. Women should inform their health care provider if they wish to become pregnant or think they might be pregnant. Men should not  father a child while taking this medicine and for 4 months after stopping it. There is potential for serious side effects to an unborn child. Talk to your health care provider for more information. Do not breast-feed an infant while taking this medicine or for at least 3 weeks after stopping it. This medicine may make it more difficult to father a child. Talk to your health care provider if you are concerned about your fertility. This medicine may increase blood sugar. Ask your health care provider if changes in diet or medicines are needed if you have diabetes. This medicine can cause a serious condition in which there is too much acid in your blood. If you develop nausea, vomiting, stomach pain, unusual tiredness, or breathing problems, stop taking this medicine and call your health care provider right away. If possible, use a ketone dipstick to check for ketones in your urine. This medicine may cause dry eyes and blurred vision. If you wear contact lenses, you may feel some discomfort. Lubricating eye drops may help. See your health care provider if the problem does not go away or is severe. Tell your health care provider right away if you have any change in your eyesight. This medicine may increase your risk of getting an infection. Call your health care provider for advice if you get a  fever, chills, or sore throat, or other symptoms of a cold or flu. Do not treat yourself. Try to avoid being around people who are sick. This medicine may cause serious skin reactions. They can happen weeks to months after starting the medicine. Contact your health care provider right away if you notice fevers or flu-like symptoms with a rash. The rash may be red or purple and then turn into blisters or peeling of the skin. Or, you might notice a red rash with swelling of the face, lips or lymph nodes in your neck or under your arms. What side effects may I notice from receiving this medication? Side effects that you  should report to your doctor or health care professional as soon as possible: allergic reactions (skin rash, itching or hives; swelling of the face, lips, or tongue) blurred vision changes in vision cough dry eyes high blood sugar (increased hunger, thirst or urination; unusually weak or tired, blurry vision) infection (fever, chills, cough, sore throat, pain or trouble passing urine) pain, tingling, numbness in the hands or feet redness, blistering, peeling, bleeding, swelling, or loosening of the skin on the palms of your hands or soles of your feet or inside the mouth trouble breathing Side effects that usually do not require medical attention (report these to your doctor or health care professional if they continue or are bothersome): changes in taste diarrhea dry skin hair loss loss of appetite nausea, vomiting weak or tired This list may not describe all possible side effects. Call your doctor for medical advice about side effects. You may report side effects to FDA at 1-800-FDA-1088. Where should I keep my medication? This medicine is given in a hospital or clinic and will not be stored at home. NOTE: This sheet is a summary. It may not cover all possible information. If you have questions about this medicine, talk to your doctor, pharmacist, or health care provider.  2023 Elsevier/Gold Standard (2019-12-08 00:00:00)

## 2021-12-20 LAB — TSH: TSH: 2.911 u[IU]/mL (ref 0.350–4.500)

## 2021-12-22 ENCOUNTER — Other Ambulatory Visit: Payer: Self-pay | Admitting: Oncology

## 2021-12-22 ENCOUNTER — Other Ambulatory Visit: Payer: Self-pay | Admitting: Adult Health

## 2021-12-22 DIAGNOSIS — C679 Malignant neoplasm of bladder, unspecified: Secondary | ICD-10-CM

## 2021-12-26 ENCOUNTER — Other Ambulatory Visit: Payer: Self-pay

## 2021-12-26 ENCOUNTER — Inpatient Hospital Stay: Payer: Medicare Other

## 2021-12-26 VITALS — BP 103/65 | HR 80 | Temp 97.5°F | Resp 18

## 2021-12-26 DIAGNOSIS — Z5111 Encounter for antineoplastic chemotherapy: Secondary | ICD-10-CM | POA: Diagnosis not present

## 2021-12-26 DIAGNOSIS — C679 Malignant neoplasm of bladder, unspecified: Secondary | ICD-10-CM | POA: Diagnosis not present

## 2021-12-26 DIAGNOSIS — Z95828 Presence of other vascular implants and grafts: Secondary | ICD-10-CM

## 2021-12-26 DIAGNOSIS — Z79899 Other long term (current) drug therapy: Secondary | ICD-10-CM | POA: Diagnosis not present

## 2021-12-26 DIAGNOSIS — N189 Chronic kidney disease, unspecified: Secondary | ICD-10-CM | POA: Diagnosis not present

## 2021-12-26 DIAGNOSIS — M898X9 Other specified disorders of bone, unspecified site: Secondary | ICD-10-CM | POA: Diagnosis not present

## 2021-12-26 DIAGNOSIS — D6481 Anemia due to antineoplastic chemotherapy: Secondary | ICD-10-CM | POA: Diagnosis not present

## 2021-12-26 DIAGNOSIS — D630 Anemia in neoplastic disease: Secondary | ICD-10-CM | POA: Diagnosis not present

## 2021-12-26 LAB — CBC WITH DIFFERENTIAL (CANCER CENTER ONLY)
Abs Immature Granulocytes: 0.12 10*3/uL — ABNORMAL HIGH (ref 0.00–0.07)
Basophils Absolute: 0.1 10*3/uL (ref 0.0–0.1)
Basophils Relative: 0 %
Eosinophils Absolute: 0.1 10*3/uL (ref 0.0–0.5)
Eosinophils Relative: 1 %
HCT: 33.9 % — ABNORMAL LOW (ref 39.0–52.0)
Hemoglobin: 11 g/dL — ABNORMAL LOW (ref 13.0–17.0)
Immature Granulocytes: 1 %
Lymphocytes Relative: 9 %
Lymphs Abs: 1 10*3/uL (ref 0.7–4.0)
MCH: 27.7 pg (ref 26.0–34.0)
MCHC: 32.4 g/dL (ref 30.0–36.0)
MCV: 85.4 fL (ref 80.0–100.0)
Monocytes Absolute: 1 10*3/uL (ref 0.1–1.0)
Monocytes Relative: 8 %
Neutro Abs: 9.2 10*3/uL — ABNORMAL HIGH (ref 1.7–7.7)
Neutrophils Relative %: 81 %
Platelet Count: 411 10*3/uL — ABNORMAL HIGH (ref 150–400)
RBC: 3.97 MIL/uL — ABNORMAL LOW (ref 4.22–5.81)
RDW: 17.3 % — ABNORMAL HIGH (ref 11.5–15.5)
WBC Count: 11.5 10*3/uL — ABNORMAL HIGH (ref 4.0–10.5)
nRBC: 0 % (ref 0.0–0.2)

## 2021-12-26 LAB — CMP (CANCER CENTER ONLY)
ALT: 15 U/L (ref 0–44)
AST: 16 U/L (ref 15–41)
Albumin: 3.1 g/dL — ABNORMAL LOW (ref 3.5–5.0)
Alkaline Phosphatase: 120 U/L (ref 38–126)
Anion gap: 7 (ref 5–15)
BUN: 32 mg/dL — ABNORMAL HIGH (ref 8–23)
CO2: 23 mmol/L (ref 22–32)
Calcium: 8.8 mg/dL — ABNORMAL LOW (ref 8.9–10.3)
Chloride: 109 mmol/L (ref 98–111)
Creatinine: 2.1 mg/dL — ABNORMAL HIGH (ref 0.61–1.24)
GFR, Estimated: 33 mL/min — ABNORMAL LOW (ref >60)
Glucose, Bld: 99 mg/dL (ref 70–99)
Potassium: 4.1 mmol/L (ref 3.5–5.1)
Sodium: 139 mmol/L (ref 135–145)
Total Bilirubin: 0.3 mg/dL (ref 0.3–1.2)
Total Protein: 6.8 g/dL (ref 6.5–8.1)

## 2021-12-26 LAB — TSH: TSH: 3.093 u[IU]/mL (ref 0.350–4.500)

## 2021-12-26 MED ORDER — SODIUM CHLORIDE 0.9 % IV SOLN: Freq: Once | INTRAVENOUS | Status: AC

## 2021-12-26 MED ORDER — SODIUM CHLORIDE 0.9 % IV SOLN
60.0000 mg | Freq: Once | INTRAVENOUS | Status: AC
  Administered 2021-12-26: 60 mg via INTRAVENOUS
  Filled 2021-12-26: qty 6

## 2021-12-26 MED ORDER — SODIUM CHLORIDE 0.9% FLUSH
10.0000 mL | INTRAVENOUS | Status: DC | PRN
  Administered 2021-12-26: 10 mL

## 2021-12-26 MED ORDER — SODIUM CHLORIDE 0.9% FLUSH
10.0000 mL | Freq: Once | INTRAVENOUS | Status: AC
  Administered 2021-12-26: 10 mL

## 2021-12-26 MED ORDER — PROCHLORPERAZINE MALEATE 10 MG PO TABS
10.0000 mg | ORAL_TABLET | Freq: Once | ORAL | Status: AC
  Administered 2021-12-26: 10 mg via ORAL

## 2021-12-26 MED ORDER — PROCHLORPERAZINE MALEATE 10 MG PO TABS
ORAL_TABLET | ORAL | Status: AC
  Filled 2021-12-26: qty 1

## 2021-12-26 MED ORDER — HEPARIN SOD (PORK) LOCK FLUSH 100 UNIT/ML IV SOLN
500.0000 [IU] | Freq: Once | INTRAVENOUS | Status: AC | PRN
  Administered 2021-12-26: 500 [IU]

## 2021-12-26 NOTE — Progress Notes (Signed)
Ok to treat today per Dr. Alen Blew with a Scr of 2.10.

## 2021-12-26 NOTE — Progress Notes (Signed)
Spoke w/ Dr. Alen Blew and he is ok giving 60 mg of Padcev today due to limited supply of Padcev.  Larene Beach, PharmD

## 2021-12-26 NOTE — Patient Instructions (Signed)
[facility]  Discharge Instructions: Thank you for choosing [facility] to provide your oncology and hematology care.   If you have a lab appointment with the Cancer Center, please go directly to the Cancer Center and check in at the registration area.   Wear comfortable clothing and clothing appropriate for easy access to any Portacath or PICC line.   We strive to give you quality time with your provider. You may need to reschedule your appointment if you arrive late (15 or more minutes).  Arriving late affects you and other patients whose appointments are after yours.  Also, if you miss three or more appointments without notifying the office, you may be dismissed from the clinic at the provider's discretion.      For prescription refill requests, have your pharmacy contact our office and allow 72 hours for refills to be completed.    Today you received the following chemotherapy and/or immunotherapy agents: Padcev      To help prevent nausea and vomiting after your treatment, we encourage you to take your nausea medication as directed.  BELOW ARE SYMPTOMS THAT SHOULD BE REPORTED IMMEDIATELY: *FEVER GREATER THAN 100.4 F (38 C) OR HIGHER *CHILLS OR SWEATING *NAUSEA AND VOMITING THAT IS NOT CONTROLLED WITH YOUR NAUSEA MEDICATION *UNUSUAL SHORTNESS OF BREATH *UNUSUAL BRUISING OR BLEEDING *URINARY PROBLEMS (pain or burning when urinating, or frequent urination) *BOWEL PROBLEMS (unusual diarrhea, constipation, pain near the anus) TENDERNESS IN MOUTH AND THROAT WITH OR WITHOUT PRESENCE OF ULCERS (sore throat, sores in mouth, or a toothache) UNUSUAL RASH, SWELLING OR PAIN  UNUSUAL VAGINAL DISCHARGE OR ITCHING   Items with * indicate a potential emergency and should be followed up as soon as possible or go to the Emergency Department if any problems should occur.  Please show the CHEMOTHERAPY ALERT CARD or IMMUNOTHERAPY ALERT CARD at check-in to the  Emergency Department and triage nurse.  Should you have questions after your visit or need to cancel or reschedule your appointment, please contact [facility]  Dept: [phone]  and follow the prompts.  Office hours are 8:00 a.m. to 4:30 p.m. Monday - Friday. Please note that voicemails left after 4:00 p.m. may not be returned until the following business day.  We are closed weekends and major holidays. You have access to a nurse at all times for urgent questions. Please call the main number to the clinic Dept: [phone] and follow the prompts.   For any non-urgent questions, you may also contact your provider using MyChart. We now offer e-Visits for anyone 18 and older to request care online for non-urgent symptoms. For details visit [email or web address].   Also download the MyChart app! Go to the app store, search "MyChart", open the app, select , and log in with your MyChart username and password.  Masks are optional in the cancer centers. If you would like for your care team to wear a mask while they are taking care of you, please let them know. You may have one support person who is at least 72 years old accompany you for your appointments. 

## 2022-01-02 ENCOUNTER — Encounter: Payer: Self-pay | Admitting: Internal Medicine

## 2022-01-09 ENCOUNTER — Inpatient Hospital Stay: Payer: Medicare Other

## 2022-01-09 ENCOUNTER — Encounter: Payer: Self-pay | Admitting: Adult Health

## 2022-01-09 ENCOUNTER — Other Ambulatory Visit: Payer: Self-pay | Admitting: Adult Health

## 2022-01-09 ENCOUNTER — Inpatient Hospital Stay: Payer: Medicare Other | Admitting: Dietician

## 2022-01-09 ENCOUNTER — Inpatient Hospital Stay (HOSPITAL_BASED_OUTPATIENT_CLINIC_OR_DEPARTMENT_OTHER): Payer: Medicare Other | Admitting: Oncology

## 2022-01-09 VITALS — BP 116/66 | HR 103 | Temp 98.1°F | Resp 15 | Wt 138.8 lb

## 2022-01-09 VITALS — HR 84

## 2022-01-09 DIAGNOSIS — D630 Anemia in neoplastic disease: Secondary | ICD-10-CM | POA: Diagnosis not present

## 2022-01-09 DIAGNOSIS — Z79899 Other long term (current) drug therapy: Secondary | ICD-10-CM | POA: Diagnosis not present

## 2022-01-09 DIAGNOSIS — E782 Mixed hyperlipidemia: Secondary | ICD-10-CM

## 2022-01-09 DIAGNOSIS — Z5111 Encounter for antineoplastic chemotherapy: Secondary | ICD-10-CM | POA: Diagnosis not present

## 2022-01-09 DIAGNOSIS — D6481 Anemia due to antineoplastic chemotherapy: Secondary | ICD-10-CM | POA: Diagnosis not present

## 2022-01-09 DIAGNOSIS — Z95828 Presence of other vascular implants and grafts: Secondary | ICD-10-CM

## 2022-01-09 DIAGNOSIS — M898X9 Other specified disorders of bone, unspecified site: Secondary | ICD-10-CM | POA: Diagnosis not present

## 2022-01-09 DIAGNOSIS — C679 Malignant neoplasm of bladder, unspecified: Secondary | ICD-10-CM

## 2022-01-09 DIAGNOSIS — N189 Chronic kidney disease, unspecified: Secondary | ICD-10-CM | POA: Diagnosis not present

## 2022-01-09 LAB — CMP (CANCER CENTER ONLY)
ALT: 10 U/L (ref 0–44)
AST: 14 U/L — ABNORMAL LOW (ref 15–41)
Albumin: 3.6 g/dL (ref 3.5–5.0)
Alkaline Phosphatase: 119 U/L (ref 38–126)
Anion gap: 8 (ref 5–15)
BUN: 29 mg/dL — ABNORMAL HIGH (ref 8–23)
CO2: 25 mmol/L (ref 22–32)
Calcium: 9 mg/dL (ref 8.9–10.3)
Chloride: 108 mmol/L (ref 98–111)
Creatinine: 2.01 mg/dL — ABNORMAL HIGH (ref 0.61–1.24)
GFR, Estimated: 35 mL/min — ABNORMAL LOW (ref >60)
Glucose, Bld: 107 mg/dL — ABNORMAL HIGH (ref 70–99)
Potassium: 3.9 mmol/L (ref 3.5–5.1)
Sodium: 141 mmol/L (ref 135–145)
Total Bilirubin: 0.4 mg/dL (ref 0.3–1.2)
Total Protein: 6.6 g/dL (ref 6.5–8.1)

## 2022-01-09 LAB — CBC WITH DIFFERENTIAL (CANCER CENTER ONLY)
Abs Immature Granulocytes: 0.05 10*3/uL (ref 0.00–0.07)
Basophils Absolute: 0.1 10*3/uL (ref 0.0–0.1)
Basophils Relative: 1 %
Eosinophils Absolute: 0.1 10*3/uL (ref 0.0–0.5)
Eosinophils Relative: 1 %
HCT: 32.1 % — ABNORMAL LOW (ref 39.0–52.0)
Hemoglobin: 10.5 g/dL — ABNORMAL LOW (ref 13.0–17.0)
Immature Granulocytes: 1 %
Lymphocytes Relative: 7 %
Lymphs Abs: 0.7 10*3/uL (ref 0.7–4.0)
MCH: 27.7 pg (ref 26.0–34.0)
MCHC: 32.7 g/dL (ref 30.0–36.0)
MCV: 84.7 fL (ref 80.0–100.0)
Monocytes Absolute: 1.2 10*3/uL — ABNORMAL HIGH (ref 0.1–1.0)
Monocytes Relative: 11 %
Neutro Abs: 8.5 10*3/uL — ABNORMAL HIGH (ref 1.7–7.7)
Neutrophils Relative %: 79 %
Platelet Count: 444 10*3/uL — ABNORMAL HIGH (ref 150–400)
RBC: 3.79 MIL/uL — ABNORMAL LOW (ref 4.22–5.81)
RDW: 18.3 % — ABNORMAL HIGH (ref 11.5–15.5)
WBC Count: 10.5 10*3/uL (ref 4.0–10.5)
nRBC: 0 % (ref 0.0–0.2)

## 2022-01-09 LAB — TSH: TSH: 2.159 u[IU]/mL (ref 0.350–4.500)

## 2022-01-09 MED ORDER — SODIUM CHLORIDE 0.9% FLUSH
10.0000 mL | Freq: Once | INTRAVENOUS | Status: AC
  Administered 2022-01-09: 10 mL

## 2022-01-09 MED ORDER — SODIUM CHLORIDE 0.9% FLUSH
10.0000 mL | INTRAVENOUS | Status: DC | PRN
  Administered 2022-01-09: 10 mL

## 2022-01-09 MED ORDER — SODIUM CHLORIDE 0.9 % IV SOLN: 1.2500 mg/kg | Freq: Once | INTRAVENOUS | Status: DC

## 2022-01-09 MED ORDER — SODIUM CHLORIDE 0.9 % IV SOLN: Freq: Once | INTRAVENOUS | Status: AC

## 2022-01-09 MED ORDER — SODIUM CHLORIDE 0.9 % IV SOLN
0.9500 mg/kg | Freq: Once | INTRAVENOUS | Status: AC
  Administered 2022-01-09: 60 mg via INTRAVENOUS
  Filled 2022-01-09: qty 6

## 2022-01-09 MED ORDER — HEPARIN SOD (PORK) LOCK FLUSH 100 UNIT/ML IV SOLN
500.0000 [IU] | Freq: Once | INTRAVENOUS | Status: AC | PRN
  Administered 2022-01-09: 500 [IU]

## 2022-01-09 MED ORDER — PROCHLORPERAZINE MALEATE 10 MG PO TABS
10.0000 mg | ORAL_TABLET | Freq: Once | ORAL | Status: AC
  Administered 2022-01-09: 10 mg via ORAL
  Filled 2022-01-09: qty 1

## 2022-01-09 NOTE — Progress Notes (Signed)
Hematology and Oncology Follow Up Visit  Samuel [name] [id number] 1951-04-28 71 y.o. 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    Prior Therapy:  He is status post BCG treatment and June 2022.  Carboplatin and gemcitabine started on June 20, 2021.   He completed 3 cycles of therapy in March 2023.  Pembrolizumab 200 mg every 3 weeks started on August 22, 2021.   He completed 4 cycles of therapy.  Current therapy: Padcev 1 mg/kg cycle 1 started on November 14, 2021.  He is here for day 1 cycle 3  Interim History: [name] returns today for a follow-up visit.  Since the last visit, he reports a few complaints related to his therapy.  He reported diarrhea and fatigue but no other complaints.  He denies any nausea, vomiting or abdominal pain he has reported occasional distention.  He had denied any worsening neuropathy or falls.  He denies any syncope.  His performance status quality of life remains unchanged.    Medications: Reviewed without changes. Current Outpatient Medications  Medication Sig Dispense Refill   acetaminophen (TYLENOL) 325 MG tablet Take 325 mg by mouth every 6 (six) hours as needed for moderate pain or fever.     albuterol (PROVENTIL) (2.5 MG/3ML) 0.083% nebulizer solution Take 2.5 mg by nebulization in the morning and at bedtime.     albuterol (VENTOLIN HFA) 108 (90 Base) MCG/ACT inhaler Inhale 2 puffs into the lungs every 6 (six) hours as needed for wheezing or shortness of breath. 8 g 6   amLODipine (NORVASC) 10 MG tablet Take 10 mg by mouth at bedtime.     aspirin EC 81 MG tablet Take 1 tablet (81 mg total) by mouth daily. Swallow whole. 90 tablet 3   atorvastatin (LIPITOR) 40 MG tablet Take 1 tablet (40 mg total) by mouth daily. 90 tablet 3   Budeson-Glycopyrrol-Formoterol (BREZTRI AEROSPHERE) 160-9-4.8  MCG/ACT AERO Inhale 2 puffs into the lungs in the morning and at bedtime. 11.8 g 11   Cholecalciferol (VITAMIN D3) 25 MCG (1000 UT) CAPS 2 capsules daily.     citalopram (CELEXA) 20 MG tablet TAKE 1 TABLET(20 MG) BY MOUTH AT BEDTIME 90 tablet 0   Cyanocobalamin (B-12 PO) Take 1 capsule by mouth daily.     finasteride (PROSCAR) 5 MG tablet Take 5 mg by mouth daily.     furosemide (LASIX) 20 MG tablet Take 20 mg by mouth 3 (three) times a week.     lidocaine-prilocaine (EMLA) cream Apply 1 application topically as needed. 30 g 0   metoprolol succinate (TOPROL-XL) 25 MG 24 hr tablet Take 25 mg by mouth daily.     oxyCODONE (OXY IR/ROXICODONE) 5 MG immediate release tablet Take 1 tablet (5 mg total) by mouth every 6 (six) hours as needed for moderate pain or severe pain (Post-operatively). 30 tablet 0   OXYGEN Inhale 2 L into the lungs See admin instructions. 2L when taking a nap and at bedtime     prochlorperazine (COMPAZINE) 10 MG tablet TAKE 1 TABLET(10 MG) BY MOUTH EVERY 6 HOURS AS NEEDED FOR NAUSEA OR VOMITING (Patient not taking: Reported on 09/25/2021) 30 tablet 0   tamsulosin (FLOMAX) 0.4 MG CAPS capsule Take 2 capsules by mouth at bedtime.     No current facility-administered medications for this visit.   Facility-Administered Medications Ordered in Other Visits  Medication Dose Route Frequency  Provider Last Rate Last Admin   sodium chloride flush (NS) 0.9 % injection 10 mL  10 mL Intracatheter Once Wyatt Portela, MD         Allergies:  Allergies  Allergen Reactions   Penicillins Diarrhea and Nausea And Vomiting    Did it involve swelling of the face/tongue/throat, SOB, or low BP? No Did it involve sudden or severe rash/hives, skin peeling, or any reaction on the inside of your mouth or nose?  Did you need to seek medical attention at a hospital or doctor's office? No When did it last happen?    Several Years Ago   If all above answers are "NO", may proceed with cephalosporin  use.        Physical Exam:    Blood pressure 116/66, pulse (!) 103, temperature 98.1 F (36.7 C), temperature source Oral, resp. rate 15, weight 138 lb 12.8 oz (63 kg), SpO2 95 %.      ECOG: 1    General appearance: Comfortable appearing without any discomfort Head: Normocephalic without any trauma Oropharynx: Mucous membranes are moist and pink without any thrush or ulcers. Eyes: Pupils are equal and round reactive to light. Lymph nodes: No cervical, supraclavicular, inguinal or axillary lymphadenopathy.   Heart:regular rate and rhythm.  S1 and S2 without leg edema. Lung: Clear without any rhonchi or wheezes.  No dullness to percussion. Abdomin: Soft, nontender, nondistended with good bowel sounds.  No hepatosplenomegaly. Musculoskeletal: No joint deformity or effusion.  Full range of motion noted. Neurological: No deficits noted on motor, sensory and deep tendon reflex exam. Skin: No petechial rash or dryness.  Appeared moist.   .      Lab Results: Lab Results  Component Value Date   WBC 11.5 (H) 12/26/2021   HGB 11.0 (L) 12/26/2021   HCT 33.9 (L) 12/26/2021   MCV 85.4 12/26/2021   PLT 411 (H) 12/26/2021   PSA 3.13 04/04/2020     Chemistry      Component Value Date/Time   NA 139 12/26/2021 1209   K 4.1 12/26/2021 1209   CL 109 12/26/2021 1209   CO2 23 12/26/2021 1209   BUN 32 (H) 12/26/2021 1209   CREATININE 2.10 (H) 12/26/2021 1209   CREATININE 2.14 (H) 04/04/2020 0748      Component Value Date/Time   CALCIUM 8.8 (L) 12/26/2021 1209   ALKPHOS 120 12/26/2021 1209   AST 16 12/26/2021 1209   ALT 15 12/26/2021 1209   BILITOT 0.3 12/26/2021 1209         Impression and Plan:   71 year old with:   1.  Bladder cancer diagnosed in December 2022.  He was found to have stage IV high-grade urothelial carcinoma.   He continues to tolerate bedside without any major complications.  Risks and benefits of continuing this treatment were discussed.   These complications include neuropathy and hyperglycemia and mild suppression were reiterated.  The plan is to update his staging scan after this current cycle.  He is agreeable to proceed.  We will hold day 8 of therapy for better tolerance and try to mitigate some of the side effects he is experiencing.   2.  IV access: Port-A-Cath remains in use without any issues.   3.  Antiemetics: Compazine is available to him without any nausea or vomiting.   4.  Chronic Kidney disease: Creatinine clearance continues to be around 35 cc/min.   5.  Goals of care: His disease is incurable although aggressive measures are warranted.  6.  Anemia: Related to malignancy as well as treatment of his cancer.  Hemoglobin remains adequate without any transfusion.  7.  Bone pain: Manageable at this time without oxycodone.  8.  Anorexia: Improved at this time and weight is stable.  9.  Follow-up: He will finish the current cycle with weekly treatment and MD visit 4 weeks.   30  minutes were spent on this encounter.  The time was dedicated to reviewing laboratory data, disease status update, treatment choices and future plan of care review.     Zola Button, MD 8/30/20239:49 AM

## 2022-01-09 NOTE — Progress Notes (Signed)
Per Dr. Alen Blew ok to treat with Scr of 2.01 today.

## 2022-01-09 NOTE — Patient Instructions (Signed)
[facility] MEDICAL ONCOLOGY  Discharge Instructions: Thank you for choosing [facility] to provide your oncology and hematology care.   If you have a lab appointment with the Cancer Center, please go directly to the Cancer Center and check in at the registration area.   Wear comfortable clothing and clothing appropriate for easy access to any Portacath or PICC line.   We strive to give you quality time with your provider. You may need to reschedule your appointment if you arrive late (15 or more minutes).  Arriving late affects you and other patients whose appointments are after yours.  Also, if you miss three or more appointments without notifying the office, you may be dismissed from the clinic at the provider's discretion.      For prescription refill requests, have your pharmacy contact our office and allow 72 hours for refills to be completed.    Today you received the following chemotherapy and/or immunotherapy agents: Padcev      To help prevent nausea and vomiting after your treatment, we encourage you to take your nausea medication as directed.  BELOW ARE SYMPTOMS THAT SHOULD BE REPORTED IMMEDIATELY: *FEVER GREATER THAN 100.4 F (38 C) OR HIGHER *CHILLS OR SWEATING *NAUSEA AND VOMITING THAT IS NOT CONTROLLED WITH YOUR NAUSEA MEDICATION *UNUSUAL SHORTNESS OF BREATH *UNUSUAL BRUISING OR BLEEDING *URINARY PROBLEMS (pain or burning when urinating, or frequent urination) *BOWEL PROBLEMS (unusual diarrhea, constipation, pain near the anus) TENDERNESS IN MOUTH AND THROAT WITH OR WITHOUT PRESENCE OF ULCERS (sore throat, sores in mouth, or a toothache) UNUSUAL RASH, SWELLING OR PAIN  UNUSUAL VAGINAL DISCHARGE OR ITCHING   Items with * indicate a potential emergency and should be followed up as soon as possible or go to the Emergency Department if any problems should occur.  Please show the CHEMOTHERAPY ALERT CARD or IMMUNOTHERAPY ALERT CARD at check-in to the  Emergency Department and triage nurse.  Should you have questions after your visit or need to cancel or reschedule your appointment, please contact [facility] MEDICAL ONCOLOGY  Dept: [phone]  and follow the prompts.  Office hours are 8:00 a.m. to 4:30 p.m. Monday - Friday. Please note that voicemails left after 4:00 p.m. may not be returned until the following business day.  We are closed weekends and major holidays. You have access to a nurse at all times for urgent questions. Please call the main number to the clinic Dept: [phone] and follow the prompts.   For any non-urgent questions, you may also contact your provider using MyChart. We now offer e-Visits for anyone 18 and older to request care online for non-urgent symptoms. For details visit [email or web address].   Also download the MyChart app! Go to the app store, search "MyChart", open the app, select , and log in with your MyChart username and password.  Masks are optional in the cancer centers. If you would like for your care team to wear a mask while they are taking care of you, please let them know. You may have one support person who is at least 72 years old accompany you for your appointments. 

## 2022-01-09 NOTE — Progress Notes (Signed)
Nutrition Follow-up:  Patient with stage IV bladder cancer. He completed 3 cycles carboplatin/gemcitabine in March 2023 followed by 4 cycles of Keytruda. Patient is currently receiving Padcev (started 7/5).  Met with patient and wife during infusion. Patient reports appetite is okay, says foods have an off taste. Patient has been having episodes of diarrhea recently. Per wife 2 loose bowel movements daily. He has been drinking 2 Boost  Medications: oxycodone, celexa  Labs: glucose 107, BUN 29, Cr 2.01  Anthropometrics: Weight 138 lb 12.8 oz today stable x 3 weeks  8/9 - 138 lb 4 oz  7/19 - 142 lb 12 oz  6/13 - 143 lb 1.6 oz    NUTRITION DIAGNOSIS: Food and nutrition related knowledge deficit continues    INTERVENTION:  Continue drinking 2 Boost Plus/equivalent  Discussed strategies for diarrhea - handout with tips provided Reviewed strategies for increasing water intake Discussed tips for altered taste - handout provided Suggested baking soda salt water rinses several times daily before meals    MONITORING, EVALUATION, GOAL: weight trends, intake    NEXT VISIT: Wednesday October 11 during infusion

## 2022-01-09 NOTE — Progress Notes (Signed)
Confirmed Padcev dose w/ Dr. Alen Blew: continue 0.92 mg/kg as prior dosing. Premed now Compazine w/ updates for integrated scheduling (low emetogenic risk; NCCN).  Kennith Center, Pharm.D., CPP 01/09/2022'@11'$ :39 AM

## 2022-01-10 NOTE — Telephone Encounter (Signed)
Please advise 

## 2022-01-12 DIAGNOSIS — J9611 Chronic respiratory failure with hypoxia: Secondary | ICD-10-CM | POA: Diagnosis not present

## 2022-01-16 ENCOUNTER — Telehealth: Payer: Self-pay | Admitting: Oncology

## 2022-01-16 ENCOUNTER — Inpatient Hospital Stay: Payer: Medicare Other

## 2022-01-16 NOTE — Telephone Encounter (Signed)
Called patient regarding upcoming appointments, patient is notified. 

## 2022-01-22 ENCOUNTER — Encounter: Payer: Self-pay | Admitting: Oncology

## 2022-01-23 ENCOUNTER — Other Ambulatory Visit: Payer: Self-pay | Admitting: *Deleted

## 2022-01-23 ENCOUNTER — Inpatient Hospital Stay: Payer: Medicare Other

## 2022-01-23 ENCOUNTER — Other Ambulatory Visit: Payer: Self-pay | Admitting: Oncology

## 2022-01-23 ENCOUNTER — Inpatient Hospital Stay: Payer: Medicare Other | Attending: Oncology

## 2022-01-23 VITALS — BP 121/79 | HR 77 | Temp 98.1°F | Resp 18

## 2022-01-23 DIAGNOSIS — R319 Hematuria, unspecified: Secondary | ICD-10-CM

## 2022-01-23 DIAGNOSIS — I2510 Atherosclerotic heart disease of native coronary artery without angina pectoris: Secondary | ICD-10-CM | POA: Insufficient documentation

## 2022-01-23 DIAGNOSIS — Z5111 Encounter for antineoplastic chemotherapy: Secondary | ICD-10-CM | POA: Diagnosis not present

## 2022-01-23 DIAGNOSIS — C679 Malignant neoplasm of bladder, unspecified: Secondary | ICD-10-CM

## 2022-01-23 DIAGNOSIS — C7951 Secondary malignant neoplasm of bone: Secondary | ICD-10-CM | POA: Insufficient documentation

## 2022-01-23 DIAGNOSIS — Z79899 Other long term (current) drug therapy: Secondary | ICD-10-CM | POA: Diagnosis not present

## 2022-01-23 DIAGNOSIS — C61 Malignant neoplasm of prostate: Secondary | ICD-10-CM | POA: Diagnosis not present

## 2022-01-23 DIAGNOSIS — D649 Anemia, unspecified: Secondary | ICD-10-CM | POA: Insufficient documentation

## 2022-01-23 DIAGNOSIS — J439 Emphysema, unspecified: Secondary | ICD-10-CM | POA: Insufficient documentation

## 2022-01-23 DIAGNOSIS — I700 Atherosclerosis of aorta: Secondary | ICD-10-CM | POA: Diagnosis not present

## 2022-01-23 DIAGNOSIS — R630 Anorexia: Secondary | ICD-10-CM | POA: Insufficient documentation

## 2022-01-23 DIAGNOSIS — Z95828 Presence of other vascular implants and grafts: Secondary | ICD-10-CM

## 2022-01-23 LAB — CBC WITH DIFFERENTIAL (CANCER CENTER ONLY)
Abs Immature Granulocytes: 0.07 10*3/uL (ref 0.00–0.07)
Basophils Absolute: 0.1 10*3/uL (ref 0.0–0.1)
Basophils Relative: 1 %
Eosinophils Absolute: 0.2 10*3/uL (ref 0.0–0.5)
Eosinophils Relative: 2 %
HCT: 33.6 % — ABNORMAL LOW (ref 39.0–52.0)
Hemoglobin: 10.7 g/dL — ABNORMAL LOW (ref 13.0–17.0)
Immature Granulocytes: 1 %
Lymphocytes Relative: 9 %
Lymphs Abs: 1 10*3/uL (ref 0.7–4.0)
MCH: 27.3 pg (ref 26.0–34.0)
MCHC: 31.8 g/dL (ref 30.0–36.0)
MCV: 85.7 fL (ref 80.0–100.0)
Monocytes Absolute: 0.9 10*3/uL (ref 0.1–1.0)
Monocytes Relative: 8 %
Neutro Abs: 8.5 10*3/uL — ABNORMAL HIGH (ref 1.7–7.7)
Neutrophils Relative %: 79 %
Platelet Count: 496 10*3/uL — ABNORMAL HIGH (ref 150–400)
RBC: 3.92 MIL/uL — ABNORMAL LOW (ref 4.22–5.81)
RDW: 18.6 % — ABNORMAL HIGH (ref 11.5–15.5)
WBC Count: 10.7 10*3/uL — ABNORMAL HIGH (ref 4.0–10.5)
nRBC: 0 % (ref 0.0–0.2)

## 2022-01-23 LAB — URINALYSIS, COMPLETE (UACMP) WITH MICROSCOPIC
Bilirubin Urine: NEGATIVE
Glucose, UA: NEGATIVE mg/dL
Ketones, ur: NEGATIVE mg/dL
Nitrite: NEGATIVE
Protein, ur: 100 mg/dL — AB
RBC / HPF: >50 RBC/hpf — ABNORMAL HIGH (ref 0–5)
Specific Gravity, Urine: 1.009 (ref 1.005–1.030)
WBC, UA: >50 WBC/hpf — ABNORMAL HIGH (ref 0–5)
pH: 6 (ref 5.0–8.0)

## 2022-01-23 LAB — CMP (CANCER CENTER ONLY)
ALT: 10 U/L (ref 0–44)
AST: 16 U/L (ref 15–41)
Albumin: 3.7 g/dL (ref 3.5–5.0)
Alkaline Phosphatase: 117 U/L (ref 38–126)
Anion gap: 7 (ref 5–15)
BUN: 29 mg/dL — ABNORMAL HIGH (ref 8–23)
CO2: 27 mmol/L (ref 22–32)
Calcium: 9.3 mg/dL (ref 8.9–10.3)
Chloride: 103 mmol/L (ref 98–111)
Creatinine: 1.83 mg/dL — ABNORMAL HIGH (ref 0.61–1.24)
GFR, Estimated: 39 mL/min — ABNORMAL LOW (ref >60)
Glucose, Bld: 113 mg/dL — ABNORMAL HIGH (ref 70–99)
Potassium: 3.9 mmol/L (ref 3.5–5.1)
Sodium: 137 mmol/L (ref 135–145)
Total Bilirubin: 0.5 mg/dL (ref 0.3–1.2)
Total Protein: 6.9 g/dL (ref 6.5–8.1)

## 2022-01-23 MED ORDER — HEPARIN SOD (PORK) LOCK FLUSH 100 UNIT/ML IV SOLN
500.0000 [IU] | Freq: Once | INTRAVENOUS | Status: AC | PRN
  Administered 2022-01-23: 500 [IU]

## 2022-01-23 MED ORDER — SODIUM CHLORIDE 0.9 % IV SOLN: Freq: Once | INTRAVENOUS | Status: AC

## 2022-01-23 MED ORDER — SODIUM CHLORIDE 0.9% FLUSH
10.0000 mL | INTRAVENOUS | Status: DC | PRN
  Administered 2022-01-23: 10 mL

## 2022-01-23 MED ORDER — CIPROFLOXACIN HCL 500 MG PO TABS: 500.0000 mg | ORAL_TABLET | Freq: Two times a day (BID) | ORAL | 0 refills | Status: AC

## 2022-01-23 MED ORDER — PROCHLORPERAZINE MALEATE 10 MG PO TABS
10.0000 mg | ORAL_TABLET | Freq: Once | ORAL | Status: AC
  Administered 2022-01-23: 10 mg via ORAL
  Filled 2022-01-23: qty 1

## 2022-01-23 MED ORDER — SODIUM CHLORIDE 0.9 % IV SOLN
0.9500 mg/kg | Freq: Once | INTRAVENOUS | Status: AC
  Administered 2022-01-23: 60 mg via INTRAVENOUS
  Filled 2022-01-23: qty 6

## 2022-01-23 MED ORDER — SODIUM CHLORIDE 0.9% FLUSH
10.0000 mL | Freq: Once | INTRAVENOUS | Status: AC
  Administered 2022-01-23: 10 mL

## 2022-01-23 NOTE — Progress Notes (Signed)
Per Alen Blew MD, ok to treat with SCR 1.8 today.

## 2022-01-23 NOTE — Patient Instructions (Signed)
[facility] MEDICAL ONCOLOGY  Discharge Instructions: Thank you for choosing [facility] to provide your oncology and hematology care.   If you have a lab appointment with the Cancer Center, please go directly to the Cancer Center and check in at the registration area.   Wear comfortable clothing and clothing appropriate for easy access to any Portacath or PICC line.   We strive to give you quality time with your provider. You may need to reschedule your appointment if you arrive late (15 or more minutes).  Arriving late affects you and other patients whose appointments are after yours.  Also, if you miss three or more appointments without notifying the office, you may be dismissed from the clinic at the provider's discretion.      For prescription refill requests, have your pharmacy contact our office and allow 72 hours for refills to be completed.    Today you received the following chemotherapy and/or immunotherapy agents Enfortumab      To help prevent nausea and vomiting after your treatment, we encourage you to take your nausea medication as directed.  BELOW ARE SYMPTOMS THAT SHOULD BE REPORTED IMMEDIATELY: *FEVER GREATER THAN 100.4 F (38 C) OR HIGHER *CHILLS OR SWEATING *NAUSEA AND VOMITING THAT IS NOT CONTROLLED WITH YOUR NAUSEA MEDICATION *UNUSUAL SHORTNESS OF BREATH *UNUSUAL BRUISING OR BLEEDING *URINARY PROBLEMS (pain or burning when urinating, or frequent urination) *BOWEL PROBLEMS (unusual diarrhea, constipation, pain near the anus) TENDERNESS IN MOUTH AND THROAT WITH OR WITHOUT PRESENCE OF ULCERS (sore throat, sores in mouth, or a toothache) UNUSUAL RASH, SWELLING OR PAIN  UNUSUAL VAGINAL DISCHARGE OR ITCHING   Items with * indicate a potential emergency and should be followed up as soon as possible or go to the Emergency Department if any problems should occur.  Please show the CHEMOTHERAPY ALERT CARD or IMMUNOTHERAPY ALERT CARD at check-in to  the Emergency Department and triage nurse.  Should you have questions after your visit or need to cancel or reschedule your appointment, please contact [facility] ONCOL[facility]  Dept: [phone]  and follow the prompts.  Office hours are 8:00 a.m. to 4:30 p.m. Monday - Friday. Please note that voicemails left after 4:00 p.m. may not be returned until the following business day.  We are closed weekends and major holidays. You have access to a nurse at all times for urgent questions. Please call the main number to the clinic Dept: [phone] and follow the prompts.   For any non-urgent questions, you may also contact your provider using MyChart. We now offer e-Visits for anyone 18 and older to request care online for non-urgent symptoms. For details visit [email or web address].   Also download the MyChart app! Go to the app store, search "MyChart", open the app, select [facility], and log in with your MyChart username and password.  Masks are optional in the cancer centers. If you would like for your care team to wear a mask while they are taking care of you, please let them know. You may have one support person who is at least 72 years old accompany you for your appointments. 

## 2022-01-23 NOTE — Patient Instructions (Signed)

## 2022-01-24 ENCOUNTER — Encounter: Payer: Self-pay | Admitting: Oncology

## 2022-01-25 ENCOUNTER — Telehealth: Payer: Self-pay | Admitting: Pharmacist

## 2022-01-25 LAB — URINE CULTURE: Culture: >=100000 — AB

## 2022-01-25 NOTE — Chronic Care Management (AMB) (Signed)
Chronic Care Management Pharmacy Assistant   Name: Samuel [name]  MRN: [id number] DOB: [birth year]  Reason for Encounter: Medication Review   Conditions to be addressed/monitored: General Assessment  Recent office visits:  None   Recent consult visits:  01/23/22 Patient presented to [facility] for Infusion.  01/09/22 Wyatt Portela, MD (Oncology) - Patient presented for Malignant neoplasm of urinary bladder unspecified site. No medication changes.  12/12/21 Wyatt Portela, MD (Oncology) - Patient presented for Port-A- Cath in place and other concerns.  11/14/21 Wyatt Portela, MD (Oncology) - Patient presented for Malignant neoplasm of urinary bladder unspecified site. No medication changes.  10/23/21 Wyatt Portela, MD (Oncology) - Patient presented for Malignant neoplasm of urinary bladder unspecified site. No medication changes.  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 No other visit details available.  10/03/21 Wyatt Portela, MD (Oncology) - Patient presented for Malignant neoplasm of urinary bladder unspecified site. No medication changes.  Hospital visits:  None in previous 6 months  Medications: Outpatient Encounter Medications as of 01/25/2022  Medication Sig Note   acetaminophen (TYLENOL) 325 MG tablet Take 325 mg by mouth every 6 (six) hours as needed for moderate pain or fever.    albuterol (PROVENTIL) (2.5 MG/3ML) 0.083% nebulizer solution Take 2.5 mg by nebulization in the morning and at bedtime.    albuterol (VENTOLIN HFA) 108 (90 Base) MCG/ACT inhaler Inhale 2 puffs into the lungs every 6 (six) hours as needed for wheezing or shortness of breath.    amLODipine (NORVASC) 10 MG tablet Take 10 mg by mouth at bedtime.    aspirin EC 81 MG tablet Take 1 tablet (81 mg total) by mouth daily. Swallow whole.    atorvastatin (LIPITOR) 40 MG tablet TAKE 1  TABLET(40 MG) BY MOUTH DAILY    Budeson-Glycopyrrol-Formoterol (BREZTRI AEROSPHERE) 160-9-4.8 MCG/ACT AERO Inhale 2 puffs into the lungs in the morning and at bedtime.    Cholecalciferol (VITAMIN D3) 25 MCG (1000 UT) CAPS 2 capsules daily.    ciprofloxacin (CIPRO) 500 MG tablet Take 1 tablet (500 mg total) by mouth 2 (two) times daily.    citalopram (CELEXA) 20 MG tablet TAKE 1 TABLET(20 MG) BY MOUTH AT BEDTIME    Cyanocobalamin (B-12 PO) Take 1 capsule by mouth daily.    finasteride (PROSCAR) 5 MG tablet Take 5 mg by mouth daily.    furosemide (LASIX) 20 MG tablet Take 20 mg by mouth 3 (three) times a week.    lidocaine-prilocaine (EMLA) cream Apply 1 application topically as needed. 07/06/2021: Hasn't started.   metoprolol succinate (TOPROL-XL) 25 MG 24 hr tablet Take 25 mg by mouth daily.    oxyCODONE (OXY IR/ROXICODONE) 5 MG immediate release tablet Take 1 tablet (5 mg total) by mouth every 6 (six) hours as needed for moderate pain or severe pain (Post-operatively).    OXYGEN Inhale 2 L into the lungs See admin instructions. 2L when taking a nap and at bedtime    prochlorperazine (COMPAZINE) 10 MG tablet TAKE 1 TABLET(10 MG) BY MOUTH EVERY 6 HOURS AS NEEDED FOR NAUSEA OR VOMITING (Patient not taking: Reported on 09/25/2021)    tamsulosin (FLOMAX) 0.4 MG CAPS capsule Take 2 capsules by mouth at bedtime.    No facility-administered encounter medications on file as of 01/25/2022.   Contacted [facility] for General Review Call   Adherence Review:  Does the Clinical Pharmacist Assistant  have access to adherence rates? Yes Adherence rates for STAR metric medications  Atorvastatin (Lipitor) 40 mg - Last filled 01/05/22 90 DS at [facility] Atorvastatin (Lipitor) 40 mg - Last filled 10/04/21 90 DS at [facility] Does the patient have >5 day gap between last estimated fill dates for any of the above medications or other medication gaps? No    Disease State Questions:  Able to  connect with Patient? Yes Did patient have any problems with their health recently? No  Have you had any admissions or emergency room visits or worsening of your condition(s) since last visit? No Details of ED visit, hospital visit and/or worsening condition(s): Have you had any visits with new specialists or providers since your last visit? No Explain: Have you had any new health care problem(s) since your last visit? No  Have you run out of any of your medications since you last spoke with clinical pharmacist? No  Are there any medications you are not taking as prescribed? No  Are you having any issues or side effects with your medications? No  Do you have any other health concerns or questions you want to discuss with your Clinical Pharmacist before your next visit? No  Are there any health concerns that you feel we can do a better job addressing? No Note Patient's response. Are you having any problems with any of the following since the last visit: (select all that apply)  None   12. Any falls since last visit? No  13. Any increased or uncontrolled pain since last visit? No    . Additional Details? Patient reports he has been fine no issues or concerns he is not interested in following up with Pharmacist at this time.    Care Gaps: COVID Booster - Overdue Zoster Vaccine - Overdue Flu Vaccine - Overdue CCM- Declined BP- 116/66 01/09/22 AWV- 08/24/21 Lab Results  Component Value Date   HGBA1C 5.9 (H) 04/04/2020   Star Rating Drugs: Atorvastatin (Lipitor) 40 mg - Last filled 01/05/22 90 DS at [facility] Pharmacist Assistant [phone]

## 2022-01-28 ENCOUNTER — Ambulatory Visit (HOSPITAL_COMMUNITY)
Admission: RE | Admit: 2022-01-28 | Discharge: 2022-01-28 | Disposition: A | Discharge status: Home / Self Care | Payer: Medicare Other | Source: Ambulatory Visit | Attending: Oncology | Admitting: Oncology

## 2022-01-28 DIAGNOSIS — C679 Malignant neoplasm of bladder, unspecified: Secondary | ICD-10-CM | POA: Insufficient documentation

## 2022-01-28 LAB — GLUCOSE, CAPILLARY: Glucose-Capillary: 106 mg/dL — ABNORMAL HIGH (ref 70–99)

## 2022-01-28 MED ORDER — FLUDEOXYGLUCOSE F - 18 (FDG) INJECTION
6.4000 | Freq: Once | INTRAVENOUS | Status: AC | PRN
  Administered 2022-01-28: 6.81 via INTRAVENOUS

## 2022-02-04 ENCOUNTER — Ambulatory Visit (INDEPENDENT_AMBULATORY_CARE_PROVIDER_SITE_OTHER): Payer: Medicare Other | Admitting: Pulmonary Disease

## 2022-02-04 ENCOUNTER — Encounter: Payer: Self-pay | Admitting: Pulmonary Disease

## 2022-02-04 VITALS — BP 114/68 | HR 86 | Ht 63.0 in | Wt 133.0 lb

## 2022-02-04 DIAGNOSIS — G4736 Sleep related hypoventilation in conditions classified elsewhere: Secondary | ICD-10-CM

## 2022-02-04 DIAGNOSIS — J432 Centrilobular emphysema: Secondary | ICD-10-CM | POA: Diagnosis not present

## 2022-02-04 DIAGNOSIS — J439 Emphysema, unspecified: Secondary | ICD-10-CM | POA: Diagnosis not present

## 2022-02-04 NOTE — Patient Instructions (Addendum)
Continue breztri 2 puffs twice daily  Continue 2L of oxygen at night with humidification, we will check if we need to send in updated order to DME company  Use albuterol as needed  Recommend consistent food intake for nutrional purposes to reduce lung infections in the future. I also recommend taking multiple short 5-10 minute walks throughout the day to maintain your conditioning as you go through chemo.    Follow up in a 6 months.

## 2022-02-04 NOTE — Addendum Note (Signed)
Addended by: Valerie Salts on: 02/04/2022 04:59 PM   Modules accepted: Orders

## 2022-02-04 NOTE — Progress Notes (Signed)
Synopsis: Referred in June 2022 for COPD by Dorothyann Peng, NP  Subjective:   PATIENT ID: Samuel [name] GENDER: male DOB: 06-Sep-1950, MRN: [id number]  HPI  Chief Complaint  Patient presents with   Follow-up    6 mo f/u. States his breathing has been stable since last visit.    Samuel [name] is a 71 year old male, former smoker with CKDIII, hyperlipidemia and hypertension who returns to pulmonary clinic for COPD follow up.  He remains on breztri inhaler 2 puffs twice daily. He is using albuterol nebulizer treatments as needed.   His nose bleeds are gone since using humidification with his nocturnal oxygen.  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 He was started on carboplatin and gemcitabine 06/20/21. He was admitted 2/24 to 2/27 for neutropenia and UTI.   His breathing has been stable on breztri. He has not need nebulizers or albuterol much since he has been very inactive due to fatigue from chemo. He has lost a few lbs since starting chemo due to lack of appetite.   He complains of nose bleeds since starting home nocturnal oxygen. He does not have humidification setup.  OV 04/20/21 He was seen on 03/29/21 by Marland Kitchen, NP for an acute sick visit with increasing shortness of breath and pre-syncopal episode. Chest radiograph was obtained with no acute changes. Laboratory workup showed worsening renal function and he was advised to go to the ER. He was admitted 11/18 to 11/22 for AKI and obstructive uropathy due to recurrent urothelial cancer. He is being followed by Nephrology and Urology. He is scheduled for bladder surgery on 12/28 with Urology for evaluation of the cancer involvement.   Since admission he has been doing well from a respiratory standpoint. Intermittent wheezing reported. No cough or sputum production. The exertional dyspnea is back to baseline.  OV 11/08/20 He reports very  limited physical activity due to limitations from his shortness of breath.  He lives a fairly sedentary life at this time.  He is accompanied by his wife today.  He is currently on Breztri 2 puffs twice daily where he transitioned from Trelegy back and January 2022 with notable improvement in his wheezing.  He does report increased frequency of wheezing in the evening that he notices when going to bed.  He had lung cancer screening CT chest 09/27/2020 with findings of diffuse centrilobular emphysema and no concerning nodules.  He does not have a nebulizer machine at home.  Past Medical History:  Diagnosis Date   Anemia    Anxiety    Arthritis    Bladder cancer [facility])    urologist--- dr Tresa Moore   BPH with obstruction/lower urinary tract symptoms    CKD (chronic kidney disease), stage III ([place])    Congenital deformity of left hand    COPD (chronic obstructive pulmonary disease) ([place])    followed by pcp   Depression    Dyspnea    Essential hypertension    followed by pcp   History of adenomatous polyp of colon    Hyperlipidemia    Renal cysts, acquired, bilateral    Wears glasses      Family History  Problem Relation Age of Onset   Hypertension Mother    Stroke Mother    Heart disease Father    Breast cancer Sister    Prostate cancer Brother    Hypertension Sister    Colon cancer Neg Hx  Esophageal cancer Neg Hx    Pancreatic cancer Neg Hx    Rectal cancer Neg Hx    Stomach cancer Neg Hx      Social History   Socioeconomic History   Marital status: Married    Spouse name: Not on file   Number of children: Not on file   Years of education: Not on file   Highest education level: 12th grade  Occupational History   Occupation: retired  Tobacco Use   Smoking status: Former    Years: 58.00    Types: Cigarettes    Quit date: 01/16/2017    Years since quitting: 5.0   Smokeless tobacco: Never  Vaping Use   Vaping Use: Never used  Substance and Sexual Activity   Alcohol  use: No   Drug use: Never   Sexual activity: Not on file  Other Topics Concern   Not on file  Social History Narrative   Retired    Married       He watches television   Social Determinants of Health   Financial Resource Strain: Medium Risk (08/24/2021)   Overall Financial Resource Strain (CARDIA)    Difficulty of Paying Living Expenses: Somewhat hard  Food Insecurity: Food Insecurity Present (08/24/2021)   Hunger Vital Sign    Worried About [place] in the Last Year: Sometimes true    Ran Out of Food in the Last Year: Sometimes true  Transportation Needs: No Transportation Needs (08/24/2021)   PRAPARE - Hydrologist (Medical): No    Lack of Transportation (Non-Medical): No  Physical Activity: Insufficiently Active (08/24/2021)   Exercise Vital Sign    Days of Exercise per Week: 2 days    Minutes of Exercise per Session: 10 min  Stress: No Stress Concern Present (08/24/2021)   [place]    Feeling of Stress : Not at all  Social Connections: Moderately Isolated (08/24/2021)   Social Connection and Isolation Panel [NHANES]    Frequency of Communication with Friends and Family: Once a week    Frequency of Social Gatherings with Friends and Family: More than three times a week    Attends Religious Services: Never    Marine scientist or Organizations: No    Attends Archivist Meetings: Never    Marital Status: Married  Human resources officer Violence: Not At Risk (08/24/2021)   Humiliation, Afraid, Rape, and Kick questionnaire    Fear of Current or Ex-Partner: No    Emotionally Abused: No    Physically Abused: No    Sexually Abused: No     Allergies  Allergen Reactions   Penicillins Diarrhea and Nausea And Vomiting    Did it involve swelling of the face/tongue/throat, SOB, or low BP? No Did it involve sudden or severe rash/hives, skin peeling, or any reaction on  the inside of your mouth or nose?  Did you need to seek medical attention at a hospital or doctor's office? No When did it last happen?    Several Years Ago   If all above answers are "NO", may proceed with cephalosporin use.       Outpatient Medications Prior to Visit  Medication Sig Dispense Refill   acetaminophen (TYLENOL) 325 MG tablet Take 325 mg by mouth every 6 (six) hours as needed for moderate pain or fever.     albuterol (PROVENTIL) (2.5 MG/3ML) 0.083% nebulizer solution Take 2.5 mg by nebulization in  the morning and at bedtime.     albuterol (VENTOLIN HFA) 108 (90 Base) MCG/ACT inhaler Inhale 2 puffs into the lungs every 6 (six) hours as needed for wheezing or shortness of breath. 8 g 6   amLODipine (NORVASC) 10 MG tablet Take 10 mg by mouth at bedtime.     aspirin EC 81 MG tablet Take 1 tablet (81 mg total) by mouth daily. Swallow whole. 90 tablet 3   atorvastatin (LIPITOR) 40 MG tablet TAKE 1 TABLET(40 MG) BY MOUTH DAILY 90 tablet 3   Budeson-Glycopyrrol-Formoterol (BREZTRI AEROSPHERE) 160-9-4.8 MCG/ACT AERO Inhale 2 puffs into the lungs in the morning and at bedtime. 11.8 g 11   Cholecalciferol (VITAMIN D3) 25 MCG (1000 UT) CAPS 2 capsules daily.     ciprofloxacin (CIPRO) 500 MG tablet Take 1 tablet (500 mg total) by mouth 2 (two) times daily. 20 tablet 0   citalopram (CELEXA) 20 MG tablet TAKE 1 TABLET(20 MG) BY MOUTH AT BEDTIME 90 tablet 0   Cyanocobalamin (B-12 PO) Take 1 capsule by mouth daily.     finasteride (PROSCAR) 5 MG tablet Take 5 mg by mouth daily.     furosemide (LASIX) 20 MG tablet Take 20 mg by mouth 3 (three) times a week.     lidocaine-prilocaine (EMLA) cream Apply 1 application topically as needed. 30 g 0   metoprolol succinate (TOPROL-XL) 25 MG 24 hr tablet Take 25 mg by mouth daily.     oxyCODONE (OXY IR/ROXICODONE) 5 MG immediate release tablet Take 1 tablet (5 mg total) by mouth every 6 (six) hours as needed for moderate pain or severe pain  (Post-operatively). 30 tablet 0   OXYGEN Inhale 2 L into the lungs See admin instructions. 2L when taking a nap and at bedtime     prochlorperazine (COMPAZINE) 10 MG tablet TAKE 1 TABLET(10 MG) BY MOUTH EVERY 6 HOURS AS NEEDED FOR NAUSEA OR VOMITING 30 tablet 0   tamsulosin (FLOMAX) 0.4 MG CAPS capsule Take 2 capsules by mouth at bedtime.     No facility-administered medications prior to visit.   Review of Systems  Constitutional:  Positive for malaise/fatigue and weight loss. Negative for chills and fever.  HENT:  Negative for congestion, nosebleeds, sinus pain and sore throat.   Eyes: Negative.   Respiratory:  Positive for shortness of breath. Negative for cough, hemoptysis, sputum production and wheezing.   Cardiovascular:  Negative for chest pain, palpitations, orthopnea, claudication and leg swelling.  Gastrointestinal:  Negative for abdominal pain, heartburn, nausea and vomiting.  Genitourinary: Negative.   Musculoskeletal:  Negative for joint pain and myalgias.  Skin:  Negative for rash.  Neurological:  Negative for weakness.  Endo/Heme/Allergies: Negative.   Psychiatric/Behavioral: Negative.      Objective:   Vitals:   02/04/22 0935  BP: 114/68  Pulse: 86  SpO2: 96%  Weight: 133 lb (60.3 kg)  Height: '5\' 3"'$  (1.6 m)    Physical Exam Constitutional:      General: He is not in acute distress.    Appearance: Normal appearance.  HENT:     Head: Normocephalic and atraumatic.  Eyes:     Conjunctiva/sclera: Conjunctivae normal.  Cardiovascular:     Rate and Rhythm: Normal rate and regular rhythm.     Pulses: Normal pulses.     Heart sounds: Normal heart sounds. No murmur heard. Pulmonary:     Effort: Pulmonary effort is normal.     Breath sounds: Normal breath sounds. No wheezing, rhonchi or rales.  Musculoskeletal:        General: No deformity.     Right lower leg: No edema.     Left lower leg: No edema.  Skin:    General: Skin is warm and dry.  Neurological:      General: No focal deficit present.     Mental Status: He is alert.  Psychiatric:        Mood and Affect: Mood normal.        Behavior: Behavior normal.        Thought Content: Thought content normal.        Judgment: Judgment normal.    CBC    Component Value Date/Time   WBC 10.7 (H) 01/23/2022 1301   WBC 2.0 (L) 07/09/2021 0350   RBC 3.92 (L) 01/23/2022 1301   HGB 10.7 (L) 01/23/2022 1301   HCT 33.6 (L) 01/23/2022 1301   PLT 496 (H) 01/23/2022 1301   MCV 85.7 01/23/2022 1301   MCH 27.3 01/23/2022 1301   MCHC 31.8 01/23/2022 1301   RDW 18.6 (H) 01/23/2022 1301   LYMPHSABS 1.0 01/23/2022 1301   MONOABS 0.9 01/23/2022 1301   EOSABS 0.2 01/23/2022 1301   BASOSABS 0.1 01/23/2022 1301      Latest Ref Rng & Units 01/23/2022    1:01 PM 01/09/2022   10:02 AM 12/26/2021   12:09 PM  BMP  Glucose 70 - 99 mg/dL 113  107  99   BUN 8 - 23 mg/dL 29  29  32   Creatinine 0.61 - 1.24 mg/dL 1.83  2.01  2.10   Sodium 135 - 145 mmol/L 137  141  139   Potassium 3.5 - 5.1 mmol/L 3.9  3.9  4.1   Chloride 98 - 111 mmol/L 103  108  109   CO2 22 - 32 mmol/L '27  25  23   '$ Calcium 8.9 - 10.3 mg/dL 9.3  9.0  8.8    Chest imaging: CXR 07/06/21 Probable emphysematous changes with subsegmental atelectasis LEFT base.  CXR 03/30/21 Cardiac and mediastinal contours are unchanged. Bilateral linear opacities, likely related to underlying emphysema. No new parenchymal process. No large pleural effusion or evidence of pneumothorax.  CT Lung Cancer Screening 09/27/20 1. Lung-RADS 2, benign appearance or behavior. Continue annual screening with low-dose chest CT without contrast in 12 months. 2.  Emphysema  PFT:    Latest Ref Rng & Units 05/16/2021    2:42 PM  PFT Results  FVC-Pre L 2.15   FVC-Predicted Pre % 62   FVC-Post L 2.26   FVC-Predicted Post % 66   Pre FEV1/FVC % % 52   Post FEV1/FCV % % 53   FEV1-Pre L 1.12   FEV1-Predicted Pre % 44   FEV1-Post L 1.21   DLCO uncorrected ml/min/mmHg  8.22   DLCO UNC% % 38   DLCO corrected ml/min/mmHg 8.22   DLCO COR %Predicted % 38   DLVA Predicted % 47   TLC L 5.55   TLC % Predicted % 95   RV % Predicted % 142   PFTs 2023: Severe obstruction and diffusion defect  EKG 09/15/20 Normal Sinus rhythm, normal intervals, possible LVH  Assessment & Plan:   Centrilobular emphysema (HCC)  Nocturnal hypoxemia due to emphysema [facility])  Discussion: Samuel [name] is a 71 year old male, former smoker with CKDIII, hyperlipidemia and hypertension who returns to pulmonary clinic for COPD follow up.  He has diffuse centrilobular emphysema as demonstrated on CT chest imaging. He has severe  obstruction and severe diffusion defect on PFTs. He has nocturnal hypoxemic requiring 2L of O2 with humidification.  He is to continue Breztri inhaler therapy, 2 puffs twice daily.  He can use albuterol inhaler or nebulizer treatments every every 4-6 hours as needed for shortness of breath and wheezing.   I encouraged him to maintain adequate nutritional intake throughout his chemo treatments along with going for short walks as able throughout the day to maintain his conditioning.   Follow-up in 6 months.   Freda Jackson, MD [facility] Office: [phone]    Current Outpatient Medications:    acetaminophen (TYLENOL) 325 MG tablet, Take 325 mg by mouth every 6 (six) hours as needed for moderate pain or fever., Disp: , Rfl:    albuterol (PROVENTIL) (2.5 MG/3ML) 0.083% nebulizer solution, Take 2.5 mg by nebulization in the morning and at bedtime., Disp: , Rfl:    albuterol (VENTOLIN HFA) 108 (90 Base) MCG/ACT inhaler, Inhale 2 puffs into the lungs every 6 (six) hours as needed for wheezing or shortness of breath., Disp: 8 g, Rfl: 6   amLODipine (NORVASC) 10 MG tablet, Take 10 mg by mouth at bedtime., Disp: , Rfl:    aspirin EC 81 MG tablet, Take 1 tablet (81 mg total) by mouth daily. Swallow whole., Disp: 90 tablet, Rfl: 3    atorvastatin (LIPITOR) 40 MG tablet, TAKE 1 TABLET(40 MG) BY MOUTH DAILY, Disp: 90 tablet, Rfl: 3   Budeson-Glycopyrrol-Formoterol (BREZTRI AEROSPHERE) 160-9-4.8 MCG/ACT AERO, Inhale 2 puffs into the lungs in the morning and at bedtime., Disp: 11.8 g, Rfl: 11   Cholecalciferol (VITAMIN D3) 25 MCG (1000 UT) CAPS, 2 capsules daily., Disp: , Rfl:    ciprofloxacin (CIPRO) 500 MG tablet, Take 1 tablet (500 mg total) by mouth 2 (two) times daily., Disp: 20 tablet, Rfl: 0   citalopram (CELEXA) 20 MG tablet, TAKE 1 TABLET(20 MG) BY MOUTH AT BEDTIME, Disp: 90 tablet, Rfl: 0   Cyanocobalamin (B-12 PO), Take 1 capsule by mouth daily., Disp: , Rfl:    finasteride (PROSCAR) 5 MG tablet, Take 5 mg by mouth daily., Disp: , Rfl:    furosemide (LASIX) 20 MG tablet, Take 20 mg by mouth 3 (three) times a week., Disp: , Rfl:    lidocaine-prilocaine (EMLA) cream, Apply 1 application topically as needed., Disp: 30 g, Rfl: 0   metoprolol succinate (TOPROL-XL) 25 MG 24 hr tablet, Take 25 mg by mouth daily., Disp: , Rfl:    oxyCODONE (OXY IR/ROXICODONE) 5 MG immediate release tablet, Take 1 tablet (5 mg total) by mouth every 6 (six) hours as needed for moderate pain or severe pain (Post-operatively)., Disp: 30 tablet, Rfl: 0   OXYGEN, Inhale 2 L into the lungs See admin instructions. 2L when taking a nap and at bedtime, Disp: , Rfl:    prochlorperazine (COMPAZINE) 10 MG tablet, TAKE 1 TABLET(10 MG) BY MOUTH EVERY 6 HOURS AS NEEDED FOR NAUSEA OR VOMITING, Disp: 30 tablet, Rfl: 0   tamsulosin (FLOMAX) 0.4 MG CAPS capsule, Take 2 capsules by mouth at bedtime., Disp: , Rfl:

## 2022-02-05 ENCOUNTER — Other Ambulatory Visit (HOSPITAL_COMMUNITY): Payer: Self-pay | Admitting: Interventional Radiology

## 2022-02-05 ENCOUNTER — Ambulatory Visit (HOSPITAL_COMMUNITY)
Admission: RE | Admit: 2022-02-05 | Discharge: 2022-02-05 | Disposition: A | Discharge status: Home / Self Care | Payer: Medicare Other | Source: Ambulatory Visit | Attending: Interventional Radiology | Admitting: Interventional Radiology

## 2022-02-05 DIAGNOSIS — C679 Malignant neoplasm of bladder, unspecified: Secondary | ICD-10-CM

## 2022-02-05 DIAGNOSIS — Z436 Encounter for attention to other artificial openings of urinary tract: Secondary | ICD-10-CM | POA: Diagnosis not present

## 2022-02-05 DIAGNOSIS — Z466 Encounter for fitting and adjustment of urinary device: Secondary | ICD-10-CM | POA: Diagnosis not present

## 2022-02-05 HISTORY — PX: IR NEPHROSTOMY EXCHANGE LEFT: IMG6069

## 2022-02-05 HISTORY — PX: IR NEPHROSTOMY EXCHANGE RIGHT: IMG6070

## 2022-02-05 MED ORDER — IOHEXOL 300 MG/ML  SOLN
50.0000 mL | Freq: Once | INTRAMUSCULAR | Status: AC | PRN
  Administered 2022-02-05: 10 mL

## 2022-02-06 ENCOUNTER — Inpatient Hospital Stay: Payer: Medicare Other

## 2022-02-06 ENCOUNTER — Other Ambulatory Visit: Payer: Self-pay

## 2022-02-06 ENCOUNTER — Inpatient Hospital Stay (HOSPITAL_BASED_OUTPATIENT_CLINIC_OR_DEPARTMENT_OTHER): Payer: Medicare Other | Admitting: Oncology

## 2022-02-06 ENCOUNTER — Encounter: Payer: Self-pay | Admitting: Oncology

## 2022-02-06 VITALS — BP 122/68 | HR 92 | Temp 97.7°F | Resp 17 | Ht 63.0 in | Wt 134.9 lb

## 2022-02-06 DIAGNOSIS — Z5111 Encounter for antineoplastic chemotherapy: Secondary | ICD-10-CM | POA: Diagnosis not present

## 2022-02-06 DIAGNOSIS — C679 Malignant neoplasm of bladder, unspecified: Secondary | ICD-10-CM | POA: Diagnosis not present

## 2022-02-06 DIAGNOSIS — C7951 Secondary malignant neoplasm of bone: Secondary | ICD-10-CM | POA: Diagnosis not present

## 2022-02-06 DIAGNOSIS — Z79899 Other long term (current) drug therapy: Secondary | ICD-10-CM | POA: Diagnosis not present

## 2022-02-06 DIAGNOSIS — I2510 Atherosclerotic heart disease of native coronary artery without angina pectoris: Secondary | ICD-10-CM | POA: Diagnosis not present

## 2022-02-06 DIAGNOSIS — D649 Anemia, unspecified: Secondary | ICD-10-CM | POA: Diagnosis not present

## 2022-02-06 DIAGNOSIS — J439 Emphysema, unspecified: Secondary | ICD-10-CM | POA: Diagnosis not present

## 2022-02-06 DIAGNOSIS — I700 Atherosclerosis of aorta: Secondary | ICD-10-CM | POA: Diagnosis not present

## 2022-02-06 DIAGNOSIS — Z95828 Presence of other vascular implants and grafts: Secondary | ICD-10-CM

## 2022-02-06 LAB — CBC WITH DIFFERENTIAL (CANCER CENTER ONLY)
Abs Immature Granulocytes: 0.08 10*3/uL — ABNORMAL HIGH (ref 0.00–0.07)
Basophils Absolute: 0.1 10*3/uL (ref 0.0–0.1)
Basophils Relative: 1 %
Eosinophils Absolute: 0.4 10*3/uL (ref 0.0–0.5)
Eosinophils Relative: 3 %
HCT: 32 % — ABNORMAL LOW (ref 39.0–52.0)
Hemoglobin: 10.2 g/dL — ABNORMAL LOW (ref 13.0–17.0)
Immature Granulocytes: 1 %
Lymphocytes Relative: 6 %
Lymphs Abs: 0.7 10*3/uL (ref 0.7–4.0)
MCH: 26.7 pg (ref 26.0–34.0)
MCHC: 31.9 g/dL (ref 30.0–36.0)
MCV: 83.8 fL (ref 80.0–100.0)
Monocytes Absolute: 1 10*3/uL (ref 0.1–1.0)
Monocytes Relative: 9 %
Neutro Abs: 8.7 10*3/uL — ABNORMAL HIGH (ref 1.7–7.7)
Neutrophils Relative %: 80 %
Platelet Count: 572 10*3/uL — ABNORMAL HIGH (ref 150–400)
RBC: 3.82 MIL/uL — ABNORMAL LOW (ref 4.22–5.81)
RDW: 18.8 % — ABNORMAL HIGH (ref 11.5–15.5)
WBC Count: 10.9 10*3/uL — ABNORMAL HIGH (ref 4.0–10.5)
nRBC: 0 % (ref 0.0–0.2)

## 2022-02-06 LAB — CMP (CANCER CENTER ONLY)
ALT: 10 U/L (ref 0–44)
AST: 20 U/L (ref 15–41)
Albumin: 3.5 g/dL (ref 3.5–5.0)
Alkaline Phosphatase: 118 U/L (ref 38–126)
Anion gap: 8 (ref 5–15)
BUN: 27 mg/dL — ABNORMAL HIGH (ref 8–23)
CO2: 25 mmol/L (ref 22–32)
Calcium: 9 mg/dL (ref 8.9–10.3)
Chloride: 105 mmol/L (ref 98–111)
Creatinine: 1.81 mg/dL — ABNORMAL HIGH (ref 0.61–1.24)
GFR, Estimated: 39 mL/min — ABNORMAL LOW (ref >60)
Glucose, Bld: 103 mg/dL — ABNORMAL HIGH (ref 70–99)
Potassium: 3.7 mmol/L (ref 3.5–5.1)
Sodium: 138 mmol/L (ref 135–145)
Total Bilirubin: 0.5 mg/dL (ref 0.3–1.2)
Total Protein: 7.2 g/dL (ref 6.5–8.1)

## 2022-02-06 LAB — TSH: TSH: 2.242 u[IU]/mL (ref 0.350–4.500)

## 2022-02-06 MED ORDER — SODIUM CHLORIDE 0.9% FLUSH
10.0000 mL | Freq: Once | INTRAVENOUS | Status: AC
  Administered 2022-02-06: 10 mL

## 2022-02-06 MED ORDER — SODIUM CHLORIDE 0.9 % IV SOLN: Freq: Once | INTRAVENOUS | Status: AC

## 2022-02-06 MED ORDER — SODIUM CHLORIDE 0.9% FLUSH
10.0000 mL | INTRAVENOUS | Status: DC | PRN
  Administered 2022-02-06: 10 mL

## 2022-02-06 MED ORDER — HEPARIN SOD (PORK) LOCK FLUSH 100 UNIT/ML IV SOLN
500.0000 [IU] | Freq: Once | INTRAVENOUS | Status: AC | PRN
  Administered 2022-02-06: 500 [IU]

## 2022-02-06 MED ORDER — PROCHLORPERAZINE MALEATE 10 MG PO TABS
10.0000 mg | ORAL_TABLET | Freq: Once | ORAL | Status: AC
  Administered 2022-02-06: 10 mg via ORAL
  Filled 2022-02-06: qty 1

## 2022-02-06 MED ORDER — SODIUM CHLORIDE 0.9 % IV SOLN
0.9500 mg/kg | Freq: Once | INTRAVENOUS | Status: AC
  Administered 2022-02-06: 60 mg via INTRAVENOUS
  Filled 2022-02-06: qty 6

## 2022-02-06 NOTE — Progress Notes (Signed)
Hematology and Oncology Follow Up Visit  Samuel [name] [id number] 31-May-1950 71 y.o. 02/06/2022 8:19 AM Samuel [name], Samuel [name], Samuel Rumps, NP   Principle Diagnosis: 71 year old with bladder cancer diagnosed in June 2022.  He subsequently developed stage IV high-grade urothelial carcinoma with pelvic adenopathy in December 2022.    Prior Therapy:  He is status post BCG treatment and June 2022.  Carboplatin and gemcitabine started on June 20, 2021.   He completed 3 cycles of therapy in March 2023.  Pembrolizumab 200 mg every 3 weeks started on August 22, 2021.   He completed 4 cycles of therapy.  Current therapy: Padcev 1 mg/kg cycle 1 started on November 14, 2021.  He is here for day 1 cycle 4  Interim History: Samuel [name] presents today for a follow-up visit.  Since last visit, he reports no major changes in his health.  He has described more functional decline.  His performance status is worse and spending more more time in bed and in a chair.  He denies any chest pain or shortness of breath or difficulty breathing.  His appetite is poor and lost more weight.    Medications: Updated on review. Current Outpatient Medications  Medication Sig Dispense Refill   acetaminophen (TYLENOL) 325 MG tablet Take 325 mg by mouth every 6 (six) hours as needed for moderate pain or fever.     albuterol (PROVENTIL) (2.5 MG/3ML) 0.083% nebulizer solution Take 2.5 mg by nebulization in the morning and at bedtime.     albuterol (VENTOLIN HFA) 108 (90 Base) MCG/ACT inhaler Inhale 2 puffs into the lungs every 6 (six) hours as needed for wheezing or shortness of breath. 8 g 6   amLODipine (NORVASC) 10 MG tablet Take 10 mg by mouth at bedtime.     aspirin EC 81 MG tablet Take 1 tablet (81 mg total) by mouth daily. Swallow whole. 90 tablet 3   atorvastatin (LIPITOR) 40 MG tablet TAKE 1 TABLET(40 MG) BY MOUTH DAILY 90 tablet 3   Budeson-Glycopyrrol-Formoterol (BREZTRI AEROSPHERE) 160-9-4.8 MCG/ACT  AERO Inhale 2 puffs into the lungs in the morning and at bedtime. 11.8 g 11   Cholecalciferol (VITAMIN D3) 25 MCG (1000 UT) CAPS 2 capsules daily.     ciprofloxacin (CIPRO) 500 MG tablet Take 1 tablet (500 mg total) by mouth 2 (two) times daily. 20 tablet 0   citalopram (CELEXA) 20 MG tablet TAKE 1 TABLET(20 MG) BY MOUTH AT BEDTIME 90 tablet 0   Cyanocobalamin (B-12 PO) Take 1 capsule by mouth daily.     finasteride (PROSCAR) 5 MG tablet Take 5 mg by mouth daily.     furosemide (LASIX) 20 MG tablet Take 20 mg by mouth 3 (three) times a week.     lidocaine-prilocaine (EMLA) cream Apply 1 application topically as needed. 30 g 0   metoprolol succinate (TOPROL-XL) 25 MG 24 hr tablet Take 25 mg by mouth daily.     oxyCODONE (OXY IR/ROXICODONE) 5 MG immediate release tablet Take 1 tablet (5 mg total) by mouth every 6 (six) hours as needed for moderate pain or severe pain (Post-operatively). 30 tablet 0   OXYGEN Inhale 2 L into the lungs See admin instructions. 2L when taking a nap and at bedtime     prochlorperazine (COMPAZINE) 10 MG tablet TAKE 1 TABLET(10 MG) BY MOUTH EVERY 6 HOURS AS NEEDED FOR NAUSEA OR VOMITING 30 tablet 0   tamsulosin (FLOMAX) 0.4 MG CAPS capsule Take 2 capsules by mouth at bedtime.  No current facility-administered medications for this visit.     Allergies:  Allergies  Allergen Reactions   Penicillins Diarrhea and Nausea And Vomiting    Did it involve swelling of the face/tongue/throat, SOB, or low BP? No Did it involve sudden or severe rash/hives, skin peeling, or any reaction on the inside of your mouth or nose?  Did you need to seek medical attention at a hospital or doctor's office? No When did it last happen?    Several Years Ago   If all above answers are "NO", may proceed with cephalosporin use.        Physical Exam:     Blood pressure 122/68, pulse 92, temperature 97.7 F (36.5 C), temperature source Tympanic, resp. rate 17, height '5\' 3"'$  (1.6 m),  weight 134 lb 14.4 oz (61.2 kg), SpO2 96 %.      ECOG: 1   General appearance: Alert, awake without any distress. Head: Atraumatic without abnormalities Oropharynx: Without any thrush or ulcers. Eyes: No scleral icterus. Lymph nodes: No lymphadenopathy noted in the cervical, supraclavicular, or axillary nodes Heart:regular rate and rhythm, without any murmurs or gallops.   Lung: Clear to auscultation without any rhonchi, wheezes or dullness to percussion. Abdomin: Soft, nontender without any shifting dullness or ascites. Musculoskeletal: No clubbing or cyanosis. Neurological: No motor or sensory deficits. Skin: No rashes or lesions.    .      Lab Results: Lab Results  Component Value Date   WBC 10.7 (H) 01/23/2022   HGB 10.7 (L) 01/23/2022   HCT 33.6 (L) 01/23/2022   MCV 85.7 01/23/2022   PLT 496 (H) 01/23/2022   PSA 3.13 04/04/2020     Chemistry      Component Value Date/Time   NA 137 01/23/2022 1301   K 3.9 01/23/2022 1301   CL 103 01/23/2022 1301   CO2 27 01/23/2022 1301   BUN 29 (H) 01/23/2022 1301   CREATININE 1.83 (H) 01/23/2022 1301   CREATININE 2.14 (H) 04/04/2020 0748      Component Value Date/Time   CALCIUM 9.3 01/23/2022 1301   ALKPHOS 117 01/23/2022 1301   AST 16 01/23/2022 1301   ALT 10 01/23/2022 1301   BILITOT 0.5 01/23/2022 1301      IMPRESSION: 1. Again seen is widespread tracer avid osseous metastatic disease. Compared with the previous exam there has been no significant interval change in the degree of FDG uptake associated with the index lesions. Multiplicity of these lesions appears similar to the prior study. 2. No significant change in tracer nodal metastasis within the abdomen and pelvis. 3. Similar appearance of FDG avid bladder wall thickening compatible with known primary bladder malignancy. 4. Aortic Atherosclerosis (ICD10-I70.0) and Emphysema (ICD10-J43.9). 5. Coronary artery calcifications. 6. Bilateral nephrostomy  tubes are in place without signs of hydronephrosis.     Impression and Plan:   71 year old with:   1.  Stage IV high-grade urothelial carcinoma of the bladder documented in December 2022.   He is currently on salvage Padcev without any major complications.  PET scan obtained on 01/28/2022 was personally reviewed and showed overall stable disease.  Risks and benefits of continuing this therapy were discussed.  Transitioning to oral targeted therapy with an FGFR inhibitor versus supportive care were discussed.  After discussion today, we opted to continue with this treatment and will switch to every other week for better tolerance.  Transitioning to hospice would be the next step if she continues to decline.  He is wanting to continue  with treatment for the time being but understands his prognosis is poor with limited life expectancy.   2.  IV access: Port-A-Cath currently in use without any issues.   3.  Antiemetics: No nausea or vomiting reported at this time.  Compazine is available to him.   4.  Chronic Kidney disease: Kidney function remains stable.  Creatinine clearance continues to be around 40 cc/min without any further decline.   5.  Goals of care: Treatment is palliative at this time his prognosis is guarded given the volume of disease.  6.  Anemia: Hemoglobin remains adequate without any need for transfusion.  7.  Bone pain: He is currently on oxycodone with pain is manageable.  His pain is related to advanced prostate cancer.  8.  Anorexia: We have discussed strategies to improve his nutritional intake.  He has an appointment with nutritionist in the near future.  9.  Follow-up: In 2 weeks to complete current cycles and in 4 weeks for the beginning of the next cycle.   30  minutes were dedicated to this encounter.  The time was spent on updating his disease status, treatment choices and outlining future plan of care review.     Zola Button, MD 9/27/20238:19 AM

## 2022-02-06 NOTE — Progress Notes (Signed)
Per Dr Alen Blew, ok to proceed with creat 1.'81mg'$ /dL today

## 2022-02-06 NOTE — Patient Instructions (Signed)
[facility] MEDICAL ONCOLOGY  Discharge Instructions: Thank you for choosing [facility] to provide your oncology and hematology care.   If you have a lab appointment with the Cancer Center, please go directly to the Cancer Center and check in at the registration area.   Wear comfortable clothing and clothing appropriate for easy access to any Portacath or PICC line.   We strive to give you quality time with your provider. You may need to reschedule your appointment if you arrive late (15 or more minutes).  Arriving late affects you and other patients whose appointments are after yours.  Also, if you miss three or more appointments without notifying the office, you may be dismissed from the clinic at the provider's discretion.      For prescription refill requests, have your pharmacy contact our office and allow 72 hours for refills to be completed.    Today you received the following chemotherapy and/or immunotherapy agents Enfortumab      To help prevent nausea and vomiting after your treatment, we encourage you to take your nausea medication as directed.  BELOW ARE SYMPTOMS THAT SHOULD BE REPORTED IMMEDIATELY: *FEVER GREATER THAN 100.4 F (38 C) OR HIGHER *CHILLS OR SWEATING *NAUSEA AND VOMITING THAT IS NOT CONTROLLED WITH YOUR NAUSEA MEDICATION *UNUSUAL SHORTNESS OF BREATH *UNUSUAL BRUISING OR BLEEDING *URINARY PROBLEMS (pain or burning when urinating, or frequent urination) *BOWEL PROBLEMS (unusual diarrhea, constipation, pain near the anus) TENDERNESS IN MOUTH AND THROAT WITH OR WITHOUT PRESENCE OF ULCERS (sore throat, sores in mouth, or a toothache) UNUSUAL RASH, SWELLING OR PAIN  UNUSUAL VAGINAL DISCHARGE OR ITCHING   Items with * indicate a potential emergency and should be followed up as soon as possible or go to the Emergency Department if any problems should occur.  Please show the CHEMOTHERAPY ALERT CARD or IMMUNOTHERAPY ALERT CARD at check-in to  the Emergency Department and triage nurse.  Should you have questions after your visit or need to cancel or reschedule your appointment, please contact [facility]COL[facility]  Dept: [phone]  and follow the prompts.  Office hours are 8:00 a.m. to 4:30 p.m. Monday - Friday. Please note that voicemails left after 4:00 p.m. may not be returned until the following business day.  We are closed weekends and major holidays. You have access to a nurse at all times for urgent questions. Please call the main number to the clinic Dept: [phone] and follow the prompts.   For any non-urgent questions, you may also contact your provider using MyChart. We now offer e-Visits for anyone 18 and older to request care online for non-urgent symptoms. For details visit [email or web address].   Also download the MyChart app! Go to the app store, search "MyChart", open the app, select [facility], and log in with your MyChart username and password.  Masks are optional in the cancer centers. If you would like for your care team to wear a mask while they are taking care of you, please let them know. You may have one support person who is at least 72 years old accompany you for your appointments. 

## 2022-02-11 DIAGNOSIS — J9611 Chronic respiratory failure with hypoxia: Secondary | ICD-10-CM | POA: Diagnosis not present

## 2022-02-13 ENCOUNTER — Inpatient Hospital Stay: Payer: Medicare Other

## 2022-02-20 ENCOUNTER — Inpatient Hospital Stay: Payer: Medicare Other | Attending: Oncology

## 2022-02-20 ENCOUNTER — Encounter: Payer: Self-pay | Admitting: Oncology

## 2022-02-20 ENCOUNTER — Inpatient Hospital Stay: Payer: Medicare Other

## 2022-02-20 ENCOUNTER — Inpatient Hospital Stay: Payer: Medicare Other | Admitting: Dietician

## 2022-02-20 VITALS — BP 122/68 | HR 75 | Temp 98.2°F | Resp 16 | Wt 133.8 lb

## 2022-02-20 DIAGNOSIS — C679 Malignant neoplasm of bladder, unspecified: Secondary | ICD-10-CM | POA: Insufficient documentation

## 2022-02-20 DIAGNOSIS — Z79899 Other long term (current) drug therapy: Secondary | ICD-10-CM | POA: Diagnosis not present

## 2022-02-20 DIAGNOSIS — Z5111 Encounter for antineoplastic chemotherapy: Secondary | ICD-10-CM | POA: Insufficient documentation

## 2022-02-20 DIAGNOSIS — D630 Anemia in neoplastic disease: Secondary | ICD-10-CM | POA: Diagnosis not present

## 2022-02-20 DIAGNOSIS — Z95828 Presence of other vascular implants and grafts: Secondary | ICD-10-CM

## 2022-02-20 DIAGNOSIS — R630 Anorexia: Secondary | ICD-10-CM | POA: Insufficient documentation

## 2022-02-20 DIAGNOSIS — G893 Neoplasm related pain (acute) (chronic): Secondary | ICD-10-CM | POA: Insufficient documentation

## 2022-02-20 LAB — CMP (CANCER CENTER ONLY)
ALT: 9 U/L (ref 0–44)
AST: 15 U/L (ref 15–41)
Albumin: 3 g/dL — ABNORMAL LOW (ref 3.5–5.0)
Alkaline Phosphatase: 116 U/L (ref 38–126)
Anion gap: 10 (ref 5–15)
BUN: 33 mg/dL — ABNORMAL HIGH (ref 8–23)
CO2: 24 mmol/L (ref 22–32)
Calcium: 8.8 mg/dL — ABNORMAL LOW (ref 8.9–10.3)
Chloride: 104 mmol/L (ref 98–111)
Creatinine: 1.76 mg/dL — ABNORMAL HIGH (ref 0.61–1.24)
GFR, Estimated: 41 mL/min — ABNORMAL LOW (ref >60)
Glucose, Bld: 98 mg/dL (ref 70–99)
Potassium: 4.4 mmol/L (ref 3.5–5.1)
Sodium: 138 mmol/L (ref 135–145)
Total Bilirubin: 0.3 mg/dL (ref 0.3–1.2)
Total Protein: 7.4 g/dL (ref 6.5–8.1)

## 2022-02-20 LAB — CBC WITH DIFFERENTIAL (CANCER CENTER ONLY)
Abs Immature Granulocytes: 0.11 10*3/uL — ABNORMAL HIGH (ref 0.00–0.07)
Basophils Absolute: 0.1 10*3/uL (ref 0.0–0.1)
Basophils Relative: 1 %
Eosinophils Absolute: 0.4 10*3/uL (ref 0.0–0.5)
Eosinophils Relative: 4 %
HCT: 31.2 % — ABNORMAL LOW (ref 39.0–52.0)
Hemoglobin: 9.9 g/dL — ABNORMAL LOW (ref 13.0–17.0)
Immature Granulocytes: 1 %
Lymphocytes Relative: 10 %
Lymphs Abs: 0.9 10*3/uL (ref 0.7–4.0)
MCH: 26.8 pg (ref 26.0–34.0)
MCHC: 31.7 g/dL (ref 30.0–36.0)
MCV: 84.6 fL (ref 80.0–100.0)
Monocytes Absolute: 1.3 10*3/uL — ABNORMAL HIGH (ref 0.1–1.0)
Monocytes Relative: 13 %
Neutro Abs: 7.1 10*3/uL (ref 1.7–7.7)
Neutrophils Relative %: 71 %
Platelet Count: 609 10*3/uL — ABNORMAL HIGH (ref 150–400)
RBC: 3.69 MIL/uL — ABNORMAL LOW (ref 4.22–5.81)
RDW: 18.9 % — ABNORMAL HIGH (ref 11.5–15.5)
WBC Count: 9.8 10*3/uL (ref 4.0–10.5)
nRBC: 0 % (ref 0.0–0.2)

## 2022-02-20 MED ORDER — SODIUM CHLORIDE 0.9 % IV SOLN
0.9500 mg/kg | Freq: Once | INTRAVENOUS | Status: AC
  Administered 2022-02-20: 60 mg via INTRAVENOUS
  Filled 2022-02-20: qty 6

## 2022-02-20 MED ORDER — SODIUM CHLORIDE 0.9 % IV SOLN: Freq: Once | INTRAVENOUS | Status: AC

## 2022-02-20 MED ORDER — SODIUM CHLORIDE 0.9% FLUSH
10.0000 mL | Freq: Once | INTRAVENOUS | Status: AC
  Administered 2022-02-20: 10 mL

## 2022-02-20 MED ORDER — HEPARIN SOD (PORK) LOCK FLUSH 100 UNIT/ML IV SOLN
500.0000 [IU] | Freq: Once | INTRAVENOUS | Status: AC | PRN
  Administered 2022-02-20: 500 [IU]

## 2022-02-20 MED ORDER — PROCHLORPERAZINE MALEATE 10 MG PO TABS
10.0000 mg | ORAL_TABLET | Freq: Once | ORAL | Status: AC
  Administered 2022-02-20: 10 mg via ORAL
  Filled 2022-02-20: qty 1

## 2022-02-20 MED ORDER — SODIUM CHLORIDE 0.9% FLUSH
10.0000 mL | INTRAVENOUS | Status: DC | PRN
  Administered 2022-02-20: 10 mL

## 2022-02-20 NOTE — Progress Notes (Signed)
Per Dr. Alen Blew, okay to proceed with creatinine of 1.76 today.

## 2022-02-20 NOTE — Patient Instructions (Signed)
[facility] MEDICAL ONCOLOG[facility]  Discharge Instructions: Thank you for choosing [facility] to provide your oncology and hematology care.   If you have a lab appointment with the Cancer Center, please go directly to the Cancer Center and check in at the registration area.   Wear comfortable clothing and clothing appropriate for easy access to any Portacath or PICC line.   We strive to give you quality time with your provider. You may need to reschedule your appointment if you arrive late (15 or more minutes).  Arriving late affects you and other patients whose appointments are after yours.  Also, if you miss three or more appointments without notifying the office, you may be dismissed from the clinic at the provider's discretion.      For prescription refill requests, have your pharmacy contact our office and allow 72 hours for refills to be completed.    Today you received the following chemotherapy and/or immunotherapy agents: Padcev      To help prevent nausea and vomiting after your treatment, we encourage you to take your nausea medication as directed.  BELOW ARE SYMPTOMS THAT SHOULD BE REPORTED IMMEDIATELY: *FEVER GREATER THAN 100.4 F (38 C) OR HIGHER *CHILLS OR SWEATING *NAUSEA AND VOMITING THAT IS NOT CONTROLLED WITH YOUR NAUSEA MEDICATION *UNUSUAL SHORTNESS OF BREATH *UNUSUAL BRUISING OR BLEEDING *URINARY PROBLEMS (pain or burning when urinating, or frequent urination) *BOWEL PROBLEMS (unusual diarrhea, constipation, pain near the anus) TENDERNESS IN MOUTH AND THROAT WITH OR WITHOUT PRESENCE OF ULCERS (sore throat, sores in mouth, or a toothache) UNUSUAL RASH, SWELLING OR PAIN  UNUSUAL VAGINAL DISCHARGE OR ITCHING   Items with * indicate a potential emergency and should be followed up as soon as possible or go to the Emergency Department if any problems should occur.  Please show the CHEMOTHERAPY ALERT CARD or IMMUNOTHERAPY ALERT CARD at check-in to the  Emergency Department and triage nurse.  Should you have questions after your visit or need to cancel or reschedule your appointment, please contact [facility]COL[facility]  Dept: [phone]  and follow the prompts.  Office hours are 8:00 a.m. to 4:30 p.m. Monday - Friday. Please note that voicemails left after 4:00 p.m. may not be returned until the following business day.  We are closed weekends and major holidays. You have access to a nurse at all times for urgent questions. Please call the main number to the clinic Dept: [phone] and follow the prompts.   For any non-urgent questions, you may also contact your provider using MyChart. We now offer e-Visits for anyone 18 and older to request care online for non-urgent symptoms. For details visit [email or web address].   Also download the MyChart app! Go to the app store, search "MyChart", open the app, select [facility], and log in with your MyChart username and password.  Masks are optional in the cancer centers. If you would like for your care team to wear a mask while they are taking care of you, please let them know. You may have one support person who is at least 72 years old accompany you for your appointments. 

## 2022-02-20 NOTE — Progress Notes (Signed)
Nutrition Follow-up:  Patient with stage IV bladder cancer. He completed 3 cycles carboplatin/gemcitabine in March 2023 followed by 4 cycles of Keytruda. Patient is currently receiving Padcev (started 7/5).  Met with patient and wife in infusion. He says appetite isn't that great. His taste is off and can frequently changes. Patient has not tried baking soda salt water rinses. Wife reports handout and recipe for rinse are on the fridge at home. Patient continues to drink Boost. He denies nausea, vomiting, diarrhea, constipation.  Medications: reviewed  Labs: BUN 33, Cr 1.76  Anthropometrics: No new wts at this time for review. On 9/27 pt weighed 134 lb 14.4 oz  8/30 - 138 lb 12.8 oz    NUTRITION DIAGNOSIS: Food and nutrition related knowledge deficit   INTERVENTION:  Suggested trying baking soda salt water rinses several times daily and before meals Discussed strategies for altered taste - pt has handout Continue drinking Boost Plus/equivalent, recommend increasing TID Discussed strategies for poor appetite Infusion RN to obtain pt weight     MONITORING, EVALUATION, GOAL: weight trends, intake   NEXT VISIT: Wednesday November 8 during infusion

## 2022-03-02 ENCOUNTER — Encounter: Payer: Self-pay | Admitting: Oncology

## 2022-03-06 ENCOUNTER — Inpatient Hospital Stay (HOSPITAL_BASED_OUTPATIENT_CLINIC_OR_DEPARTMENT_OTHER): Payer: Medicare Other | Admitting: Oncology

## 2022-03-06 ENCOUNTER — Inpatient Hospital Stay: Payer: Medicare Other

## 2022-03-06 ENCOUNTER — Other Ambulatory Visit: Payer: Self-pay

## 2022-03-06 VITALS — BP 116/69 | HR 71 | Resp 18

## 2022-03-06 DIAGNOSIS — C679 Malignant neoplasm of bladder, unspecified: Secondary | ICD-10-CM

## 2022-03-06 DIAGNOSIS — Z5111 Encounter for antineoplastic chemotherapy: Secondary | ICD-10-CM | POA: Diagnosis not present

## 2022-03-06 DIAGNOSIS — Z79899 Other long term (current) drug therapy: Secondary | ICD-10-CM | POA: Diagnosis not present

## 2022-03-06 DIAGNOSIS — G893 Neoplasm related pain (acute) (chronic): Secondary | ICD-10-CM | POA: Diagnosis not present

## 2022-03-06 DIAGNOSIS — D630 Anemia in neoplastic disease: Secondary | ICD-10-CM | POA: Diagnosis not present

## 2022-03-06 DIAGNOSIS — Z95828 Presence of other vascular implants and grafts: Secondary | ICD-10-CM

## 2022-03-06 LAB — CMP (CANCER CENTER ONLY)
ALT: 11 U/L (ref 0–44)
AST: 14 U/L — ABNORMAL LOW (ref 15–41)
Albumin: 3.4 g/dL — ABNORMAL LOW (ref 3.5–5.0)
Alkaline Phosphatase: 137 U/L — ABNORMAL HIGH (ref 38–126)
Anion gap: 10 (ref 5–15)
BUN: 30 mg/dL — ABNORMAL HIGH (ref 8–23)
CO2: 25 mmol/L (ref 22–32)
Calcium: 9.2 mg/dL (ref 8.9–10.3)
Chloride: 104 mmol/L (ref 98–111)
Creatinine: 1.64 mg/dL — ABNORMAL HIGH (ref 0.61–1.24)
GFR, Estimated: 44 mL/min — ABNORMAL LOW (ref >60)
Glucose, Bld: 99 mg/dL (ref 70–99)
Potassium: 4 mmol/L (ref 3.5–5.1)
Sodium: 139 mmol/L (ref 135–145)
Total Bilirubin: 0.3 mg/dL (ref 0.3–1.2)
Total Protein: 7.1 g/dL (ref 6.5–8.1)

## 2022-03-06 LAB — CBC WITH DIFFERENTIAL (CANCER CENTER ONLY)
Abs Immature Granulocytes: 0.1 10*3/uL — ABNORMAL HIGH (ref 0.00–0.07)
Basophils Absolute: 0.1 10*3/uL (ref 0.0–0.1)
Basophils Relative: 1 %
Eosinophils Absolute: 0.3 10*3/uL (ref 0.0–0.5)
Eosinophils Relative: 3 %
HCT: 31 % — ABNORMAL LOW (ref 39.0–52.0)
Hemoglobin: 9.9 g/dL — ABNORMAL LOW (ref 13.0–17.0)
Immature Granulocytes: 1 %
Lymphocytes Relative: 8 %
Lymphs Abs: 0.7 10*3/uL (ref 0.7–4.0)
MCH: 27 pg (ref 26.0–34.0)
MCHC: 31.9 g/dL (ref 30.0–36.0)
MCV: 84.7 fL (ref 80.0–100.0)
Monocytes Absolute: 1.2 10*3/uL — ABNORMAL HIGH (ref 0.1–1.0)
Monocytes Relative: 12 %
Neutro Abs: 7.3 10*3/uL (ref 1.7–7.7)
Neutrophils Relative %: 75 %
Platelet Count: 598 10*3/uL — ABNORMAL HIGH (ref 150–400)
RBC: 3.66 MIL/uL — ABNORMAL LOW (ref 4.22–5.81)
RDW: 18.7 % — ABNORMAL HIGH (ref 11.5–15.5)
WBC Count: 9.7 10*3/uL (ref 4.0–10.5)
nRBC: 0 % (ref 0.0–0.2)

## 2022-03-06 LAB — TSH: TSH: 2.881 u[IU]/mL (ref 0.350–4.500)

## 2022-03-06 MED ORDER — SODIUM CHLORIDE 0.9 % IV SOLN
0.4800 mg/kg | Freq: Once | INTRAVENOUS | Status: AC
  Administered 2022-03-06: 30 mg via INTRAVENOUS
  Filled 2022-03-06: qty 3

## 2022-03-06 MED ORDER — SODIUM CHLORIDE 0.9% FLUSH
10.0000 mL | Freq: Once | INTRAVENOUS | Status: AC
  Administered 2022-03-06: 10 mL

## 2022-03-06 MED ORDER — SODIUM CHLORIDE 0.9 % IV SOLN: Freq: Once | INTRAVENOUS | Status: AC

## 2022-03-06 MED ORDER — HEPARIN SOD (PORK) LOCK FLUSH 100 UNIT/ML IV SOLN
500.0000 [IU] | Freq: Once | INTRAVENOUS | Status: AC | PRN
  Administered 2022-03-06: 500 [IU]

## 2022-03-06 MED ORDER — PROCHLORPERAZINE MALEATE 10 MG PO TABS
10.0000 mg | ORAL_TABLET | Freq: Once | ORAL | Status: AC
  Administered 2022-03-06: 10 mg via ORAL
  Filled 2022-03-06: qty 1

## 2022-03-06 MED ORDER — SODIUM CHLORIDE 0.9% FLUSH
10.0000 mL | INTRAVENOUS | Status: DC | PRN
  Administered 2022-03-06: 10 mL

## 2022-03-06 NOTE — Progress Notes (Signed)
Per Dr. Alen Blew ok to proceed with tx with Scr 1.64.

## 2022-03-06 NOTE — Progress Notes (Signed)
Hematology and Oncology Follow Up Visit  Samuel [name] [id number] [birth year] 71 y.o. 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    Prior Therapy:  He is status post BCG treatment and June 2022.  Carboplatin and gemcitabine started on June 20, 2021.   He completed 3 cycles of therapy in March 2023.  Pembrolizumab 200 mg every 3 weeks started on August 22, 2021.   He completed 4 cycles of therapy.  Current therapy: Padcev 1 mg/kg cycle 1 started on November 14, 2021.  He is here for day 1 cycle 5 and currently receiving it on day 1 and day 15 only.  Interim History: Samuel [name] returns today for repeat follow-up.  Since last visit, he reports a continuous decline in his health and performance status.  He denies any nausea, vomiting or abdominal pain.  He denies any worsening bone pain or pathological fractures.  He does use oxycodone periodically with excellent control of his pain.  He has reported overall decline in his appetite and spending more time in bed.    Medications: Reviewed without changes. Current Outpatient Medications  Medication Sig Dispense Refill   acetaminophen (TYLENOL) 325 MG tablet Take 325 mg by mouth every 6 (six) hours as needed for moderate pain or fever.     albuterol (PROVENTIL) (2.5 MG/3ML) 0.083% nebulizer solution Take 2.5 mg by nebulization in the morning and at bedtime.     albuterol (VENTOLIN HFA) 108 (90 Base) MCG/ACT inhaler Inhale 2 puffs into the lungs every 6 (six) hours as needed for wheezing or shortness of breath. 8 g 6   amLODipine (NORVASC) 10 MG tablet Take 10 mg by mouth at bedtime.     aspirin EC 81 MG tablet Take 1 tablet (81 mg total) by mouth daily. Swallow whole. 90 tablet 3   atorvastatin (LIPITOR) 40 MG tablet TAKE 1 TABLET(40 MG) BY MOUTH DAILY 90 tablet 3    Budeson-Glycopyrrol-Formoterol (BREZTRI AEROSPHERE) 160-9-4.8 MCG/ACT AERO Inhale 2 puffs into the lungs in the morning and at bedtime. 11.8 g 11   Cholecalciferol (VITAMIN D3) 25 MCG (1000 UT) CAPS 2 capsules daily.     ciprofloxacin (CIPRO) 500 MG tablet Take 1 tablet (500 mg total) by mouth 2 (two) times daily. 20 tablet 0   citalopram (CELEXA) 20 MG tablet TAKE 1 TABLET(20 MG) BY MOUTH AT BEDTIME 90 tablet 0   Cyanocobalamin (B-12 PO) Take 1 capsule by mouth daily.     finasteride (PROSCAR) 5 MG tablet Take 5 mg by mouth daily.     furosemide (LASIX) 20 MG tablet Take 20 mg by mouth 3 (three) times a week.     lidocaine-prilocaine (EMLA) cream Apply 1 application topically as needed. 30 g 0   metoprolol succinate (TOPROL-XL) 25 MG 24 hr tablet Take 25 mg by mouth daily.     oxyCODONE (OXY IR/ROXICODONE) 5 MG immediate release tablet Take 1 tablet (5 mg total) by mouth every 6 (six) hours as needed for moderate pain or severe pain (Post-operatively). 30 tablet 0   OXYGEN Inhale 2 L into the lungs See admin instructions. 2L when taking a nap and at bedtime     prochlorperazine (COMPAZINE) 10 MG tablet TAKE 1 TABLET(10 MG) BY MOUTH EVERY 6 HOURS AS NEEDED FOR NAUSEA OR VOMITING 30 tablet 0   tamsulosin (FLOMAX) 0.4 MG CAPS capsule Take 2  capsules by mouth at bedtime.     No current facility-administered medications for this visit.     Allergies:  Allergies  Allergen Reactions   Penicillins Diarrhea and Nausea And Vomiting    Did it involve swelling of the face/tongue/throat, SOB, or low BP? No Did it involve sudden or severe rash/hives, skin peeling, or any reaction on the inside of your mouth or nose?  Did you need to seek medical attention at a hospital or doctor's office? No When did it last happen?    Several Years Ago   If all above answers are "NO", may proceed with cephalosporin use.        Physical Exam:    Blood pressure 132/80, pulse 86, temperature 97.7 F (36.5 C),  temperature source Tympanic, weight 132 lb 12.8 oz (60.2 kg), SpO2 96 %.      ECOG: 1    General appearance: Comfortable appearing without any discomfort Head: Normocephalic without any trauma Oropharynx: Mucous membranes are moist and pink without any thrush or ulcers. Eyes: Pupils are equal and round reactive to light. Lymph nodes: No cervical, supraclavicular, inguinal or axillary lymphadenopathy.   Heart:regular rate and rhythm.  S1 and S2 without leg edema. Lung: Clear without any rhonchi or wheezes.  No dullness to percussion. Abdomin: Soft, nontender, nondistended with good bowel sounds.  No hepatosplenomegaly. Musculoskeletal: No joint deformity or effusion.  Full range of motion noted. Neurological: No deficits noted on motor, sensory and deep tendon reflex exam. Skin: No petechial rash or dryness.  Appeared moist.      .      Lab Results: Lab Results  Component Value Date   WBC 9.8 02/20/2022   HGB 9.9 (L) 02/20/2022   HCT 31.2 (L) 02/20/2022   MCV 84.6 02/20/2022   PLT 609 (H) 02/20/2022   PSA 3.13 04/04/2020     Chemistry      Component Value Date/Time   NA 138 02/20/2022 1333   K 4.4 02/20/2022 1333   CL 104 02/20/2022 1333   CO2 24 02/20/2022 1333   BUN 33 (H) 02/20/2022 1333   CREATININE 1.76 (H) 02/20/2022 1333   CREATININE 2.14 (H) 04/04/2020 0748      Component Value Date/Time   CALCIUM 8.8 (L) 02/20/2022 1333   ALKPHOS 116 02/20/2022 1333   AST 15 02/20/2022 1333   ALT 9 02/20/2022 1333   BILITOT 0.3 02/20/2022 1333         Impression and Plan:   71 year old with:   1.  Bladder cancer diagnosed in 2022.  He developed stage IV high-grade urothelial carcinoma with pelvic adenopathy subsequently.  Risks and benefits of continued palliative chemotherapy were discussed at this time.  Complication associated with Padcev including neuropathy, fatigue and hyperglycemia were reiterated.  Alternative treatment options would be switching  to supportive care and hospice if he has further decline in his performance status.  After discussion today, he is agreeable to proceed with the treatment today with a dose reduction.  We will reevaluate him in 4 weeks to assess whether transitioning to hospice would be his best option.   2.  IV access: Port-A-Cath will be in use without any issues at this time.   3.  Antiemetics: Compazine is available to him without any nausea or vomiting.   4.  Chronic Kidney disease: His creatinine clearance continues to be stable around 40 cc/min.   5.  Goals of care: His disease is incurable although aggressive measures are warranted based on  reasonable performance status.  6.  Anemia: Related to malignancy and renal insufficiency.  Hemoglobin is adequate and does not require any additional treatment.  7.  Bone pain: This is related to advanced malignancy.  He is currently on oxycodone.  8.  Anorexia: We have discussed strategies to improve his intake at this time.  9.  Follow-up: In 4 weeks for a follow-up.   30  minutes were spent on this visit.  The time was dedicated to reviewing laboratory data, disease status update and outlining future plan of care discussion.     Zola Button, MD 10/25/20238:19 AM

## 2022-03-06 NOTE — Patient Instructions (Signed)
[facility]COLOG[facility]  Discharge Instructions: Thank you for choosing [facility] to provide your oncology and hematology care.   If you have a lab appointment with the Cancer Center, please go directly to the Cancer Center and check in at the registration area.   Wear comfortable clothing and clothing appropriate for easy access to any Portacath or PICC line.   We strive to give you quality time with your provider. You may need to reschedule your appointment if you arrive late (15 or more minutes).  Arriving late affects you and other patients whose appointments are after yours.  Also, if you miss three or more appointments without notifying the office, you may be dismissed from the clinic at the provider's discretion.      For prescription refill requests, have your pharmacy contact our office and allow 72 hours for refills to be completed.    Today you received the following chemotherapy and/or immunotherapy agents: Padcev      To help prevent nausea and vomiting after your treatment, we encourage you to take your nausea medication as directed.  BELOW ARE SYMPTOMS THAT SHOULD BE REPORTED IMMEDIATELY: *FEVER GREATER THAN 100.4 F (38 C) OR HIGHER *CHILLS OR SWEATING *NAUSEA AND VOMITING THAT IS NOT CONTROLLED WITH YOUR NAUSEA MEDICATION *UNUSUAL SHORTNESS OF BREATH *UNUSUAL BRUISING OR BLEEDING *URINARY PROBLEMS (pain or burning when urinating, or frequent urination) *BOWEL PROBLEMS (unusual diarrhea, constipation, pain near the anus) TENDERNESS IN MOUTH AND THROAT WITH OR WITHOUT PRESENCE OF ULCERS (sore throat, sores in mouth, or a toothache) UNUSUAL RASH, SWELLING OR PAIN  UNUSUAL VAGINAL DISCHARGE OR ITCHING   Items with * indicate a potential emergency and should be followed up as soon as possible or go to the Emergency Department if any problems should occur.  Please show the CHEMOTHERAPY ALERT CARD or IMMUNOTHERAPY ALERT CARD at check-in to the  Emergency Department and triage nurse.  Should you have questions after your visit or need to cancel or reschedule your appointment, please contact [facility]  Dept: [phone]  and follow the prompts.  Office hours are 8:00 a.m. to 4:30 p.m. Monday - Friday. Please note that voicemails left after 4:00 p.m. may not be returned until the following business day.  We are closed weekends and major holidays. You have access to a nurse at all times for urgent questions. Please call the main number to the clinic Dept: [phone] and follow the prompts.   For any non-urgent questions, you may also contact your provider using MyChart. We now offer e-Visits for anyone 18 and older to request care online for non-urgent symptoms. For details visit [email or web address].   Also download the MyChart app! Go to the app store, search "MyChart", open the app, select [facility], and log in with your MyChart username and password.  Masks are optional in the cancer centers. If you would like for your care team to wear a mask while they are taking care of you, please let them know. You may have one support person who is at least 72 years old accompany you for your appointments. 

## 2022-03-08 ENCOUNTER — Encounter: Payer: Self-pay | Admitting: Adult Health

## 2022-03-09 ENCOUNTER — Encounter: Payer: Self-pay | Admitting: Oncology

## 2022-03-12 ENCOUNTER — Other Ambulatory Visit: Payer: Self-pay | Admitting: *Deleted

## 2022-03-12 DIAGNOSIS — C679 Malignant neoplasm of bladder, unspecified: Secondary | ICD-10-CM

## 2022-03-12 NOTE — Progress Notes (Signed)
Attempted to reach Tiffany in IR to see if we could get the port removed on the same day that the patient is having his nephrostomy exhange done.  Left message pending call back.  Order placed.  Patient's spouse notified.

## 2022-03-14 ENCOUNTER — Telehealth: Payer: Self-pay | Admitting: Pharmacist

## 2022-03-14 DIAGNOSIS — J9611 Chronic respiratory failure with hypoxia: Secondary | ICD-10-CM | POA: Diagnosis not present

## 2022-03-14 NOTE — Telephone Encounter (Signed)
Received fax from [facility] that patient needs refills for [facility] sent to [facility] in [place], [place]. Routing to PCP's teamcare pool for the refill.

## 2022-03-15 MED ORDER — BREZTRI AEROSPHERE 160-9-4.8 MCG/ACT IN AERO: 2.0000 | INHALATION_SPRAY | Freq: Two times a day (BID) | RESPIRATORY_TRACT | 11 refills | Status: DC

## 2022-03-15 MED ORDER — BREZTRI AEROSPHERE 160-9-4.8 MCG/ACT IN AERO: 2.0000 | INHALATION_SPRAY | Freq: Two times a day (BID) | RESPIRATORY_TRACT | 11 refills | Status: AC

## 2022-03-15 NOTE — Addendum Note (Signed)
Addended by: Gwenyth Ober R on: 03/15/2022 10:39 AM   Modules accepted: Orders

## 2022-03-15 NOTE — Telephone Encounter (Signed)
Rx refilled electronically. No further actions needed.

## 2022-03-17 ENCOUNTER — Encounter: Payer: Self-pay | Admitting: Oncology

## 2022-03-19 ENCOUNTER — Telehealth: Payer: Self-pay | Admitting: Pharmacist

## 2022-03-19 NOTE — Chronic Care Management (AMB) (Signed)
Chronic Care Management Pharmacy Assistant   Name: Samuel [name]  MRN: [id number] DOB: 07-Apr-1951  Reason for Encounter: Disease State   Conditions to be addressed/monitored: COPD  Recent office visits:  None  Recent consult visits:  03/06/22 Wyatt Portela, MD (Oncology) - Patient presented for Malignant neoplasm of urinary bladder unspecified site & Infusion. No medication changes.  02/20/22 Morrell Riddle, RD (Dietician) - Patient seen for nutrition visit. No medication changes.  02/06/22 Wyatt Portela, MD (Oncology) - Patient presented for Malignant neoplasm of urinary bladder unspecified site & Infusion. No medication changes.  02/04/22 Freddi Starr, MD (Pulmonology) - Patient presented for Centrilobular emphysema and other concerns. No medication changes.  Hospital visits:  None in previous 6 months  Medications: Outpatient Encounter Medications as of 03/19/2022  Medication Sig Note   acetaminophen (TYLENOL) 325 MG tablet Take 325 mg by mouth every 6 (six) hours as needed for moderate pain or fever.    albuterol (PROVENTIL) (2.5 MG/3ML) 0.083% nebulizer solution Take 2.5 mg by nebulization in the morning and at bedtime.    albuterol (VENTOLIN HFA) 108 (90 Base) MCG/ACT inhaler Inhale 2 puffs into the lungs every 6 (six) hours as needed for wheezing or shortness of breath.    amLODipine (NORVASC) 10 MG tablet Take 10 mg by mouth at bedtime.    aspirin EC 81 MG tablet Take 1 tablet (81 mg total) by mouth daily. Swallow whole.    atorvastatin (LIPITOR) 40 MG tablet TAKE 1 TABLET(40 MG) BY MOUTH DAILY    Budeson-Glycopyrrol-Formoterol (BREZTRI AEROSPHERE) 160-9-4.8 MCG/ACT AERO Inhale 2 puffs into the lungs in the morning and at bedtime.    Cholecalciferol (VITAMIN D3) 25 MCG (1000 UT) CAPS 2 capsules daily.    ciprofloxacin (CIPRO) 500 MG tablet Take 1 tablet (500 mg total) by mouth 2 (two) times daily.    citalopram (CELEXA) 20 MG tablet TAKE 1  TABLET(20 MG) BY MOUTH AT BEDTIME    Cyanocobalamin (B-12 PO) Take 1 capsule by mouth daily.    finasteride (PROSCAR) 5 MG tablet Take 5 mg by mouth daily.    furosemide (LASIX) 20 MG tablet Take 20 mg by mouth 3 (three) times a week.    lidocaine-prilocaine (EMLA) cream Apply 1 application topically as needed. 07/06/2021: Hasn't started.   metoprolol succinate (TOPROL-XL) 25 MG 24 hr tablet Take 25 mg by mouth daily.    oxyCODONE (OXY IR/ROXICODONE) 5 MG immediate release tablet Take 1 tablet (5 mg total) by mouth every 6 (six) hours as needed for moderate pain or severe pain (Post-operatively).    OXYGEN Inhale 2 L into the lungs See admin instructions. 2L when taking a nap and at bedtime    prochlorperazine (COMPAZINE) 10 MG tablet TAKE 1 TABLET(10 MG) BY MOUTH EVERY 6 HOURS AS NEEDED FOR NAUSEA OR VOMITING    tamsulosin (FLOMAX) 0.4 MG CAPS capsule Take 2 capsules by mouth at bedtime.    No facility-administered encounter medications on file as of 03/19/2022.   Current COPD regimen:  albuterol (VENTOLIN HFA) 108 (90 Base) MCG/ACT inhaler  Budeson-Glycopyrrol-Formoterol (BREZTRI AEROSPHERE) 160-9-4.8 MCG/ACT AERO  albuterol (PROVENTIL) (2.5 MG/3ML) 0.083% nebulizer solution      No data to display        Spoke to patient's wife for call: Any recent hospitalizations or ED visits since last visit with CPP? Yes, wife reports he fell last weekend and again on yesterday evening and both times his tubes were pulled out, she reports  hospice nurse is there with them on this morning and they are awaiting instruction to return to the hospital on some time today. Wife states pt denies COPD symptoms, including  Have you had exacerbation/flare-up since last visit? No What do you do when you are short of breath?  Adhere to COPD Action Plan and Anxiety medication  Respiratory Devices/Equipment Do you have a nebulizer? No Do you use a Peak Flow Meter? No Do you use a maintenance inhaler? Yes How  often do you forget to use your daily inhaler? Wife reports not at all Do you use a rescue inhaler? Yes   Adherence Review: Does the patient have >5 day gap between last estimated fill date for maintenance inhaler medications? No, wife reports he is compliant in the use of the above maintenance medications.    Care Gaps: COVID Booster - Overdue Zoster Vaccine - Overdue Flu Vaccine - Overdue Colonoscopy - Overdue AWV- 08/24/21  Star Rating Drugs: Atorvastatin (Lipitor) 40 mg - Last filled 01/05/22 90 DS at [facility] Pharmacist Assistant [phone]

## 2022-03-20 ENCOUNTER — Encounter: Payer: Medicare Other | Admitting: Dietician

## 2022-03-20 ENCOUNTER — Ambulatory Visit: Payer: Medicare Other

## 2022-03-20 ENCOUNTER — Other Ambulatory Visit: Payer: Medicare Other

## 2022-03-25 ENCOUNTER — Encounter: Payer: Self-pay | Admitting: Oncology

## 2022-03-25 ENCOUNTER — Other Ambulatory Visit: Payer: Self-pay

## 2022-03-25 DIAGNOSIS — N1832 Chronic kidney disease, stage 3b: Secondary | ICD-10-CM | POA: Diagnosis not present

## 2022-03-25 DIAGNOSIS — C679 Malignant neoplasm of bladder, unspecified: Secondary | ICD-10-CM

## 2022-03-26 ENCOUNTER — Other Ambulatory Visit: Payer: Self-pay | Admitting: Oncology

## 2022-03-26 ENCOUNTER — Encounter: Payer: Self-pay | Admitting: Oncology

## 2022-03-26 ENCOUNTER — Encounter: Payer: Self-pay | Admitting: Pulmonary Disease

## 2022-03-26 MED ORDER — OXYCODONE HCL 5 MG PO TABS: 5.0000 mg | ORAL_TABLET | Freq: Four times a day (QID) | ORAL | 0 refills | Status: DC | PRN

## 2022-03-26 NOTE — Telephone Encounter (Signed)
Will forward to Dr. Erin Fulling as Juluis Rainier.

## 2022-03-30 DIAGNOSIS — N99522 Malfunction of other external stoma of urinary tract: Secondary | ICD-10-CM | POA: Diagnosis not present

## 2022-03-30 DIAGNOSIS — F32A Depression, unspecified: Secondary | ICD-10-CM | POA: Diagnosis not present

## 2022-03-30 DIAGNOSIS — Z9981 Dependence on supplemental oxygen: Secondary | ICD-10-CM | POA: Diagnosis not present

## 2022-03-30 DIAGNOSIS — Z79899 Other long term (current) drug therapy: Secondary | ICD-10-CM | POA: Diagnosis not present

## 2022-03-30 DIAGNOSIS — J9611 Chronic respiratory failure with hypoxia: Secondary | ICD-10-CM | POA: Diagnosis not present

## 2022-03-30 DIAGNOSIS — J449 Chronic obstructive pulmonary disease, unspecified: Secondary | ICD-10-CM | POA: Diagnosis not present

## 2022-03-30 DIAGNOSIS — T83022A Displacement of nephrostomy catheter, initial encounter: Secondary | ICD-10-CM | POA: Diagnosis not present

## 2022-03-30 DIAGNOSIS — J9610 Chronic respiratory failure, unspecified whether with hypoxia or hypercapnia: Secondary | ICD-10-CM | POA: Diagnosis not present

## 2022-03-30 DIAGNOSIS — Z87891 Personal history of nicotine dependence: Secondary | ICD-10-CM | POA: Diagnosis not present

## 2022-03-30 DIAGNOSIS — N1330 Unspecified hydronephrosis: Secondary | ICD-10-CM | POA: Diagnosis not present

## 2022-03-30 DIAGNOSIS — S60221A Contusion of right hand, initial encounter: Secondary | ICD-10-CM | POA: Diagnosis not present

## 2022-03-30 DIAGNOSIS — I10 Essential (primary) hypertension: Secondary | ICD-10-CM | POA: Diagnosis not present

## 2022-03-30 DIAGNOSIS — W1830XA Fall on same level, unspecified, initial encounter: Secondary | ICD-10-CM | POA: Diagnosis not present

## 2022-03-30 DIAGNOSIS — S5011XA Contusion of right forearm, initial encounter: Secondary | ICD-10-CM | POA: Diagnosis not present

## 2022-03-30 DIAGNOSIS — C679 Malignant neoplasm of bladder, unspecified: Secondary | ICD-10-CM | POA: Diagnosis not present

## 2022-03-30 DIAGNOSIS — Z7982 Long term (current) use of aspirin: Secondary | ICD-10-CM | POA: Diagnosis not present

## 2022-03-30 DIAGNOSIS — Z515 Encounter for palliative care: Secondary | ICD-10-CM | POA: Diagnosis not present

## 2022-03-31 DIAGNOSIS — Z9981 Dependence on supplemental oxygen: Secondary | ICD-10-CM | POA: Diagnosis not present

## 2022-03-31 DIAGNOSIS — W1830XA Fall on same level, unspecified, initial encounter: Secondary | ICD-10-CM | POA: Diagnosis not present

## 2022-03-31 DIAGNOSIS — Z466 Encounter for fitting and adjustment of urinary device: Secondary | ICD-10-CM | POA: Diagnosis not present

## 2022-03-31 DIAGNOSIS — I10 Essential (primary) hypertension: Secondary | ICD-10-CM | POA: Diagnosis not present

## 2022-03-31 DIAGNOSIS — J449 Chronic obstructive pulmonary disease, unspecified: Secondary | ICD-10-CM | POA: Diagnosis not present

## 2022-03-31 DIAGNOSIS — J9611 Chronic respiratory failure with hypoxia: Secondary | ICD-10-CM | POA: Diagnosis not present

## 2022-03-31 DIAGNOSIS — N99528 Other complication of other external stoma of urinary tract: Secondary | ICD-10-CM | POA: Diagnosis not present

## 2022-03-31 DIAGNOSIS — Z515 Encounter for palliative care: Secondary | ICD-10-CM | POA: Diagnosis not present

## 2022-03-31 DIAGNOSIS — Z79899 Other long term (current) drug therapy: Secondary | ICD-10-CM | POA: Diagnosis not present

## 2022-03-31 DIAGNOSIS — C679 Malignant neoplasm of bladder, unspecified: Secondary | ICD-10-CM | POA: Diagnosis not present

## 2022-04-01 DIAGNOSIS — Y999 Unspecified external cause status: Secondary | ICD-10-CM | POA: Diagnosis not present

## 2022-04-01 DIAGNOSIS — X58XXXA Exposure to other specified factors, initial encounter: Secondary | ICD-10-CM | POA: Diagnosis not present

## 2022-04-01 DIAGNOSIS — T83022A Displacement of nephrostomy catheter, initial encounter: Secondary | ICD-10-CM | POA: Diagnosis not present

## 2022-04-02 ENCOUNTER — Ambulatory Visit (HOSPITAL_COMMUNITY): Payer: Medicare Other

## 2022-04-02 ENCOUNTER — Other Ambulatory Visit (HOSPITAL_COMMUNITY): Payer: Medicare Other

## 2022-04-02 DIAGNOSIS — T83022A Displacement of nephrostomy catheter, initial encounter: Secondary | ICD-10-CM | POA: Diagnosis not present

## 2022-04-06 ENCOUNTER — Emergency Department (HOSPITAL_COMMUNITY)

## 2022-04-06 ENCOUNTER — Other Ambulatory Visit: Payer: Self-pay

## 2022-04-06 ENCOUNTER — Emergency Department (HOSPITAL_COMMUNITY)
Admission: EM | Admit: 2022-04-06 | Discharge: 2022-04-06 | Disposition: A | Discharge status: Home / Self Care | Attending: Emergency Medicine | Admitting: Emergency Medicine

## 2022-04-06 ENCOUNTER — Encounter (HOSPITAL_COMMUNITY): Payer: Self-pay

## 2022-04-06 DIAGNOSIS — Z7982 Long term (current) use of aspirin: Secondary | ICD-10-CM | POA: Diagnosis not present

## 2022-04-06 DIAGNOSIS — R791 Abnormal coagulation profile: Secondary | ICD-10-CM | POA: Diagnosis not present

## 2022-04-06 DIAGNOSIS — Z79899 Other long term (current) drug therapy: Secondary | ICD-10-CM | POA: Diagnosis not present

## 2022-04-06 DIAGNOSIS — D72829 Elevated white blood cell count, unspecified: Secondary | ICD-10-CM | POA: Diagnosis not present

## 2022-04-06 DIAGNOSIS — K5730 Diverticulosis of large intestine without perforation or abscess without bleeding: Secondary | ICD-10-CM | POA: Diagnosis not present

## 2022-04-06 DIAGNOSIS — N1330 Unspecified hydronephrosis: Secondary | ICD-10-CM | POA: Diagnosis not present

## 2022-04-06 DIAGNOSIS — Y69 Unspecified misadventure during surgical and medical care: Secondary | ICD-10-CM | POA: Insufficient documentation

## 2022-04-06 DIAGNOSIS — T83022A Displacement of nephrostomy catheter, initial encounter: Secondary | ICD-10-CM

## 2022-04-06 DIAGNOSIS — Z8551 Personal history of malignant neoplasm of bladder: Secondary | ICD-10-CM | POA: Diagnosis not present

## 2022-04-06 DIAGNOSIS — N1832 Chronic kidney disease, stage 3b: Secondary | ICD-10-CM | POA: Diagnosis not present

## 2022-04-06 DIAGNOSIS — Z87891 Personal history of nicotine dependence: Secondary | ICD-10-CM | POA: Insufficient documentation

## 2022-04-06 DIAGNOSIS — J449 Chronic obstructive pulmonary disease, unspecified: Secondary | ICD-10-CM | POA: Diagnosis not present

## 2022-04-06 DIAGNOSIS — K449 Diaphragmatic hernia without obstruction or gangrene: Secondary | ICD-10-CM | POA: Diagnosis not present

## 2022-04-06 DIAGNOSIS — C679 Malignant neoplasm of bladder, unspecified: Secondary | ICD-10-CM

## 2022-04-06 DIAGNOSIS — C7951 Secondary malignant neoplasm of bone: Secondary | ICD-10-CM | POA: Diagnosis not present

## 2022-04-06 DIAGNOSIS — I129 Hypertensive chronic kidney disease with stage 1 through stage 4 chronic kidney disease, or unspecified chronic kidney disease: Secondary | ICD-10-CM | POA: Diagnosis not present

## 2022-04-06 DIAGNOSIS — C672 Malignant neoplasm of lateral wall of bladder: Secondary | ICD-10-CM

## 2022-04-06 HISTORY — PX: IR NEPHROSTOMY EXCHANGE RIGHT: IMG6070

## 2022-04-06 HISTORY — PX: IR NEPHROSTOMY EXCHANGE LEFT: IMG6069

## 2022-04-06 LAB — CBC WITH DIFFERENTIAL/PLATELET
Abs Immature Granulocytes: 0.31 10*3/uL — ABNORMAL HIGH (ref 0.00–0.07)
Basophils Absolute: 0 10*3/uL (ref 0.0–0.1)
Basophils Relative: 0 %
Eosinophils Absolute: 0.2 10*3/uL (ref 0.0–0.5)
Eosinophils Relative: 1 %
HCT: 28.7 % — ABNORMAL LOW (ref 39.0–52.0)
Hemoglobin: 8.6 g/dL — ABNORMAL LOW (ref 13.0–17.0)
Immature Granulocytes: 2 %
Lymphocytes Relative: 6 %
Lymphs Abs: 0.7 10*3/uL (ref 0.7–4.0)
MCH: 25.2 pg — ABNORMAL LOW (ref 26.0–34.0)
MCHC: 30 g/dL (ref 30.0–36.0)
MCV: 84.2 fL (ref 80.0–100.0)
Monocytes Absolute: 1 10*3/uL (ref 0.1–1.0)
Monocytes Relative: 8 %
Neutro Abs: 10.5 10*3/uL — ABNORMAL HIGH (ref 1.7–7.7)
Neutrophils Relative %: 83 %
Platelets: 580 10*3/uL — ABNORMAL HIGH (ref 150–400)
RBC: 3.41 MIL/uL — ABNORMAL LOW (ref 4.22–5.81)
RDW: 19 % — ABNORMAL HIGH (ref 11.5–15.5)
WBC: 12.7 10*3/uL — ABNORMAL HIGH (ref 4.0–10.5)
nRBC: 0 % (ref 0.0–0.2)

## 2022-04-06 LAB — BASIC METABOLIC PANEL
Anion gap: 11 (ref 5–15)
BUN: 40 mg/dL — ABNORMAL HIGH (ref 8–23)
CO2: 25 mmol/L (ref 22–32)
Calcium: 8.3 mg/dL — ABNORMAL LOW (ref 8.9–10.3)
Chloride: 100 mmol/L (ref 98–111)
Creatinine, Ser: 2.68 mg/dL — ABNORMAL HIGH (ref 0.61–1.24)
GFR, Estimated: 24 mL/min — ABNORMAL LOW (ref >60)
Glucose, Bld: 105 mg/dL — ABNORMAL HIGH (ref 70–99)
Potassium: 3.5 mmol/L (ref 3.5–5.1)
Sodium: 136 mmol/L (ref 135–145)

## 2022-04-06 LAB — PROTIME-INR
INR: 1.1 (ref 0.8–1.2)
Prothrombin Time: 14.4 seconds (ref 11.4–15.2)

## 2022-04-06 MED ORDER — SODIUM CHLORIDE 0.9 % IV BOLUS
500.0000 mL | Freq: Once | INTRAVENOUS | Status: AC
  Administered 2022-04-06: 500 mL via INTRAVENOUS

## 2022-04-06 MED ORDER — LIDOCAINE HCL 1 % IJ SOLN: 10.0000 mL | Freq: Once | INTRAMUSCULAR | Status: AC

## 2022-04-06 MED ORDER — IOHEXOL 300 MG/ML  SOLN
50.0000 mL | Freq: Once | INTRAMUSCULAR | Status: AC | PRN
  Administered 2022-04-06: 10 mL

## 2022-04-06 MED ORDER — LIDOCAINE HCL 1 % IJ SOLN
INTRAMUSCULAR | Status: AC
  Administered 2022-04-06: 5 mL
  Filled 2022-04-06: qty 20

## 2022-04-06 NOTE — Discharge Instructions (Addendum)
We evaluated you for your nephrostomy tube displacement.  Your tube was replaced by the interventional radiologist.  Please return to the hospital if you develop any complications such as tube displacement or pain at tube site.  Please also return if you develop any fevers, chills, foul-smelling urine, or any other concerning symptoms.  Your lab test showed a mild kidney injury likely due to dehydration and tube displacement.  Please follow-up with your primary doctor for recheck of your lab tests in 1 week to ensure that it is improving.

## 2022-04-06 NOTE — Procedures (Signed)
Interventional Radiology Procedure:   Indications: Bladder cancer with chronic nephrostomy tubes and dislodged right nephrostomy  Procedure: Exchange of bilateral nephrostomy tubes  Findings: New 10 Fr drains placed bilaterally  Complications: No immediate complications noted.     EBL: Minimal  Plan: Routine exchange in 12 weeks.    Felisa Zechman R. Anselm Pancoast, MD  Pager: [phone]

## 2022-04-06 NOTE — ED Provider Notes (Signed)
[place] DEPT Provider Note  CSN: [id number] Arrival date & time: 04/06/22 1154  Chief Complaint(s) No chief complaint on file.  HPI Samuel [name] is a 71 y.o. male with history of bladder cancer with bilateral ureteral outlet obstruction with bilateral nephrostomy tubes presenting to the emergency department with nephrostomy tube issue.  Patient had recent nephrostomy tube issue and [facility] and [facility].  Patient's wife reports that the right tube came out sometime today.  He reports some pain in the right flank since tube dislodgement. The left tube has been draining but she is concerned that it might be out of place.  No nausea, vomiting.  Has weakness from his bladder cancer which is end-stage. No fevers, chills.    Past Medical History Past Medical History:  Diagnosis Date   Anemia    Anxiety    Arthritis    Bladder cancer [facility])    urologist--- dr Tresa Moore   BPH with obstruction/lower urinary tract symptoms    CKD (chronic kidney disease), stage III ([place])    Congenital deformity of left hand    COPD (chronic obstructive pulmonary disease) ([place])    followed by pcp   Depression    Dyspnea    Essential hypertension    followed by pcp   History of adenomatous polyp of colon    Hyperlipidemia    Renal cysts, acquired, bilateral    Wears glasses    Patient Active Problem List   Diagnosis Date Noted   Port-A-Cath in place 07/11/2021   Acute on chronic anemia 07/07/2021   Leukopenia 07/07/2021   UTI (urinary tract infection) 07/06/2021   Acute lower UTI 03/31/2021   Lower obstructive uropathy 03/30/2021   Acute renal failure superimposed on stage 3b chronic kidney disease ([place]) 03/30/2021   Bladder cancer ([place]) 03/30/2021   Lightheadedness 03/29/2021   Centrilobular emphysema (HCC)    COPD (chronic obstructive pulmonary disease) (HCC)    Nocturnal hypoxemia due to emphysema (HCC)    Shortness of breath on  exertion    Hyperlipidemia 12/19/2017   Depression    Essential hypertension 11/15/2016   Tobacco use 11/15/2016   BPH without urinary obstruction 11/15/2016   Home Medication(s) Prior to Admission medications   Medication Sig Start Date End Date Taking? Authorizing Provider  acetaminophen (TYLENOL) 325 MG tablet Take 325 mg by mouth every 6 (six) hours as needed for moderate pain or fever.    [provider]  albuterol (PROVENTIL) (2.5 MG/3ML) 0.083% nebulizer solution Take 2.5 mg by nebulization in the morning and at bedtime.    [provider]  albuterol (VENTOLIN HFA) 108 (90 Base) MCG/ACT inhaler Inhale 2 puffs into the lungs every 6 (six) hours as needed for wheezing or shortness of breath. 11/08/20   Freddi Starr, MD  amLODipine (NORVASC) 10 MG tablet Take 10 mg by mouth at bedtime. 06/05/20   [provider]  aspirin EC 81 MG tablet Take 1 tablet (81 mg total) by mouth daily. Swallow whole. 04/17/21   Early Osmond, MD  atorvastatin (LIPITOR) 40 MG tablet TAKE 1 TABLET(40 MG) BY MOUTH DAILY 01/09/22   [name], [name], NP  Budeson-Glycopyrrol-Formoterol (BREZTRI AEROSPHERE) 160-9-4.8 MCG/ACT AERO Inhale 2 puffs into the lungs in the morning and at bedtime. 03/15/22   [name], [name], NP  Cholecalciferol (VITAMIN D3) 25 MCG (1000 UT) CAPS 2 capsules daily. 07/23/21   [provider]  ciprofloxacin (CIPRO) 500 MG tablet Take 1 tablet (500  mg total) by mouth 2 (two) times daily. 01/23/22   Wyatt Portela, MD  citalopram (CELEXA) 20 MG tablet TAKE 1 TABLET(20 MG) BY MOUTH AT BEDTIME 12/24/21   [name], [name], NP  Cyanocobalamin (B-12 PO) Take 1 capsule by mouth daily.    [provider]  finasteride (PROSCAR) 5 MG tablet Take 5 mg by mouth daily. 05/15/20   Alexis Frock, MD  furosemide (LASIX) 20 MG tablet Take 20 mg by mouth 3 (three) times a week. 07/23/21   [provider]  lidocaine-prilocaine (EMLA) cream Apply 1 application  topically as needed. 06/07/21   Wyatt Portela, MD  metoprolol succinate (TOPROL-XL) 25 MG 24 hr tablet Take 25 mg by mouth daily.    [provider]  oxyCODONE (OXY IR/ROXICODONE) 5 MG immediate release tablet Take 1 tablet (5 mg total) by mouth every 6 (six) hours as needed for moderate pain or severe pain (Post-operatively). 03/26/22   Wyatt Portela, MD  OXYGEN Inhale 2 L into the lungs See admin instructions. 2L when taking a nap and at bedtime    [provider]  prochlorperazine (COMPAZINE) 10 MG tablet TAKE 1 TABLET(10 MG) BY MOUTH EVERY 6 HOURS AS NEEDED FOR NAUSEA OR VOMITING 06/11/21   Wyatt Portela, MD  tamsulosin (FLOMAX) 0.4 MG CAPS capsule Take 2 capsules by mouth at bedtime. 04/21/20   [provider]                                                                                                                                    Past Surgical History Past Surgical History:  Procedure Laterality Date   COLONOSCOPY WITH PROPOFOL  last one 02-04-2017  dr Henrene Pastor   CYSTOSCOPY W/ RETROGRADES Bilateral 09/15/2020   Procedure: CYSTOSCOPY WITH RETROGRADE PYELOGRAM;  Surgeon: Alexis Frock, MD;  Location[facility];  Service: Urology;  Laterality: Bilateral;   CYSTOSCOPY W/ RETROGRADES Bilateral 05/09/2021   Procedure: CYSTOSCOPY;  Surgeon: Alexis Frock, MD;  Location: [facility];  Service: Urology;  Laterality: Bilateral;   IR IMAGING GUIDED PORT INSERTION  06/18/2021   IR NEPHROSTOMY EXCHANGE LEFT  06/18/2021   IR NEPHROSTOMY EXCHANGE LEFT  07/09/2021   IR NEPHROSTOMY EXCHANGE LEFT  08/13/2021   IR NEPHROSTOMY EXCHANGE LEFT  10/09/2021   IR NEPHROSTOMY EXCHANGE LEFT  12/04/2021   IR NEPHROSTOMY EXCHANGE LEFT  02/05/2022   IR NEPHROSTOMY EXCHANGE RIGHT  06/18/2021   IR NEPHROSTOMY EXCHANGE RIGHT  07/09/2021   IR NEPHROSTOMY EXCHANGE RIGHT  08/13/2021   IR NEPHROSTOMY EXCHANGE RIGHT  10/09/2021   IR NEPHROSTOMY EXCHANGE RIGHT  12/04/2021   IR NEPHROSTOMY  EXCHANGE RIGHT  02/05/2022   IR NEPHROSTOMY PLACEMENT LEFT  03/30/2021   IR NEPHROSTOMY PLACEMENT RIGHT  04/02/2021   TRANSURETHRAL RESECTION OF BLADDER TUMOR N/A 09/15/2020   Procedure: BLADDER BIOPSY FULGERATION;  Surgeon: Alexis Frock, MD;  Location: [facility];  Service:  Urology;  Laterality: N/A;  [place] TUMOR N/A 05/09/2021   Procedure: TRANSURETHRAL RESECTION OF BLADDER TUMOR (TURBT);  Surgeon: Alexis Frock, MD;  Location: [facility];  Service: Urology;  Laterality: N/A;  70 MINS   Family History Family History  Problem Relation Age of Onset   Hypertension Mother    Stroke Mother    Heart disease Father    Breast cancer Sister    Hypertension Sister    Prostate cancer Brother    Colon cancer Neg Hx    Esophageal cancer Neg Hx    Pancreatic cancer Neg Hx    Rectal cancer Neg Hx    Stomach cancer Neg Hx     Social History Social History   Tobacco Use   Smoking status: Former    Years: 58.00    Types: Cigarettes    Quit date: 01/16/2017    Years since quitting: 5.2   Smokeless tobacco: Never  Vaping Use   Vaping Use: Never used  Substance Use Topics   Alcohol use: No   Drug use: Never   Allergies Penicillins  Review of Systems Review of Systems  All other systems reviewed and are negative.   Physical Exam Vital Signs  I have reviewed the triage vital signs BP 128/66   Pulse 99   Temp 98 F (36.7 C) (Oral)   Resp 16   Ht '5\' 3"'$  (1.6 m)   Wt 60.3 kg   SpO2 99%   BMI 23.56 kg/m  Physical Exam Vitals and nursing note reviewed.  Constitutional:      Comments: Cachectic appearing and chronically ill appearing  HENT:     Mouth/Throat:     Mouth: Mucous membranes are moist.  Eyes:     Conjunctiva/sclera: Conjunctivae normal.  Cardiovascular:     Rate and Rhythm: Normal rate and regular rhythm.  Pulmonary:     Effort: Pulmonary effort is normal. No respiratory distress.     Breath sounds: Normal  breath sounds.  Abdominal:     General: Abdomen is flat.     Palpations: Abdomen is soft.     Tenderness: There is no abdominal tenderness. There is no right CVA tenderness or left CVA tenderness.     Comments: Right nephrostomy tube missing.  Site appears normal.  Left nephrostomy tube in place, draining yellow urine.  Site clean, dry, intact  Musculoskeletal:     Right lower leg: No edema.     Left lower leg: No edema.  Skin:    General: Skin is warm and dry.     Capillary Refill: Capillary refill takes less than 2 seconds.  Neurological:     Mental Status: He is alert and oriented to person, place, and time. Mental status is at baseline.  Psychiatric:        Mood and Affect: Mood normal.        Behavior: Behavior normal.     ED Results and Treatments Labs (all labs ordered are listed, but only abnormal results are displayed) Labs Reviewed  BASIC METABOLIC PANEL - Abnormal; Notable for the following components:      Result Value   Glucose, Bld 105 (*)    BUN 40 (*)    Creatinine, Ser 2.68 (*)    Calcium 8.3 (*)    GFR, Estimated 24 (*)    All other components within normal limits  CBC WITH DIFFERENTIAL/PLATELET - Abnormal; Notable for the following components:   WBC 12.7 (*)  RBC 3.41 (*)    Hemoglobin 8.6 (*)    HCT 28.7 (*)    MCH 25.2 (*)    RDW 19.0 (*)    Platelets 580 (*)    Neutro Abs 10.5 (*)    Abs Immature Granulocytes 0.31 (*)    All other components within normal limits  PROTIME-INR                                                                                                                          Radiology CT Renal Stone Study  Result Date: 04/06/2022 CLINICAL DATA:  Displacing the right nephrostomy tube. EXAM: CT ABDOMEN AND PELVIS WITHOUT CONTRAST TECHNIQUE: Multidetector CT imaging of the abdomen and pelvis was performed following the standard protocol without IV contrast. RADIATION DOSE REDUCTION: This exam was performed according to the  departmental dose-optimization program which includes automated exposure control, adjustment of the mA and/or kV according to patient size and/or use of iterative reconstruction technique. COMPARISON:  PET-CT 01/28/2022. FINDINGS: Lower chest: Chronic circumferential bronchial wall thickening noted bilaterally. Centrilobular and paraseptal emphysema evident. Interval development of bilateral lower lobe dependent collapse/consolidation with tiny bilateral pleural effusions. Hepatobiliary: No suspicious focal abnormality in the liver on this study without intravenous contrast. High attenuation material layering in the gallbladder lumen is probably sludge. No intrahepatic or extrahepatic biliary dilation. Pancreas: No focal mass lesion. No dilatation of the main duct. No intraparenchymal cyst. No peripancreatic edema. Spleen: No splenomegaly. No focal mass lesion. Adrenals/Urinary Tract: SIRT normal adrenals there is moderate right hydroureteronephrosis with ureteral dilatation down to the level of the bladder. Stable small exophytic lower pole cyst. Left percutaneous nephrostomy tube remains in place with the loop formed in the low pelvis towards the UPJ. No left hydroureteronephrosis. Bladder is nondistended and poorly evaluated on noncontrast imaging but there appears to be posterior bladder wall thickening this patient with a history of bladder cancer. In Stomach/Bowel: Tiny hiatal hernia. Stomach is unremarkable. No gastric wall thickening. No evidence of outlet obstruction. Duodenum is normally positioned as is the ligament of Treitz. No small bowel wall thickening. No small bowel dilatation. The terminal ileum is normal. The appendix is normal. No gross colonic mass. No colonic wall thickening. Diverticular changes are noted in the left colon without evidence of diverticulitis. Large stool volume throughout the colon with formed stool throughout the left colon and prominent volume stool in the rectum.  Vascular/Lymphatic: There is moderate atherosclerotic calcification of the abdominal aorta without aneurysm. Upper normal left para-aortic and bilateral iliac lymph nodes are similar to prior PET-CT. Reproductive: The prostate gland and seminal vesicles are unremarkable. Other: No intraperitoneal free fluid. Musculoskeletal: Widespread sclerotic bone metastases again noted without substantial change. IMPRESSION: 1. Moderate right hydroureteronephrosis with ureteral dilatation down to the level of the bladder. Bladder is nondistended and poorly evaluated on noncontrast imaging but there appears to be posterior bladder wall thickening this patient with a history of bladder cancer. 2. Left percutaneous nephrostomy tube remains  in place with the loop formed in the low pelvis towards the UPJ. No left hydroureteronephrosis. 3. Interval development of bilateral lower lobe dependent collapse/consolidation with tiny bilateral pleural effusions. 4. Widespread sclerotic bone metastases without substantial change. 5. Upper normal left para-aortic and bilateral iliac lymph nodes are similar to prior PET-CT and were noted to be hypermetabolic on that study consistent with metastatic disease. 6. Large stool volume throughout the colon with formed stool throughout the left colon and prominent volume stool in the rectum. Imaging features can be seen in the setting of clinical constipation. 7. Aortic Atherosclerosis (ICD10-I70.0) and Emphysema (ICD10-J43.9). Electronically Signed   By: Misty Stanley M.D.   On: 04/06/2022 14:36    Pertinent labs & imaging results that were available during my care of the patient were reviewed by me and considered in my medical decision making (see MDM for details).  Medications Ordered in ED Medications  sodium chloride 0.9 % bolus 500 mL (0 mLs Intravenous Stopped 04/06/22 1843)  lidocaine (XYLOCAINE) 1 % (with pres) injection 10 mL (5 mLs Infiltration Given 04/06/22 1702)  iohexol  (OMNIPAQUE) 300 MG/ML solution 50 mL (10 mLs Per Tube Contrast Given 04/06/22 1703)                                                                                                                                     Procedures Procedures  (including critical care time)  Medical Decision Making / ED Course   MDM:  71 year old male presenting to the emergency department with nephrostomy tube issue.  Physical exam with missing right nephrostomy tube.  CT abdomen performed.  Left nephrostomy tube appears to be in appropriate place.  Right nephrostomy tube missing with right hydronephrosis.  No CVA tenderness, fevers, doubt urinary infection.  Discussed with IR who will replace tube today.  Patient may also be mildly dehydrated, although left nephrostomy tube is in place has had decreased output.  Will give small fluid bolus.  Clinical Course as of 04/06/22 1947  Sat Apr 06, 2022  1528 Discussed with IR provider who will replace tube today. [WS]  1800 Tube was replaced.  Patient reports that mild pain around R flank that he had previously resolved. Suspect was due likely to hydronephrosis.  Both tubes draining yellow urine. No CVAT. Doubt infection. Will discharge patient to home. All questions answered. Patient comfortable with plan of discharge. Return precautions discussed with patient and specified on the after visit summary.  [WS]    Clinical Course User Index [WS] Truett Mainland, Livingston Diones, MD     Additional history obtained: -Additional history obtained from spouse -External records from outside source obtained and reviewed including: Chart review including previous notes, labs, imaging, consultation notes including notes from earlier this week at [facility] regarding tube replacement   Lab Tests: -I ordered, reviewed, and interpreted labs.   The pertinent results include:   Labs Reviewed  BASIC METABOLIC  PANEL - Abnormal; Notable for the following components:      Result Value    Glucose, Bld 105 (*)    BUN 40 (*)    Creatinine, Ser 2.68 (*)    Calcium 8.3 (*)    GFR, Estimated 24 (*)    All other components within normal limits  CBC WITH DIFFERENTIAL/PLATELET - Abnormal; Notable for the following components:   WBC 12.7 (*)    RBC 3.41 (*)    Hemoglobin 8.6 (*)    HCT 28.7 (*)    MCH 25.2 (*)    RDW 19.0 (*)    Platelets 580 (*)    Neutro Abs 10.5 (*)    Abs Immature Granulocytes 0.31 (*)    All other components within normal limits  PROTIME-INR    Notable for AKI on CKD, mild leukocytosis   Imaging Studies ordered: I ordered imaging studies including CT Abd On my interpretation imaging demonstrates R hydronephrosis I independently visualized and interpreted imaging. I agree with the radiologist interpretation   Medicines ordered and prescription drug management: Meds ordered this encounter  Medications   sodium chloride 0.9 % bolus 500 mL   lidocaine (XYLOCAINE) 1 % (with pres) injection    Hanner, James C: cabinet override   lidocaine (XYLOCAINE) 1 % (with pres) injection 10 mL   iohexol (OMNIPAQUE) 300 MG/ML solution 50 mL    -I have reviewed the patients home medicines and have made adjustments as needed   Consultations Obtained: I requested consultation with the interventional radiologist,  and discussed lab and imaging findings as well as pertinent plan - they recommend: tube replacement   Cardiac Monitoring: The patient was maintained on a cardiac monitor.  I personally viewed and interpreted the cardiac monitored which showed an underlying rhythm of: NSR  Social Determinants of Health:  Diagnosis or treatment significantly limited by social determinants of health: former smoker   Reevaluation: After the interventions noted above, I reevaluated the patient and found that they have improved  Co morbidities that complicate the patient evaluation  Past Medical History:  Diagnosis Date   Anemia    Anxiety    Arthritis     Bladder cancer [facility])    urologist--- dr Tresa Moore   BPH with obstruction/lower urinary tract symptoms    CKD (chronic kidney disease), stage III ([place])    Congenital deformity of left hand    COPD (chronic obstructive pulmonary disease) ([place])    followed by pcp   Depression    Dyspnea    Essential hypertension    followed by pcp   History of adenomatous polyp of colon    Hyperlipidemia    Renal cysts, acquired, bilateral    Wears glasses       Dispostion: Disposition decision including need for hospitalization was considered, and patient discharged from emergency department.    Final Clinical Impression(s) / ED Diagnoses Final diagnoses:  Displacement of nephrostomy tube [facility])     This chart was dictated using voice recognition software.  Despite best efforts to proofread,  errors can occur which can change the documentation meaning.    Cristie Hem, MD 04/06/22 [id number]

## 2022-04-06 NOTE — ED Triage Notes (Signed)
Patient's wife reports that the patient fell out of the chair and pulled bilateral nephrostomy tubes out a week ago. Patient went to [facility] and had surgery to have them replaced.  Patient's wife reports that the left nephrostomy tube fell out and was replaced at [facility].  Last night the right nephrostomy "fell out." Patient has a dressing over the area and is draining serosanguinous drainage.  Patient is currently in [facility]. Patient's wife states she took the patient out of hsopice and will need to be reinstated once the Nephrostomy tube is fixed.

## 2022-04-08 ENCOUNTER — Telehealth: Payer: Self-pay

## 2022-04-08 ENCOUNTER — Telehealth: Payer: Self-pay | Admitting: Oncology

## 2022-04-08 ENCOUNTER — Other Ambulatory Visit: Payer: Self-pay

## 2022-04-08 DIAGNOSIS — C679 Malignant neoplasm of bladder, unspecified: Secondary | ICD-10-CM

## 2022-04-08 NOTE — Telephone Encounter (Signed)
Called and spoke with patient's spouse regarding a voicemail that was left. The patient's spouse would like a refill on the oxycodone since her husband is in a lot of pain from having his nephrostomy tubes replaced. He is currently displaced from Hospice. I informed the spouse that I would redirect this to Dr. Alen Blew to see if he's able to refill the medication. The spouse verbalized understanding and had no further questions or concerns.

## 2022-04-09 ENCOUNTER — Other Ambulatory Visit: Payer: Self-pay | Admitting: Oncology

## 2022-04-09 MED ORDER — OXYCODONE HCL 5 MG PO TABS: 5.0000 mg | ORAL_TABLET | Freq: Four times a day (QID) | ORAL | 0 refills | Status: AC | PRN

## 2022-04-10 ENCOUNTER — Ambulatory Visit: Payer: Medicare Other | Admitting: Oncology

## 2022-04-10 ENCOUNTER — Other Ambulatory Visit: Payer: Medicare Other

## 2022-04-10 ENCOUNTER — Telehealth: Payer: Self-pay | Admitting: Adult Health

## 2022-04-10 NOTE — Telephone Encounter (Signed)
Noted  

## 2022-04-10 NOTE — Progress Notes (Signed)
Wife placed call to me stating that she no longer has any questions and does not wish for call back from Provider, "the situation has changed" she did not wish to provide any further information  [place] Pharmacist Assistant [phone]

## 2022-04-10 NOTE — Telephone Encounter (Signed)
Pt's spouse called, requesting information regarding hospice care.  Spouse is asking for a call back.

## 2022-04-16 ENCOUNTER — Encounter: Payer: Self-pay | Admitting: Adult Health

## 2022-04-16 NOTE — Telephone Encounter (Signed)
FYI

## 2022-04-22 ENCOUNTER — Encounter: Payer: Self-pay | Admitting: Oncology

## 2022-04-22 ENCOUNTER — Encounter: Payer: Self-pay | Admitting: Adult Health

## 2022-04-22 ENCOUNTER — Encounter: Payer: Self-pay | Admitting: Pulmonary Disease

## 2022-04-22 NOTE — Telephone Encounter (Signed)
Dr. Erin Fulling, please see mychart message sent by pt's spouse.

## 2022-04-23 ENCOUNTER — Telehealth: Payer: Self-pay | Admitting: Adult Health

## 2022-04-23 NOTE — Telephone Encounter (Signed)
Called his wife and expressed my condolences

## 2022-05-13 DEATH — deceased

## 2022-12-25 IMAGING — CT NM PET TUM IMG INITIAL (PI) SKULL BASE T - THIGH
7 series · 24 of 25 positions shown · non-contrast
Comparison: CT scan 03/30/2021

CLINICAL DATA: Subsequent treatment strategy for bladder
malignancy.

EXAM:
NUCLEAR MEDICINE PET SKULL BASE TO THIGH
TECHNIQUE: 7.4 mCi F-18 FDG was injected intravenously. Full-ring PET imaging
was performed from the skull base to thigh after the radiotracer. CT
data was obtained and used for attenuation correction and anatomic
localization.
Fasting blood glucose: 102 mg/dl

[Series 3: pet sk_thigh ac · axial · 5.0mm · 4.07mm/px · z∈[-1387,-527]mm · 5 of 216 slices shown]
[im 1/216]
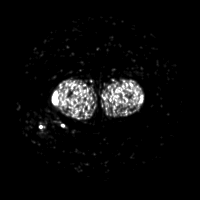
[im 54/216]
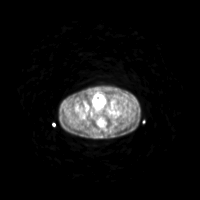
[im 108/216]
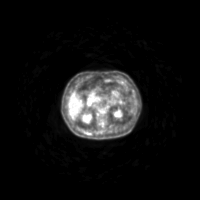
[im 162/216]
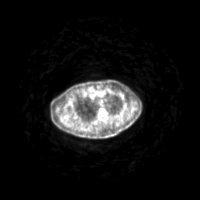
[im 216/216]
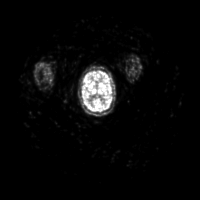

[Series 4: ct sk_thigh 5.0 bf37 · axial · 5.0mm · 0.98mm/px · z∈[-1387,-527]mm · 5 of 216 slices shown]
[im 1/216]
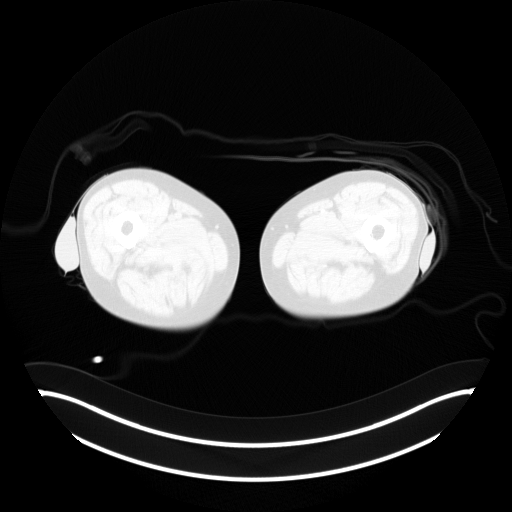
[im 54/216]
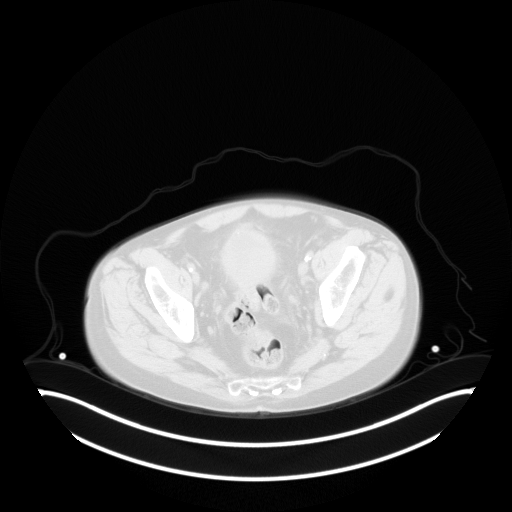
[im 108/216]
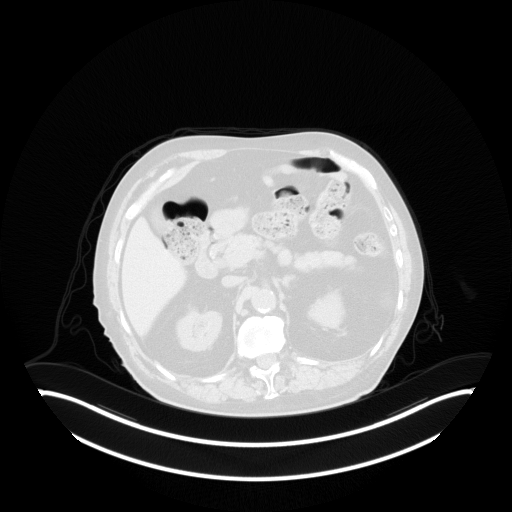
[im 162/216]
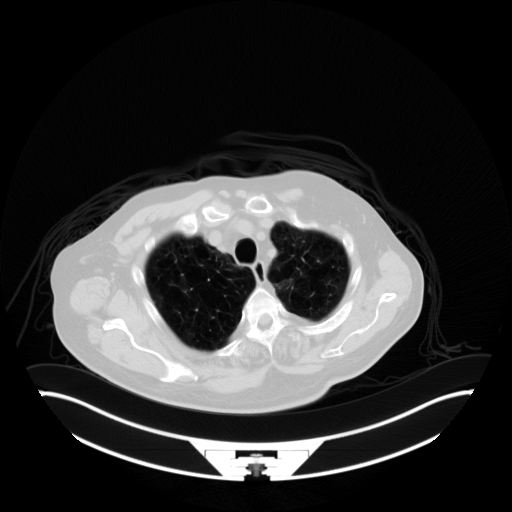
[im 216/216  brain]
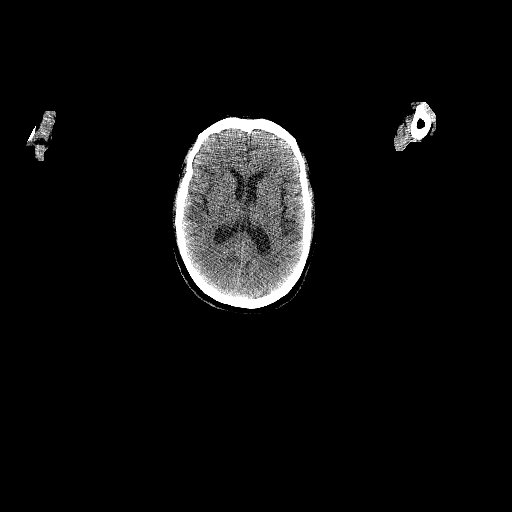

[Series 5: pet sk_thigh nac · axial · 5.0mm · 4.07mm/px · z∈[-1387,-527]mm · 4 of 216 slices shown]
[im 1/216]
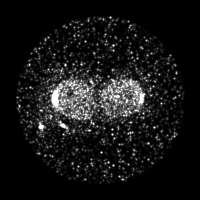
[im 54/216]
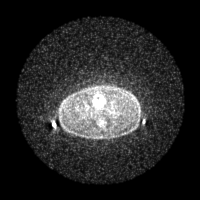
[im 162/216]
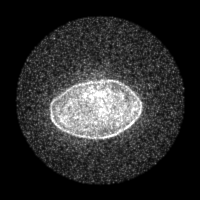
[im 216/216]
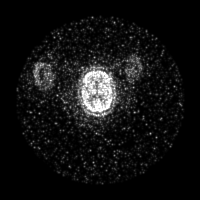

[Series 8: ct sk_thigh 5.0 br59 lung_bone · axial · 5.0mm · 0.61mm/px · z∈[-947,-703]mm · 2 of 62 slices shown]
[im 1/62  brain]
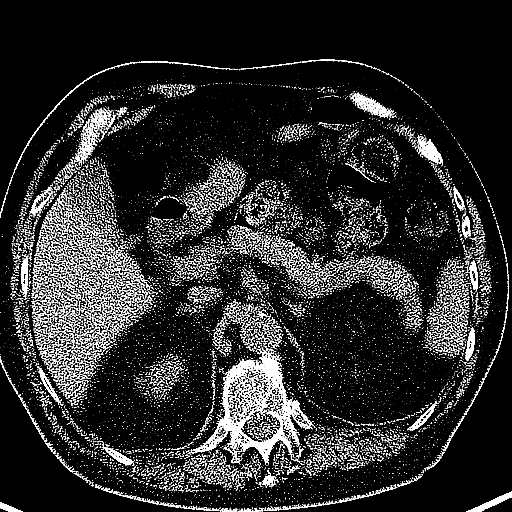
[im 62/62]
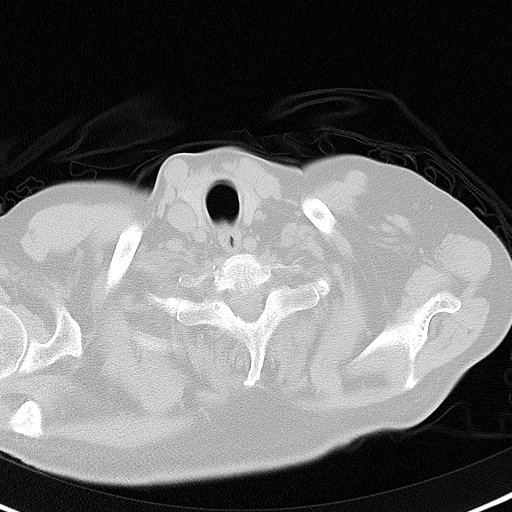

[Series 603: fused cor · 2 of 65 slices shown]
[im 1/65]
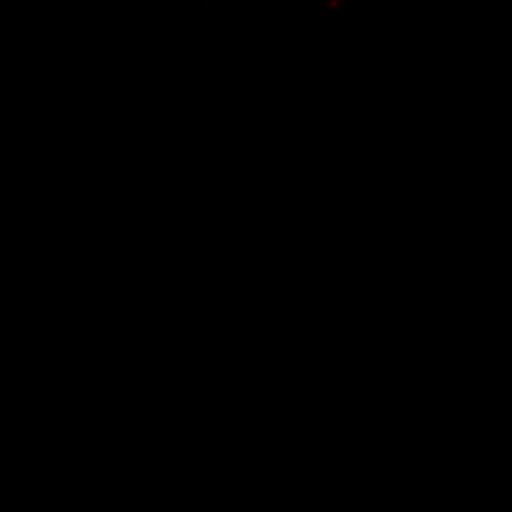
[im 65/65]
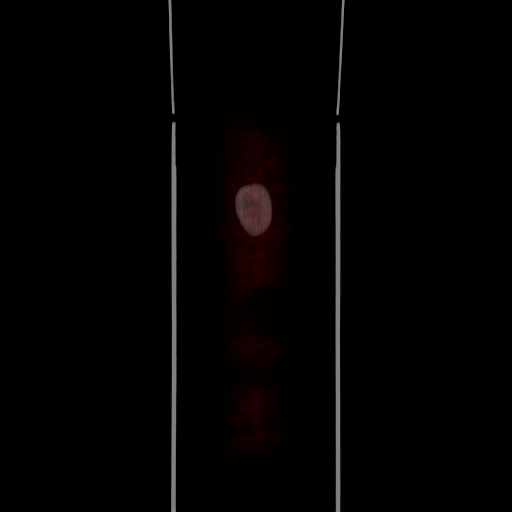

[Series 604: <mip collection> · coronal · 1.79mm/px · 1 of 32 slices shown]
[im 1/32]
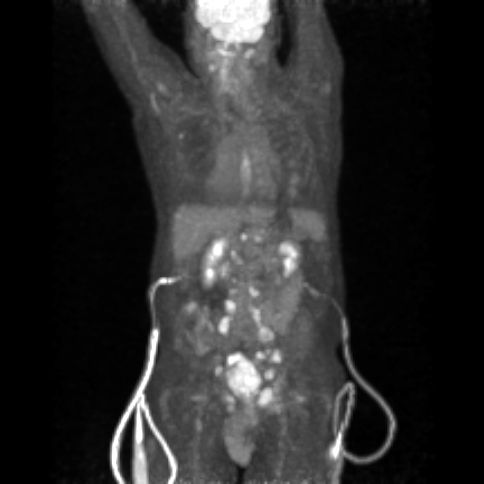

[Series 605: range-ct sk_thigh 5.0 bf37-tra-<alpha range> · 5 of 211 slices shown]
[im 1/211]
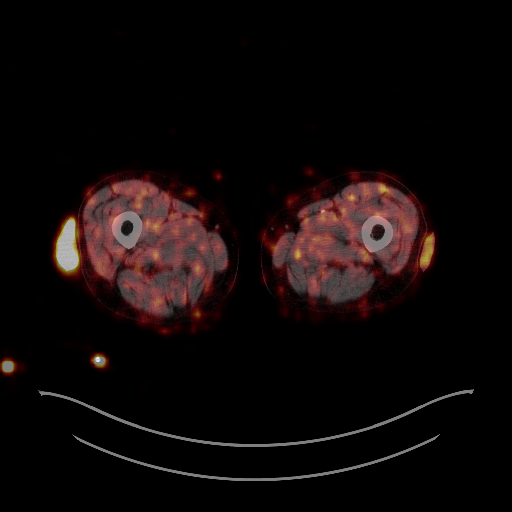
[im 53/211]
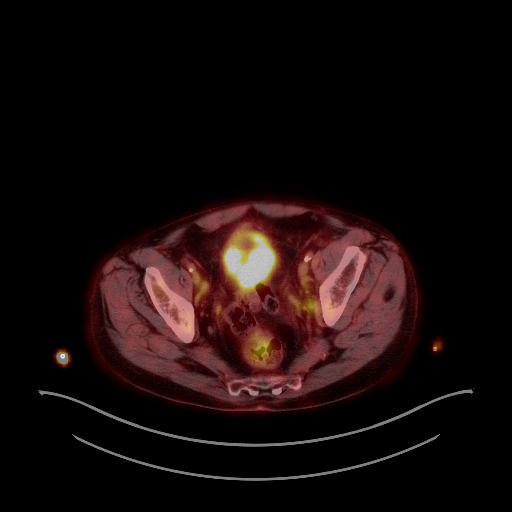
[im 106/211]
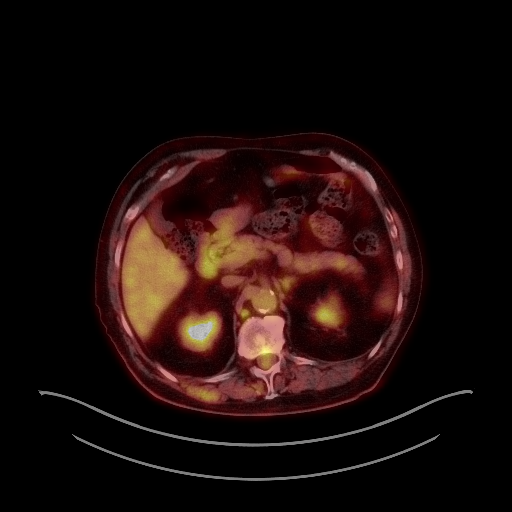
[im 158/211]
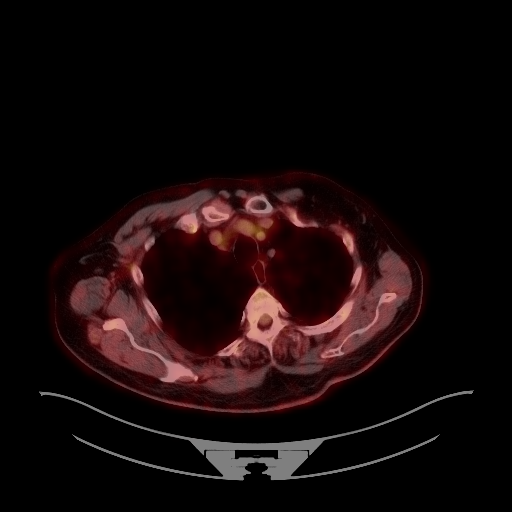
[im 211/211]
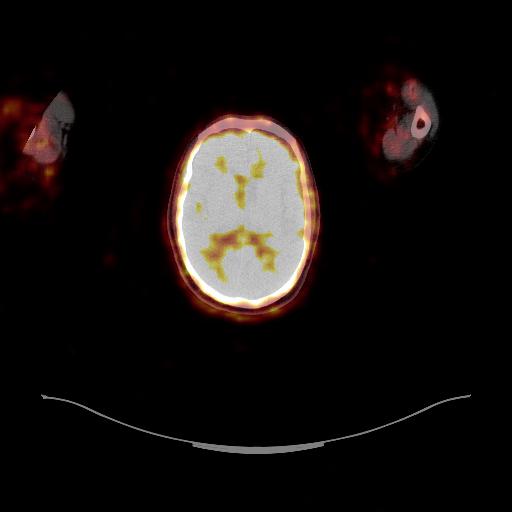

[24 of 25 positions shown; findings below may reference images not displayed]

FINDINGS: Mediastinal blood pool activity: SUV max

Liver activity: SUV max NA

NECK: Physiologic muscular activity in the neck.

Incidental CT findings: Mild atherosclerotic calcification of both
common carotid arteries.

CHEST: The lungs appear clear. Cardiac and mediastinal contours
normal. No pleural effusion identified.

Incidental CT findings: Aplastic left pectoralis musculature with
aplastic anterior left third ribs and below which do not attach to
the sternum. Appearance compatible with [place] syndrome.

Coronary, aortic arch, and branch vessel atherosclerotic vascular
disease. Small type 1 hiatal hernia.

Prominent centrilobular emphysema.  Mild lingular scarring.

ABDOMEN/PELVIS: Extensive hypermetabolic tumor in the urinary
bladder. Representative SUV in the vicinity of bladder tumor with
maximum SUV of 17.4. This is mostly posterior but also extends
around superiorly and inferiorly in the urinary bladder and is also
present in the vicinity of the ureterovesical junctions.

There is hypermetabolic retrocrural, retrocaval, periaortic common
iliac, bilateral external iliac, left internal iliac adenopathy. A
left common iliac node measuring 2.3 cm in short axis on image 149
of series 4 (formerly 0.5 cm) has a maximum SUV of 8.9. A left
pelvic sidewall lymph node measuring 1.6 cm in short axis on image
64 series 3 (formerly 1.1 cm) has a maximum SUV of 11.4.

A fusiform 1.4 by 1.1 cm mass along the right sciatic nerve on image
190 of series 4 a maximum SUV of 3.7, and is not appreciably changed
from 03/30/2021. This is most likely to a schwannoma based on
location and appearance.

Along the sigmoid colon as shown on image 56 of series 605, there is
a focus of abnormal hypermetabolic activity with maximum SUV 11.3.
This seems somewhat high to be inflammatory although there may be
some mild inflammatory stranding along a diverticulum in this
region. An underlying polyp would be a differential diagnostic
consideration.

Accentuated activity along the obturator internus muscles,
especially on the left, likely physiologic given the lack of CT
abnormality.

Incidental CT findings: Bilateral nephrostomies noted. Bilateral
hypodense renal lesions are probably cysts. Atherosclerosis is
present, including aortoiliac atherosclerotic disease. Left gluteus
medius lipoma. Cutaneous ulceration or lesion along the left back at
about the L1 level on image 114 series 4 common no change from
09/27/2020, without substantial hypermetabolic activity.

SKELETON: Focal mild hypermetabolic activity posteriorly in the T12
vertebral body associated with mild focal sclerosis measuring about
0.8 by 0.6 cm on image 107 series 4. This has a maximum SUV of 6.2.

Mild focal activity in the right lateral fourth rib without
underlying CT abnormality, as shown on image 152 series 605, maximum
SUV 3.8.

Incidental CT findings: Costosternal deformities related to [place]
syndrome on the left.
IMPRESSION: 1. Large hypermetabolic bladder mass associated with pelvic and
upper abdominal hypermetabolic adenopathy compatible with metastatic
disease. Adenopathy has worsened compared to 03/30/2021.
2. Small sclerotic lesion posteriorly in the T12 vertebral body has
associated accentuated metabolic activity, bony oligometastatic
disease not excluded.
3. Right lateral fourth rib focal activity without underlying CT
abnormality, significance uncertain.
4. Oval-shaped mass along the right sciatic nerve has mildly
accentuated metabolic activity with maximum SUV 3.7. Most likely
schwannoma based on location and appearance.
5. Other imaging findings of potential clinical significance: Aortic
Atherosclerosis (A6W2I-XRE.E) and Emphysema ([id number]-[id number].R). Absent
left pectoralis musculature and aplastic left anterior ribs
compatible with [place] syndrome. Small type 1 hiatal hernia.
Coronary atherosclerosis. Bilateral nephrostomies. Left gluteus
medius lipoma. Chronic cutaneous ulceration along the left lower
back.

## 2022-12-28 IMAGING — XA IR IMAGING GUIDED PORT INSERTION
1 series · 1 of 1 positions shown · non-contrast
Comparison: none

CLINICAL DATA: BLADDER CANCER, ACCESS FOR CHEMOTHERAPY

[Series 1: ir fluoro/shunt/fist · 1 of 1 slices shown]
[im 1/1]
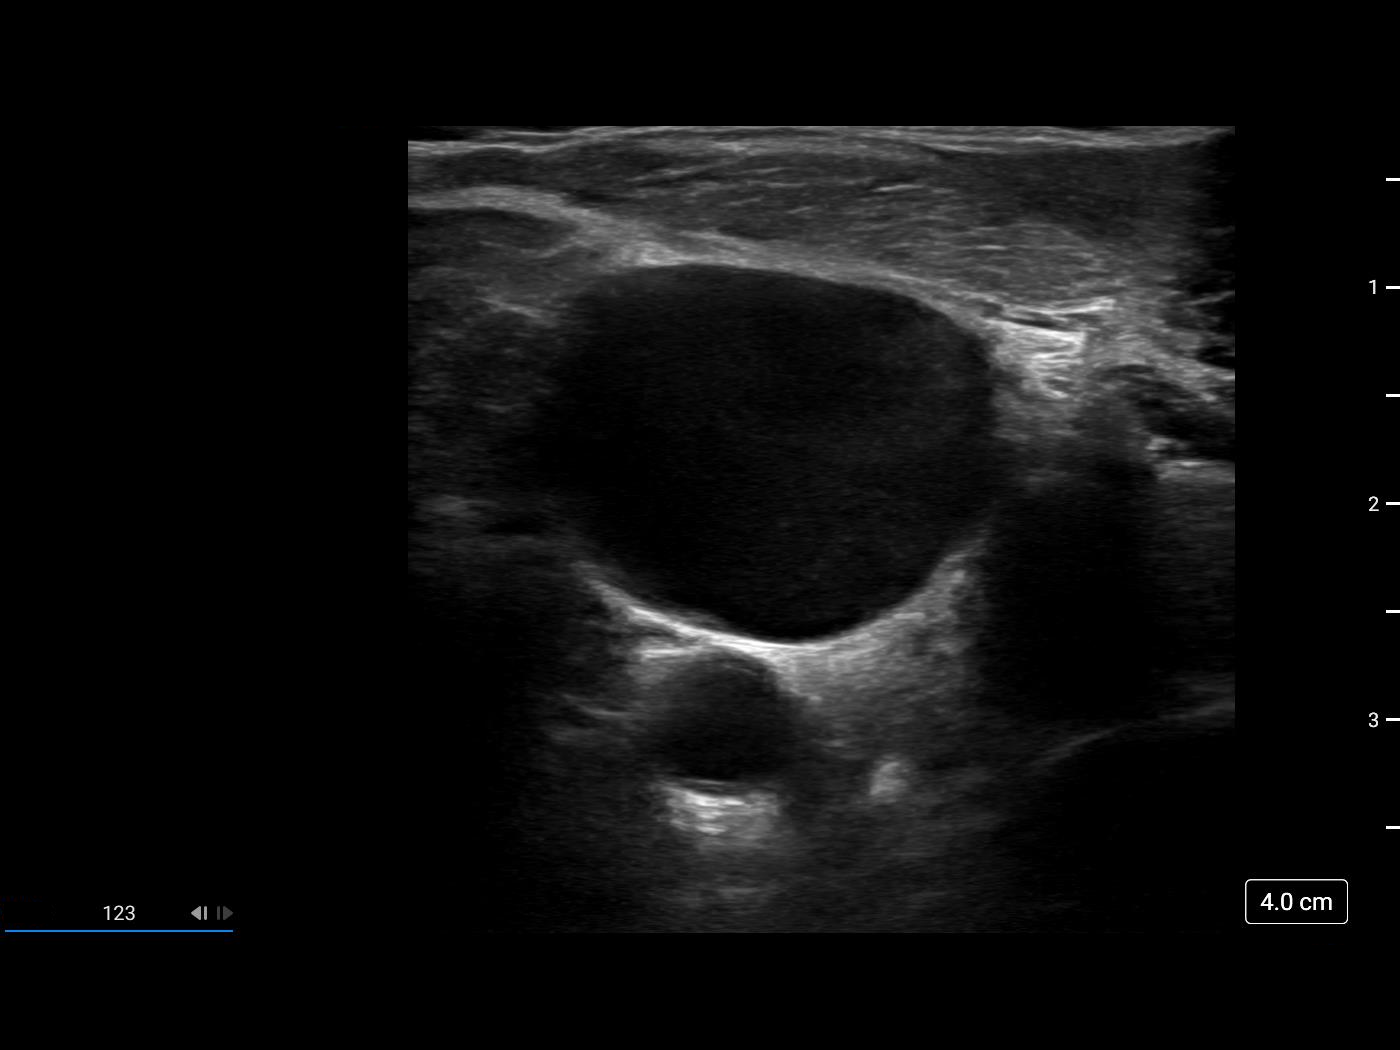

[1 of 1 positions shown; findings below may reference images not displayed]

EXAM:
RIGHT INTERNAL JUGULAR SINGLE LUMEN POWER PORT CATHETER INSERTION

Date:  06/18/2021 06/18/2021 [DATE]

Radiologist:  Sakai, Denisse

Guidance:  Ultrasound and fluoroscopic

MEDICATIONS:
1% lidocaine local with epinephrine

ANESTHESIA/SEDATION:
Versed 3.5 mg IV; Fentanyl 100 mcg IV;

Moderate Sedation Time:  30

The patient was continuously monitored during the procedure by the
interventional radiology nurse under my direct supervision.

FLUOROSCOPY:
One minutes, 24 seconds (6 mGy)

COMPLICATIONS:
None immediate.

CONTRAST:  None.

PROCEDURE:
Informed consent was obtained from the patient following explanation
of the procedure, risks, benefits and alternatives. The patient
understands, agrees and consents for the procedure. All questions
were addressed. A time out was performed.

Maximal barrier sterile technique utilized including caps, mask,
sterile gowns, sterile gloves, large sterile drape, hand hygiene,
and 2% chlorhexidine scrub.

Under sterile conditions and local anesthesia, right internal
jugular micropuncture venous access was performed. Access was
performed with ultrasound. Images were obtained for documentation of
the patent right internal jugular vein. A guide wire was inserted
followed by a transitional dilator. This allowed insertion of a
guide wire and catheter into the IVC. Measurements were obtained
from the SVC / RA junction back to the right IJ venotomy site. In
the right infraclavicular chest, a subcutaneous pocket was created
over the second anterior rib. This was done under sterile conditions
and local anesthesia. 1% lidocaine with epinephrine was utilized for
this. A 2.5 cm incision was made in the skin. Blunt dissection was
performed to create a subcutaneous pocket over the right pectoralis
major muscle. The pocket was flushed with saline vigorously. There
was adequate hemostasis. The port catheter was assembled and checked
for leakage. The port catheter was secured in the pocket with two
retention sutures. The tubing was tunneled subcutaneously to the
right venotomy site and inserted into the SVC/RA junction through a
valved peel-away sheath. Position was confirmed with fluoroscopy.
Images were obtained for documentation. The patient tolerated the
procedure well. No immediate complications. Incisions were closed in
a two layer fashion with 4 - 0 Vicryl suture. Dermabond was applied
to the skin. The port catheter was accessed, blood was aspirated
followed by saline and heparin flushes. Needle was removed. A dry
sterile dressing was applied.
IMPRESSION: Ultrasound and fluoroscopically guided right internal jugular single
lumen power port catheter insertion. Tip in the SVC/RA junction.
Catheter ready for use.

## 2022-12-28 IMAGING — XA IR EXCHANGE NEPHROSTOMY RIGHT
1 series · 6 of 6 positions shown · non-contrast
Comparison: 03/30/2021

INDICATION: Bladder cancer, chronic indwelling nephrostomy

EXAM:
FLUOROSCOPIC EXCHANGE OF THE BILATERAL NEPHROSTOMIES

[2d screen save: ir nephrostomy exchange  · 6 of 6 slices shown]
[im 1/6]
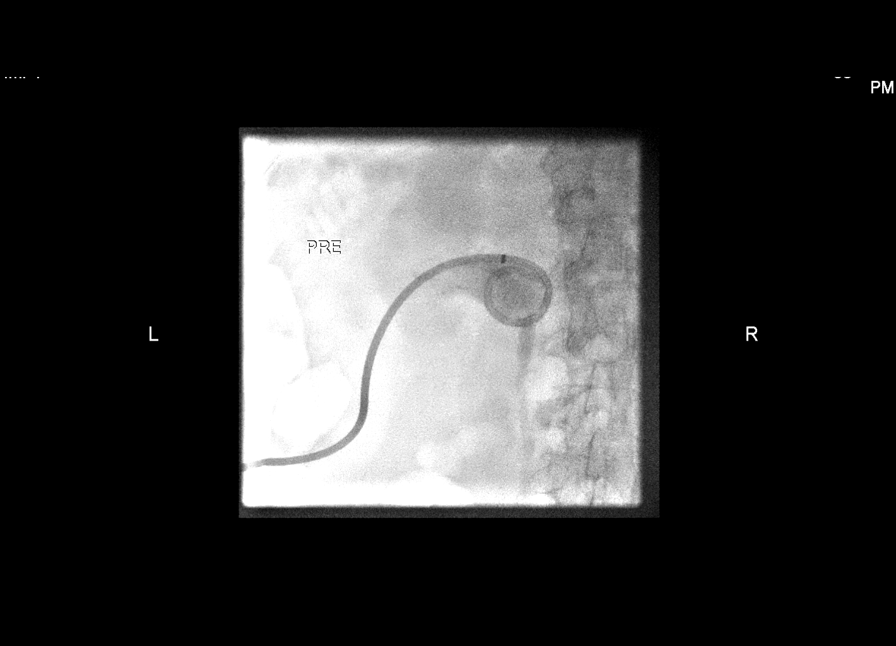
[im 2/6]
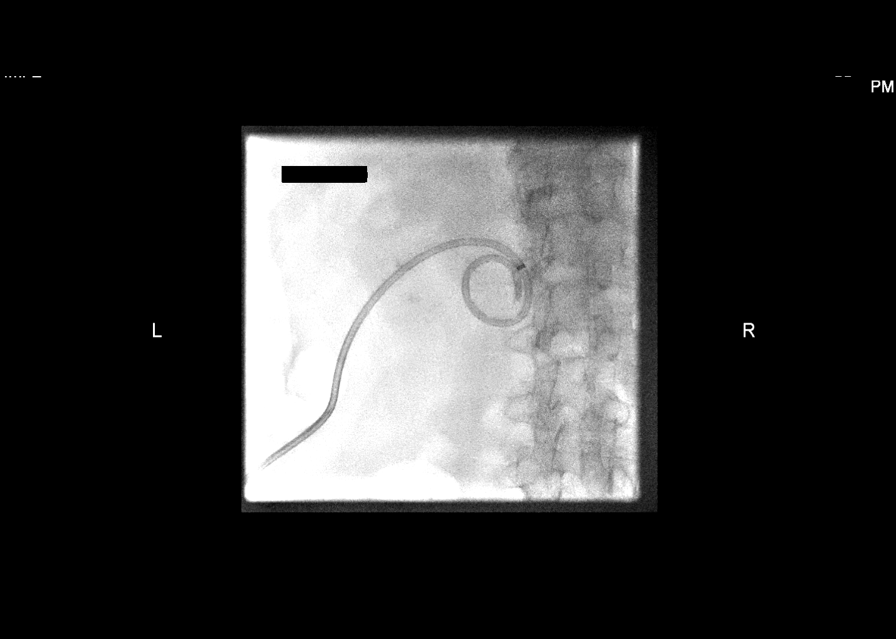
[im 3/6]
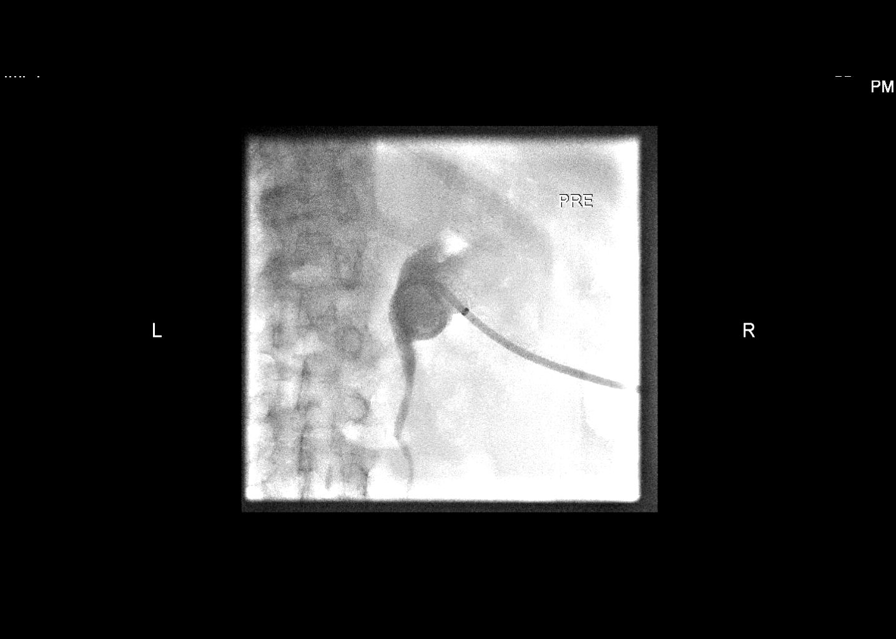
[im 4/6]
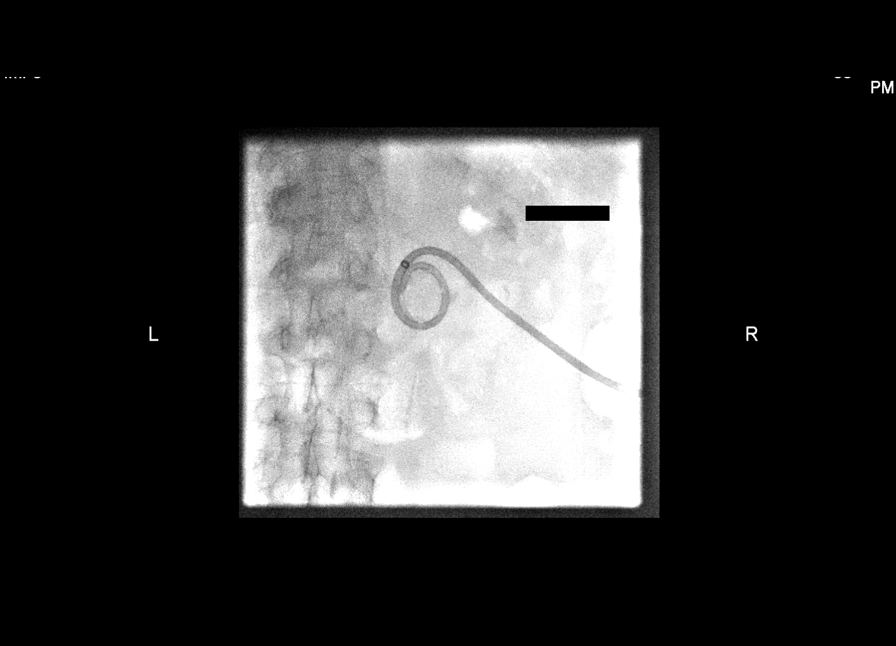
[im 5/6]
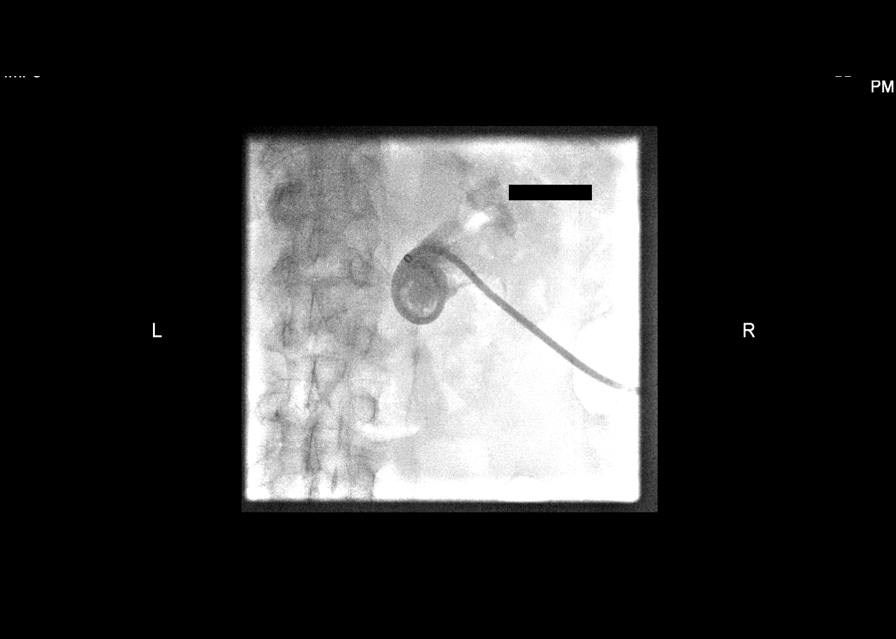
[im 6/6]
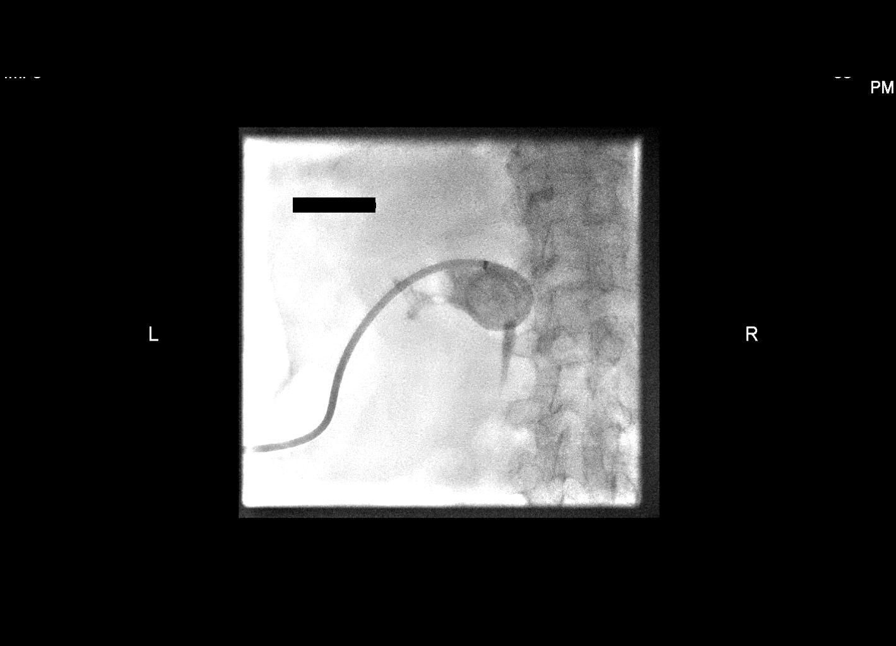

[6 of 6 positions shown; findings below may reference images not displayed]

MEDICATIONS:
1% LIDOCAINE LOCAL

ANESTHESIA/SEDATION:
Moderate (conscious) sedation was employed during this procedure. A
total of Versed 1 mg and Fentanyl 50 mcg was administered
intravenously by the radiology nurse.

Total intra-service moderate Sedation Time: 10 minutes. The
patient's level of consciousness and vital signs were monitored
continuously by radiology nursing throughout the procedure under my
direct supervision.

CONTRAST:  Ten-administered into the collecting system(s)

FLUOROSCOPY:
Radiation Exposure Index (as provided by the fluoroscopic device):
6.8 mGy Kerma

COMPLICATIONS:
None immediate.

PROCEDURE:
Informed written consent was obtained from the patient after a
thorough discussion of the procedural risks, benefits and
alternatives. All questions were addressed. Maximal Sterile Barrier
Technique was utilized including caps, mask, sterile gowns, sterile
gloves, sterile drape, hand hygiene and skin antiseptic. A timeout
was performed prior to the initiation of the procedure.

Under sterile conditions and local anesthesia, the existing
bilateral 10 French nephrostomies were exchanged over Amplatz
guidewires. Retention loops formed the renal pelvis bilaterally.
Contrast injection confirms position. Images obtained for
documentation of the exchanges. Catheters secured with Prolene
sutures and connected to external gravity drainage bags. No
immediate complication. Patient tolerated the procedure well.
IMPRESSION: Successful bilateral 10 French nephrostomy exchanges
